# Patient Record
Sex: Male | Born: 1965 | Race: White | Hispanic: No | Marital: Married | Smoking: Never smoker
Health system: Southern US, Community
[De-identification: ages and names within clinical notes are randomized; demographics above are authoritative.]

## PROBLEM LIST (undated history)

## (undated) DIAGNOSIS — E119 Type 2 diabetes mellitus without complications: Secondary | ICD-10-CM

## (undated) HISTORY — PX: FINGER AMPUTATION: SHX636

## (undated) HISTORY — DX: Type 2 diabetes mellitus without complications: E11.9

---

## 2018-03-26 ENCOUNTER — Emergency Department (HOSPITAL_COMMUNITY)
Admission: EM | Admit: 2018-03-26 | Discharge: 2018-03-26 | Disposition: A | Discharge status: Home / Self Care | Payer: BC Managed Care – PPO | Attending: Emergency Medicine | Admitting: Emergency Medicine

## 2018-03-26 ENCOUNTER — Encounter (HOSPITAL_COMMUNITY): Payer: Self-pay | Admitting: Emergency Medicine

## 2018-03-26 ENCOUNTER — Emergency Department (HOSPITAL_COMMUNITY): Payer: BC Managed Care – PPO

## 2018-03-26 ENCOUNTER — Other Ambulatory Visit: Payer: Self-pay

## 2018-03-26 DIAGNOSIS — J069 Acute upper respiratory infection, unspecified: Secondary | ICD-10-CM | POA: Insufficient documentation

## 2018-03-26 DIAGNOSIS — R000 Tachycardia, unspecified: Secondary | ICD-10-CM | POA: Diagnosis not present

## 2018-03-26 DIAGNOSIS — R05 Cough: Secondary | ICD-10-CM | POA: Diagnosis not present

## 2018-03-26 LAB — CBC WITH DIFFERENTIAL/PLATELET
BASOS ABS: 0 10*3/uL (ref 0.0–0.1)
BASOS PCT: 0 %
Eosinophils Absolute: 0.1 10*3/uL (ref 0.0–0.7)
Eosinophils Relative: 1 %
HCT: 45.9 % (ref 39.0–52.0)
Hemoglobin: 15.5 g/dL (ref 13.0–17.0)
LYMPHS PCT: 8 %
Lymphs Abs: 0.6 10*3/uL — ABNORMAL LOW (ref 0.7–4.0)
MCH: 30.2 pg (ref 26.0–34.0)
MCHC: 33.8 g/dL (ref 30.0–36.0)
MCV: 89.5 fL (ref 78.0–100.0)
MONO ABS: 0.2 10*3/uL (ref 0.1–1.0)
MONOS PCT: 2 %
Neutro Abs: 6.7 10*3/uL (ref 1.7–7.7)
Neutrophils Relative %: 89 %
Platelets: 262 10*3/uL (ref 150–400)
RBC: 5.13 MIL/uL (ref 4.22–5.81)
RDW: 12 % (ref 11.5–15.5)
WBC: 7.6 10*3/uL (ref 4.0–10.5)

## 2018-03-26 LAB — COMPREHENSIVE METABOLIC PANEL
ALBUMIN: 4.6 g/dL (ref 3.5–5.0)
ALK PHOS: 103 U/L (ref 38–126)
ALT: 34 U/L (ref 17–63)
AST: 25 U/L (ref 15–41)
Anion gap: 11 (ref 5–15)
BILIRUBIN TOTAL: 1.3 mg/dL — AB (ref 0.3–1.2)
BUN: 18 mg/dL (ref 6–20)
CO2: 24 mmol/L (ref 22–32)
Calcium: 9.7 mg/dL (ref 8.9–10.3)
Chloride: 101 mmol/L (ref 101–111)
Creatinine, Ser: 1.21 mg/dL (ref 0.61–1.24)
GFR calc Af Amer: >60 mL/min (ref >60)
GFR calc non Af Amer: >60 mL/min (ref >60)
GLUCOSE: 183 mg/dL — AB (ref 65–99)
Potassium: 4.2 mmol/L (ref 3.5–5.1)
Sodium: 136 mmol/L (ref 135–145)
TOTAL PROTEIN: 8.3 g/dL — AB (ref 6.5–8.1)

## 2018-03-26 LAB — TROPONIN I: Troponin I: <0.03 ng/mL (ref <0.03)

## 2018-03-26 MED ORDER — SODIUM CHLORIDE 0.9 % IV BOLUS
1000.0000 mL | Freq: Once | INTRAVENOUS | Status: AC
  Administered 2018-03-26: 1000 mL via INTRAVENOUS

## 2018-03-26 MED ORDER — ACETAMINOPHEN 500 MG PO TABS
1000.0000 mg | ORAL_TABLET | Freq: Once | ORAL | Status: AC
  Administered 2018-03-26: 1000 mg via ORAL
  Filled 2018-03-26: qty 2

## 2018-03-26 MED ORDER — BENZONATATE 100 MG PO CAPS: 100.0000 mg | ORAL_CAPSULE | Freq: Three times a day (TID) | ORAL | 0 refills | Status: DC | PRN

## 2018-03-26 NOTE — Discharge Instructions (Signed)

## 2018-03-26 NOTE — ED Provider Notes (Signed)
Emergency Department Provider Note   I have reviewed the triage vital signs and the nursing notes.   HISTORY  Chief Complaint Tachycardia and Cough   HPI David [name] is a 52 y.o. male presents to the emergency department for evaluation of cough, congestion, and persistent elevated heart rate.  He has had symptoms for the past 4 days.  He is been taking over-the-counter medications including Sudafed and DayQuil with only intermittent relief in symptoms.  He went to see his workplace provider who referred him to the emergency department.  He went to see his PCP Dr. Juanetta Gosling who also referred him to the emergency department.  He denies any dyspnea, shaking chills, chest pain, abdominal pain, vomiting, diarrhea.  No hemoptysis. No radiation of symptoms or modifying factors.   History reviewed. No pertinent past medical history.  There are no active problems to display for this patient.   Past Surgical History:  Procedure Laterality Date  . FINGER AMPUTATION        Allergies Patient has no known allergies.  History reviewed. No pertinent family history.  Social History Social History   Tobacco Use  . Smoking status: Never Smoker  . Smokeless tobacco: Never Used  Substance Use Topics  . Alcohol use: Not Currently    Frequency: Never  . Drug use: Never    Review of Systems  Constitutional: No fever/chills Eyes: No visual changes. ENT: Positive sore throat. Positive cough and congestion.  Cardiovascular: Denies chest pain. Respiratory: Denies shortness of breath. Gastrointestinal: No abdominal pain.  No nausea, no vomiting.  No diarrhea.  No constipation. Genitourinary: Negative for dysuria. Musculoskeletal: Negative for back pain. Skin: Negative for rash. Neurological: Negative for headaches, focal weakness or numbness.  10-point ROS otherwise negative.  ____________________________________________   PHYSICAL EXAM:  VITAL SIGNS: ED Triage Vitals  Enc  Vitals Group     BP 03/26/18 1621 (!) 178/96     Pulse Rate 03/26/18 1621 (!) 126     Resp 03/26/18 1621 18     Temp 03/26/18 1621 99.3 F (37.4 C)     Temp Source 03/26/18 1621 Oral     SpO2 03/26/18 1621 95 %     Weight 03/26/18 1621 184 lb (83.5 kg)     Height 03/26/18 1621 5\' 8"  (1.727 m)     Pain Score 03/26/18 1623 4   Constitutional: Alert and oriented. Well appearing and in no acute distress. Eyes: Conjunctivae are normal. Head: Atraumatic. Nose: No congestion/rhinnorhea. Mouth/Throat: Mucous membranes are moist.  Neck: No stridor.  Cardiovascular: Sinus tachcyardia. Good peripheral circulation. Grossly normal heart sounds.   Respiratory: Normal respiratory effort.  No retractions. Lungs CTAB. Gastrointestinal: Soft and nontender. No distention.  Musculoskeletal: No lower extremity tenderness nor edema. No gross deformities of extremities. Neurologic:  Normal speech and language. No gross focal neurologic deficits are appreciated.  Skin:  Skin is warm, dry and intact. No rash noted.  ____________________________________________   LABS (all labs ordered are listed, but only abnormal results are displayed)  Labs Reviewed  COMPREHENSIVE METABOLIC PANEL - Abnormal; Notable for the following components:      Result Value   Glucose, Bld 183 (*)    Total Protein 8.3 (*)    Total Bilirubin 1.3 (*)    All other components within normal limits  CBC WITH DIFFERENTIAL/PLATELET - Abnormal; Notable for the following components:   Lymphs Abs 0.6 (*)    All other components within normal limits  TROPONIN I   ____________________________________________  EKG   EKG Interpretation  Date/Time:  Wednesday March 26 2018 16:30:38 EDT Ventricular Rate:  110 PR Interval:    QRS Duration: 99 QT Interval:  308 QTC Calculation: 417 R Axis:   61 Text Interpretation:  Sinus tachycardia No STEMI.  Confirmed by Alona BeneLong, Demetre Monaco [phone]([id number]) on 03/26/2018 4:32:41 PM        ____________________________________________  RADIOLOGY  Dg Chest 2 View  Result Date: 03/26/2018 CLINICAL DATA:  Tachycardia, productive yellow/green sputum cough with body aches and generalized malaise and low grade fevers x 3 days. Denies sob or chest pain.No prior history of heart or lung conditions. EXAM: CHEST - 2 VIEW COMPARISON:  None. FINDINGS: The heart size and mediastinal contours are within normal limits. Both lungs are clear. No pleural effusion or pneumothorax. The visualized skeletal structures are unremarkable. IMPRESSION: No active cardiopulmonary disease. Electronically Signed   By: Amie Portlandavid  Ormond M.D.   On: 03/26/2018 17:07    ____________________________________________   PROCEDURES  Procedure(s) performed:   Procedures  None ____________________________________________   INITIAL IMPRESSION / ASSESSMENT AND PLAN / ED COURSE  Pertinent labs & imaging results that were available during my care of the patient were reviewed by me and considered in my medical decision making (see chart for details).  Patient presents to the emergency department for evaluation of upper respiratory tract infection symptoms with persistent tachycardia.  He has been taking Sudafed and DayQuil for the past 3 days which may be contributing to his tachycardia.  He shows no signs of volume overload or heart findings to suggest myocarditis.  No chest pain.  EKG reviewed with no acute findings.  Plan for chest x-ray and lab work along with IV fluids. Afebrile here.   05:32 PM Labs and CXR reviewed with no acute findings. Troponin negative. No clinical concern for myocarditis. Plan to have the patient decrease his OTC medication with psuedophed and f/u with PCP.   At this time, I do not feel there is any life-threatening condition present. I have reviewed and discussed all results (EKG, imaging, lab, urine as appropriate), exam findings with patient. I have reviewed nursing notes and  appropriate previous records.  I feel the patient is safe to be discharged home without further emergent workup. Discussed usual and customary return precautions. Patient and family (if present) verbalize understanding and are comfortable with this plan.  Patient will follow-up with their primary care provider. If they do not have a primary care provider, information for follow-up has been provided to them. All questions have been answered.  ____________________________________________  FINAL CLINICAL IMPRESSION(S) / ED DIAGNOSES  Final diagnoses:  Tachycardia  Viral upper respiratory tract infection     MEDICATIONS GIVEN DURING THIS VISIT:  Medications  sodium chloride 0.9 % bolus 1,000 mL (1,000 mLs Intravenous New Bag/Given 03/26/18 1643)  acetaminophen (TYLENOL) tablet 1,000 mg (1,000 mg Oral Given 03/26/18 1643)     NEW OUTPATIENT MEDICATIONS STARTED DURING THIS VISIT:  New Prescriptions   BENZONATATE (TESSALON) 100 MG CAPSULE    Take 1 capsule (100 mg total) by mouth 3 (three) times daily as needed for cough.    Note:  This document was prepared using Dragon voice recognition software and may include unintentional dictation errors.  Alona BeneJoshua Karver Fadden, MD Emergency Medicine    Richardo Popoff, Arlyss RepressJoshua G, MD 03/26/18 [phone]

## 2018-03-26 NOTE — ED Triage Notes (Signed)
PT c/o productive yellow/green sputum cough with body aches and generalized malaise and low grade fevers. PT states he was seen by her work MD today and then Dr. Juanetta GoslingHawkins and was told to come to ED for eval due to tachycardia.

## 2019-08-04 LAB — BASIC METABOLIC PANEL: Glucose: 120

## 2019-08-04 LAB — HEPATIC FUNCTION PANEL
ALT: 22 (ref 10–40)
AST: 17 (ref 14–40)
Alkaline Phosphatase: 80 (ref 25–125)

## 2019-08-04 LAB — PSA: PSA: 2

## 2019-08-07 LAB — LIPID PANEL
Cholesterol: 217 — AB (ref 0–200)
LDL Cholesterol: 151
Triglycerides: 136 (ref 40–160)

## 2019-08-26 ENCOUNTER — Ambulatory Visit (INDEPENDENT_AMBULATORY_CARE_PROVIDER_SITE_OTHER): Payer: PRIVATE HEALTH INSURANCE | Admitting: Family Medicine

## 2019-08-26 ENCOUNTER — Other Ambulatory Visit: Payer: Self-pay

## 2019-08-26 VITALS — BP 132/87 | HR 82 | Temp 98.1°F | Ht 68.0 in | Wt 182.2 lb

## 2019-08-26 DIAGNOSIS — G4709 Other insomnia: Secondary | ICD-10-CM

## 2019-08-26 DIAGNOSIS — R7309 Other abnormal glucose: Secondary | ICD-10-CM

## 2019-08-26 DIAGNOSIS — Z23 Encounter for immunization: Secondary | ICD-10-CM

## 2019-08-26 LAB — POCT GLYCOSYLATED HEMOGLOBIN (HGB A1C): Hemoglobin A1C: 5.9 % — AB (ref 4.0–5.6)

## 2019-08-26 NOTE — Patient Instructions (Signed)

## 2019-08-26 NOTE — Progress Notes (Signed)
New Patient Office Visit  Subjective:  Patient ID: David [name], male    DOB: 08-17-1966  Age: 53 y.o. MRN: [id number]  CC:  Chief Complaint  Patient presents with  . New Patient (Initial Visit)    HPI David [name] presents for  cologuard-07/2019 PSA 2.0 normal Elevated PSA in the past with biopsy-normal  Past Surgical History:  Procedure Laterality Date  . FINGER AMPUTATION    right finger  FH -father hyperlipidemia  Social History  Live with wife and son( in college)), 2 other adult children Police officer-retired [employer] Socioeconomic History  . Marital status: Married    Spouse name: Not on file  . Number of children: Not on file  . Years of education: Not on file  . Highest education level: Not on file  Occupational History  . Not on file  Social Needs  . Financial resource strain: Not on file  . Food insecurity    Worry: Not on file    Inability: Not on file  . Transportation needs    Medical: Not on file    Non-medical: Not on file  Tobacco Use  . Smoking status: Never Smoker  . Smokeless tobacco: Never Used  Substance and Sexual Activity  . Alcohol use: Not Currently    Frequency: Never  . Drug use: Never  . Sexual activity: Not on file  Lifestyle  . Physical activity    Days per week: Not on file    Minutes per session: Not on file  . Stress: Not on file  Relationships  . Social Herbalist on phone: Not on file    Gets together: Not on file    Attends religious service: Not on file    Active member of club or organization: Not on file    Attends meetings of clubs or organizations: Not on file    Relationship status: Not on file  . Intimate partner violence    Fear of current or ex partner: Not on file    Emotionally abused: Not on file    Physically abused: Not on file    Forced sexual activity: Not on file  Other Topics Concern  . Not on file  Social History Narrative  . Not on file    ROS Review of Systems   HENT: Negative.   Eyes: Negative.   Respiratory: Negative.   Cardiovascular: Negative.   Endocrine: Negative.   Genitourinary:       Elevated psa  Musculoskeletal:       Back pain  Psychiatric/Behavioral: Positive for sleep disturbance.       Occasionally uses ambien    Objective:   Today's Vitals: BP 132/87 (BP Location: Left Arm, Patient Position: Sitting, Cuff Size: Normal)   Pulse 82   Temp 98.1 F (36.7 C) (Oral)   Ht 5\' 8"  (1.727 m)   Wt 182 lb 3.2 oz (82.6 kg)   SpO2 97%   BMI 27.70 kg/m   Physical Exam Constitutional:      Appearance: Normal appearance.  HENT:     Head: Normocephalic and atraumatic.     Right Ear: Tympanic membrane normal.     Left Ear: Tympanic membrane normal.     Nose: Nose normal.  Eyes:     Conjunctiva/sclera: Conjunctivae normal.  Neck:     Musculoskeletal: Normal range of motion and neck supple.  Cardiovascular:     Rate and Rhythm: Normal rate and regular rhythm.     Pulses:  Normal pulses.     Heart sounds: Normal heart sounds.  Pulmonary:     Effort: Pulmonary effort is normal.     Breath sounds: Normal breath sounds.  Abdominal:     General: Abdomen is flat.  Neurological:     General: No focal deficit present.  Psychiatric:        Mood and Affect: Mood normal.        Behavior: Behavior normal.     Assessment & Plan:   1. Elevated glucose - POCT HgB A1C  2. Other insomnia ambien prn 3. Need for immunization against influenza  - Flu Vaccine QUAD 36+ mos IM Outpatient Encounter Medications as of 08/26/2019  Medication Sig  . baclofen (LIORESAL) 10 MG tablet Take 10 mg by mouth 3 (three) times daily.  Marland Kitchen. ibuprofen (ADVIL) 800 MG tablet Take 800 mg by mouth every 8 (eight) hours as needed.  . zolpidem (AMBIEN) 5 MG tablet Take 5 mg by mouth at bedtime as needed for sleep.  . [DISCONTINUED] benzonatate (TESSALON) 100 MG capsule Take 1 capsule (100 mg total) by mouth 3 (three) times daily as needed for cough. (Patient  not taking: Reported on 08/26/2019)   No facility-administered encounter medications on file as of 08/26/2019.    Follow-up:   Jonni Oelkers Mat CarneLEIGH Brylan Dec, MD

## 2019-10-08 IMAGING — DX DG CHEST 2V
2 series · 2 of 2 positions shown · non-contrast
Comparison: None.

CLINICAL DATA: Tachycardia, productive yellow/green sputum cough
with body aches and generalized malaise and low grade fevers x 3
days. Denies sob or chest pain.No prior history of heart or lung
conditions.

EXAM:
CHEST - 2 VIEW

[chest pa]
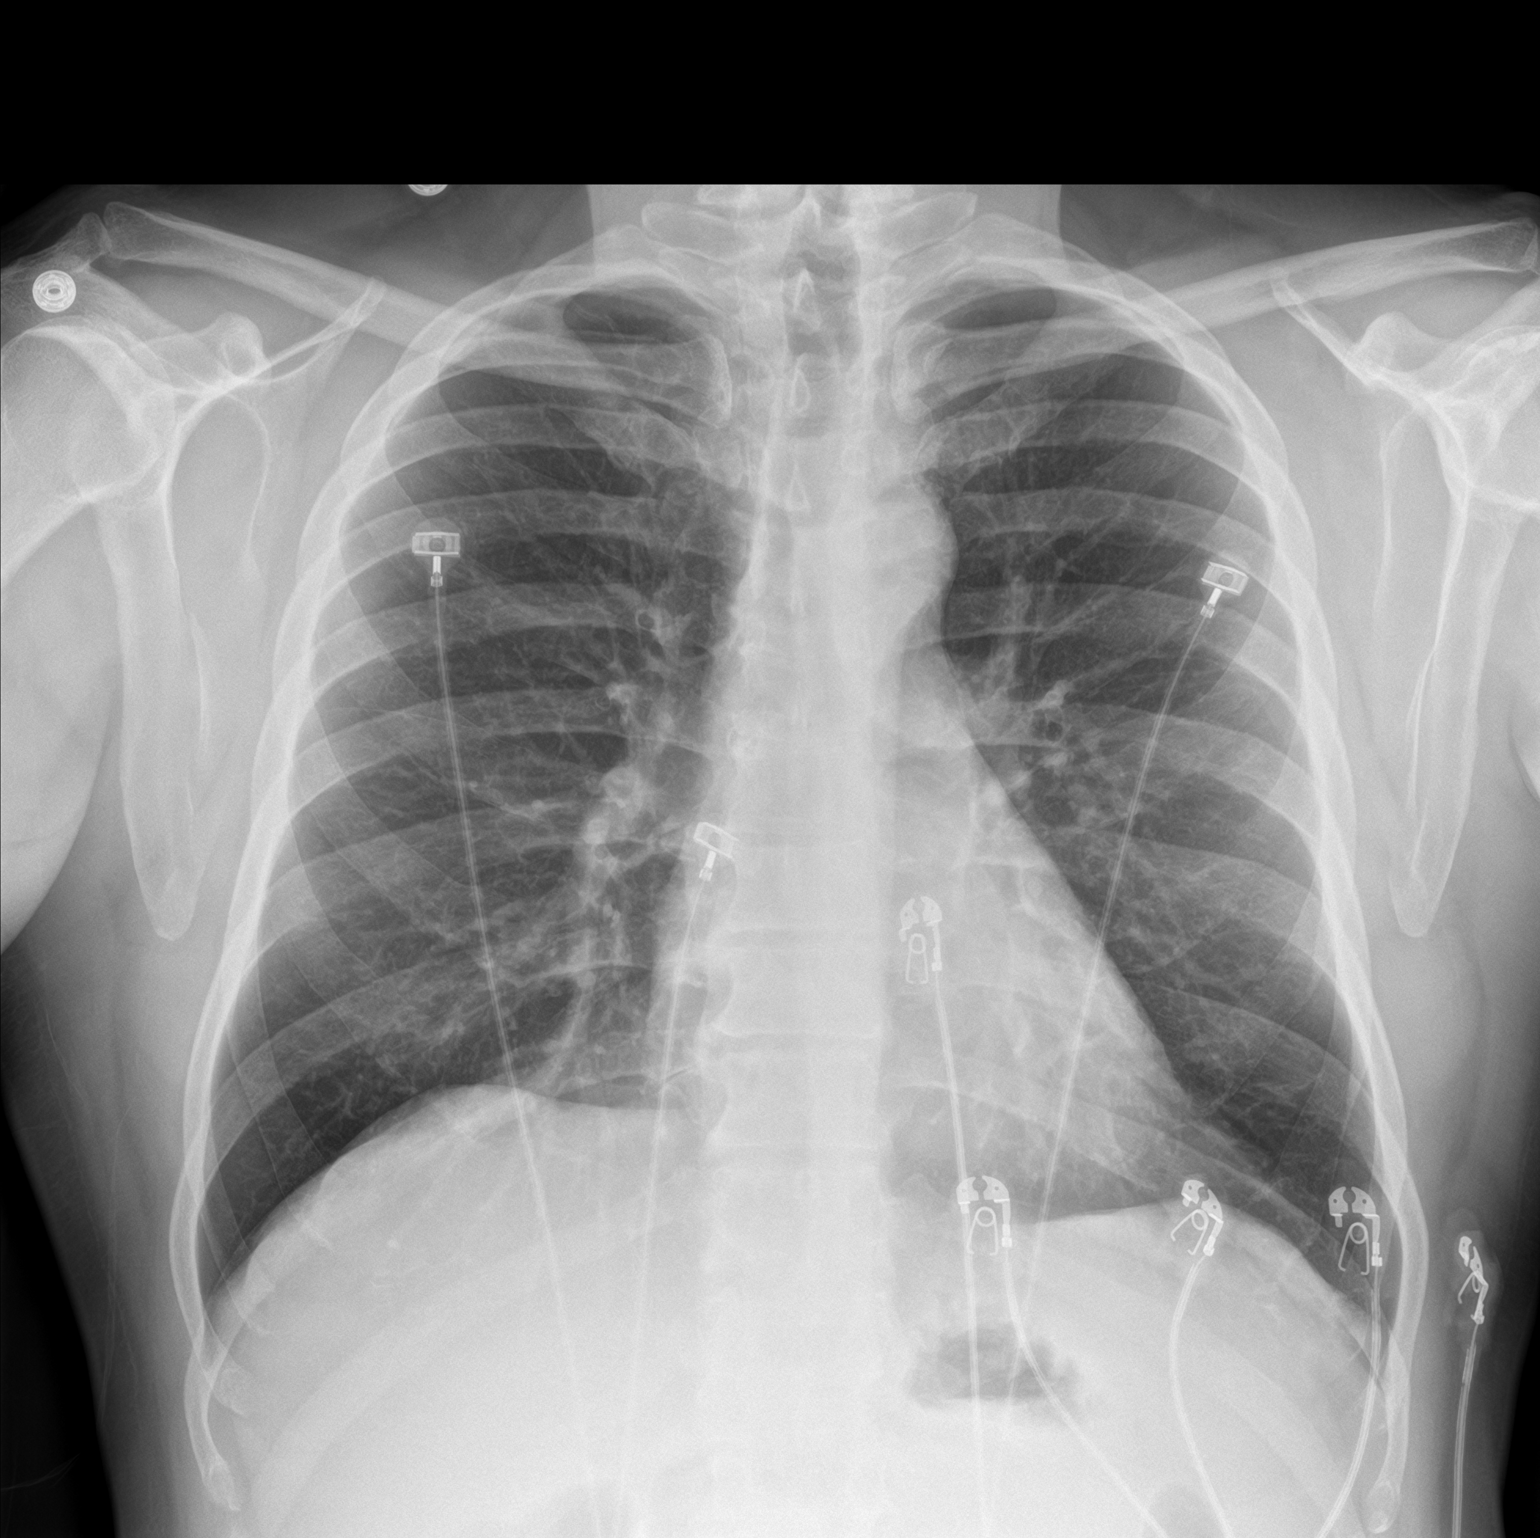

[chest lat]
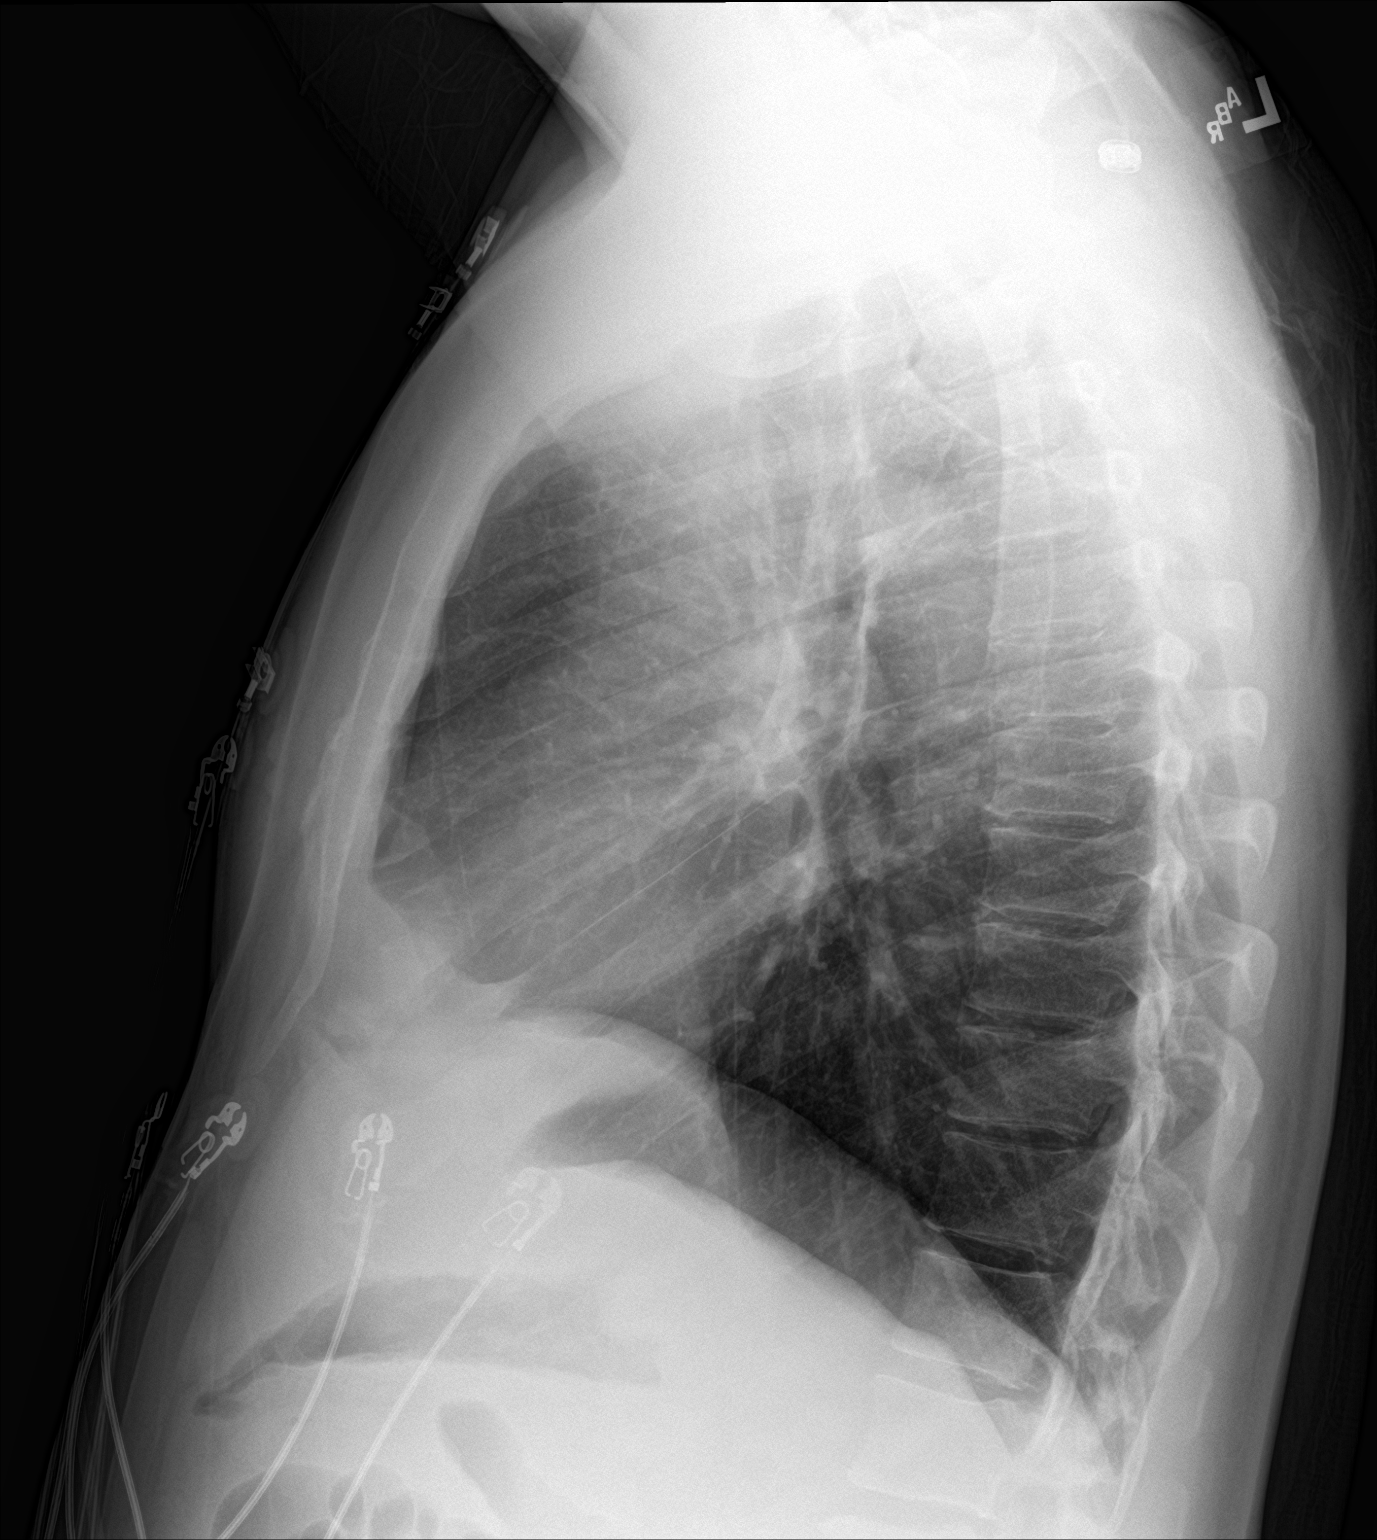

[2 of 2 positions shown; findings below may reference images not displayed]

FINDINGS: The heart size and mediastinal contours are within normal limits.
Both lungs are clear. No pleural effusion or pneumothorax. The
visualized skeletal structures are unremarkable.
IMPRESSION: No active cardiopulmonary disease.

## 2020-02-23 ENCOUNTER — Ambulatory Visit: Payer: BC Managed Care – PPO | Admitting: Family Medicine

## 2021-02-15 ENCOUNTER — Other Ambulatory Visit: Payer: Self-pay | Admitting: *Deleted

## 2021-02-15 DIAGNOSIS — Z1231 Encounter for screening mammogram for malignant neoplasm of breast: Secondary | ICD-10-CM

## 2021-02-27 ENCOUNTER — Other Ambulatory Visit: Payer: Self-pay

## 2021-02-27 DIAGNOSIS — Z125 Encounter for screening for malignant neoplasm of prostate: Secondary | ICD-10-CM

## 2021-02-28 ENCOUNTER — Other Ambulatory Visit: Payer: Self-pay

## 2021-02-28 ENCOUNTER — Other Ambulatory Visit: Payer: Self-pay | Admitting: *Deleted

## 2021-02-28 DIAGNOSIS — Z125 Encounter for screening for malignant neoplasm of prostate: Secondary | ICD-10-CM

## 2021-03-01 ENCOUNTER — Telehealth: Payer: Self-pay

## 2021-03-01 LAB — PSA: Prostate Specific Ag, Serum: 1.9 ng/mL

## 2021-03-02 ENCOUNTER — Telehealth: Payer: Self-pay

## 2023-02-27 ENCOUNTER — Encounter: Payer: Self-pay | Admitting: *Deleted

## 2023-03-07 ENCOUNTER — Telehealth (INDEPENDENT_AMBULATORY_CARE_PROVIDER_SITE_OTHER): Payer: Self-pay | Admitting: *Deleted

## 2023-03-07 NOTE — Telephone Encounter (Signed)
  Procedure: Colonoscopy  Height: 5'8 Weight: 158lbs       Have you had a colonoscopy before?  no  Do you have family history of colon cancer?  no  Do you have a family history of polyps? no  Previous colonoscopy with polyps removed? no  Do you have a history colorectal cancer?   no  Are you diabetic?  Yes, type 2  Do you have a prosthetic or mechanical heart valve? no  Do you have a pacemaker/defibrillator?   no  Have you had endocarditis/atrial fibrillation?  no  Do you use supplemental oxygen/CPAP?  no  Have you had joint replacement within the last 12 months?  no  Do you tend to be constipated or have to use laxatives?  no   Do you have history of alcohol use? If yes, how much and how often.  no  Do you have history or are you using drugs? If yes, what do are you  using?  no  Have you ever had a stroke/heart attack?  no  Have you ever had a heart or other vascular stent placed,?no  Do you take weight loss medication? no  Do you take any blood-thinning medications such as: (Plavix, aspirin, Coumadin, Aggrenox, Brilinta, Xarelto, Eliquis, Pradaxa, Savaysa or Effient)? no  If yes we need the name, milligram, dosage and who is prescribing doctor:               Current Outpatient Medications  Medication Sig Dispense Refill   dapagliflozin propanediol (FARXIGA) 5 MG TABS tablet Take 5 mg by mouth daily.     metFORMIN (GLUCOPHAGE-XR) 500 MG 24 hr tablet Take 500 mg by mouth daily with breakfast.     ibuprofen (ADVIL) 800 MG tablet Take by mouth.     No current facility-administered medications for this visit.    No Known Allergies

## 2023-04-01 NOTE — Telephone Encounter (Signed)
LMOVM to call back 

## 2023-04-01 NOTE — Telephone Encounter (Signed)
Okay to schedule.  ASA 2.  Hold Farxiga x 3 days prior.  May continue to take metformin as prescribed.  Day of procedure: Do not take any morning diabetes medications.

## 2023-04-04 NOTE — Telephone Encounter (Signed)
Letter mailed

## 2023-04-24 ENCOUNTER — Emergency Department (HOSPITAL_COMMUNITY): Payer: BC Managed Care – PPO

## 2023-04-24 ENCOUNTER — Encounter (HOSPITAL_COMMUNITY): Payer: Self-pay

## 2023-04-24 ENCOUNTER — Other Ambulatory Visit: Payer: Self-pay

## 2023-04-24 ENCOUNTER — Encounter (HOSPITAL_COMMUNITY): Payer: Self-pay | Admitting: *Deleted

## 2023-04-24 ENCOUNTER — Emergency Department (HOSPITAL_COMMUNITY)
Admission: EM | Admit: 2023-04-24 | Discharge: 2023-04-24 | Disposition: A | Discharge status: Home / Self Care | Payer: BC Managed Care – PPO | Attending: Emergency Medicine | Admitting: Emergency Medicine

## 2023-04-24 DIAGNOSIS — R1011 Right upper quadrant pain: Secondary | ICD-10-CM

## 2023-04-24 DIAGNOSIS — E119 Type 2 diabetes mellitus without complications: Secondary | ICD-10-CM | POA: Insufficient documentation

## 2023-04-24 DIAGNOSIS — Z7984 Long term (current) use of oral hypoglycemic drugs: Secondary | ICD-10-CM | POA: Insufficient documentation

## 2023-04-24 DIAGNOSIS — R0602 Shortness of breath: Secondary | ICD-10-CM | POA: Insufficient documentation

## 2023-04-24 DIAGNOSIS — R160 Hepatomegaly, not elsewhere classified: Secondary | ICD-10-CM

## 2023-04-24 LAB — HEPATIC FUNCTION PANEL
ALT: 25 U/L (ref 0–44)
AST: 32 U/L (ref 15–41)
Albumin: 4.6 g/dL (ref 3.5–5.0)
Alkaline Phosphatase: 104 U/L (ref 38–126)
Bilirubin, Direct: 0.2 mg/dL (ref 0.0–0.2)
Indirect Bilirubin: 0.9 mg/dL (ref 0.3–0.9)
Total Bilirubin: 1.1 mg/dL (ref 0.3–1.2)
Total Protein: 8.2 g/dL — ABNORMAL HIGH (ref 6.5–8.1)

## 2023-04-24 LAB — CBC WITH DIFFERENTIAL/PLATELET
Abs Immature Granulocytes: 0.03 10*3/uL (ref 0.00–0.07)
Basophils Absolute: 0 10*3/uL (ref 0.0–0.1)
Basophils Relative: 0 %
Eosinophils Absolute: 0.1 10*3/uL (ref 0.0–0.5)
Eosinophils Relative: 1 %
HCT: 44.4 % (ref 39.0–52.0)
Hemoglobin: 15 g/dL (ref 13.0–17.0)
Immature Granulocytes: 0 %
Lymphocytes Relative: 13 %
Lymphs Abs: 1.2 10*3/uL (ref 0.7–4.0)
MCH: 30.4 pg (ref 26.0–34.0)
MCHC: 33.8 g/dL (ref 30.0–36.0)
MCV: 89.9 fL (ref 80.0–100.0)
Monocytes Absolute: 0.8 10*3/uL (ref 0.1–1.0)
Monocytes Relative: 8 %
Neutro Abs: 7.1 10*3/uL (ref 1.7–7.7)
Neutrophils Relative %: 78 %
Platelets: 259 10*3/uL (ref 150–400)
RBC: 4.94 MIL/uL (ref 4.22–5.81)
RDW: 12.3 % (ref 11.5–15.5)
WBC: 9.3 10*3/uL (ref 4.0–10.5)
nRBC: 0 % (ref 0.0–0.2)

## 2023-04-24 LAB — URINALYSIS, ROUTINE W REFLEX MICROSCOPIC
Bacteria, UA: NONE SEEN
Bilirubin Urine: NEGATIVE
Glucose, UA: NEGATIVE mg/dL
Hgb urine dipstick: NEGATIVE
Ketones, ur: NEGATIVE mg/dL
Leukocytes,Ua: NEGATIVE
Nitrite: NEGATIVE
Protein, ur: 30 mg/dL — AB
Specific Gravity, Urine: 1.024 (ref 1.005–1.030)
pH: 6 (ref 5.0–8.0)

## 2023-04-24 LAB — BASIC METABOLIC PANEL
Anion gap: 9 (ref 5–15)
BUN: 18 mg/dL (ref 6–20)
CO2: 27 mmol/L (ref 22–32)
Calcium: 9.8 mg/dL (ref 8.9–10.3)
Chloride: 101 mmol/L (ref 98–111)
Creatinine, Ser: 1.05 mg/dL (ref 0.61–1.24)
GFR, Estimated: >60 mL/min (ref >60)
Glucose, Bld: 198 mg/dL — ABNORMAL HIGH (ref 70–99)
Potassium: 4.4 mmol/L (ref 3.5–5.1)
Sodium: 137 mmol/L (ref 135–145)

## 2023-04-24 LAB — D-DIMER, QUANTITATIVE: D-Dimer, Quant: 0.31 ug/mL-FEU (ref 0.00–0.50)

## 2023-04-24 LAB — LIPASE, BLOOD: Lipase: 35 U/L (ref 11–51)

## 2023-04-24 LAB — BRAIN NATRIURETIC PEPTIDE: B Natriuretic Peptide: 35 pg/mL (ref 0.0–100.0)

## 2023-04-24 LAB — TROPONIN I (HIGH SENSITIVITY): Troponin I (High Sensitivity): <2 ng/L (ref <18)

## 2023-04-24 MED ORDER — ONDANSETRON HCL 4 MG/2ML IJ SOLN
4.0000 mg | Freq: Once | INTRAMUSCULAR | Status: AC
  Administered 2023-04-24: 4 mg via INTRAVENOUS
  Filled 2023-04-24: qty 2

## 2023-04-24 MED ORDER — MORPHINE SULFATE (PF) 4 MG/ML IV SOLN
4.0000 mg | Freq: Once | INTRAVENOUS | Status: AC
  Administered 2023-04-24: 4 mg via INTRAVENOUS
  Filled 2023-04-24: qty 1

## 2023-04-24 MED ORDER — IOHEXOL 350 MG/ML SOLN
75.0000 mL | Freq: Once | INTRAVENOUS | Status: AC | PRN
  Administered 2023-04-24: 75 mL via INTRAVENOUS

## 2023-04-24 MED ORDER — HYDROCODONE-ACETAMINOPHEN 5-325 MG PO TABS: 1.0000 | ORAL_TABLET | ORAL | 0 refills | Status: DC | PRN

## 2023-04-24 MED ORDER — HYDROCODONE-ACETAMINOPHEN 5-325 MG PO TABS: 2.0000 | ORAL_TABLET | ORAL | 0 refills | Status: DC | PRN

## 2023-04-24 MED ORDER — SODIUM CHLORIDE 0.9 % IV BOLUS
1000.0000 mL | Freq: Once | INTRAVENOUS | Status: AC
  Administered 2023-04-24: 1000 mL via INTRAVENOUS

## 2023-04-24 NOTE — ED Provider Notes (Signed)
[place] EMERGENCY DEPARTMENT AT [facility] Provider Note   CSN: [id number] Arrival date & time: 04/24/23  1435     History  Chief Complaint  Patient presents with   Shortness of Breath    David [name] is a 57 y.o. male.  Pt is a 57 yo male with pmhx significant for DM.  Pt is a Chartered certified accountant.  He had some pain and sob last night which he thought was heart burn.  He took some antacids, but that did not help.  He went to work today and had to leave and come here b/c of severe pain.  He said pain was worse with sob.  He did go to an OD last night and is not sure if he was exposed to anything.  Pt denies f/c.  No prior surgeries.       Home Medications Prior to Admission medications   Medication Sig Start Date End Date Taking? Authorizing Provider  baclofen (LIORESAL) 10 MG tablet Take 10 mg by mouth 3 (three) times daily as needed.   Yes [provider]  HYDROcodone-acetaminophen (NORCO/VICODIN) 5-325 MG tablet Take 1 tablet by mouth every 4 (four) hours as needed. 04/24/23  Yes Jacalyn Lefevre, MD  ibuprofen (ADVIL) 800 MG tablet Take by mouth.   Yes [provider]  metFORMIN (GLUCOPHAGE-XR) 500 MG 24 hr tablet Take 500 mg by mouth every evening. 02/18/23  Yes [provider]  valACYclovir (VALTREX) 500 MG tablet as needed.   Yes [provider]  zolpidem (AMBIEN) 10 MG tablet Take by mouth. 02/18/23  Yes [provider]  Continuous Glucose Sensor (DEXCOM G7 SENSOR) MISC USE AS DIRECTED TO CHECK GLUCOSE - CHANGE SENSOR EVERY 10 DAYS Patient not taking: Reported on 04/24/2023    [provider]  dapagliflozin propanediol (FARXIGA) 5 MG TABS tablet Take 5 mg by mouth daily. Patient not taking: Reported on 04/24/2023 02/18/23   [provider]      Allergies    Patient has no known allergies.    Review of Systems   Review of Systems  Respiratory:  Positive for shortness of breath.   All other systems  reviewed and are negative.   Physical Exam Updated Vital Signs BP (!) 149/83   Pulse 90   Temp 98.4 F (36.9 C) (Oral)   Resp (!) 22   Ht 5\' 8"  (1.727 m)   Wt 72.6 kg   SpO2 99%   BMI 24.33 kg/m  Physical Exam Vitals and nursing note reviewed.  Constitutional:      Appearance: He is well-developed.  HENT:     Head: Normocephalic and atraumatic.     Mouth/Throat:     Mouth: Mucous membranes are moist.     Pharynx: Oropharynx is clear.  Eyes:     Extraocular Movements: Extraocular movements intact.     Pupils: Pupils are equal, round, and reactive to light.  Cardiovascular:     Rate and Rhythm: Normal rate.  Pulmonary:     Effort: Pulmonary effort is normal.     Breath sounds: Normal breath sounds.  Abdominal:     Tenderness: There is abdominal tenderness in the right upper quadrant.  Musculoskeletal:        General: Normal range of motion.     Cervical back: Normal range of motion and neck supple.  Skin:    General: Skin is warm.     Capillary Refill: Capillary refill takes less than 2 seconds.  Neurological:  General: No focal deficit present.     Mental Status: He is alert and oriented to person, place, and time.  Psychiatric:        Mood and Affect: Mood normal.        Behavior: Behavior normal.     ED Results / Procedures / Treatments   Labs (all labs ordered are listed, but only abnormal results are displayed) Labs Reviewed  BASIC METABOLIC PANEL - Abnormal; Notable for the following components:      Result Value   Glucose, Bld 198 (*)    All other components within normal limits  HEPATIC FUNCTION PANEL - Abnormal; Notable for the following components:   Total Protein 8.2 (*)    All other components within normal limits  URINALYSIS, ROUTINE W REFLEX MICROSCOPIC - Abnormal; Notable for the following components:   Protein, ur 30 (*)    All other components within normal limits  BRAIN NATRIURETIC PEPTIDE  CBC WITH DIFFERENTIAL/PLATELET  LIPASE,  BLOOD  D-DIMER, QUANTITATIVE (NOT AT [facility])  TROPONIN I (HIGH SENSITIVITY)    EKG EKG Interpretation  Date/Time:  Wednesday Apr 24 2023 15:01:22 EDT Ventricular Rate:  89 PR Interval:  163 QRS Duration: 80 QT Interval:  323 QTC Calculation: 393 R Axis:   38 Text Interpretation: Sinus rhythm Probable left atrial enlargement no stemi Confirmed by Jacalyn Lefevre [id number]) on 04/24/2023 3:13:12 PM  Radiology CT ABDOMEN PELVIS WO CONTRAST  Result Date: 04/24/2023 CLINICAL DATA:  Liver mass on ultrasound. EXAM: CT ABDOMEN AND PELVIS WITHOUT CONTRAST TECHNIQUE: Multidetector CT imaging of the abdomen and pelvis was performed following the standard protocol without IV contrast. RADIATION DOSE REDUCTION: This exam was performed according to the departmental dose-optimization program which includes automated exposure control, adjustment of the mA and/or kV according to patient size and/or use of iterative reconstruction technique. COMPARISON:  Ultrasound and CT angiogram chest earlier 04/24/2023 FINDINGS: Lower chest: There is some linear opacity lung bases likely scar or atelectasis. No pleural effusion. Small right-sided pre cardiophrenic lymph nodes. Nonpathologic by size criteria but more numerous than usually seen. Hepatobiliary: There is contrast seen in the liver and parenchymal organs from the patient's prior contrast administration for the CTA of the chest. Once again there is a heterogeneous mass centered in the dome of the liver involving segments 8 and 4 in particular measuring up to 8.2 cm. This has poorly defined due to the contrast but has a aggressive features. There are additional lesions identified elsewhere in segment 4. Focus in segment 4B on series 2 image 24 measures 2.1 cm and more lateral on image 25 on the opposite side of the gallbladder fossa measuring 19 mm. Multifocal aggressive lesions. Please correlate for any history of known malignancy. Pancreas: Unremarkable. No pancreatic  ductal dilatation or surrounding inflammatory changes. Spleen: Normal in size without focal abnormality. Adrenals/Urinary Tract: The adrenal glands are preserved. Parapelvic renal cysts. No collecting system dilatation. Evaluation for stones is limited as there is contrast throughout the renal collecting system, ureters and bladder. Preserved contours of the urinary bladder. Stomach/Bowel: Stomach is mildly distended with moderate luminal debris. On this noncontrast exam the small bowel has a normal course and caliber. No dilatation. Large bowel grossly has a normal course and caliber. There are diverticula along the sigmoid colon. Moderate colonic stool. Normal appendix in the right lower quadrant inferior to the cecum. Vascular/Lymphatic: Normal caliber aorta and IVC with mild atherosclerotic calcifications. There are a few prominent upper abdominal nodes identified in the portacaval space.  Example on series 2, image 28 measures 3.1 by 0.9 cm. Small nodes in the porta hepatis. Reproductive: Prominent prostate. Other: No free air or free fluid. Proved few prominent mesenteric vessels identified. Musculoskeletal: Scattered degenerative changes of the spine and pelvis. Bridging osteophytes along the sacroiliac joints. IMPRESSION: Multiple liver lesions are again identified which again have aggressive appearance but are poorly defined on this noncontrast examination after previous contrast administration for CT of the chest. Additional prominent nodes in the upper abdomen. Again the changes are worrisome for malignancy. Overall if additional radiologic evaluation is indicated, a repeat study with dynamic contrast after clearance of the current contrast administration may be of some benefit to assess for a subtle lesion in other organs as well as the better define these liver lesions. Diffuse colonic stool without obstruction. Normal appendix. Colonic diverticula. Electronically Signed   By: Karen Kays M.D.   On:  04/24/2023 17:51   US Abdomen Limited RUQ (LIVER/GB)  Result Date: 04/24/2023 CLINICAL DATA:  Right upper quadrant abdominal pain for 1 week EXAM: ULTRASOUND ABDOMEN LIMITED RIGHT UPPER QUADRANT COMPARISON:  None Available. FINDINGS: Gallbladder: No gallstones or wall thickening visualized. No sonographic Murphy sign noted by sonographer. Common bile duct: Diameter: 0.4 cm Liver: Large, circumscribed mass of the liver dome measuring 8.1 x 7.0 x 5.8 cm. Within normal limits in parenchymal echogenicity. Portal vein is patent on color Doppler imaging with normal direction of blood flow towards the liver. Other: None. IMPRESSION: 1. Large, circumscribed mass of the liver dome measuring 8.1 x 7.0 x 5.8 cm. Recommend further evaluation with liver protocol MRI or CT. 2.  No acute findings to explain pain. Electronically Signed   By: Jearld Lesch M.D.   On: 04/24/2023 17:13   CT Angio Chest PE W and/or Wo Contrast  Result Date: 04/24/2023 CLINICAL DATA:  Right-sided chest pain with shortness of breath and dyspnea. EXAM: CT ANGIOGRAPHY CHEST WITH CONTRAST TECHNIQUE: Multidetector CT imaging of the chest was performed using the standard protocol during bolus administration of intravenous contrast. Multiplanar CT image reconstructions and MIPs were obtained to evaluate the vascular anatomy. RADIATION DOSE REDUCTION: This exam was performed according to the departmental dose-optimization program which includes automated exposure control, adjustment of the mA and/or kV according to patient size and/or use of iterative reconstruction technique. CONTRAST:  75mL OMNIPAQUE IOHEXOL 350 MG/ML SOLN COMPARISON:  None Available. FINDINGS: Cardiovascular: The heart size is normal. No substantial pericardial effusion. No thoracic aortic aneurysm. No substantial atherosclerosis of the thoracic aorta. There is no filling defect within the opacified pulmonary arteries to suggest the presence of an acute pulmonary embolus.  Mediastinum/Nodes: No mediastinal lymphadenopathy. There is no hilar lymphadenopathy. The esophagus has normal imaging features. There is no axillary lymphadenopathy. Lungs/Pleura: No suspicious pulmonary nodule or mass. No focal airspace consolidation. No pleural effusion. Minimal dependent atelectasis noted in the lung bases. Upper Abdomen: 7.7 x 6.3 cm ill-defined low-density mass is identified in the dome of the liver. A second 12 mm low-density lesion is identified in the posterior right hepatic dome. 3.8 cm lesion inferior aspect of segment IV (271/5) may have some subtle irregular peripheral enhancement. There is also a 2.0 cm lesion in segment V visible on 268/5. Borderline lymphadenopathy is seen in the hepatoduodenal ligament. Musculoskeletal: No worrisome lytic or sclerotic osseous abnormality. Review of the MIP images confirms the above findings. IMPRESSION: 1. No CT evidence for acute pulmonary embolus. 2. 7.7 x 6.3 cm ill-defined low-density mass in the dome of the  liver with at least 3 additional lesions identified in both hepatic lobes. Imaging features raise concern for metastatic disease. Abdominal MRI with and without contrast recommended to further evaluate. 3. Borderline lymphadenopathy in the hepatoduodenal ligament. Attention on follow-up recommended. Electronically Signed   By: Kennith Center M.D.   On: 04/24/2023 16:41   DG Chest Port 1 View  Result Date: 04/24/2023 CLINICAL DATA:  Severe shortness of breath, heartburn. EXAM: PORTABLE CHEST 1 VIEW COMPARISON:  03/26/2018. FINDINGS: Trachea is midline. Heart size is accentuated by AP semi upright technique and low lung volumes. Mild bibasilar subsegmental atelectasis. No airspace consolidation or pleural fluid. IMPRESSION: Low lung volumes with mild bibasilar subsegmental atelectasis. Electronically Signed   By: Leanna Battles M.D.   On: 04/24/2023 15:32    Procedures Procedures    Medications Ordered in ED Medications  morphine  (PF) 4 MG/ML injection 4 mg (has no administration in time range)  sodium chloride 0.9 % bolus 1,000 mL (0 mLs Intravenous Stopped 04/24/23 1635)  ondansetron (ZOFRAN) injection 4 mg (4 mg Intravenous Given 04/24/23 1523)  morphine (PF) 4 MG/ML injection 4 mg (4 mg Intravenous Given 04/24/23 1523)  iohexol (OMNIPAQUE) 350 MG/ML injection 75 mL (75 mLs Intravenous Contrast Given 04/24/23 1628)    ED Course/ Medical Decision Making/ A&P                             Medical Decision Making Amount and/or Complexity of Data Reviewed Labs: ordered. Radiology: ordered.  Risk Prescription drug management.   This patient presents to the ED for concern of pain and sob, this involves an extensive number of treatment options, and is a complaint that carries with it a high risk of complications and morbidity.  The differential diagnosis includes PE, pna, gb, infection   Co morbidities that complicate the patient evaluation  DM   Additional history obtained:  Additional history obtained from epic chart review External records from outside source obtained and reviewed including wife   Lab Tests:  I Ordered, and personally interpreted labs.  The pertinent results include:  cbc bnl, bmp nl other than bs elevated at 198, lfts nl, ua + protein   Imaging Studies ordered:  I ordered imaging studies including cxr, ct chest, ruq [place], and ct abd/pelvis  I independently visualized and interpreted imaging which showed  CXR: Low lung volumes with mild bibasilar subsegmental atelectasis.  CT chest:  No CT evidence for acute pulmonary embolus.  2. 7.7 x 6.3 cm ill-defined low-density mass in the dome of the  liver with at least 3 additional lesions identified in both hepatic  lobes. Imaging features raise concern for metastatic disease.  Abdominal MRI with and without contrast recommended to further  evaluate.  3. Borderline lymphadenopathy in the hepatoduodenal ligament.  Attention on follow-up  recommended.  RUQ [place]: Large, circumscribed mass of the liver dome measuring 8.1 x 7.0 x  5.8 cm. Recommend further evaluation with liver protocol MRI or CT.    2.  No acute findings to explain pain.  CT abd/pelvis: Multiple liver lesions are again identified which again have  aggressive appearance but are poorly defined on this noncontrast  examination after previous contrast administration for CT of the  chest. Additional prominent nodes in the upper abdomen. Again the  changes are worrisome for malignancy.    Overall if additional radiologic evaluation is indicated, a repeat  study with dynamic contrast after clearance of the current  contrast  administration may be of some benefit to assess for a subtle lesion  in other organs as well as the better define these liver lesions.    Diffuse colonic stool without obstruction. Normal appendix. Colonic  diverticula.   I agree with the radiologist interpretation   Cardiac Monitoring:  The patient was maintained on a cardiac monitor.  I personally viewed and interpreted the cardiac monitored which showed an underlying rhythm of: nsr   Medicines ordered and prescription drug management:  I ordered medication including morpine/zofran  for sx  Reevaluation of the patient after these medicines showed that the patient improved I have reviewed the patients home medicines and have made adjustments as needed   Test Considered:  Ct/mri   Critical Interventions:  Pain control   Consultations Obtained:  I requested consultation with IR (Dr. Miles Costain),  and discussed lab and imaging findings as well as pertinent plan -he said pt needs a referral from pcp to get a bx with IR.     Problem List / ED Course:  RUQ pain:  unfortunately, this is probably due to a metastatic lesion.  Unfortunately, he has never had a colonoscopy, so this may be a primary colon.  However, this is unclear.  He did have a cologard 6-7 years ago.  MRI abd ordered,  but pt will need a bx.  He is aware that he needs to contact pcp so pcp can order this.  Pt d/c with lortab.  He is instructed to return if worse.   Reevaluation:  After the interventions noted above, I reevaluated the patient and found that they have :improved   Social Determinants of Health:  Lives at home   Dispostion:  After consideration of the diagnostic results and the patients response to treatment, I feel that the patent would benefit from discharge with outpatient f/u.          Final Clinical Impression(s) / ED Diagnoses Final diagnoses:  Liver mass  Right upper quadrant abdominal pain    Rx / DC Orders ED Discharge Orders          Ordered    MR ABDOMEN [facility] CONTRAST        04/24/23 1824    HYDROcodone-acetaminophen (NORCO/VICODIN) 5-325 MG tablet  Every 4 hours PRN        04/24/23 1825              Jacalyn Lefevre, MD 04/24/23 1919

## 2023-04-24 NOTE — Discharge Instructions (Addendum)
It is important you call your primary care doctor.  He can put in a referral to the interventional radiologist so they can get a biopsy of your liver.

## 2023-04-24 NOTE — ED Triage Notes (Signed)
Pt c/o severe SOB that started this am; pt states he had heart burn last night that has gotten progressively worse; pt states he bent over and when he moved back up he could not take a deep breath  Pt states the pain is on his right side and moves up to his shoulder

## 2023-04-25 MED FILL — Hydrocodone-Acetaminophen Tab 5-325 MG: ORAL | Qty: 6 | Status: AC

## 2023-04-26 ENCOUNTER — Encounter (HOSPITAL_COMMUNITY): Payer: Self-pay | Admitting: *Deleted

## 2023-04-30 ENCOUNTER — Other Ambulatory Visit (HOSPITAL_COMMUNITY): Payer: Self-pay | Admitting: Physician Assistant

## 2023-04-30 DIAGNOSIS — R109 Unspecified abdominal pain: Secondary | ICD-10-CM

## 2023-04-30 DIAGNOSIS — R160 Hepatomegaly, not elsewhere classified: Secondary | ICD-10-CM

## 2023-04-30 NOTE — Progress Notes (Signed)
I spoke with David [name] this am.  I confirmed that he does want to be seen at [facility] and not [facility].  He confirmed that his PCP Lenise Herald, PA-C at [facility] has ordered an IR liver biopsy at [place].  David Mccart understands we will schedule him for after his biopsy when results are available.  I told him myself or Mollie will reach out to him to schedule his consultation.  All questions were answered.  He verbalized understanding.

## 2023-05-02 ENCOUNTER — Inpatient Hospital Stay (HOSPITAL_BASED_OUTPATIENT_CLINIC_OR_DEPARTMENT_OTHER): Payer: PRIVATE HEALTH INSURANCE | Admitting: Hematology

## 2023-05-02 ENCOUNTER — Encounter: Payer: Self-pay | Admitting: Hematology

## 2023-05-02 ENCOUNTER — Other Ambulatory Visit (HOSPITAL_COMMUNITY): Payer: Self-pay | Admitting: Physician Assistant

## 2023-05-02 ENCOUNTER — Inpatient Hospital Stay: Payer: PRIVATE HEALTH INSURANCE | Attending: Hematology | Admitting: Hematology

## 2023-05-02 VITALS — BP 149/94 | HR 69 | Temp 98.1°F | Resp 18 | Ht 69.0 in | Wt 160.6 lb

## 2023-05-02 DIAGNOSIS — R160 Hepatomegaly, not elsewhere classified: Secondary | ICD-10-CM

## 2023-05-02 DIAGNOSIS — Z7984 Long term (current) use of oral hypoglycemic drugs: Secondary | ICD-10-CM | POA: Diagnosis not present

## 2023-05-02 DIAGNOSIS — Z801 Family history of malignant neoplasm of trachea, bronchus and lung: Secondary | ICD-10-CM | POA: Insufficient documentation

## 2023-05-02 DIAGNOSIS — R634 Abnormal weight loss: Secondary | ICD-10-CM | POA: Diagnosis not present

## 2023-05-02 DIAGNOSIS — C221 Intrahepatic bile duct carcinoma: Secondary | ICD-10-CM | POA: Insufficient documentation

## 2023-05-02 DIAGNOSIS — E119 Type 2 diabetes mellitus without complications: Secondary | ICD-10-CM | POA: Insufficient documentation

## 2023-05-02 LAB — CBC WITH DIFFERENTIAL (CANCER CENTER ONLY)
Abs Immature Granulocytes: 0.04 10*3/uL (ref 0.00–0.07)
Basophils Absolute: 0 10*3/uL (ref 0.0–0.1)
Basophils Relative: 1 %
Eosinophils Absolute: 0.1 10*3/uL (ref 0.0–0.5)
Eosinophils Relative: 1 %
HCT: 43.6 % (ref 39.0–52.0)
Hemoglobin: 14.9 g/dL (ref 13.0–17.0)
Immature Granulocytes: 1 %
Lymphocytes Relative: 23 %
Lymphs Abs: 1.5 10*3/uL (ref 0.7–4.0)
MCH: 30 pg (ref 26.0–34.0)
MCHC: 34.2 g/dL (ref 30.0–36.0)
MCV: 87.9 fL (ref 80.0–100.0)
Monocytes Absolute: 0.5 10*3/uL (ref 0.1–1.0)
Monocytes Relative: 8 %
Neutro Abs: 4.5 10*3/uL (ref 1.7–7.7)
Neutrophils Relative %: 66 %
Platelet Count: 371 10*3/uL (ref 150–400)
RBC: 4.96 MIL/uL (ref 4.22–5.81)
RDW: 11.9 % (ref 11.5–15.5)
WBC Count: 6.7 10*3/uL (ref 4.0–10.5)
nRBC: 0 % (ref 0.0–0.2)

## 2023-05-02 LAB — CMP (CANCER CENTER ONLY)
ALT: 20 U/L (ref 0–44)
AST: 17 U/L (ref 15–41)
Albumin: 4.8 g/dL (ref 3.5–5.0)
Alkaline Phosphatase: 111 U/L (ref 38–126)
Anion gap: 8 (ref 5–15)
BUN: 18 mg/dL (ref 6–20)
CO2: 29 mmol/L (ref 22–32)
Calcium: 10 mg/dL (ref 8.9–10.3)
Chloride: 102 mmol/L (ref 98–111)
Creatinine: 0.92 mg/dL (ref 0.61–1.24)
GFR, Estimated: >60 mL/min (ref >60)
Glucose, Bld: 121 mg/dL — ABNORMAL HIGH (ref 70–99)
Potassium: 4.5 mmol/L (ref 3.5–5.1)
Sodium: 139 mmol/L (ref 135–145)
Total Bilirubin: 0.6 mg/dL (ref 0.3–1.2)
Total Protein: 8.3 g/dL — ABNORMAL HIGH (ref 6.5–8.1)

## 2023-05-02 LAB — PROTIME-INR
INR: 0.9 (ref 0.8–1.2)
Prothrombin Time: 12.7 seconds (ref 11.4–15.2)

## 2023-05-02 NOTE — Progress Notes (Signed)
[facility]   Telephone:(336) [phone] Fax:(336) [phone]   Clinic New Consult Note   Patient Care Team: Samuella Bruin as PCP - General (Physician Assistant) Bensimhon, Carita Pian, MD (Internal Medicine)  Date of Service:  05/02/2023   CHIEF COMPLAINTS/PURPOSE OF CONSULTATION:  Liver Mass  REFERRING PHYSICIAN:  Mann,Benjamin L, PA-C  ASSESSMENT & PLAN:  David [name] is a 57 y.o. 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  He was evaluated in the emergency room on Apr 24, 2023.  CT chest, abdomen pelvis reviewed no evidence of PE, a large 7.7 x 6.3 cm mass in the dome of the liver, with at least 3 additional smaller lesions in both hepatic lobes.  No evidence of liver cirrhosis, no other primary tumor seen on CT scans. -I personally reviewed his CT scan images with patient and his family.  He has no history of heavy alcohol drinking, or hepatitis, this is unlikely HCC.  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 However he does not have anemia, or primary lesions seen in GI tract on the CT scan. -I recommend liver MRI for further evaluation, this is scheduled for later this week. -I recommend ultrasound guided liver biopsy by IR, I reviewed his case with Dr. Fredia Sorrow. -I will check tumor markers CEA, CA 19.9, AFP and PSA today, results are still pending.   PLAN:  -Reviewed CT scans w/ patient -Recommend liver Biopsy  -MR Abdomen schedule 5/19 -may need endoscopy, will decide after liver biopsy  -lab reviewed   HISTORY OF PRESENTING ILLNESS:   [name] 57 y.o. male with past medical history of diabetes, is a here because of recently discovered liver mass on CT. The patient was referred by  Mann,Benjamin L, PA-C. The patient presents to the clinic today accompanied by family.   He woke up last Thursday and had severe RUQ abdominal pain and had heart burn.Pt state when he stood he couldn't breathe and he able to take small breaths. Pt also had some shoulder pains.Pt decided to go to the ER.  CT scan was obtained in the ED, which showed a large mass in the dome of the liver with 3 small satellite lesions.  His pain has resolved spontaneously.  He denies any other GI symptoms, no change of bowel habits, his appetite and energy level has been normal.  He is diabetic, pt started losing weight last year of January pt has lost 20 lbs, his weight loss was related to medication or intentional. No weigh loss in past 6-9 months.    He has a PMHx of.... Diabetes mellitus Maternal Uncle-Lung Paternal Grandfather -lung cancer   Socially... -Married  -3 children - social drinker -    REVIEW OF SYSTEMS:    Constitutional: (-)Denies fevers, chills or abnormal night sweats Eyes:(-)  Denies blurriness of vision, double vision or watery eyes Ears, nose, mouth, throat, and face: Denies mucositis or sore throat Respiratory: (-)Denies cough, dyspnea or wheezes Cardiovascular:(-)  Denies palpitation, chest discomfort or lower extremity swelling Gastrointestinal:  Denies nausea, (+)heartburn or (-) change in bowel habits Skin: (-) Denies abnormal skin rashes Lymphatics: (-) Denies new lymphadenopathy or easy bruising Neurological:(-) Denies numbness, tingling or new weaknesses Behavioral/Psych: (-) Mood is stable, no new changes  All other systems were  reviewed with the patient and are negative.   MEDICAL HISTORY:  Past Medical History:  Diagnosis Date   DM (diabetes mellitus) (HCC)     SURGICAL HISTORY: Past Surgical History:  Procedure Laterality Date   FINGER AMPUTATION      SOCIAL HISTORY: Social History   Socioeconomic History   Marital status: Married    Spouse name: Not  on file   Number of children: 3   Years of education: Not on file   Highest education level: Not on file  Occupational History   Not on file  Tobacco Use   Smoking status: Never   Smokeless tobacco: Never  Vaping Use   Vaping Use: Never used  Substance and Sexual Activity   Alcohol use: Yes    Comment: occasionally   Drug use: Never   Sexual activity: Not on file  Other Topics Concern   Not on file  Social History Narrative   ** Merged History Encounter **       Social Determinants of Health   Financial Resource Strain: Not on file  Food Insecurity: Not on file  Transportation Needs: Not on file  Physical Activity: Not on file  Stress: Not on file  Social Connections: Not on file  Intimate Partner Violence: Not on file    FAMILY HISTORY: Family History  Problem Relation Age of Onset   Cancer Maternal Uncle        lung cancer   Cancer Maternal Grandfather        lung cancer    ALLERGIES:  has No Known Allergies.  MEDICATIONS:  Current Outpatient Medications  Medication Sig Dispense Refill   baclofen (LIORESAL) 10 MG tablet Take 10 mg by mouth 3 (three) times daily as needed.     Continuous Glucose Sensor (DEXCOM G7 SENSOR) MISC USE AS DIRECTED TO CHECK GLUCOSE - CHANGE SENSOR EVERY 10 DAYS (Patient not taking: Reported on 04/24/2023)     dapagliflozin propanediol (FARXIGA) 5 MG TABS tablet Take 5 mg by mouth daily. (Patient not taking: Reported on 04/24/2023)     HYDROcodone-acetaminophen (NORCO/VICODIN) 5-325 MG tablet Take 1 tablet by mouth every 4 (four) hours as needed. 10 tablet 0   HYDROcodone-acetaminophen (NORCO/VICODIN) 5-325 MG tablet Take 2 tablets by mouth every 4 (four) hours as needed. 6 tablet 0   ibuprofen (ADVIL) 800 MG tablet Take by mouth.     metFORMIN (GLUCOPHAGE-XR) 500 MG 24 hr tablet Take 500 mg by mouth every evening.     valACYclovir (VALTREX) 500 MG tablet as needed.     zolpidem (AMBIEN) 10 MG tablet Take by mouth.     No current  facility-administered medications for this visit.    PHYSICAL EXAMINATION: ECOG PERFORMANCE STATUS: 0 - Asymptomatic  Vitals:   05/02/23 1539  BP: (!) 149/94  Pulse: 69  Resp: 18  Temp: 98.1 F (36.7 C)  SpO2: 100%   Filed Weights   05/02/23 1539  Weight: 160 lb 9.6 oz (72.8 kg)     GENERAL:alert, no distress and comfortable SKIN: skin color normal, no rashes or significant lesions EYES: normal, Conjunctiva are pink and non-injected, sclera clear  NEURO: alert & oriented x 3 with fluent speech LUNGS: (-)clear to auscultation and percussion with normal breathing effort HEART: (-) regular rate & rhythm and no murmurs and no lower extremity edema ABDOMEN:(-)abdomen soft, (+) tender and (-)normal bowel sounds   LABORATORY DATA:  I have reviewed the data as listed    Latest Ref Rng & Units  05/02/2023    2:52 PM 04/24/2023    3:06 PM 03/26/2018    4:37 PM  CBC  WBC 4.0 - 10.5 K/uL 6.7  9.3  7.6   Hemoglobin 13.0 - 17.0 g/dL 64.4  03.4  74.2   Hematocrit 39.0 - 52.0 % 43.6  44.4  45.9   Platelets 150 - 400 K/uL 371  259  262        Latest Ref Rng & Units 05/02/2023    2:52 PM 04/24/2023    3:06 PM 08/04/2019   12:00 AM  CMP  Glucose 70 - 99 mg/dL 595  638    BUN 6 - 20 mg/dL 18  18    Creatinine 7.56 - 1.24 mg/dL 4.33  2.95    Sodium 188 - 145 mmol/L 139  137    Potassium 3.5 - 5.1 mmol/L 4.5  4.4    Chloride 98 - 111 mmol/L 102  101    CO2 22 - 32 mmol/L 29  27    Calcium 8.9 - 10.3 mg/dL 41.6  9.8    Total Protein 6.5 - 8.1 g/dL 8.3  8.2    Total Bilirubin 0.3 - 1.2 mg/dL 0.6  1.1    Alkaline Phos 38 - 126 U/L 111  104  80      AST 15 - 41 U/L 17  32  17      ALT 0 - 44 U/L 20  25  22          This result is from an external source.     RADIOGRAPHIC STUDIES: I have personally reviewed the radiological images as listed and agreed with the findings in the report. CT ABDOMEN PELVIS WO CONTRAST  Result Date: 04/24/2023 CLINICAL DATA:  Liver mass on ultrasound.  EXAM: CT ABDOMEN AND PELVIS WITHOUT CONTRAST TECHNIQUE: Multidetector CT imaging of the abdomen and pelvis was performed following the standard protocol without IV contrast. RADIATION DOSE REDUCTION: This exam was performed according to the departmental dose-optimization program which includes automated exposure control, adjustment of the mA and/or kV according to patient size and/or use of iterative reconstruction technique. COMPARISON:  Ultrasound and CT angiogram chest earlier 04/24/2023 FINDINGS: Lower chest: There is some linear opacity lung bases likely scar or atelectasis. No pleural effusion. Small right-sided pre cardiophrenic lymph nodes. Nonpathologic by size criteria but more numerous than usually seen. Hepatobiliary: There is contrast seen in the liver and parenchymal organs from the patient's prior contrast administration for the CTA of the chest. Once again there is a heterogeneous mass centered in the dome of the liver involving segments 8 and 4 in particular measuring up to 8.2 cm. This has poorly defined due to the contrast but has a aggressive features. There are additional lesions identified elsewhere in segment 4. Focus in segment 4B on series 2 image 24 measures 2.1 cm and more lateral on image 25 on the opposite side of the gallbladder fossa measuring 19 mm. Multifocal aggressive lesions. Please correlate for any history of known malignancy. Pancreas: Unremarkable. No pancreatic ductal dilatation or surrounding inflammatory changes. Spleen: Normal in size without focal abnormality. Adrenals/Urinary Tract: The adrenal glands are preserved. Parapelvic renal cysts. No collecting system dilatation. Evaluation for stones is limited as there is contrast throughout the renal collecting system, ureters and bladder. Preserved contours of the urinary bladder. Stomach/Bowel: Stomach is mildly distended with moderate luminal debris. On this noncontrast exam the small bowel has a normal course and caliber.  No dilatation. Large bowel  grossly has a normal course and caliber. There are diverticula along the sigmoid colon. Moderate colonic stool. Normal appendix in the right lower quadrant inferior to the cecum. Vascular/Lymphatic: Normal caliber aorta and IVC with mild atherosclerotic calcifications. There are a few prominent upper abdominal nodes identified in the portacaval space. Example on series 2, image 28 measures 3.1 by 0.9 cm. Small nodes in the porta hepatis. Reproductive: Prominent prostate. Other: No free air or free fluid. Proved few prominent mesenteric vessels identified. Musculoskeletal: Scattered degenerative changes of the spine and pelvis. Bridging osteophytes along the sacroiliac joints. IMPRESSION: Multiple liver lesions are again identified which again have aggressive appearance but are poorly defined on this noncontrast examination after previous contrast administration for CT of the chest. Additional prominent nodes in the upper abdomen. Again the changes are worrisome for malignancy. Overall if additional radiologic evaluation is indicated, a repeat study with dynamic contrast after clearance of the current contrast administration may be of some benefit to assess for a subtle lesion in other organs as well as the better define these liver lesions. Diffuse colonic stool without obstruction. Normal appendix. Colonic diverticula. Electronically Signed   By: Karen Kays M.D.   On: 04/24/2023 17:51   US Abdomen Limited RUQ (LIVER/GB)  Result Date: 04/24/2023 CLINICAL DATA:  Right upper quadrant abdominal pain for 1 week EXAM: ULTRASOUND ABDOMEN LIMITED RIGHT UPPER QUADRANT COMPARISON:  None Available. FINDINGS: Gallbladder: No gallstones or wall thickening visualized. No sonographic Murphy sign noted by sonographer. Common bile duct: Diameter: 0.4 cm Liver: Large, circumscribed mass of the liver dome measuring 8.1 x 7.0 x 5.8 cm. Within normal limits in parenchymal echogenicity. Portal vein is  patent on color Doppler imaging with normal direction of blood flow towards the liver. Other: None. IMPRESSION: 1. Large, circumscribed mass of the liver dome measuring 8.1 x 7.0 x 5.8 cm. Recommend further evaluation with liver protocol MRI or CT. 2.  No acute findings to explain pain. Electronically Signed   By: Jearld Lesch M.D.   On: 04/24/2023 17:13   CT Angio Chest PE W and/or Wo Contrast  Result Date: 04/24/2023 CLINICAL DATA:  Right-sided chest pain with shortness of breath and dyspnea. EXAM: CT ANGIOGRAPHY CHEST WITH CONTRAST TECHNIQUE: Multidetector CT imaging of the chest was performed using the standard protocol during bolus administration of intravenous contrast. Multiplanar CT image reconstructions and MIPs were obtained to evaluate the vascular anatomy. RADIATION DOSE REDUCTION: This exam was performed according to the departmental dose-optimization program which includes automated exposure control, adjustment of the mA and/or kV according to patient size and/or use of iterative reconstruction technique. CONTRAST:  75mL OMNIPAQUE IOHEXOL 350 MG/ML SOLN COMPARISON:  None Available. FINDINGS: Cardiovascular: The heart size is normal. No substantial pericardial effusion. No thoracic aortic aneurysm. No substantial atherosclerosis of the thoracic aorta. There is no filling defect within the opacified pulmonary arteries to suggest the presence of an acute pulmonary embolus. Mediastinum/Nodes: No mediastinal lymphadenopathy. There is no hilar lymphadenopathy. The esophagus has normal imaging features. There is no axillary lymphadenopathy. Lungs/Pleura: No suspicious pulmonary nodule or mass. No focal airspace consolidation. No pleural effusion. Minimal dependent atelectasis noted in the lung bases. Upper Abdomen: 7.7 x 6.3 cm ill-defined low-density mass is identified in the dome of the liver. A second 12 mm low-density lesion is identified in the posterior right hepatic dome. 3.8 cm lesion inferior  aspect of segment IV (271/5) may have some subtle irregular peripheral enhancement. There is also a 2.0 cm lesion in segment  V visible on 268/5. Borderline lymphadenopathy is seen in the hepatoduodenal ligament. Musculoskeletal: No worrisome lytic or sclerotic osseous abnormality. Review of the MIP images confirms the above findings. IMPRESSION: 1. No CT evidence for acute pulmonary embolus. 2. 7.7 x 6.3 cm ill-defined low-density mass in the dome of the liver with at least 3 additional lesions identified in both hepatic lobes. Imaging features raise concern for metastatic disease. Abdominal MRI with and without contrast recommended to further evaluate. 3. Borderline lymphadenopathy in the hepatoduodenal ligament. Attention on follow-up recommended. Electronically Signed   By: Kennith Center M.D.   On: 04/24/2023 16:41   DG Chest Port 1 View  Result Date: 04/24/2023 CLINICAL DATA:  Severe shortness of breath, heartburn. EXAM: PORTABLE CHEST 1 VIEW COMPARISON:  03/26/2018. FINDINGS: Trachea is midline. Heart size is accentuated by AP semi upright technique and low lung volumes. Mild bibasilar subsegmental atelectasis. No airspace consolidation or pleural fluid. IMPRESSION: Low lung volumes with mild bibasilar subsegmental atelectasis. Electronically Signed   By: Leanna Battles M.D.   On: 04/24/2023 15:32     No orders of the defined types were placed in this encounter.   All questions were answered. The patient knows to call the clinic with any problems, questions or concerns. The total time spent in the appointment was 60 minutes.     Malachy Mood, MD 05/02/2023   Carolin Coy am acting as scribe for Malachy Mood, MD.   I have reviewed the above documentation for accuracy and completeness, and I agree with the above.

## 2023-05-02 NOTE — Progress Notes (Signed)
I met with David [name], his wife, and son before  his consultation with Dr Mosetta Putt.  I explained my role as a nurse navigator and provided my contact information.  All questions were answered.  They verbalized understanding.

## 2023-05-02 NOTE — Progress Notes (Signed)
I spoke with Mr David [name] he is available for a consultation with Dr Mosetta Putt this afternoon with lab appt before.  He is aware of our location and appt times.  I asked that he arrive by 1445 for registration purposes.

## 2023-05-03 ENCOUNTER — Other Ambulatory Visit: Payer: Self-pay

## 2023-05-03 DIAGNOSIS — R160 Hepatomegaly, not elsewhere classified: Secondary | ICD-10-CM

## 2023-05-03 LAB — CEA (ACCESS): CEA (CHCC): 772.45 ng/mL — ABNORMAL HIGH (ref 0.00–5.00)

## 2023-05-03 NOTE — Progress Notes (Signed)
I faxed Dr Latanya Maudlin consultation note to patient's PCP Lenise Herald PA-C at [facility] .

## 2023-05-03 NOTE — Progress Notes (Signed)
David Lack, MD  Leodis Rains D PROCEDURE / BIOPSY REVIEW Date: 05/03/23  Requested Biopsy site: Liver Reason for request: Mass Imaging review: Best seen on CT  Decision: Approved Imaging modality to perform: Ultrasound Schedule with: Moderate Sedation Schedule for: Any VIR  Additional comments: @VIR : MRI is pending on Sunday, 5/19, but ok to schedule biopsy now to expedite procedure. @Schedulers . OK to schedule.  Please contact me with questions, concerns, or if issue pertaining to this request arise.  Reola Calkins, MD [facility]

## 2023-05-04 LAB — PROSTATE-SPECIFIC AG, SERUM (LABCORP): Prostate Specific Ag, Serum: 1.8 ng/mL (ref 0.0–4.0)

## 2023-05-04 LAB — CANCER ANTIGEN 19-9: CA 19-9: 495 U/mL — ABNORMAL HIGH (ref 0–35)

## 2023-05-04 LAB — AFP TUMOR MARKER: AFP, Serum, Tumor Marker: <1.8 ng/mL (ref 0.0–8.4)

## 2023-05-05 ENCOUNTER — Ambulatory Visit (HOSPITAL_COMMUNITY)
Admission: RE | Admit: 2023-05-05 | Discharge: 2023-05-05 | Disposition: A | Discharge status: Home / Self Care | Payer: PRIVATE HEALTH INSURANCE | Source: Ambulatory Visit | Attending: Physician Assistant | Admitting: Physician Assistant

## 2023-05-05 DIAGNOSIS — R160 Hepatomegaly, not elsewhere classified: Secondary | ICD-10-CM | POA: Insufficient documentation

## 2023-05-05 DIAGNOSIS — R109 Unspecified abdominal pain: Secondary | ICD-10-CM | POA: Diagnosis present

## 2023-05-05 MED ORDER — GADOBUTROL 1 MMOL/ML IV SOLN
7.0000 mL | Freq: Once | INTRAVENOUS | Status: AC | PRN
  Administered 2023-05-05: 7 mL via INTRAVENOUS

## 2023-05-06 NOTE — Progress Notes (Signed)
I spoke with David [name] and his wife.  I reviewed the Ca 19-9 and CEA results.  I also reviewed the results of the MRI.  We discussed the findings in the tail of the pancreas.  He asked if the cancer is from the pancreas is this treatable.  I did tell him that chemotherapy is an option for treatment. I also let him know if this pancreatic cancer we would order genetic testing.  All questions were answered.  He verbalized understanding.

## 2023-05-08 ENCOUNTER — Telehealth: Payer: Self-pay | Admitting: Hematology

## 2023-05-08 NOTE — Telephone Encounter (Signed)
Contacted patient to scheduled appointments. Left message with appointment details and a call back number if patient had any questions or could not accommodate the time we provided.   

## 2023-05-09 ENCOUNTER — Other Ambulatory Visit: Payer: Self-pay | Admitting: Radiology

## 2023-05-09 DIAGNOSIS — R160 Hepatomegaly, not elsewhere classified: Secondary | ICD-10-CM

## 2023-05-09 NOTE — H&P (Signed)
Chief Complaint: Patient was seen in consultation today for liver lesion biopsy.  Referring Physician(s): Feng,Yan  Supervising Physician: Irish Lack  Patient Status: [facility] - Out-pt  History of Present Illness: David [name] is a 57 y.o. male with a medical history significant for diabetes. He presented to the ED 04/24/23 with complaints of severe RUQ pain, heart burn and difficulty breathing. Imaging obtained showed a large mass in the dome of the liver with satellite lesions. IR was requested to perform biopsy but IR recommended further outpatient follow up with plans for outpatient biopsy. He was otherwise clinically stable and after his pain was successfully managed he was discharged home with outpatient follow up with Oncology. He met with Dr. Mosetta Putt 05/02/23 and additional imaging was ordered.    MR Abdomen 05/05/23 IMPRESSION: 1. Multiple rim hypoenhancing liver lesions, largest again in the superior liver dome measuring 8.0 x 7.3 cm with multiple small satellite lesions, additional lesions in hepatic segment IVB and V. Findings are most consistent with hepatic metastatic disease. 2. Appearance of the tip of the pancreatic tail is suspicious for a small mass, concerning for pancreatic adenocarcinoma. No other findings to suggest primary lesion in the abdomen.  Interventional Radiology has been asked to evaluate this patient for an image-guided liver lesion biopsy. Imaging reviewed and procedure approved by Dr. Fredia Sorrow.   Past Medical History:  Diagnosis Date   DM (diabetes mellitus) (HCC)     Past Surgical History:  Procedure Laterality Date   FINGER AMPUTATION      Allergies: Patient has no known allergies.  Medications: Prior to Admission medications   Medication Sig Start Date End Date Taking? Authorizing Provider  baclofen (LIORESAL) 10 MG tablet Take 10 mg by mouth 3 (three) times daily as needed.    [provider]  Continuous Glucose Sensor (DEXCOM  G7 SENSOR) MISC USE AS DIRECTED TO CHECK GLUCOSE - CHANGE SENSOR EVERY 10 DAYS Patient not taking: Reported on 04/24/2023    [provider]  dapagliflozin propanediol (FARXIGA) 5 MG TABS tablet Take 5 mg by mouth daily. Patient not taking: Reported on 04/24/2023 02/18/23   [provider]  HYDROcodone-acetaminophen (NORCO/VICODIN) 5-325 MG tablet Take 1 tablet by mouth every 4 (four) hours as needed. 04/24/23   Jacalyn Lefevre, MD  HYDROcodone-acetaminophen (NORCO/VICODIN) 5-325 MG tablet Take 2 tablets by mouth every 4 (four) hours as needed. 04/24/23   Jacalyn Lefevre, MD  ibuprofen (ADVIL) 800 MG tablet Take by mouth.    [provider]  metFORMIN (GLUCOPHAGE-XR) 500 MG 24 hr tablet Take 500 mg by mouth every evening. 02/18/23   [provider]  valACYclovir (VALTREX) 500 MG tablet as needed.    [provider]  zolpidem (AMBIEN) 10 MG tablet Take by mouth. 02/18/23   [provider]     Family History  Problem Relation Age of Onset   Cancer Maternal Uncle        lung cancer   Cancer Maternal Grandfather        lung cancer    Social History   Socioeconomic History   Marital status: Married    Spouse name: Not on file   Number of children: 3   Years of education: Not on file   Highest education level: Not on file  Occupational History   Not on file  Tobacco Use   Smoking status: Never   Smokeless tobacco: Never  Vaping Use   Vaping Use: Never used  Substance and Sexual Activity  Alcohol use: Yes    Comment: occasionally   Drug use: Never   Sexual activity: Not on file  Other Topics Concern   Not on file  Social History Narrative   ** Merged History Encounter **       Social Determinants of Health   Financial Resource Strain: Not on file  Food Insecurity: Not on file  Transportation Needs: Not on file  Physical Activity: Not on file  Stress: Not on file  Social Connections: Not on file    Review of Systems: A 12 point  ROS discussed and pertinent positives are indicated in the HPI above.  All other systems are negative.  Review of Systems  Constitutional:  Negative for appetite change and fatigue.  Respiratory:  Negative for cough and shortness of breath.   Cardiovascular:  Negative for chest pain and leg swelling.  Gastrointestinal:  Negative for abdominal pain, diarrhea, nausea and vomiting.  Genitourinary:  Negative for flank pain.  Musculoskeletal:  Negative for back pain.  Neurological:  Negative for dizziness and headaches.    Vital Signs: BP 132/89   Pulse 71   Temp 98.8 F (37.1 C) (Temporal)   Resp 18   Ht 5\' 8"  (1.727 m)   Wt 160 lb (72.6 kg)   SpO2 97%   BMI 24.33 kg/m   Physical Exam Constitutional:      General: He is not in acute distress.    Appearance: He is not ill-appearing.  HENT:     Mouth/Throat:     Mouth: Mucous membranes are moist.     Pharynx: Oropharynx is clear.  Cardiovascular:     Rate and Rhythm: Normal rate and regular rhythm.     Pulses: Normal pulses.     Heart sounds: Normal heart sounds.  Pulmonary:     Effort: Pulmonary effort is normal.     Breath sounds: Normal breath sounds.  Abdominal:     General: Bowel sounds are normal.     Palpations: Abdomen is soft.     Tenderness: There is no abdominal tenderness.  Musculoskeletal:     Right lower leg: No edema.     Left lower leg: No edema.  Skin:    General: Skin is warm and dry.  Neurological:     Mental Status: He is alert and oriented to person, place, and time.  Psychiatric:        Mood and Affect: Mood normal.        Behavior: Behavior normal.        Thought Content: Thought content normal.        Judgment: Judgment normal.     Imaging: MR ABDOMEN WWO CONTRAST  Result Date: 05/05/2023 CLINICAL DATA:  Characterize lesions identified by prior CT and ultrasound EXAM: MRI ABDOMEN WITHOUT AND WITH CONTRAST TECHNIQUE: Multiplanar multisequence MR imaging of the abdomen was performed both  before and after the administration of intravenous contrast. CONTRAST:  7mL GADAVIST GADOBUTROL 1 MMOL/ML IV SOLN COMPARISON:  CT abdomen pelvis, 04/24/2023 FINDINGS: Lower chest: No acute abnormality. Hepatobiliary: Multiple rim hypoenhancing liver lesions, largest again in the superior liver dome measuring 8.0 x 7.3 cm with multiple small satellite lesions (series 14, image 11), additional lesion in hepatic segment IV B measuring 3.3 x 2.8 cm (series 14, image 30), and in hepatic segment V adjacent to the gallbladder fossa measuring 0.8 x 1.7 cm (series 14, image 35). No gallstones, gallbladder wall thickening, or biliary dilatation. Pancreas: Appearance of the tip of the pancreatic  tail is suspicious for a small mass measuring no greater than 2.3 x 1.7 cm which is subtly T2 hypointense and isoenhancing topancreatic parenchyma (series 4, image 19). No pancreatic ductal dilatation or surrounding inflammatory changes. Spleen: Normal in size without significant abnormality. Adrenals/Urinary Tract: Adrenal glands are unremarkable. Kidneys are normal, without renal calculi, solid lesion, or hydronephrosis. Stomach/Bowel: Stomach is within normal limits. No evidence of bowel wall thickening, distention, or inflammatory changes. Vascular/Lymphatic: No significant vascular findings are present. No enlarged abdominal lymph nodes. Other: No abdominal wall hernia or abnormality. No ascites. Musculoskeletal: No acute or significant osseous findings. IMPRESSION: 1. Multiple rim hypoenhancing liver lesions, largest again in the superior liver dome measuring 8.0 x 7.3 cm with multiple small satellite lesions, additional lesions in hepatic segment IVB and V. Findings are most consistent with hepatic metastatic disease. 2. Appearance of the tip of the pancreatic tail is suspicious for a small mass, concerning for pancreatic adenocarcinoma. No other findings to suggest primary lesion in the abdomen. These results will be called to  the ordering clinician or representative by the Radiologist Assistant, and communication documented in the PACS or [employer]. Electronically Signed   By: Jearld Lesch M.D.   On: 05/05/2023 16:02   CT ABDOMEN PELVIS WO CONTRAST  Result Date: 04/24/2023 CLINICAL DATA:  Liver mass on ultrasound. EXAM: CT ABDOMEN AND PELVIS WITHOUT CONTRAST TECHNIQUE: Multidetector CT imaging of the abdomen and pelvis was performed following the standard protocol without IV contrast. RADIATION DOSE REDUCTION: This exam was performed according to the departmental dose-optimization program which includes automated exposure control, adjustment of the mA and/or kV according to patient size and/or use of iterative reconstruction technique. COMPARISON:  Ultrasound and CT angiogram chest earlier 04/24/2023 FINDINGS: Lower chest: There is some linear opacity lung bases likely scar or atelectasis. No pleural effusion. Small right-sided pre cardiophrenic lymph nodes. Nonpathologic by size criteria but more numerous than usually seen. Hepatobiliary: There is contrast seen in the liver and parenchymal organs from the patient's prior contrast administration for the CTA of the chest. Once again there is a heterogeneous mass centered in the dome of the liver involving segments 8 and 4 in particular measuring up to 8.2 cm. This has poorly defined due to the contrast but has a aggressive features. There are additional lesions identified elsewhere in segment 4. Focus in segment 4B on series 2 image 24 measures 2.1 cm and more lateral on image 25 on the opposite side of the gallbladder fossa measuring 19 mm. Multifocal aggressive lesions. Please correlate for any history of known malignancy. Pancreas: Unremarkable. No pancreatic ductal dilatation or surrounding inflammatory changes. Spleen: Normal in size without focal abnormality. Adrenals/Urinary Tract: The adrenal glands are preserved. Parapelvic renal cysts. No collecting system dilatation.  Evaluation for stones is limited as there is contrast throughout the renal collecting system, ureters and bladder. Preserved contours of the urinary bladder. Stomach/Bowel: Stomach is mildly distended with moderate luminal debris. On this noncontrast exam the small bowel has a normal course and caliber. No dilatation. Large bowel grossly has a normal course and caliber. There are diverticula along the sigmoid colon. Moderate colonic stool. Normal appendix in the right lower quadrant inferior to the cecum. Vascular/Lymphatic: Normal caliber aorta and IVC with mild atherosclerotic calcifications. There are a few prominent upper abdominal nodes identified in the portacaval space. Example on series 2, image 28 measures 3.1 by 0.9 cm. Small nodes in the porta hepatis. Reproductive: Prominent prostate. Other: No free air or free fluid.  Proved few prominent mesenteric vessels identified. Musculoskeletal: Scattered degenerative changes of the spine and pelvis. Bridging osteophytes along the sacroiliac joints. IMPRESSION: Multiple liver lesions are again identified which again have aggressive appearance but are poorly defined on this noncontrast examination after previous contrast administration for CT of the chest. Additional prominent nodes in the upper abdomen. Again the changes are worrisome for malignancy. Overall if additional radiologic evaluation is indicated, a repeat study with dynamic contrast after clearance of the current contrast administration may be of some benefit to assess for a subtle lesion in other organs as well as the better define these liver lesions. Diffuse colonic stool without obstruction. Normal appendix. Colonic diverticula. Electronically Signed   By: Karen Kays M.D.   On: 04/24/2023 17:51   US Abdomen Limited RUQ (LIVER/GB)  Result Date: 04/24/2023 CLINICAL DATA:  Right upper quadrant abdominal pain for 1 week EXAM: ULTRASOUND ABDOMEN LIMITED RIGHT UPPER QUADRANT COMPARISON:  None  Available. FINDINGS: Gallbladder: No gallstones or wall thickening visualized. No sonographic Murphy sign noted by sonographer. Common bile duct: Diameter: 0.4 cm Liver: Large, circumscribed mass of the liver dome measuring 8.1 x 7.0 x 5.8 cm. Within normal limits in parenchymal echogenicity. Portal vein is patent on color Doppler imaging with normal direction of blood flow towards the liver. Other: None. IMPRESSION: 1. Large, circumscribed mass of the liver dome measuring 8.1 x 7.0 x 5.8 cm. Recommend further evaluation with liver protocol MRI or CT. 2.  No acute findings to explain pain. Electronically Signed   By: Jearld Lesch M.D.   On: 04/24/2023 17:13   CT Angio Chest PE W and/or Wo Contrast  Result Date: 04/24/2023 CLINICAL DATA:  Right-sided chest pain with shortness of breath and dyspnea. EXAM: CT ANGIOGRAPHY CHEST WITH CONTRAST TECHNIQUE: Multidetector CT imaging of the chest was performed using the standard protocol during bolus administration of intravenous contrast. Multiplanar CT image reconstructions and MIPs were obtained to evaluate the vascular anatomy. RADIATION DOSE REDUCTION: This exam was performed according to the departmental dose-optimization program which includes automated exposure control, adjustment of the mA and/or kV according to patient size and/or use of iterative reconstruction technique. CONTRAST:  75mL OMNIPAQUE IOHEXOL 350 MG/ML SOLN COMPARISON:  None Available. FINDINGS: Cardiovascular: The heart size is normal. No substantial pericardial effusion. No thoracic aortic aneurysm. No substantial atherosclerosis of the thoracic aorta. There is no filling defect within the opacified pulmonary arteries to suggest the presence of an acute pulmonary embolus. Mediastinum/Nodes: No mediastinal lymphadenopathy. There is no hilar lymphadenopathy. The esophagus has normal imaging features. There is no axillary lymphadenopathy. Lungs/Pleura: No suspicious pulmonary nodule or mass. No focal  airspace consolidation. No pleural effusion. Minimal dependent atelectasis noted in the lung bases. Upper Abdomen: 7.7 x 6.3 cm ill-defined low-density mass is identified in the dome of the liver. A second 12 mm low-density lesion is identified in the posterior right hepatic dome. 3.8 cm lesion inferior aspect of segment IV (271/5) may have some subtle irregular peripheral enhancement. There is also a 2.0 cm lesion in segment V visible on 268/5. Borderline lymphadenopathy is seen in the hepatoduodenal ligament. Musculoskeletal: No worrisome lytic or sclerotic osseous abnormality. Review of the MIP images confirms the above findings. IMPRESSION: 1. No CT evidence for acute pulmonary embolus. 2. 7.7 x 6.3 cm ill-defined low-density mass in the dome of the liver with at least 3 additional lesions identified in both hepatic lobes. Imaging features raise concern for metastatic disease. Abdominal MRI with and without contrast recommended to  further evaluate. 3. Borderline lymphadenopathy in the hepatoduodenal ligament. Attention on follow-up recommended. Electronically Signed   By: Kennith Center M.D.   On: 04/24/2023 16:41   DG Chest Port 1 View  Result Date: 04/24/2023 CLINICAL DATA:  Severe shortness of breath, heartburn. EXAM: PORTABLE CHEST 1 VIEW COMPARISON:  03/26/2018. FINDINGS: Trachea is midline. Heart size is accentuated by AP semi upright technique and low lung volumes. Mild bibasilar subsegmental atelectasis. No airspace consolidation or pleural fluid. IMPRESSION: Low lung volumes with mild bibasilar subsegmental atelectasis. Electronically Signed   By: Leanna Battles M.D.   On: 04/24/2023 15:32    Labs:  CBC: Recent Labs    04/24/23 1506 05/02/23 1452 05/10/23 0815  WBC 9.3 6.7 6.9  HGB 15.0 14.9 15.2  HCT 44.4 43.6 45.2  PLT 259 371 362    COAGS: Recent Labs    05/02/23 1452 05/10/23 0815  INR 0.9 0.9    BMP: Recent Labs    04/24/23 1506 05/02/23 1452  NA 137 139  K 4.4 4.5   CL 101 102  CO2 27 29  GLUCOSE 198* 121*  BUN 18 18  CALCIUM 9.8 10.0  CREATININE 1.05 0.92  GFRNONAA >60 >60    LIVER FUNCTION TESTS: Recent Labs    04/24/23 1506 05/02/23 1452  BILITOT 1.1 0.6  AST 32 17  ALT 25 20  ALKPHOS 104 111  PROT 8.2* 8.3*  ALBUMIN 4.6 4.8    TUMOR MARKERS: Recent Labs    05/02/23 1452  CEA 772.45*    Assessment and Plan:  Multiple liver masses; pancreatic mass: David [name], 57 year old male, presents today to the [facility] Interventional Radiology department for an image-guided liver lesion biopsy.  Risks and benefits of this procedure were discussed with the patient and/or patient's family including, but not limited to bleeding, infection, damage to adjacent structures or low yield requiring additional tests.  All of the questions were answered and there is agreement to proceed. He has been NPO. He does not take any blood-thinning medications. He is a full code.   Consent signed and in chart.  Thank you for this interesting consult.  I greatly enjoyed meeting David [name] and look forward to participating in their care.  A copy of this report was sent to the requesting provider on this date.  Electronically Signed: Alwyn Ren, AGACNP-BC [phone] 05/10/2023, 9:19 AM   I spent a total of  30 Minutes   in face to face in clinical consultation, greater than 50% of which was counseling/coordinating care for liver lesion biopsy

## 2023-05-10 ENCOUNTER — Ambulatory Visit (HOSPITAL_COMMUNITY)
Admission: RE | Admit: 2023-05-10 | Discharge: 2023-05-10 | Disposition: A | Discharge status: Home / Self Care | Payer: PRIVATE HEALTH INSURANCE | Source: Ambulatory Visit | Attending: Hematology | Admitting: Hematology

## 2023-05-10 ENCOUNTER — Other Ambulatory Visit: Payer: Self-pay

## 2023-05-10 ENCOUNTER — Ambulatory Visit (HOSPITAL_COMMUNITY): Payer: BC Managed Care – PPO

## 2023-05-10 DIAGNOSIS — E119 Type 2 diabetes mellitus without complications: Secondary | ICD-10-CM | POA: Insufficient documentation

## 2023-05-10 DIAGNOSIS — K8689 Other specified diseases of pancreas: Secondary | ICD-10-CM | POA: Diagnosis not present

## 2023-05-10 DIAGNOSIS — C787 Secondary malignant neoplasm of liver and intrahepatic bile duct: Secondary | ICD-10-CM | POA: Insufficient documentation

## 2023-05-10 DIAGNOSIS — R160 Hepatomegaly, not elsewhere classified: Secondary | ICD-10-CM | POA: Insufficient documentation

## 2023-05-10 HISTORY — PX: LIVER BIOPSY: SHX301

## 2023-05-10 LAB — GLUCOSE, CAPILLARY
Glucose-Capillary: 145 mg/dL — ABNORMAL HIGH (ref 70–99)
Glucose-Capillary: 162 mg/dL — ABNORMAL HIGH (ref 70–99)

## 2023-05-10 LAB — CBC
HCT: 45.2 % (ref 39.0–52.0)
Hemoglobin: 15.2 g/dL (ref 13.0–17.0)
MCH: 30.1 pg (ref 26.0–34.0)
MCHC: 33.6 g/dL (ref 30.0–36.0)
MCV: 89.5 fL (ref 80.0–100.0)
Platelets: 362 10*3/uL (ref 150–400)
RBC: 5.05 MIL/uL (ref 4.22–5.81)
RDW: 12.2 % (ref 11.5–15.5)
WBC: 6.9 10*3/uL (ref 4.0–10.5)
nRBC: 0 % (ref 0.0–0.2)

## 2023-05-10 LAB — PROTIME-INR
INR: 0.9 (ref 0.8–1.2)
Prothrombin Time: 12.7 seconds (ref 11.4–15.2)

## 2023-05-10 MED ORDER — SODIUM CHLORIDE 0.9 % IV SOLN: INTRAVENOUS | Status: DC

## 2023-05-10 MED ORDER — FENTANYL CITRATE (PF) 100 MCG/2ML IJ SOLN
INTRAMUSCULAR | Status: AC
  Filled 2023-05-10: qty 2

## 2023-05-10 MED ORDER — FENTANYL CITRATE (PF) 100 MCG/2ML IJ SOLN
INTRAMUSCULAR | Status: AC | PRN
  Administered 2023-05-10 (×2): 50 ug via INTRAVENOUS

## 2023-05-10 MED ORDER — SODIUM CHLORIDE 0.9 % IV SOLN
INTRAVENOUS | Status: AC | PRN
  Administered 2023-05-10: 10 mL/h via INTRAVENOUS

## 2023-05-10 MED ORDER — MIDAZOLAM HCL 2 MG/2ML IJ SOLN
INTRAMUSCULAR | Status: AC | PRN
  Administered 2023-05-10 (×2): 1 mg via INTRAVENOUS

## 2023-05-10 MED ORDER — LIDOCAINE HCL (PF) 1 % IJ SOLN
5.0000 mL | Freq: Once | INTRAMUSCULAR | Status: AC
  Administered 2023-05-10: 5 mL via INTRADERMAL

## 2023-05-10 MED ORDER — MIDAZOLAM HCL 2 MG/2ML IJ SOLN
INTRAMUSCULAR | Status: AC
  Filled 2023-05-10: qty 2

## 2023-05-10 NOTE — Procedures (Signed)
Interventional Radiology Procedure Note  Procedure: US guided liver lesion biopsy  Complications: None  Estimated Blood Loss: None  Recommendations: - Bedrest x 2 hrs - DC home  Signed,  Analyce Tavares K. Adalina Dopson, MD    

## 2023-05-16 ENCOUNTER — Other Ambulatory Visit: Payer: Self-pay | Admitting: Genetic Counselor

## 2023-05-16 ENCOUNTER — Other Ambulatory Visit: Payer: Self-pay

## 2023-05-16 DIAGNOSIS — Z1379 Encounter for other screening for genetic and chromosomal anomalies: Secondary | ICD-10-CM

## 2023-05-16 DIAGNOSIS — C259 Malignant neoplasm of pancreas, unspecified: Secondary | ICD-10-CM

## 2023-05-16 DIAGNOSIS — C787 Secondary malignant neoplasm of liver and intrahepatic bile duct: Secondary | ICD-10-CM | POA: Insufficient documentation

## 2023-05-16 NOTE — Assessment & Plan Note (Signed)
-  stage IV --Patient presented with sudden onset right upper quadrant pain, and some right shoulder referring pain.  He was evaluated in the emergency room on Apr 24, 2023.  CT chest, abdomen pelvis reviewed no evidence of PE, a large 7.7 x 6.3 cm mass in the dome of the liver, with at least 3 additional smaller lesions in both hepatic lobes.  No evidence of liver cirrhosis, no other primary tumor seen on CT scans.  -abdominal MRI with and without contrast from May 05, 2023 showed multiple liver lesions, which largest 8 cm in the dome and a 2.3 cm mass in the tail of pancreas. -I discussed his liver biopsy pathology findings, which showed adenocarcinoma with lymphovascular invasion, CK7 strongly positive, patchy strong positive CDX2.  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 -I discussed the aggressive and incurable nature of metastatic pancreatic cancer, and overall poor prognosis. -Will recommend MMR and [employer] next generation sequencing, to see if he is a candidate for immunotherapy or targeted therapy -I discussed systemic treatment options.  I recommend first-line chemotherapy FOLFIRINOX, and other options to include chemotherapy and Abraxane, or single agent gemcitabine.  Given his young age and good performance status, he is a candidate for FOLFIRINOX. --Chemotherapy consent: Side effects including but does not not limited to, fatigue, nausea, vomiting, diarrhea, hair loss, neuropathy, fluid retention, renal and kidney dysfunction, neutropenic fever, needed for blood transfusion, bleeding, were discussed with patient in great detail. He agrees to proceed. -The goal of chemotherapy is palliative, to prolong his life and improve cancer-related symptoms. -Plan to start treatment next week after port placement.

## 2023-05-16 NOTE — Progress Notes (Signed)
[facility]   [phone] Fax:(336) [phone]   Clinic Follow up Note   Patient Care Team: Samuella Bruin as PCP - General (Physician Assistant) Bensimhon, Carita Pian, MD (Internal Medicine) Malachy Mood, MD as Consulting Physician (Oncology)  Date of Service:  05/17/2023  CHIEF COMPLAINT: f/u of  Metastatic Pancreatic Cancer    CURRENT THERAPY:  FOLFIRINOX q14d x4 cycles  ASSESSMENT:  David [name] is a 57 y.o. male with   Pancreatic cancer metastasized to liver [facility]) -stage IV --Patient presented with sudden onset right upper quadrant pain, and some right shoulder referring pain.  He was evaluated in the emergency room on Apr 24, 2023.  CT chest, abdomen pelvis reviewed no evidence of PE, a large 7.7 x 6.3 cm mass in the dome of the liver, with at least 3 additional smaller lesions in both hepatic lobes.  No evidence of liver cirrhosis, no other primary tumor seen on CT scans.  -abdominal MRI with and without contrast from May 05, 2023 showed multiple liver lesions, which largest 8 cm in the dome and a 2.3 cm mass in the tail of pancreas. -I discussed his liver biopsy pathology findings, which showed adenocarcinoma with lymphovascular invasion, CK7 strongly positive, patchy strong positive CDX2.  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 -Patient and his family still has some questions about the primary tumor location, will get a PET scan to further evaluation.  If the lesion in the pancreas light up on the PET scan, I think he needs pancreatic lesion biopsy. -I discussed the aggressive and incurable nature of metastatic pancreatic cancer, and overall poor prognosis. -Will recommend MMR and [employer] next generation sequencing, to see if he is a candidate for immunotherapy or targeted therapy -I discussed systemic treatment options.  I recommend first-line chemotherapy FOLFIRINOX, and other options to include  chemotherapy and Abraxane, or single agent gemcitabine.  Given his young age and good performance status, he is a candidate for FOLFIRINOX. --Chemotherapy consent: Side effects including but does not not limited to, fatigue, nausea, vomiting, diarrhea, hair loss, neuropathy, fluid retention, renal and kidney dysfunction, neutropenic fever, needed for blood transfusion, bleeding, were discussed with patient in great detail. He agrees to proceed. -The goal of chemotherapy is palliative, to prolong his life and improve cancer-related symptoms. -I discussed port placement -Patient has self referred to [facility] for second opinion, he is scheduled to see Dr. Berton Mount on June 03, 2023.  I will reach out to Dr. Berton Mount to see if he is able to see him sooner.     PLAN: -Lab schedule @9 :00 Genetics and [employer] -I recommend Miralax or seneca for constipation. -previous lab reviewed  -Reviewed Biopsy, and Liver MRI finding, which is consistent with metastatic pancreatic cancer -will present his case in tumor board conference next week - I order PET scan in 1 weeks -recommend port placement - I recommend first-line chemotherapy with FOLFIRINOX - I will reach out to [facility] Dr. Berton Mount to see if he is able to see him sooner, patient wants to start chemo treatment after his visit at [facility].    SUMMARY OF ONCOLOGIC HISTORY: Oncology History  Pancreatic cancer metastasized to liver (HCC)  05/05/2023 Imaging   MR ABDOMEN W/WO CONTRAST   IMPRESSION: 1. Multiple rim hypoenhancing liver lesions, largest again in the superior liver dome measuring 8.0 x 7.3 cm with multiple small satellite lesions, additional lesions in hepatic segment IVB and V. Findings are most consistent  with hepatic metastatic disease.   2. Appearance of the tip of the pancreatic tail is suspicious for a small mass, concerning for pancreatic adenocarcinoma. No other findings to suggest primary lesion in the abdomen.     05/10/2023 Pathology  Results    FINAL MICROSCOPIC DIAGNOSIS:   A. LIVER, NEEDLE CORE BIOPSY:       Adenocarcinoma.    05/16/2023 Initial Diagnosis   Pancreatic cancer metastasized to liver (HCC)   05/24/2023 -  Chemotherapy   Patient is on Treatment Plan : PANCREAS Modified FOLFIRINOX q14d x 4 cycles        INTERVAL HISTORY:  David [name] is here for a follow up of Liver Mass. He was last seen by me on 05/02/2023. He presents to the clinic accompanied by family. Pt state that he biopsy went well. Pt state that he has some shoulder pain   for months ,and is taking Ibuprofen , and he has not had a BM in three days. Pt states that he had some nights sweats and had to change his t-shirt.     All other systems were reviewed with the patient and are negative.  MEDICAL HISTORY:  Past Medical History:  Diagnosis Date   DM (diabetes mellitus) (HCC)     SURGICAL HISTORY: Past Surgical History:  Procedure Laterality Date   FINGER AMPUTATION      I have reviewed the social history and family history with the patient and they are unchanged from previous note.  ALLERGIES:  has No Known Allergies.  MEDICATIONS:  Current Outpatient Medications  Medication Sig Dispense Refill   baclofen (LIORESAL) 10 MG tablet Take 10 mg by mouth 3 (three) times daily as needed.     Continuous Glucose Sensor (DEXCOM G7 SENSOR) MISC USE AS DIRECTED TO CHECK GLUCOSE - CHANGE SENSOR EVERY 10 DAYS (Patient not taking: Reported on 04/24/2023)     dapagliflozin propanediol (FARXIGA) 5 MG TABS tablet Take 5 mg by mouth daily.     HYDROcodone-acetaminophen (NORCO/VICODIN) 5-325 MG tablet Take 1 tablet by mouth every 4 (four) hours as needed. 10 tablet 0   HYDROcodone-acetaminophen (NORCO/VICODIN) 5-325 MG tablet Take 2 tablets by mouth every 4 (four) hours as needed. 6 tablet 0   ibuprofen (ADVIL) 800 MG tablet Take by mouth.     lidocaine-prilocaine (EMLA) cream Apply to affected area once 30 g 3   metFORMIN (GLUCOPHAGE-XR) 500 MG  24 hr tablet Take 500 mg by mouth every evening.     ondansetron (ZOFRAN) 8 MG tablet Take 1 tablet (8 mg total) by mouth every 8 (eight) hours as needed for nausea or vomiting. Start on the third day after cisplatin 30 tablet 1   prochlorperazine (COMPAZINE) 10 MG tablet Take 1 tablet (10 mg total) by mouth every 6 (six) hours as needed for nausea or vomiting (Nausea or vomiting). 30 tablet 1   valACYclovir (VALTREX) 500 MG tablet as needed.     zolpidem (AMBIEN) 10 MG tablet Take by mouth.     No current facility-administered medications for this visit.    PHYSICAL EXAMINATION: ECOG PERFORMANCE STATUS: 1 - Symptomatic but completely ambulatory  Vitals:   05/17/23 0830  BP: 135/85  Pulse: 84  Resp: 18  Temp: 97.9 F (36.6 C)  SpO2: 100%   Wt Readings from Last 3 Encounters:  05/17/23 162 lb 6.4 oz (73.7 kg)  05/10/23 160 lb (72.6 kg)  05/02/23 160 lb 9.6 oz (72.8 kg)     GENERAL:alert, no distress and comfortable  SKIN: skin color normal, no rashes or significant lesions EYES: normal, Conjunctiva are pink and non-injected, sclera clear  NEURO: alert & oriented x 3 with fluent speech  LABORATORY DATA:  I have reviewed the data as listed    Latest Ref Rng & Units 05/17/2023    9:41 AM 05/10/2023    8:15 AM 05/02/2023    2:52 PM  CBC  WBC 4.0 - 10.5 K/uL 9.6  6.9  6.7   Hemoglobin 13.0 - 17.0 g/dL 16.1  09.6  04.5   Hematocrit 39.0 - 52.0 % 40.5  45.2  43.6   Platelets 150 - 400 K/uL 246  362  371         Latest Ref Rng & Units 05/17/2023    9:41 AM 05/02/2023    2:52 PM 04/24/2023    3:06 PM  CMP  Glucose 70 - 99 mg/dL 409  811  914   BUN 6 - 20 mg/dL 16  18  18    Creatinine 0.61 - 1.24 mg/dL 7.82  9.56  2.13   Sodium 135 - 145 mmol/L 135  139  137   Potassium 3.5 - 5.1 mmol/L 4.7  4.5  4.4   Chloride 98 - 111 mmol/L 99  102  101   CO2 22 - 32 mmol/L 30  29  27    Calcium 8.9 - 10.3 mg/dL 9.9  08.6  9.8   Total Protein 6.5 - 8.1 g/dL 7.8  8.3  8.2   Total Bilirubin  0.3 - 1.2 mg/dL 1.5  0.6  1.1   Alkaline Phos 38 - 126 U/L 105  111  104   AST 15 - 41 U/L 20  17  32   ALT 0 - 44 U/L 15  20  25        RADIOGRAPHIC STUDIES: I have personally reviewed the radiological images as listed and agreed with the findings in the report. No results found.    Orders Placed This Encounter  Procedures   Consent Attestation for Oncology Treatment    Order Specific Question:   The patient is informed of risks, benefits, side-effects of the prescribed oncology treatment. Potential short term and long term side effects and response rates discussed. After a long discussion, the patient made informed decision to proceed.    Answer:   Yes   NM PET Image Initial (PI) Skull Base To Thigh    Standing Status:   Future    Standing Expiration Date:   05/16/2024    Order Specific Question:   If indicated for the ordered procedure, I authorize the administration of a radiopharmaceutical per Radiology protocol    Answer:   Yes    Order Specific Question:   Preferred imaging location?    Answer:   [facility]   CBC with Differential (Cancer Center Only)    Standing Status:   Future    Standing Expiration Date:   05/23/2024   CMP (Cancer Center only)    Standing Status:   Future    Standing Expiration Date:   05/23/2024   CBC with Differential (Cancer Center Only)    Standing Status:   Future    Standing Expiration Date:   06/06/2024   CMP (Cancer Center only)    Standing Status:   Future    Standing Expiration Date:   06/06/2024   CBC with Differential (Cancer Center Only)    Standing Status:   Future    Standing Expiration Date:   06/20/2024  CMP (Cancer Center only)    Standing Status:   Future    Standing Expiration Date:   06/20/2024   All questions were answered. The patient knows to call the clinic with any problems, questions or concerns. No barriers to learning was detected. The total time spent in the appointment was 60 minutes.     Malachy Mood, MD 05/17/2023   Carolin Coy, CMA, am acting as scribe for Malachy Mood, MD.   I have reviewed the above documentation for accuracy and completeness, and I agree with the above.

## 2023-05-17 ENCOUNTER — Encounter: Payer: Self-pay | Admitting: Hematology

## 2023-05-17 ENCOUNTER — Inpatient Hospital Stay: Payer: PRIVATE HEALTH INSURANCE

## 2023-05-17 ENCOUNTER — Inpatient Hospital Stay (HOSPITAL_BASED_OUTPATIENT_CLINIC_OR_DEPARTMENT_OTHER): Payer: PRIVATE HEALTH INSURANCE | Admitting: Hematology

## 2023-05-17 ENCOUNTER — Other Ambulatory Visit: Payer: Self-pay

## 2023-05-17 ENCOUNTER — Telehealth: Payer: Self-pay | Admitting: Genetic Counselor

## 2023-05-17 VITALS — BP 135/85 | HR 84 | Temp 97.9°F | Resp 18 | Ht 68.0 in | Wt 162.4 lb

## 2023-05-17 DIAGNOSIS — Z1379 Encounter for other screening for genetic and chromosomal anomalies: Secondary | ICD-10-CM

## 2023-05-17 DIAGNOSIS — C259 Malignant neoplasm of pancreas, unspecified: Secondary | ICD-10-CM

## 2023-05-17 DIAGNOSIS — R160 Hepatomegaly, not elsewhere classified: Secondary | ICD-10-CM

## 2023-05-17 DIAGNOSIS — C221 Intrahepatic bile duct carcinoma: Secondary | ICD-10-CM | POA: Diagnosis not present

## 2023-05-17 DIAGNOSIS — C787 Secondary malignant neoplasm of liver and intrahepatic bile duct: Secondary | ICD-10-CM

## 2023-05-17 LAB — CMP (CANCER CENTER ONLY)
ALT: 15 U/L (ref 0–44)
AST: 20 U/L (ref 15–41)
Albumin: 4.4 g/dL (ref 3.5–5.0)
Alkaline Phosphatase: 105 U/L (ref 38–126)
Anion gap: 6 (ref 5–15)
BUN: 16 mg/dL (ref 6–20)
CO2: 30 mmol/L (ref 22–32)
Calcium: 9.9 mg/dL (ref 8.9–10.3)
Chloride: 99 mmol/L (ref 98–111)
Creatinine: 0.82 mg/dL (ref 0.61–1.24)
GFR, Estimated: >60 mL/min (ref >60)
Glucose, Bld: 167 mg/dL — ABNORMAL HIGH (ref 70–99)
Potassium: 4.7 mmol/L (ref 3.5–5.1)
Sodium: 135 mmol/L (ref 135–145)
Total Bilirubin: 1.5 mg/dL — ABNORMAL HIGH (ref 0.3–1.2)
Total Protein: 7.8 g/dL (ref 6.5–8.1)

## 2023-05-17 LAB — CBC WITH DIFFERENTIAL (CANCER CENTER ONLY)
Abs Immature Granulocytes: 0.04 10*3/uL (ref 0.00–0.07)
Basophils Absolute: 0 10*3/uL (ref 0.0–0.1)
Basophils Relative: 0 %
Eosinophils Absolute: 0 10*3/uL (ref 0.0–0.5)
Eosinophils Relative: 0 %
HCT: 40.5 % (ref 39.0–52.0)
Hemoglobin: 14.1 g/dL (ref 13.0–17.0)
Immature Granulocytes: 0 %
Lymphocytes Relative: 11 %
Lymphs Abs: 1.1 10*3/uL (ref 0.7–4.0)
MCH: 30.7 pg (ref 26.0–34.0)
MCHC: 34.8 g/dL (ref 30.0–36.0)
MCV: 88 fL (ref 80.0–100.0)
Monocytes Absolute: 1 10*3/uL (ref 0.1–1.0)
Monocytes Relative: 10 %
Neutro Abs: 7.5 10*3/uL (ref 1.7–7.7)
Neutrophils Relative %: 79 %
Platelet Count: 246 10*3/uL (ref 150–400)
RBC: 4.6 MIL/uL (ref 4.22–5.81)
RDW: 12.1 % (ref 11.5–15.5)
WBC Count: 9.6 10*3/uL (ref 4.0–10.5)
nRBC: 0 % (ref 0.0–0.2)

## 2023-05-17 LAB — GENETIC SCREENING ORDER

## 2023-05-17 LAB — MISCELLANEOUS TEST

## 2023-05-17 MED ORDER — PROCHLORPERAZINE MALEATE 10 MG PO TABS
10.0000 mg | ORAL_TABLET | Freq: Four times a day (QID) | ORAL | 1 refills | Status: DC | PRN
Start: 2023-05-17 — End: 2023-08-28

## 2023-05-17 MED ORDER — ONDANSETRON HCL 8 MG PO TABS
8.0000 mg | ORAL_TABLET | Freq: Three times a day (TID) | ORAL | 1 refills | Status: DC | PRN
Start: 2023-05-17 — End: 2023-08-28

## 2023-05-17 MED ORDER — LIDOCAINE-PRILOCAINE 2.5-2.5 % EX CREA
TOPICAL_CREAM | CUTANEOUS | 3 refills | Status: DC
Start: 2023-05-17 — End: 2023-09-25

## 2023-05-17 NOTE — Progress Notes (Signed)
Email sent to Lyndee Hensen and Lita Mains at [facility] Pathology department requesting MMR testing on Accession:  [id number]

## 2023-05-17 NOTE — Progress Notes (Signed)
I met with David [name], his wife, and son after  his appt with Dr Mosetta Putt. I explained the services provided at [facility] and provided written information.  I explained the alight grant and let  him know he will receive more information regarding that at the time of his chemo education class.  I briefly explained insertion and care of a port a cath.  I showed a sample of the port a cath.  I let them know the interventional radiology department will place that and he will receive a call from them to schedule placement. We will arrange for this to take place after his vacation and appt with [facility].   I briefly reviewed common side effects associated with the recommended chemotherapy regimen,  FOLFIRINOX.  I told him that he will be scheduled for chemotherapy education class prior to receiving chemotherapy. He requested a referral to our dieticians.  All questions were answered. They verbalized understanding.

## 2023-05-17 NOTE — Telephone Encounter (Signed)
[employer]ae Multi-Cancer Panel was ordered for POC testing.   Lalla Brothers, MS, [facility] Genetic Counselor [email or web address] (P) [phone]

## 2023-05-17 NOTE — Progress Notes (Signed)
START ON PATHWAY REGIMEN - Pancreatic Adenocarcinoma     A cycle is every 14 days:     Irinotecan      Oxaliplatin      Leucovorin      Fluorouracil   **Always confirm dose/schedule in your pharmacy ordering system**  Patient Characteristics: Metastatic Disease, First Line, PS = 0,1, BRCA1/2 and PALB2  Mutation Absent/Unknown Therapeutic Status: Metastatic Disease Line of Therapy: First Line ECOG Performance Status: 0 BRCA1/2 Mutation Status: Awaiting Test Results PALB2 Mutation Status: Awaiting Test Results Intent of Therapy: Non-Curative / Palliative Intent, Discussed with Patient 

## 2023-05-17 NOTE — Progress Notes (Signed)
NGS and UGT1A1 testing requested via [employer] on line portal.  Order [id number].

## 2023-05-18 ENCOUNTER — Other Ambulatory Visit: Payer: Self-pay

## 2023-05-19 ENCOUNTER — Encounter: Payer: Self-pay | Admitting: Hematology

## 2023-05-20 ENCOUNTER — Telehealth: Payer: Self-pay | Admitting: Hematology

## 2023-05-20 NOTE — Telephone Encounter (Signed)
Called patient to schedule appointment. Patient is aware of upcoming appointment dates/times.

## 2023-05-21 ENCOUNTER — Other Ambulatory Visit: Payer: Self-pay

## 2023-05-21 LAB — SURGICAL PATHOLOGY

## 2023-05-21 NOTE — Progress Notes (Signed)
I spoke with David [name] regarding Dr Latanya Maudlin messaging with [facility].  [facility] does not have any sooner availability.  I offered referral to [facility] per Dr Latanya Maudlin email.  He declined.  He states he is feeling well.  I told him to let us know if he should have any symptoms.  All questions were answered.  he verbalized understanding.

## 2023-05-22 ENCOUNTER — Other Ambulatory Visit: Payer: Self-pay

## 2023-05-22 ENCOUNTER — Telehealth: Payer: Self-pay | Admitting: Hematology

## 2023-05-22 NOTE — Progress Notes (Signed)
The proposed treatment discussed in conference is for discussion purpose only and is not a binding recommendation.  The patients have not been physically examined, or presented with their treatment options.  Therefore, final treatment plans cannot be decided.  

## 2023-05-22 NOTE — Telephone Encounter (Signed)
Patient wanted to cancel appointment for Patient Education due to having a second opinion, Patient is also wanting to reschedule appointment once they have come to a decision to their plans. Patient is also wanting to transfer his treatments, patient education, labs to the center closet to his home, but still wanted to remain under Dr. Mosetta Putt care and continue their appointments. We will contact nurse navigator for more information.

## 2023-05-25 ENCOUNTER — Other Ambulatory Visit: Payer: Self-pay

## 2023-05-31 ENCOUNTER — Encounter: Payer: Self-pay | Admitting: Genetic Counselor

## 2023-05-31 ENCOUNTER — Encounter: Payer: Self-pay | Admitting: Hematology

## 2023-05-31 DIAGNOSIS — Z1379 Encounter for other screening for genetic and chromosomal anomalies: Secondary | ICD-10-CM | POA: Insufficient documentation

## 2023-06-03 ENCOUNTER — Other Ambulatory Visit: Payer: BC Managed Care – PPO

## 2023-06-03 ENCOUNTER — Encounter: Payer: Self-pay | Admitting: Hematology

## 2023-06-04 ENCOUNTER — Encounter (HOSPITAL_COMMUNITY): Payer: Self-pay | Admitting: Hematology

## 2023-06-04 ENCOUNTER — Ambulatory Visit (HOSPITAL_COMMUNITY)
Admission: RE | Admit: 2023-06-04 | Discharge: 2023-06-04 | Disposition: A | Discharge status: Home / Self Care | Payer: PRIVATE HEALTH INSURANCE | Source: Ambulatory Visit | Attending: Hematology | Admitting: Hematology

## 2023-06-04 DIAGNOSIS — C787 Secondary malignant neoplasm of liver and intrahepatic bile duct: Secondary | ICD-10-CM | POA: Insufficient documentation

## 2023-06-04 DIAGNOSIS — C259 Malignant neoplasm of pancreas, unspecified: Secondary | ICD-10-CM | POA: Diagnosis present

## 2023-06-04 LAB — GLUCOSE, CAPILLARY: Glucose-Capillary: 136 mg/dL — ABNORMAL HIGH (ref 70–99)

## 2023-06-04 MED ORDER — FLUDEOXYGLUCOSE F - 18 (FDG) INJECTION
8.1000 | Freq: Once | INTRAVENOUS | Status: AC
  Administered 2023-06-04: 8.1 via INTRAVENOUS

## 2023-06-04 NOTE — Progress Notes (Signed)
Mr Brocks called.  He will proceed with treatment here at [facility].  High priority message sent to Dr Mosetta Putt.

## 2023-06-04 NOTE — Progress Notes (Signed)
I spoke with David [name] regarding his request to get chemotherapy at [facility] and continue to have Dr Mosetta Putt as his oncologist.  I explaine dto him if he wanted to get his chemotherapy treatments at [facility] location he would need to transfer his care to Dr Felecia Jan.  I explained the flow of labs, clinic visit and treatment.  I told him we would support him in his decision and that I would facilitate his transfer of care if that is what he decides. He will discuss this with his family and let me know his decision.  All questions were answered.  He verbalized understanding.

## 2023-06-05 ENCOUNTER — Other Ambulatory Visit (HOSPITAL_COMMUNITY): Payer: BC Managed Care – PPO

## 2023-06-05 ENCOUNTER — Telehealth: Payer: Self-pay | Admitting: Hematology

## 2023-06-05 ENCOUNTER — Encounter: Payer: Self-pay | Admitting: Hematology

## 2023-06-05 ENCOUNTER — Other Ambulatory Visit: Payer: Self-pay | Admitting: Hematology

## 2023-06-05 ENCOUNTER — Other Ambulatory Visit: Payer: Self-pay | Admitting: Radiology

## 2023-06-05 NOTE — H&P (Signed)
Referring Physician(s): Feng,Yan  Supervising Physician: Simonne Come  Patient Status:  [facility] OP  Chief Complaint: "I'm here for a port a cath"   Subjective: Patient known to IR team from liver lesion biopsy on 05/10/2023.  He is a 57 year old male with past medical history significant for diabetes and recently diagnosed metastatic adenocarcinoma to the liver,? pancreatic origin.  He is scheduled today for Port-A-Cath placement to assist with treatment.  Past Medical History:  Diagnosis Date   DM (diabetes mellitus) (HCC)    Past Surgical History:  Procedure Laterality Date   FINGER AMPUTATION        Allergies: Patient has no known allergies.  Medications: Prior to Admission medications   Medication Sig Start Date End Date Taking? Authorizing Provider  baclofen (LIORESAL) 10 MG tablet Take 10 mg by mouth 3 (three) times daily as needed.    [provider]  Continuous Glucose Sensor (DEXCOM G7 SENSOR) MISC USE AS DIRECTED TO CHECK GLUCOSE - CHANGE SENSOR EVERY 10 DAYS Patient not taking: Reported on 04/24/2023    [provider]  dapagliflozin propanediol (FARXIGA) 5 MG TABS tablet Take 5 mg by mouth daily. 02/18/23   [provider]  HYDROcodone-acetaminophen (NORCO/VICODIN) 5-325 MG tablet Take 1 tablet by mouth every 4 (four) hours as needed. 04/24/23   Jacalyn Lefevre, MD  HYDROcodone-acetaminophen (NORCO/VICODIN) 5-325 MG tablet Take 2 tablets by mouth every 4 (four) hours as needed. 04/24/23   Jacalyn Lefevre, MD  ibuprofen (ADVIL) 800 MG tablet Take by mouth.    [provider]  lidocaine-prilocaine (EMLA) cream Apply to affected area once 05/17/23   Malachy Mood, MD  metFORMIN (GLUCOPHAGE-XR) 500 MG 24 hr tablet Take 500 mg by mouth every evening. 02/18/23   [provider]  ondansetron (ZOFRAN) 8 MG tablet Take 1 tablet (8 mg total) by mouth every 8 (eight) hours as needed for nausea or vomiting. Start on the third day after cisplatin  05/17/23   Malachy Mood, MD  prochlorperazine (COMPAZINE) 10 MG tablet Take 1 tablet (10 mg total) by mouth every 6 (six) hours as needed for nausea or vomiting (Nausea or vomiting). 05/17/23   Malachy Mood, MD  valACYclovir (VALTREX) 500 MG tablet as needed.    [provider]  zolpidem (AMBIEN) 10 MG tablet Take by mouth. 02/18/23   [provider]     Vital Signs:   Code Status:   Physical Exam  Imaging: No results found.  Labs:  CBC: Recent Labs    04/24/23 1506 05/02/23 1452 05/10/23 0815 05/17/23 0941  WBC 9.3 6.7 6.9 9.6  HGB 15.0 14.9 15.2 14.1  HCT 44.4 43.6 45.2 40.5  PLT 259 371 362 246    COAGS: Recent Labs    05/02/23 1452 05/10/23 0815  INR 0.9 0.9    BMP: Recent Labs    04/24/23 1506 05/02/23 1452 05/17/23 0941  NA 137 139 135  K 4.4 4.5 4.7  CL 101 102 99  CO2 27 29 30   GLUCOSE 198* 121* 167*  BUN 18 18 16   CALCIUM 9.8 10.0 9.9  CREATININE 1.05 0.92 0.82  GFRNONAA >60 >60 >60    LIVER FUNCTION TESTS: Recent Labs    04/24/23 1506 05/02/23 1452 05/17/23 0941  BILITOT 1.1 0.6 1.5*  AST 32 17 20  ALT 25 20 15   ALKPHOS 104 111 105  PROT 8.2* 8.3* 7.8  ALBUMIN 4.6 4.8 4.4    Assessment and Plan: 57 year old male with past medical history  significant for diabetes and recently diagnosed metastatic adenocarcinoma to the liver,? pancreatic origin.  He is scheduled today for Port-A-Cath placement to assist with treatment.Risks and benefits of image guided port-a-catheter placement was discussed with the patient including, but not limited to bleeding, infection, pneumothorax, or fibrin sheath development and need for additional procedures.  All of the patient's questions were answered, patient is agreeable to proceed. Consent signed and in chart.    Electronically Signed: D. Jeananne Rama, PA-C 06/05/2023, 4:23 PM   I spent a total of 25 minutes at the the patient's bedside AND on the patient's hospital floor or unit,  greater than 50% of which was counseling/coordinating care for Port-A-Cath placement

## 2023-06-05 NOTE — Progress Notes (Signed)
[facility] OFFICE PROGRESS NOTE  Samuella Bruin [address] [place] [address]  DIAGNOSIS: Stage IV Pancreatic adenocarcinoma   Oncology History  Pancreatic cancer metastasized to liver (HCC)  05/05/2023 Imaging   MR ABDOMEN W/WO CONTRAST   IMPRESSION: 1. Multiple rim hypoenhancing liver lesions, largest again in the superior liver dome measuring 8.0 x 7.3 cm with multiple small satellite lesions, additional lesions in hepatic segment IVB and V. Findings are most consistent with hepatic metastatic disease.   2. Appearance of the tip of the pancreatic tail is suspicious for a small mass, concerning for pancreatic adenocarcinoma. No other findings to suggest primary lesion in the abdomen.     05/10/2023 Pathology Results    FINAL MICROSCOPIC DIAGNOSIS:   A. LIVER, NEEDLE CORE BIOPSY:       Adenocarcinoma.    05/16/2023 Initial Diagnosis   Pancreatic cancer metastasized to liver [facility])   06/11/2023 -  Chemotherapy   Patient is on Treatment Plan : PANCREAS Modified FOLFIRINOX q14d x 4 cycles      Genetic Testing   [facility] Multi-Cancer Panel+RNA was Negative. 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 Report date is 05/27/2023.   The Multi-Cancer + RNA Panel offered by [facility] includes sequencing and/or deletion/duplication analysis of the following 70 genes:  AIP*, ALK, APC*, ATM*, AXIN2*, BAP1*, BARD1*, BLM*, BMPR1A*, BRCA1*, BRCA2*, BRIP1*, CDC73*, CDH1*, CDK4, CDKN1B*, CDKN2A, CHEK2*, CTNNA1*, DICER1*, EPCAM (del/dup only), EGFR, FH*, FLCN*, GREM1 (promoter dup only), HOXB13, KIT, LZTR1, MAX*, MBD4, MEN1*, MET, MITF, MLH1*, MSH2*, MSH3*, MSH6*, MUTYH*, NF1*, NF2*, NTHL1*, PALB2*, PDGFRA, PMS2*, POLD1*, POLE*, POT1*, PRKAR1A*, PTCH1*, PTEN*, RAD51C*, RAD51D*, RB1*, RET, SDHA* (sequencing only), SDHAF2*, SDHB*, SDHC*, SDHD*, SMAD4*, SMARCA4*, SMARCB1*, SMARCE1*, STK11*, SUFU*, TMEM127*, TP53*, TSC1*, TSC2*, VHL*.  RNA analysis is performed for * genes.     CURRENT THERAPY: FOLFIRINOX, first dose tentatively scheduled for 06/11/2023.   INTERVAL HISTORY: David [name] 57 y.o. male returns to the clinic today for a follow-up visit companied by his wife and son.  The patient was last seen by Dr. Mosetta Putt on 05/17/2023 for what was felt to be newly diagnosed stage IV pancreatic cancer. He presented with multiple liver lesions and a small mass in the pancreatic tail. In the interval since last being seen, the patient had had genetic testing performed which did not show any actionable mutations.  He also had a second opinion with Dr. Berton Mount.  He was in agreement with FOLFIRINOX for palliative intent.  He has a chemoeducation appointment on 06/10/23 and he is scheduled for his first treatment next week. The patient also had a PET scan for the staging workup.  Dr. Latanya Maudlin last note mention that if there is no significant activity in the pancreatic tail lesion, that she may consider biopsy of this lesion to ensure that this is pancreatic adenocarcinoma versus other primary adenocarcinoma such as cholangiocarcinoma.  He is scheduled to have his Port-A-Cath placed today.  He has a prescription for Emla cream.  Overall, the patient states he feels well today.  He denies any recent fever, chills, or night sweats.  He had unintentionally been losing weight prior to his diagnosis but his appetite and weight have been holding steady since he was last seen.  A referral was placed on 05/17/2023 to member the nutritionist team.   Denies any jaundice or itching.  Denies any nausea, vomiting, diarrhea, or constipation.  He previously had intermittent shoulder which has resolved  at this time.  He has not had any shoulder pain in 2 to 3 weeks.  He denies any abdominal pain.  He is here today for evaluation and for a more detailed discussion about his current condition and next steps.   MEDICAL HISTORY: Past Medical History:  Diagnosis Date    DM (diabetes mellitus) (HCC)     ALLERGIES:  has No Known Allergies.  MEDICATIONS:  Current Outpatient Medications  Medication Sig Dispense Refill   baclofen (LIORESAL) 10 MG tablet Take 10 mg by mouth 3 (three) times daily as needed.     Continuous Glucose Sensor (DEXCOM G7 SENSOR) MISC USE AS DIRECTED TO CHECK GLUCOSE - CHANGE SENSOR EVERY 10 DAYS (Patient not taking: Reported on 04/24/2023)     dapagliflozin propanediol (FARXIGA) 5 MG TABS tablet Take 5 mg by mouth daily.     HYDROcodone-acetaminophen (NORCO/VICODIN) 5-325 MG tablet Take 1 tablet by mouth every 4 (four) hours as needed. 10 tablet 0   HYDROcodone-acetaminophen (NORCO/VICODIN) 5-325 MG tablet Take 2 tablets by mouth every 4 (four) hours as needed. 6 tablet 0   ibuprofen (ADVIL) 800 MG tablet Take by mouth.     lidocaine-prilocaine (EMLA) cream Apply to affected area once 30 g 3   metFORMIN (GLUCOPHAGE-XR) 500 MG 24 hr tablet Take 500 mg by mouth every evening.     ondansetron (ZOFRAN) 8 MG tablet Take 1 tablet (8 mg total) by mouth every 8 (eight) hours as needed for nausea or vomiting. Start on the third day after cisplatin 30 tablet 1   prochlorperazine (COMPAZINE) 10 MG tablet Take 1 tablet (10 mg total) by mouth every 6 (six) hours as needed for nausea or vomiting (Nausea or vomiting). 30 tablet 1   valACYclovir (VALTREX) 500 MG tablet as needed.     zolpidem (AMBIEN) 10 MG tablet Take by mouth.     No current facility-administered medications for this visit.    SURGICAL HISTORY:  Past Surgical History:  Procedure Laterality Date   FINGER AMPUTATION      REVIEW OF SYSTEMS:   Review of Systems  Constitutional: Negative for appetite change, chills, fatigue, fever and unexpected weight change.  HENT: Negative for mouth sores, nosebleeds, sore throat and trouble swallowing.   Eyes: Negative for eye problems and icterus.  Respiratory: Negative for cough, hemoptysis, shortness of breath and wheezing.    Cardiovascular: Negative for chest pain and leg swelling.  Gastrointestinal: Negative for abdominal pain, constipation, diarrhea, nausea and vomiting.  Genitourinary: Negative for bladder incontinence, difficulty urinating, dysuria, frequency and hematuria.   Musculoskeletal: Occasional shoulder pain (none recently). Negative for back pain, gait problem, neck pain and neck stiffness.  Skin: Negative for itching and rash.  Neurological: Negative for dizziness, extremity weakness, gait problem, headaches, light-headedness and seizures.  Hematological: Negative for adenopathy. Does not bruise/bleed easily.  Psychiatric/Behavioral: Negative for confusion, depression and sleep disturbance. The patient is not nervous/anxious.     PHYSICAL EXAMINATION:  There were no vitals taken for this visit.  ECOG PERFORMANCE STATUS: 1  Physical Exam  Constitutional: Oriented to person, place, and time and well-developed, well-nourished, and in no distress. HENT:  Head: Normocephalic and atraumatic.  Mouth/Throat: Oropharynx is clear and moist. No oropharyngeal exudate.  Eyes: Conjunctivae are normal. Right eye exhibits no discharge. Left eye exhibits no discharge. No scleral icterus.  Neck: Normal range of motion. Neck supple.  Cardiovascular: Normal rate, regular rhythm, normal heart sounds and intact distal pulses.   Pulmonary/Chest: Effort normal and breath  sounds normal. No respiratory distress. No wheezes. No rales.  Abdominal: Soft. Bowel sounds are normal. Exhibits no distension and no mass. There is no tenderness.  Musculoskeletal: Normal range of motion. Exhibits no edema.  Lymphadenopathy:    No cervical adenopathy.  Neurological: Alert and oriented to person, place, and time. Exhibits normal muscle tone. Gait normal. Coordination normal.  Skin: Skin is warm and dry. No rash noted. Not diaphoretic. No erythema. No pallor.  Psychiatric: Mood, memory and judgment normal.  Vitals  reviewed.  LABORATORY DATA: Lab Results  Component Value Date   WBC 9.6 05/17/2023   HGB 14.1 05/17/2023   HCT 40.5 05/17/2023   MCV 88.0 05/17/2023   PLT 246 05/17/2023      Chemistry      Component Value Date/Time   NA 135 05/17/2023 0941   K 4.7 05/17/2023 0941   CL 99 05/17/2023 0941   CO2 30 05/17/2023 0941   BUN 16 05/17/2023 0941   CREATININE 0.82 05/17/2023 0941   GLU 120 08/04/2019 0000      Component Value Date/Time   CALCIUM 9.9 05/17/2023 0941   ALKPHOS 105 05/17/2023 0941   AST 20 05/17/2023 0941   ALT 15 05/17/2023 0941   BILITOT 1.5 (H) 05/17/2023 0941       RADIOGRAPHIC STUDIES:  US BIOPSY (LIVER)  Result Date: 05/10/2023 INDICATION: Liver mass EXAM: ULTRASOUND BIOPSY CORE LIVER MEDICATIONS: None. ANESTHESIA/SEDATION: Moderate (conscious) sedation was employed during this procedure. A total of Versed 2 mg and Fentanyl 100 mcg was administered intravenously. Moderate Sedation Time: 11 minutes. The patient's level of consciousness and vital signs were monitored continuously by radiology nursing throughout the procedure under my direct supervision. FLUOROSCOPY TIME:  None. COMPLICATIONS: None immediate. PROCEDURE: Informed written consent was obtained from the patient after a thorough discussion of the procedural risks, benefits and alternatives. All questions were addressed. Maximal Sterile Barrier Technique was utilized including caps, mask, sterile gowns, sterile gloves, sterile drape, hand hygiene and skin antiseptic. A timeout was performed prior to the initiation of the procedure. The right upper quadrant was interrogated with ultrasound. The large mass in the right hemi liver was successfully identified. A suitable skin entry site was selected and marked. The region was sterilely prepped and draped in the standard fashion using chlorhexidine skin prep. Local anesthesia was attained by infiltration with 1% lidocaine. A small dermatotomy was made. Under  real-time sonographic guidance, a 17 gauge introducer needle was advanced into the margin of the mass. Multiple 18 gauge biopsies were then coaxially obtained using the BioPince automated biopsy device. Biopsy samples were placed in formalin and delivered to pathology for further analysis. As a 17 gauge introducer needle was removed, the biopsy tract was embolized with a Gel-Foam slurry. Post biopsy ultrasound imaging demonstrates no evidence of active hemorrhage or perihepatic hematoma. The patient tolerated the procedure well. IMPRESSION: Technically successful ultrasound-guided core biopsy of hepatic lesion. Electronically Signed   By: Malachy Moan M.D.   On: 05/10/2023 10:52     ASSESSMENT/PLAN:  David [name] is a 57 y.o. male with    Stage IV Pancreatic cancer metastasized to liver (HCC) vs Cholangiocarcinoma. Diagnosed in May 2024.  --He presented with sudden onset right upper quadrant pain, and some right shoulder referring pain.  He was evaluated in the emergency room on Apr 24, 2023.  CT chest, abdomen pelvis reviewed no evidence of PE, a large 7.7 x 6.3 cm mass in the dome of the liver, with at least 3  additional smaller lesions in both hepatic lobes.  No evidence of liver cirrhosis, no other primary tumor seen on CT scans.  -abdominal MRI with and without contrast from May 05, 2023 showed multiple liver lesions, which largest 8 cm in the dome and a 2.3 cm mass in the tail of pancreas. -Liver biopsy pathology findings, which showed adenocarcinoma with lymphovascular invasion, CK7 strongly positive, patchy strong positive CDX2.  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 -MMR normal -Baseline CA 19-9 elevated at 495 -He did have a second option with Dr. Berton Mount from [facility] who is in agreement with FOLFIRNOX G-CSF support in the setting of his young age and good health, in order to achieve maximal disease control. He recommends q 3 month repeat  imaging. The patient opted to have this performed at [facility] but receive treatment locally here at [facility]  -Today (06/06/23), the patient was seen with Dr. Mosetta Putt. The patient recent had a PET scan showing metabolism in the liver lesions consistent with metastatic disease.  However, there is only low-level FDG uptake in the soft tissue fullness at the tip of the pancreatic tail.  There is also small soft tissue nodule between the gastric fundus and spleen demonstrate low-level activity.  There is a small focus of radiotracer accumulation on the roof of the left acetabulum without underlying CT lesion which could be degenerative and attention to follow-up was recommended. -Given the low-level activity in the pancreatic lesion, Dr. Mosetta Putt still feels like she cannot say with 100% certainty that this pancreatic cancer versus cholangiocarcinoma as she would have expected more hypermetabolism in the primary lesion.  -Therefore, Dr. Mosetta Putt recommends EUS and biopsy of the pancreatic lesion. The patient is agreeable.  She will reach out to Dr. Meridee Score or Dr. Elnoria Howard to see their availability for EUS. -Dr. Mosetta Putt discussed that it may take 2 to 3 weeks to have the EUS performed.  She does not want to delay starting treatment.  She did recommend getting chemo education class next week and starting the first treatment as scheduled on 06/11/2023 unless the patient were to undergo EUS next week.  If the patient were able to have EUS next week, then we would delay starting treatment until the first week of July.  -Dr. Mosetta Putt explained it is okay to proceed with FOLFIRINOX as FOLFIRINOX also can be used and cholangiocarcinoma. However, if the biopsy does not show pancreatic cancer, then she likely would change treatment to first like treatment for cholangiocarcinoma such as gemcitabine, cisplatin, and immunotherapy. -The [employer] next generation sequencing did not show any actionable mutations. -I have added G-CSF injection on day 3 per Dr.  Berton Mount recommendation. Instructed to take claritin 4-7 days after GCSF injection.  --Chemotherapy consent: Side effects including but does not not limited to, fatigue, nausea, vomiting, diarrhea, hair loss, neuropathy, fluid retention, renal and kidney dysfunction, cold sensitivity, neutropenic fever, needed for blood transfusion, bleeding, were discussed with patient in great detail. He agrees to proceed. -The goal of chemotherapy is palliative, to prolong his life and improve cancer-related symptoms. -Scheduled for port placement today. Reviewed EMLA cream.  -Reviewed anti-emetics   PLAN: -The patient was seen with Dr. Mosetta Putt -Reviewed PET scan  -Dr. Mosetta Putt will reach out to Dr. Elnoria Howard or Dr. Meridee Score for consideration of EUS. Referral placed.  -Port placement today. Reviewed EMLA, Zofran, and Compazine use -Chemoeducation 06/10/23 as scheduled  -First-line chemotherapy with FOLFIRINOX on 06/11/23 as scheduled, unless he can get EUS next  week, in which we would delay starting treatment by one week -He will follow with Dr. Berton Mount from [facility] q 3 months for repeat imaging per patient request -Reviewed tempus results     No orders of the defined types were placed in this encounter.    Verneta Hamidi L Jozy Mcphearson, PA-C 06/05/23  Addendum I have seen the patient, examined him. I agree with the assessment and and plan and have edited the notes.   I personally reviewed the PET scan images, and discussed the findings with patient and his family.  He has strongly hypermetabolic dominant liver lesion, with additional smaller lesions in both hepatic lobes, the pancreatic lesion however is only mildly hypermetabolic with SUV 2.2.  The image finding is not typical for metastatic pancreatic cancer, cholangiocarcinoma is more favored based on image.  I recommend a EUS biopsy of the pancreas lesion, to help the differentiation of the primary, and define the appropriate first-line chemotherapy.  Given the  metastatic lesions in the liver, his disease is not surgically resectable, even this is cholangiocarcinoma.  If pancreatic biopsy is negative, I recommend to change chemotherapy regimen to cisplatin, gemcitabine and durvalumab.  Chemo consent obtained, he understands the treatment is palliative for disease control, and prolong his life.  All questions were answered, patient agrees to proceed.  Will reach out to GI for urgent EUS and biopsy.   Malachy Mood MD 06/06/2023

## 2023-06-05 NOTE — Telephone Encounter (Signed)
Contacted patient to scheduled appointments. Patient is aware of appointments that are scheduled.   

## 2023-06-06 ENCOUNTER — Other Ambulatory Visit: Payer: Self-pay

## 2023-06-06 ENCOUNTER — Other Ambulatory Visit: Payer: Self-pay | Admitting: Gastroenterology

## 2023-06-06 ENCOUNTER — Telehealth: Payer: Self-pay | Admitting: Gastroenterology

## 2023-06-06 ENCOUNTER — Other Ambulatory Visit: Payer: Self-pay | Admitting: Physician Assistant

## 2023-06-06 ENCOUNTER — Encounter (HOSPITAL_COMMUNITY): Payer: Self-pay

## 2023-06-06 ENCOUNTER — Telehealth: Payer: Self-pay | Admitting: Physician Assistant

## 2023-06-06 ENCOUNTER — Ambulatory Visit (HOSPITAL_COMMUNITY)
Admission: RE | Admit: 2023-06-06 | Discharge: 2023-06-06 | Disposition: A | Discharge status: Home / Self Care | Payer: PRIVATE HEALTH INSURANCE | Source: Ambulatory Visit | Attending: Hematology | Admitting: Hematology

## 2023-06-06 ENCOUNTER — Inpatient Hospital Stay: Payer: PRIVATE HEALTH INSURANCE | Attending: Hematology | Admitting: Physician Assistant

## 2023-06-06 ENCOUNTER — Other Ambulatory Visit: Payer: BC Managed Care – PPO

## 2023-06-06 DIAGNOSIS — Z800 Family history of malignant neoplasm of digestive organs: Secondary | ICD-10-CM | POA: Insufficient documentation

## 2023-06-06 DIAGNOSIS — Z7984 Long term (current) use of oral hypoglycemic drugs: Secondary | ICD-10-CM | POA: Insufficient documentation

## 2023-06-06 DIAGNOSIS — R61 Generalized hyperhidrosis: Secondary | ICD-10-CM | POA: Insufficient documentation

## 2023-06-06 DIAGNOSIS — C259 Malignant neoplasm of pancreas, unspecified: Secondary | ICD-10-CM | POA: Insufficient documentation

## 2023-06-06 DIAGNOSIS — Z801 Family history of malignant neoplasm of trachea, bronchus and lung: Secondary | ICD-10-CM | POA: Insufficient documentation

## 2023-06-06 DIAGNOSIS — C787 Secondary malignant neoplasm of liver and intrahepatic bile duct: Secondary | ICD-10-CM | POA: Diagnosis present

## 2023-06-06 DIAGNOSIS — C252 Malignant neoplasm of tail of pancreas: Secondary | ICD-10-CM | POA: Diagnosis present

## 2023-06-06 DIAGNOSIS — E119 Type 2 diabetes mellitus without complications: Secondary | ICD-10-CM | POA: Insufficient documentation

## 2023-06-06 HISTORY — PX: IR IMAGING GUIDED PORT INSERTION: IMG5740

## 2023-06-06 MED ORDER — FENTANYL CITRATE (PF) 100 MCG/2ML IJ SOLN
INTRAMUSCULAR | Status: AC
  Filled 2023-06-06: qty 2

## 2023-06-06 MED ORDER — LIDOCAINE-EPINEPHRINE 1 %-1:100000 IJ SOLN
INTRAMUSCULAR | Status: AC
  Filled 2023-06-06: qty 1

## 2023-06-06 MED ORDER — FENTANYL CITRATE (PF) 100 MCG/2ML IJ SOLN
INTRAMUSCULAR | Status: AC | PRN
  Administered 2023-06-06 (×2): 50 ug via INTRAVENOUS

## 2023-06-06 MED ORDER — MIDAZOLAM HCL 2 MG/2ML IJ SOLN
INTRAMUSCULAR | Status: AC
  Filled 2023-06-06: qty 2

## 2023-06-06 MED ORDER — MIDAZOLAM HCL 2 MG/2ML IJ SOLN
INTRAMUSCULAR | Status: AC | PRN
  Administered 2023-06-06 (×2): 1 mg via INTRAVENOUS

## 2023-06-06 MED ORDER — LIDOCAINE-EPINEPHRINE 1 %-1:100000 IJ SOLN
20.0000 mL | Freq: Once | INTRAMUSCULAR | Status: AC
  Administered 2023-06-06: 18 mL via INTRADERMAL

## 2023-06-06 MED ORDER — HEPARIN SOD (PORK) LOCK FLUSH 100 UNIT/ML IV SOLN
INTRAVENOUS | Status: AC
  Filled 2023-06-06: qty 5

## 2023-06-06 MED ORDER — SODIUM CHLORIDE 0.9 % IV SOLN: INTRAVENOUS | Status: DC

## 2023-06-06 MED ORDER — HEPARIN SOD (PORK) LOCK FLUSH 100 UNIT/ML IV SOLN
500.0000 [IU] | Freq: Once | INTRAVENOUS | Status: AC
  Administered 2023-06-06: 500 [IU] via INTRAVENOUS

## 2023-06-06 NOTE — Telephone Encounter (Signed)
Dr. Meridee Score,  Urgent referral in [place] from oncology for possible EUS. (Records in [facility]).  Please review and advise scheduling.  Thanks [employer]

## 2023-06-06 NOTE — Patient Instructions (Addendum)
Summary: -It was very nice meeting you today. Here is a summary of the main take away points. -Dr. Mosetta Putt reviewed the PET scan. The spot in the area of the pancreas only had low level activity. Dr. Mosetta Putt would have expected that with this being the primary cancer, that it would have had more activity. Therefore, this raises the question if this is truly primary pancreatic cancer vs bile duct cancer (called cholangiocarcinoma). Both pancreatic cancer and cholangiocarcinoma are "adenocarcinoma".  -Therefore, Dr. Mosetta Putt thinks it would be beneficial to get a biopsy of the pancreatic spot. This procedure is called an EUS. Two doctors in town do this procedure named Dr. Meridee Score and Dr. Elnoria Howard. Dr. Mosetta Putt will reach out to them to see when they can do this procedure. Hopefully, we will find out when they can do this procedure tomorrow and will let you know.  -While biposies are not perfect, if the biopsy of the pancreas does not show cancer, Dr. Mosetta Putt would likely be leaning towards changing treatment to treatment for bile duct cancer.  -Treatment is slightly different for pancreatic and bile duct cancer. Treatment for cholangiocarcinoma includes two chemo drugs (cisplatin and gemzar) and immunotherapy is approved in bile duct cancer.  -The pancreatic treatment and bile duct have similar side effects. The pancreatic treatment has a little more diarrhea and cold sensitivity though.  -The genetic test did not show any markers that we have standard treatment for; however, this is good information to know for consideration of clinical trials in the future.  -If they can do an EUS next week, then we will perhaps delay treatment by one week. More likely, they will do the EUS in the next 2-3 weeks, in which case, Dr. Mosetta Putt does not recommend delaying treatment as the pancreatic treatment is still effective in bile duct cancer, if that is what this ends up being.   If they cannot due the EUS next week: -The plan is to start  chemotherapy next week on 06/11/23. You are scheduled for chemoeducation class on 06/10/23. This is a very valuable appointment where you will sit down with a nurse educator one on one. She will discuss your treatment in a lot more detail, go over tips, how to manage common/possible side effects, give you a tour, and go over resources available to you here at the Cancer Center at [facility].  -You have a prescription for the numbing cream for the port.  We would arrange for you to receive a numbing cream at home that you put a quarter sized amount on top of your port 30-60 minutes before coming here and by the time you get here would be nice and numb to use. However, you cannot use the numbing cream for about 2 weeks while the incision is healing. Please use ice for the first appointment.  -Your treatment is Called FOLFIRINOX.  Medicines in this treatment are called oxaliplatin, leucovorin, 5-FU, and irinotecan. I have information on the later pages about this. Treatment is once every 2 weeks.  You will come in 2 days after treatment to have your pump detached and receive the injection we discussed. Please take claritin 4-7 days after the injection. This injection can cause bone pain/body aches for a few days. The injection tries to protect you from infection by increasing your infection fighting cells so they don't get low from chemotherapy.   - Possible side effects that we will be monitoring for are fatigue, nausea, vomiting (we give you nausea medication while you  are here and you have two at home prescriptions if needed), diarrhea (use Imodium if needed), hair loss, neuropathy (numbness/tingling in the hands and feet), fluid retention, kidney dysfunction (drink plenty of water and we monitor this), infection, needed for blood transfusion/drops in the blood count, cold sensitivity (drink room temperature drinks) and bleeding. Of course, these will all be monitored closely.    Medications: -You already have  prescriptions for Zofran to your pharmacy to take 1 tablet every 8 hours as needed for nausea and vomiting starting 3 days after chemotherapy. The reason why this may not be helpful if you take this before 3 days is because it is already in your system. You are given a stronger version of this medication on treatment days while in the infusion room -Compazine is every 6 hours as needed for nausea/vomiting. You can take this at any time.

## 2023-06-06 NOTE — Telephone Encounter (Signed)
I called the patient to let him know that we are working on adjusting his appointments.  He informed me that Dr. Elnoria Howard is planning to perform the EUS tomorrow. Let him know we will keep the chemoeducation as scheduled for Monday. We may adjust the appointment on 6/25 to allow time for the biopsy to result. We will be in touch once his schedule was finalized. He also had his port-a-cath placed today and tolerated it well.

## 2023-06-06 NOTE — Telephone Encounter (Signed)
PH, Thanks for update and being available for the [place] patients.  David [name], Please close this referral in the Queue. GM

## 2023-06-06 NOTE — Discharge Instructions (Signed)
Implanted Port Insertion, Care After  The following information offers guidance on how to care for yourself after your procedure. Your health care provider may also give you more specific instructions. If you have problems or questions, contact your health care provider.  What can I expect after the procedure? After the procedure, it is common to have: Discomfort at the port insertion site. Bruising on the skin over the port. This should improve over 3-4 days.   Urgent needs - Interventional Radiology, clinic [phone] (mon-fri 8-5).   Wound - May remove dressing and shower in 24 to 48 hours.  Keep site clean and dry.  Replace with bandaid as needed.  Do not submerge in tub or water until site healing well. If closed with glue, glue will flake off on its own.   If ordered by your provider, may start Emla cream (or any other creams ointments or lotions) in 2 weeks or after incision is healed. Port is ready for use immediately.   After completion of treatment, your provider should have you set up for monthly port flushes.   Follow these instructions at home: Port care After your port is placed, you will get a manufacturer's information card. The card has information about your port. Keep this card with you at all times. Take care of the port as told by your health care provider. Ask your health care provider if you or a family member can get training for taking care of the port at home. A home health care nurse will be be available to help care for the port. Make sure to remember what type of port you have. Incision care     Follow instructions from your health care provider about how to take care of your port insertion site. Make sure you: Wash your hands with soap and water for at least 20 seconds before and after you change your bandage (dressing). If soap and water are not available, use hand sanitizer. Change your dressing as told by your health care provider. Leave stitches  (sutures), skin glue, or adhesive strips in place. These skin closures may need to stay in place for 2 weeks or longer. If adhesive strip edges start to loosen and curl up, you may trim the loose edges. Do not remove adhesive strips completely unless your health care provider tells you to do that. Check your port insertion site every day for signs of infection. Check for: Redness, swelling, or pain. Fluid or blood. Warmth. Pus or a bad smell. Activity Return to your normal activities as told by your health care provider. Ask your health care provider what activities are safe for you. You may have to avoid lifting. Ask your health care provider how much you can safely lift. General instructions Take over-the-counter and prescription medicines only as told by your health care provider. Do not take baths, swim, or use a hot tub until your health care provider approves. Ask your health care provider if you may take showers. You may only be allowed to take sponge baths. If you were given a sedative during the procedure, it can affect you for several hours. Do not drive or operate machinery until your health care provider says that it is safe. Wear a medical alert bracelet in case of an emergency. This will tell any health care providers that you have a port. Keep all follow-up visits. This is important. Contact a health care provider if: You cannot flush your port with saline as directed, or you cannot   draw blood from the port. You have a fever or chills. You have redness, swelling, or pain around your port insertion site. You have fluid or blood coming from your port insertion site. Your port insertion site feels warm to the touch. You have pus or a bad smell coming from the port insertion site. Get help right away if: You have chest pain or shortness of breath. You have bleeding from your port that you cannot control. These symptoms may be an emergency. Get help right away. Call 911. Do not  wait to see if the symptoms will go away. Do not drive yourself to the hospital. Summary Take care of the port as told by your health care provider. Keep the manufacturer's information card with you at all times. Change your dressing as told by your health care provider. Contact a health care provider if you have a fever or chills or if you have redness, swelling, or pain around your port insertion site. Keep all follow-up visits. This information is not intended to replace advice given to you by your health care provider. Make sure you discuss any questions you have with your health care provider. Document Revised: 06/06/2021 Document Reviewed: 06/06/2021 Elsevier Patient Education  2023 [employer]    Moderate Conscious Sedation  Adult  Care After (English)  After the procedure, it is common to have: Sleepiness for a few hours. Impaired judgment for a few hours. Trouble with balance. Nausea or vomiting if you eat too soon. Follow these instructions at home: For the time period you were told by your health care provider:  Rest. Do not participate in activities where you could fall or become injured. Do not drive or use machinery. Do not drink alcohol. Do not take sleeping pills or medicines that cause drowsiness. Do not make important decisions or sign legal documents. Do not take care of children on your own. Eating and drinking Follow instructions from your health care provider about what you may eat and drink. Drink enough fluid to keep your urine pale yellow. If you vomit: Drink clear fluids slowly and in small amounts as you are able. Clear fluids include water, ice chips, low-calorie sports drinks, and fruit juice that has water added to it (diluted fruit juice). Eat light and bland foods in small amounts as you are able. These foods include bananas, applesauce, rice, lean meats, toast, and crackers. General instructions Take over-the-counter and prescription medicines  only as told by your health care provider. Have a responsible adult stay with you for the time you are told. Do not use any products that contain nicotine or tobacco. These products include cigarettes, chewing tobacco, and vaping devices, such as e-cigarettes. If you need help quitting, ask your health care provider. Return to your normal activities as told by your health care provider. Ask your health care provider what activities are safe for you. Your health care provider may give you more instructions. Make sure you know what you can and cannot do. Contact a health care provider if: You are still sleepy or having trouble with balance after 24 hours. You feel light-headed. You vomit every time you eat or drink. You get a rash. You have a fever. You have redness or swelling around the IV site. Get help right away if: You have trouble breathing. You start to feel confused at home. These symptoms may be an emergency. Get help right away. Call 911. Do not wait to see if the symptoms will go away. Do not   drive yourself to the hospital. This information is not intended to replace advice given to you by your health care provider. Make sure you discuss any questions you have with your health care provider. 

## 2023-06-06 NOTE — Procedures (Signed)
Pre Procedure Dx: Poor venous access Post Procedural Dx: Same  Successful placement of right IJ approach port-a-cath with tip at the superior caval atrial junction. The catheter is ready for immediate use.  Estimated Blood Loss: Trace  Complications: None immediate.  Jay Shailah Gibbins, MD Pager #: [phone]   

## 2023-06-07 ENCOUNTER — Telehealth: Payer: Self-pay | Admitting: Physician Assistant

## 2023-06-07 ENCOUNTER — Encounter (HOSPITAL_COMMUNITY): Payer: Self-pay | Admitting: Gastroenterology

## 2023-06-07 ENCOUNTER — Ambulatory Visit (HOSPITAL_COMMUNITY): Payer: PRIVATE HEALTH INSURANCE | Admitting: Anesthesiology

## 2023-06-07 ENCOUNTER — Encounter: Payer: Self-pay | Admitting: Physician Assistant

## 2023-06-07 ENCOUNTER — Telehealth: Payer: Self-pay

## 2023-06-07 ENCOUNTER — Encounter (HOSPITAL_COMMUNITY)
Admission: RE | Disposition: A | Discharge status: Home / Self Care | Payer: Self-pay | Source: Ambulatory Visit | Attending: Gastroenterology

## 2023-06-07 ENCOUNTER — Ambulatory Visit (HOSPITAL_COMMUNITY)
Admission: RE | Admit: 2023-06-07 | Discharge: 2023-06-07 | Disposition: A | Discharge status: Home / Self Care | Payer: PRIVATE HEALTH INSURANCE | Source: Ambulatory Visit | Attending: Gastroenterology | Admitting: Gastroenterology

## 2023-06-07 DIAGNOSIS — Z7984 Long term (current) use of oral hypoglycemic drugs: Secondary | ICD-10-CM | POA: Diagnosis not present

## 2023-06-07 DIAGNOSIS — K8689 Other specified diseases of pancreas: Secondary | ICD-10-CM | POA: Insufficient documentation

## 2023-06-07 DIAGNOSIS — C61 Malignant neoplasm of prostate: Secondary | ICD-10-CM | POA: Insufficient documentation

## 2023-06-07 DIAGNOSIS — E119 Type 2 diabetes mellitus without complications: Secondary | ICD-10-CM | POA: Diagnosis not present

## 2023-06-07 HISTORY — PX: ESOPHAGOGASTRODUODENOSCOPY: SHX5428

## 2023-06-07 HISTORY — PX: FINE NEEDLE ASPIRATION: SHX5430

## 2023-06-07 HISTORY — PX: EUS: SHX5427

## 2023-06-07 LAB — GLUCOSE, CAPILLARY: Glucose-Capillary: 153 mg/dL — ABNORMAL HIGH (ref 70–99)

## 2023-06-07 SURGERY — EGD (ESOPHAGOGASTRODUODENOSCOPY)
Anesthesia: Monitor Anesthesia Care

## 2023-06-07 MED ORDER — PROPOFOL 10 MG/ML IV BOLUS
INTRAVENOUS | Status: DC | PRN
  Administered 2023-06-07 (×2): 10 mg via INTRAVENOUS
  Administered 2023-06-07: 20 mg via INTRAVENOUS
  Administered 2023-06-07: 10 mg via INTRAVENOUS

## 2023-06-07 MED ORDER — SODIUM CHLORIDE 0.9 % IV SOLN: INTRAVENOUS | Status: DC

## 2023-06-07 MED ORDER — LACTATED RINGERS IV SOLN
INTRAVENOUS | Status: AC | PRN
  Administered 2023-06-07: 1000 mL via INTRAVENOUS

## 2023-06-07 MED ORDER — PROPOFOL 500 MG/50ML IV EMUL
INTRAVENOUS | Status: AC
  Filled 2023-06-07: qty 50

## 2023-06-07 MED ORDER — PROPOFOL 500 MG/50ML IV EMUL
INTRAVENOUS | Status: DC | PRN
  Administered 2023-06-07: 120 ug/kg/min via INTRAVENOUS

## 2023-06-07 MED ORDER — LIDOCAINE HCL 1 % IJ SOLN
INTRAMUSCULAR | Status: DC | PRN
  Administered 2023-06-07: 50 mg via INTRADERMAL

## 2023-06-07 MED ORDER — ESMOLOL HCL 100 MG/10ML IV SOLN
INTRAVENOUS | Status: DC | PRN
  Administered 2023-06-07: 20 mg via INTRAVENOUS

## 2023-06-07 NOTE — Anesthesia Postprocedure Evaluation (Signed)
Anesthesia Post Note  Patient: Havish Petties  Procedure(s) Performed: ESOPHAGOGASTRODUODENOSCOPY (EGD) UPPER ENDOSCOPIC ULTRASOUND (EUS) LINEAR FINE NEEDLE ASPIRATION (FNA) LINEAR     Patient location during evaluation: PACU Anesthesia Type: MAC Level of consciousness: awake and alert Pain management: pain level controlled Vital Signs Assessment: post-procedure vital signs reviewed and stable Respiratory status: spontaneous breathing, nonlabored ventilation and respiratory function stable Cardiovascular status: blood pressure returned to baseline and stable Postop Assessment: no apparent nausea or vomiting Anesthetic complications: no   No notable events documented.  Last Vitals:  Vitals:   06/07/23 1130 06/07/23 1140  BP: 132/79 133/85  Pulse: 67 63  Resp: 16 17  Temp:    SpO2: 100% 100%    Last Pain:  Vitals:   06/07/23 1140  TempSrc:   PainSc: 0-No pain                 Lannie Fields

## 2023-06-07 NOTE — Telephone Encounter (Signed)
Notified Patient's Spouse, Misty Stanley, of completion of  Patient's FMLA forms. Fax transmission confirmation received. Copy of forms mailed to Patient as requested. No other needs or concerns voiced at this time.

## 2023-06-07 NOTE — Transfer of Care (Signed)
Immediate Anesthesia Transfer of Care Note  Patient: Jerame Hedding  Procedure(s) Performed: ESOPHAGOGASTRODUODENOSCOPY (EGD) UPPER ENDOSCOPIC ULTRASOUND (EUS) LINEAR FINE NEEDLE ASPIRATION (FNA) LINEAR  Patient Location: PACU and Endoscopy Unit  Anesthesia Type:MAC  Level of Consciousness: awake, alert , oriented, and patient cooperative  Airway & Oxygen Therapy: Patient Spontanous Breathing and Patient connected to face mask oxygen  Post-op Assessment: Report given to RN and Post -op Vital signs reviewed and stable  Post vital signs: Reviewed and stable  Last Vitals:  Vitals Value Taken Time  BP 133/82 06/07/23 1120  Temp    Pulse 68 06/07/23 1123  Resp 15 06/07/23 1123  SpO2 100 % 06/07/23 1123  Vitals shown include unvalidated device data.  Last Pain:  Vitals:   06/07/23 0849  TempSrc: Oral  PainSc: 0-No pain         Complications: No notable events documented.

## 2023-06-07 NOTE — H&P (Signed)
David [name] HPI: This is a 57 year old male with a recent diagnosis of metastatic pancreatic cancer.  He is here today as the PET scan only showed mild highlighting of the tail of the pancreas lesion, which is presumed to be the primary source.  Because of the weak signaling from this area, Dr. Mosetta Putt requires confirmation of malignancy in the tail of the pancreas.  Past Medical History:  Diagnosis Date   DM (diabetes mellitus) (HCC)     Past Surgical History:  Procedure Laterality Date   FINGER AMPUTATION     IR IMAGING GUIDED PORT INSERTION  06/06/2023   LIVER BIOPSY  05/10/2023    Family History  Problem Relation Age of Onset   Cancer Maternal Uncle        lung cancer   Cancer Maternal Grandfather        lung cancer    Social History:  reports that he has never smoked. He has never used smokeless tobacco. He reports current alcohol use. He reports that he does not use drugs.  Allergies: No Known Allergies  Medications: Scheduled: Continuous:  sodium chloride     lactated ringers 1,000 mL (06/07/23 0856)    Results for orders placed or performed during the hospital encounter of 06/07/23 (from the past 24 hour(s))  Glucose, capillary     Status: Abnormal   Collection Time: 06/07/23  8:53 AM  Result Value Ref Range   Glucose-Capillary 153 (H) 70 - 99 mg/dL     IR IMAGING GUIDED PORT INSERTION  Result Date: 06/06/2023 INDICATION: Metastatic prostate cancer. In need of durable intravenous access for chemotherapy administration. EXAM: IMPLANTED PORT A CATH PLACEMENT WITH ULTRASOUND AND FLUOROSCOPIC GUIDANCE COMPARISON:  PET-CT-06/04/2023 MEDICATIONS: None ANESTHESIA/SEDATION: Moderate (conscious) sedation was employed during this procedure as administered by the Interventional Radiology RN. A total of Versed 2 mg and Fentanyl 100 mcg was administered intravenously. Moderate Sedation Time: 28 minutes. The patient's level of consciousness and vital signs were monitored  continuously by radiology nursing throughout the procedure under my direct supervision. CONTRAST:  None FLUOROSCOPY TIME:  30 seconds (1 mGy) COMPLICATIONS: None immediate. PROCEDURE: The procedure, risks, benefits, and alternatives were explained to the patient. Questions regarding the procedure were encouraged and answered. The patient understands and consents to the procedure. The right neck and chest were prepped with chlorhexidine in a sterile fashion, and a sterile drape was applied covering the operative field. Maximum barrier sterile technique with sterile gowns and gloves were used for the procedure. A timeout was performed prior to the initiation of the procedure. Local anesthesia was provided with 1% lidocaine with epinephrine. After creating a small venotomy incision, a micropuncture kit was utilized to access the internal jugular vein. Real-time ultrasound guidance was utilized for vascular access including the acquisition of a permanent ultrasound image documenting patency of the accessed vessel. The microwire was utilized to measure appropriate catheter length. A subcutaneous port pocket was then created along the upper chest wall utilizing a combination of sharp and blunt dissection. The pocket was irrigated with sterile saline. A single lumen Slim sized power injectable port was chosen for placement. The 8 Fr catheter was tunneled from the port pocket site to the venotomy incision. The port was placed in the pocket. The external catheter was trimmed to appropriate length. At the venotomy, an 8 Fr peel-away sheath was placed over a guidewire under fluoroscopic guidance. The catheter was then placed through the sheath and the sheath was removed. Final catheter positioning  was confirmed and documented with a fluoroscopic spot radiograph. The port was accessed with a Huber needle, aspirated and flushed with heparinized saline. The venotomy site was closed with an interrupted 4-0 Vicryl suture. The port  pocket incision was closed with interrupted 2-0 Vicryl suture. Dermabond and Steri-strips were applied to both incisions. Dressings were applied. The patient tolerated the procedure well without immediate post procedural complication. FINDINGS: After catheter placement, the tip lies within the superior cavoatrial junction. The catheter aspirates and flushes normally and is ready for immediate use. IMPRESSION: Successful placement of a right internal jugular approach power injectable Port-A-Cath. The catheter is ready for immediate use. Electronically Signed   By: Simonne Come M.D.   On: 06/06/2023 14:55    ROS:  As stated above in the HPI otherwise negative.  Blood pressure (!) 148/80, pulse 71, temperature 98 F (36.7 C), temperature source Oral, resp. rate 19, height 5\' 8"  (1.727 m), weight 72.2 kg, SpO2 100 %.    PE: Gen: NAD, Alert and Oriented HEENT:  [place]/AT, EOMI Neck: Supple, no LAD Lungs: CTA Bilaterally CV: RRR without M/G/R ABD: Soft, NTND, +BS Ext: No C/C/E  Assessment/Plan: 1) Pancreatic tail lesion - EUS with FNA.  David [name] D 06/07/2023, 9:12 AM

## 2023-06-07 NOTE — Op Note (Signed)
[facility] Patient Name: David [name] Procedure Date: 06/07/2023 MRN: [id number] Attending MD: Jeani Hawking , MD, [id number] Date of Birth: July 29, 1966 CSN: [id number] Age: 57 Admit Type: Outpatient Procedure:                Upper EUS Indications:              Suspected mass in pancreas on MRI Providers:                Jeani Hawking, MD, Zoe Lan, RN, Salley Scarlet, Technician, Marlena Clipper, CRNA Referring MD:              Medicines:                Propofol per Anesthesia Complications:            No immediate complications. Estimated Blood Loss:     Estimated blood loss: none. Procedure:                Pre-Anesthesia Assessment:                           - Prior to the procedure, a History and Physical                            was performed, and patient medications and                            allergies were reviewed. The patient's tolerance of                            previous anesthesia was also reviewed. The risks                            and benefits of the procedure and the sedation                            options and risks were discussed with the patient.                            All questions were answered, and informed consent                            was obtained. Prior Anticoagulants: The patient has                            taken no anticoagulant or antiplatelet agents. ASA                            Grade Assessment: III - A patient with severe                            systemic disease. After reviewing the risks and  benefits, the patient was deemed in satisfactory                            condition to undergo the procedure.                           - Sedation was administered by an anesthesia                            professional. Deep sedation was attained.                           After obtaining informed consent, the endoscope was                            passed  under direct vision. Throughout the                            procedure, the patient's blood pressure, pulse, and                            oxygen saturations were monitored continuously. The                            [id number] ([id number]) Olympus linear ultrasound scope                            was introduced through the mouth, and advanced to                            the second part of duodenum. The upper EUS was                            accomplished without difficulty. The patient                            tolerated the procedure well. Scope In: Scope Out: Findings:      ENDOSONOGRAPHIC FINDING: :      An irregular mass was identified in the pancreatic tail. The mass was       heterogenous. The mass measured 22 mm by 15 mm in maximal       cross-sectional diameter. The outer margins were irregular. The       remainder of the pancreas was examined. The endosonographic appearance       of parenchyma and the upstream pancreatic duct indicated that the       remainder of the pancreas was unremarkable. Fine needle aspiration for       cytology was performed. Color Doppler imaging was utilized prior to       needle puncture to confirm a lack of significant vascular structures       within the needle path. Six passes were made with the 22 gauge needle       using a transgastric approach. A stylet was used. A cytotechnologist was       present to evaluate the adequacy of the specimen. The cellularity of the  specimen was adequate. Final cytology results are pending.      In the tail of the pancreas, before the splenic hilum, an heterogenous       echogenic mass was identified. This lesion was difficult to identify at       first, but with closer observation is was clearer in view. At its       greatest dimension it was approximatley 22 mm x 15 mm. Six passes with       the 22 gauge FNA needle were performed with adequate sampling. The       lesion was firm with needle palpation.  In the subcarina an 11 mm LN was       identified, but no FNA was performed as the intent was to determine if       this was a primary adenocarcinoma of the pancreas. Impression:               - A mass was identified in the pancreatic tail.                            Fine needle aspiration performed. Moderate Sedation:      Not Applicable - Patient had care per Anesthesia. Recommendation:           - Patient has a contact number available for                            emergencies. The signs and symptoms of potential                            delayed complications were discussed with the                            patient. Return to normal activities tomorrow.                            Written discharge instructions were provided to the                            patient.                           - Resume previous diet.                           - Await cytology results. Procedure Code(s):        --- Professional ---                           [phone], Esophagogastroduodenoscopy, flexible,                            transoral; with transendoscopic ultrasound-guided                            intramural or transmural fine needle                            aspiration/biopsy(s), (includes endoscopic  ultrasound examination limited to the esophagus,                            stomach or duodenum, and adjacent structures) Diagnosis Code(s):        --- Professional ---                           K86.89, Other specified diseases of pancreas                           R93.3, Abnormal findings on diagnostic imaging of                            other parts of digestive tract CPT copyright 2022 [employer]. All rights reserved. The codes documented in this report are preliminary and upon coder review may  be revised to meet current compliance requirements. Jeani Hawking, MD Jeani Hawking, MD 06/07/2023 11:25:29 AM This report has been signed  electronically. Number of Addenda: 0

## 2023-06-07 NOTE — Discharge Instructions (Signed)

## 2023-06-07 NOTE — Progress Notes (Signed)
The patient was seen by Dr. Mosetta Putt and myself yesterday. The discussion yesterday was regarding treating as primary pancreatic malignancy with FOLFIRINOX vs treating with cisplatin, gemzar, and immunotherapy for cholangiocarcinoma.  Dr. Elnoria Howard from gastroenterology was able to perform a EUS today for the pancreatic lesion. He updated [place] that he had enough sample for the cell block, but the quick stain shows atypical and benign. It was a tough lesion to find and the lesion was hard.   Dr. Mosetta Putt and myself will be out of the office next week.  We expect the final pathology to take a few days to result.  Dr. Mosetta Putt recommends waiting until the final pathology to determine next steps.  If the final pathology returns as atypical, Dr. Mosetta Putt would still plan to treat as primary pancreatic cancer with FOLFIRINOX.  If the final pathology is back as benign, then she would recommend changing treatment to chemotherapy and immunotherapy for cholangiocarcinoma.  Clydie Braun, nurse navigator is also aware.  Will be monitoring for results next week.  The patient is scheduled to see Dr. Bertis Ruddy next week to review the final pathology and change the care plan if needed.  He is tentatively scheduled for his first cycle of chemo on 06/17/2023 with FOLFIRINOX.  If his treatment plan needs to be changed to cisplatin and Gemzar, we will likely need to delay treatment by another week to allow time for insurance authorizations and scheduling with the upcoming holiday.  I will call the patient later today to summarize this information.

## 2023-06-07 NOTE — Telephone Encounter (Signed)
Attempted to call the patient and his wife about the plan. Unable to reach either of them. Left a voicemail that I would try calling back today around 4 PM.

## 2023-06-07 NOTE — Anesthesia Preprocedure Evaluation (Addendum)
Anesthesia Evaluation  Patient identified by MRN, date of birth, ID band Patient awake    Reviewed: Allergy & Precautions, H&P , NPO status , Patient's Chart, lab work & pertinent test results  Airway Mallampati: I  TM Distance: >3 FB Neck ROM: Full    Dental  (+) Teeth Intact, Dental Advisory Given   Pulmonary neg pulmonary ROS   Pulmonary exam normal breath sounds clear to auscultation       Cardiovascular negative cardio ROS Normal cardiovascular exam Rhythm:Regular Rate:Normal     Neuro/Psych negative neurological ROS  negative psych ROS   GI/Hepatic Neg liver ROS,,,Pancreatic ca   Endo/Other  diabetes, Well Controlled, Type 2, Oral Hypoglycemic Agents  FS 153  Renal/GU negative Renal ROS  negative genitourinary   Musculoskeletal negative musculoskeletal ROS (+)    Abdominal   Peds negative pediatric ROS (+)  Hematology negative hematology ROS (+)   Anesthesia Other Findings Semaglutide tabs- stopped a few months ago  Reproductive/Obstetrics negative OB ROS                             Anesthesia Physical Anesthesia Plan  ASA: 2  Anesthesia Plan: MAC   Post-op Pain Management:    Induction:   PONV Risk Score and Plan: 2 and Propofol infusion and TIVA  Airway Management Planned: Natural Airway and Simple Face Mask  Additional Equipment: None  Intra-op Plan:   Post-operative Plan:   Informed Consent: I have reviewed the patients History and Physical, chart, labs and discussed the procedure including the risks, benefits and alternatives for the proposed anesthesia with the patient or authorized representative who has indicated his/her understanding and acceptance.       Plan Discussed with: CRNA  Anesthesia Plan Comments:        Anesthesia Quick Evaluation

## 2023-06-08 ENCOUNTER — Other Ambulatory Visit: Payer: Self-pay

## 2023-06-10 ENCOUNTER — Encounter (HOSPITAL_COMMUNITY): Payer: Self-pay | Admitting: Gastroenterology

## 2023-06-10 ENCOUNTER — Encounter (HOSPITAL_COMMUNITY): Admission: RE | Payer: Self-pay | Source: Home / Self Care

## 2023-06-10 ENCOUNTER — Ambulatory Visit (HOSPITAL_COMMUNITY)
Admission: RE | Admit: 2023-06-10 | Payer: PRIVATE HEALTH INSURANCE | Source: Home / Self Care | Admitting: Gastroenterology

## 2023-06-10 ENCOUNTER — Other Ambulatory Visit: Payer: Self-pay

## 2023-06-10 ENCOUNTER — Inpatient Hospital Stay: Payer: PRIVATE HEALTH INSURANCE

## 2023-06-10 ENCOUNTER — Encounter: Payer: Self-pay | Admitting: Hematology

## 2023-06-10 DIAGNOSIS — C787 Secondary malignant neoplasm of liver and intrahepatic bile duct: Secondary | ICD-10-CM

## 2023-06-10 SURGERY — UPPER ESOPHAGEAL ENDOSCOPIC ULTRASOUND (EUS)
Anesthesia: Monitor Anesthesia Care

## 2023-06-10 NOTE — Progress Notes (Signed)
Pharmacist Chemotherapy Monitoring - Initial Assessment    Anticipated start date: 06/17/23   The following has been reviewed per standard work regarding the patient's treatment regimen: The patient's diagnosis, treatment plan and drug doses, and organ/hematologic function Lab orders and baseline tests specific to treatment regimen  The treatment plan start date, drug sequencing, and pre-medications Prior authorization status  Patient's documented medication list, including drug-drug interaction screen and prescriptions for anti-emetics and supportive care specific to the treatment regimen The drug concentrations, fluid compatibility, administration routes, and timing of the medications to be used The patient's access for treatment and lifetime cumulative dose history, if applicable  The patient's medication allergies and previous infusion related reactions, if applicable   Changes made to treatment plan:  N/A  Follow up needed:  N/A   Demetrius Charity, RPH, 06/10/2023  3:26 PM

## 2023-06-11 ENCOUNTER — Telehealth: Payer: Self-pay | Admitting: Physician Assistant

## 2023-06-11 ENCOUNTER — Ambulatory Visit: Payer: PRIVATE HEALTH INSURANCE

## 2023-06-11 ENCOUNTER — Other Ambulatory Visit: Payer: PRIVATE HEALTH INSURANCE

## 2023-06-11 LAB — CYTOLOGY - NON PAP

## 2023-06-11 NOTE — Telephone Encounter (Signed)
I called the patient to review the results of his EUS of the pancreatic lesion which are reported as suspicious for malignancy.  At his appointment last week, the conversation centered around whether his malignancy is cholangiocarcinoma versus pancreatic.  Dr. Mosetta Putt recommended EUS of the pancreatic lesion.  If this were benign, Dr. Mosetta Putt favored changing treatment to first-line chemotherapy for cholangiocarcinoma with cisplatin, Gemzar, and Durvalumab.  However, if results come back atypical, suspicious of malignancy, or confirmatory for pancreatic, then she would favor continuing treatment as planned for primary site of pancreatic cancer.  I reviewed this with the patient.  He expressed concern with moving forward with treatment on 06/17/2023 if there is a "chance" that this could be cholangiocarcinoma.  He also was wondering if it is possible for cholangiocarcinoma to metastasized to the pancreas.  I answered his questions to the best of my ability but the patient does want to discuss further prior to initiating treatment.  I have sent a message to [facility] navigator, Dr. Mosetta Putt, and Dr. Bertis Ruddy with his concerns.  The patient is scheduled with Dr. Bertis Ruddy on Friday prior to his chemo appointment on Monday, 06/17/2023.  He can discuss this in more depth at that time.  Additionally I also offered to cancel his chemo and appointment with Dr. Bertis Ruddy and reschedule a follow-up upon Dr. Latanya Maudlin return to the office.  We will keep everything as scheduled for now and if after seeing Dr. Bertis Ruddy, the patient would like to discuss further with Dr. Mosetta Putt, Dr. Bertis Ruddy can always make these arrangements.

## 2023-06-13 ENCOUNTER — Other Ambulatory Visit: Payer: Self-pay | Admitting: *Deleted

## 2023-06-13 DIAGNOSIS — C259 Malignant neoplasm of pancreas, unspecified: Secondary | ICD-10-CM

## 2023-06-14 ENCOUNTER — Inpatient Hospital Stay: Payer: PRIVATE HEALTH INSURANCE

## 2023-06-14 ENCOUNTER — Inpatient Hospital Stay (HOSPITAL_BASED_OUTPATIENT_CLINIC_OR_DEPARTMENT_OTHER): Payer: PRIVATE HEALTH INSURANCE | Admitting: Oncology

## 2023-06-14 ENCOUNTER — Inpatient Hospital Stay: Payer: PRIVATE HEALTH INSURANCE | Admitting: Hematology and Oncology

## 2023-06-14 ENCOUNTER — Telehealth: Payer: Self-pay | Admitting: Nurse Practitioner

## 2023-06-14 ENCOUNTER — Other Ambulatory Visit: Payer: BC Managed Care – PPO

## 2023-06-14 VITALS — BP 138/89 | HR 69 | Temp 98.1°F | Resp 18 | Ht 68.0 in | Wt 161.3 lb

## 2023-06-14 DIAGNOSIS — C787 Secondary malignant neoplasm of liver and intrahepatic bile duct: Secondary | ICD-10-CM

## 2023-06-14 DIAGNOSIS — C252 Malignant neoplasm of tail of pancreas: Secondary | ICD-10-CM

## 2023-06-14 DIAGNOSIS — C259 Malignant neoplasm of pancreas, unspecified: Secondary | ICD-10-CM

## 2023-06-14 LAB — CBC WITH DIFFERENTIAL (CANCER CENTER ONLY)
Abs Immature Granulocytes: 0.04 10*3/uL (ref 0.00–0.07)
Basophils Absolute: 0 10*3/uL (ref 0.0–0.1)
Basophils Relative: 0 %
Eosinophils Absolute: 0.1 10*3/uL (ref 0.0–0.5)
Eosinophils Relative: 1 %
HCT: 40 % (ref 39.0–52.0)
Hemoglobin: 13.5 g/dL (ref 13.0–17.0)
Immature Granulocytes: 1 %
Lymphocytes Relative: 16 %
Lymphs Abs: 1.4 10*3/uL (ref 0.7–4.0)
MCH: 29.3 pg (ref 26.0–34.0)
MCHC: 33.8 g/dL (ref 30.0–36.0)
MCV: 87 fL (ref 80.0–100.0)
Monocytes Absolute: 0.8 10*3/uL (ref 0.1–1.0)
Monocytes Relative: 10 %
Neutro Abs: 6.4 10*3/uL (ref 1.7–7.7)
Neutrophils Relative %: 72 %
Platelet Count: 287 10*3/uL (ref 150–400)
RBC: 4.6 MIL/uL (ref 4.22–5.81)
RDW: 12.1 % (ref 11.5–15.5)
WBC Count: 8.8 10*3/uL (ref 4.0–10.5)
nRBC: 0 % (ref 0.0–0.2)

## 2023-06-14 LAB — CMP (CANCER CENTER ONLY)
ALT: 12 U/L (ref 0–44)
AST: 16 U/L (ref 15–41)
Albumin: 4.3 g/dL (ref 3.5–5.0)
Alkaline Phosphatase: 109 U/L (ref 38–126)
Anion gap: 8 (ref 5–15)
BUN: 17 mg/dL (ref 6–20)
CO2: 26 mmol/L (ref 22–32)
Calcium: 9.3 mg/dL (ref 8.9–10.3)
Chloride: 100 mmol/L (ref 98–111)
Creatinine: 0.88 mg/dL (ref 0.61–1.24)
GFR, Estimated: >60 mL/min (ref >60)
Glucose, Bld: 215 mg/dL — ABNORMAL HIGH (ref 70–99)
Potassium: 4.1 mmol/L (ref 3.5–5.1)
Sodium: 134 mmol/L — ABNORMAL LOW (ref 135–145)
Total Bilirubin: 0.5 mg/dL (ref 0.3–1.2)
Total Protein: 7.5 g/dL (ref 6.5–8.1)

## 2023-06-14 MED ORDER — HEPARIN SOD (PORK) LOCK FLUSH 100 UNIT/ML IV SOLN
500.0000 [IU] | Freq: Once | INTRAVENOUS | Status: AC
  Administered 2023-06-14: 500 [IU] via INTRAVENOUS

## 2023-06-14 MED ORDER — SODIUM CHLORIDE 0.9% FLUSH
10.0000 mL | Freq: Once | INTRAVENOUS | Status: AC
  Administered 2023-06-14: 10 mL via INTRAVENOUS

## 2023-06-14 NOTE — Progress Notes (Signed)
[facility] New Patient Consult   Requesting MD: Samuella Bruin [address],  [place] [address]   David [name] 57 y.o.  07/02/1966    Reason for Consult: Pancreas cancer    HPI: David [name] reports feeling completely until 04/24/2023 when he noted the acute onset of right subcostal pain.  The pain was worse with inspiration.  He had an episode of "heartburn" the previous night.  The pain lasted for a few days and resolved.  He had similar pain for 2 to 3 days beginning 05/15/2023.  No pain at present.  He presented to the emergency room 04/24/2023.  A CT abdomen and pelvis revealed multiple liver lesions.  The pancreas was unremarkable.  Prominent upper abdominal lymph nodes in the portacaval space.  A contrast CT of the chest 04/24/2023 revealed no pulmonary embolism.  No suspicious pulmonary nodule or mass.  Multiple liver lesions and borderline lymphadenopathy in the hepatoduodenal ligament.  He was referred Dr. Mosetta Putt.  An MRI of the liver on 05/05/2023 failed multiple rim-enhancing liver lesions the largest measuring 8 x 7.3 cm in the liver dome with multiple satellite lesions.  There is suspicion for a small mass in the pancreas tail.  No enlarged abdominal lymph nodes.  The findings were felt to be most consistent with hepatic metastatic disease.  He underwent an ultrasound-guided biopsy of the dominant right liver lesion on 05/10/2023.  The pathology revealed adenocarcinoma.  Mismatch repair protein expression is intact.  The tumor cells are positive for CK7 and CDX2 with rare positive cells for CK20.  The differential diagnose includes a pancreaticobiliary carcinoma.  Lymphovascular invasion was identified.  A PET scan 06/04/2023 will multiple hypermetabolic liver lesions.  Low-level FDG uptake is identified within the soft tissue fullness at the tip of the pancreas tail.  A small soft tissue nodule between the gastric fundus and spleen has an SUV of 2.9.  No  hypermetabolic lymphadenopathy.  A small focus of activity is identified at the left acetabulum without a CT correlate. He was taken to an EUS by Dr. Earlyne Iba on 06/07/2023.  A 22 x 15 mm mass was identified in the pancreas tail with irregular outer margins.  Manage of the pancreas was unremarkable.  A fine-needle aspiration biopsy was performed.  The cytology returned "suspicious "for malignancy.  Mr [name] has discussed treatment recommendations with Dr. Mosetta Putt.  She recommends proceeding with FOLFIRINOX for treatment of the probable metastatic pancreas cancer.  He saw Dr. Berton Mount at [facility] on 06/03/2023.  He feels the tumor is likely from a pancreas primary and recommends FOLFIRINOX.  David [name] is here today with his wife and son.  He reports no pain at present.   Past Medical History:  Diagnosis Date   DM (diabetes mellitus) (HCC)     Past Surgical History:  Procedure Laterality Date   ESOPHAGOGASTRODUODENOSCOPY N/A 06/07/2023   Procedure: ESOPHAGOGASTRODUODENOSCOPY (EGD);  Surgeon: Jeani Hawking, MD;  Location: [facility];  Service: Gastroenterology;  Laterality: N/A;   EUS N/A 06/07/2023   Procedure: UPPER ENDOSCOPIC ULTRASOUND (EUS) LINEAR;  Surgeon: Jeani Hawking, MD;  Location: [facility];  Service: Gastroenterology;  Laterality: N/A;   FINE NEEDLE ASPIRATION N/A 06/07/2023   Procedure: FINE NEEDLE ASPIRATION (FNA) LINEAR;  Surgeon: Jeani Hawking, MD;  Location: [facility];  Service: Gastroenterology;  Laterality: N/A;   FINGER AMPUTATION     IR IMAGING GUIDED PORT INSERTION  06/06/2023   LIVER BIOPSY  05/10/2023    Medications:  Reviewed  Allergies: No Known Allergies  Family history: A maternal aunt died of pancreas cancer.  His maternal grandfather and 2 maternal uncles had lung cancer and were smokers.  Social History:   He is a Emergency planning/management officer in [place].  He lives with his wife.  He previously worked for the [employer].  He does not use cigarettes.  He drinks beer  occasionally.  No risk factor for HIV or hepatitis.  ROS:   Positives include: 3 episodes of night sweats during the week of 05/19/2023, right subcostal pain 04/24/2023 and again on 05/15/2023  A complete ROS was otherwise negative.  Physical Exam:  Blood pressure 138/89, pulse 69, temperature 98.1 F (36.7 C), temperature source Oral, resp. rate 18, height 5\' 8"  (1.727 m), weight 161 lb 4.8 oz (73.2 kg), SpO2 100 %.  HEENT: Oropharynx without visible mass, neck without mass Lungs: There are bilaterally Cardiac: Regular rate and rhythm Abdomen: No hepatosplenomegaly, nontender, no mass  Vascular: No leg edema Lymph nodes: No cervical, supraclavicular, axillary, or inguinal nodes Neurologic: Alert and oriented, the motor exam appears intact in the upper and lower extremities bilaterally Skin: No rash Musculoskeletal: No spine tenderness   LAB:  CBC  Lab Results  Component Value Date   WBC 8.8 06/14/2023   HGB 13.5 06/14/2023   HCT 40.0 06/14/2023   MCV 87.0 06/14/2023   PLT 287 06/14/2023   NEUTROABS 6.4 06/14/2023        CMP  Lab Results  Component Value Date   NA 134 (L) 06/14/2023   K 4.1 06/14/2023   CL 100 06/14/2023   CO2 26 06/14/2023   GLUCOSE 215 (H) 06/14/2023   BUN 17 06/14/2023   CREATININE 0.88 06/14/2023   CALCIUM 9.3 06/14/2023   PROT 7.5 06/14/2023   ALBUMIN 4.3 06/14/2023   AST 16 06/14/2023   ALT 12 06/14/2023   ALKPHOS 109 06/14/2023   BILITOT 0.5 06/14/2023   GFRNONAA >60 06/14/2023   GFRAA >60 03/26/2018     Lab Results  Component Value Date   ZOX096 495 (H) 05/02/2023    Imaging: CT, MRI, and pet images reviewed with David [name] and his family   Assessment/Plan:   Pancreas cancer CT angiogram chest 04/24/2023-low-density mass in the dome of the liver with at least 3 additional lesions in both hepatic lobes, borderline lymphadenopathy in the hepatoduodenal ligament Right upper quadrant ultrasound 04/24/2023-large circumscribed mass  of the liver dome CT abdomen/pelvis 04/24/2023-multiple liver lesions poorly defined on noncontrast exam, prominent portacaval and porta hepatis nodes MRI abdomen 05/05/2023-multiple rim-hypoenhancing liver lesions consistent with hepatic metastases, suspicion for a pancreas tail mass Ultrasound-guided biopsy of dominant right liver lesion 05/10/2023-adenocarcinoma, CK7 and CDX2 positive, abundant cytoplasm and mucin with extracellular mucin, foci of lymphovascular invasion Tempus gene panel-K-ras G12V, T P53 mild tumor mutation burden 3.2, MSS PET 06/04/2023-multiple hypermetabolic liver lesions consistent with metastases, low-level FDG uptake in the soft tissue fullness at the tip of the pancreas tail, low-level FDG activity involving a small soft tissue nodule between the gastric fundus and spleen, tiny foci of accumulation at the right costovertebral junction at T10 and roof of the left acetabulum without an underlying CT lesion Evaded CEA and CA 19-9 EUS six 2124-22 x 15 mm irregular mass in the tail the pancreas, 11 mm subcarinal lymph node, FNA biopsy of the pancreas mass-suspicious for malignancy Family history of pancreas cancer INVITAE panel 05/17/2023-NF1 VUS   Disposition:   David [name] has been diagnosed with adenocarcinoma involving  biopsy of a liver lesion.  The clinical presentation is most consistent with a diagnosis of metastatic pancreas cancer.  He has been seen in consultation by Drs. Feng and Costen.  FOLFIRINOX chemotherapy is recommended.  I reviewed the CT, PET, and MRI images with Mr. Borchard.  He understands the most likely diagnosis is metastatic pancreas cancer, but cholangiocarcinoma remains in the differential diagnosis.  I explained there is no definitive test to distinguish between pancreas cancer and bile duct cancer.  We discussed the possibility of submitting tissue for a gene array unknown primary assay, but I suspect it would be difficult to distinguish between pancreas  and bile duct cancer.  Mr. Hitt agrees to proceed with FOLFIRINOX.  We reviewed potential toxicities associated with the FOLFIRINOX regimen including the chance of nausea/vomiting, mucositis, diarrhea, alopecia, hematologic toxicity, infection, and bleeding.  We discussed the cardiac toxicity, hyperpigmentation, rash, sun sensitivity, and hand/foot syndrome associated with 5-fluorouracil.  We discussed the diarrhea and nausea/vomiting seen with irinotecan.  We reviewed the allergic reaction and various types of neuropathy seen with oxaliplatin.   He has attended a chemotherapy teaching class.  We discussed the potential need for G-CSF support.  We reviewed potential toxicities associated with G-CSF including the chance of bone pain, rash, and splenic rupture.  He agrees to proceed.  Mr. Kirt will return for cycle 1 FOLFIRINOX on 06/19/2023.  He will be scheduled for an office visit prior to cycle 2 on 07/03/2023.  Thornton Papas, MD  06/14/2023, 4:24 PM

## 2023-06-14 NOTE — Telephone Encounter (Signed)
Rescheduled appointments per 6/28 secure chat. Left voicemail for patient.

## 2023-06-14 NOTE — Patient Instructions (Signed)

## 2023-06-15 ENCOUNTER — Other Ambulatory Visit: Payer: Self-pay

## 2023-06-15 LAB — CANCER ANTIGEN 19-9: CA 19-9: 762 U/mL — ABNORMAL HIGH (ref 0–35)

## 2023-06-17 ENCOUNTER — Encounter: Payer: Self-pay | Admitting: Oncology

## 2023-06-17 ENCOUNTER — Ambulatory Visit: Payer: PRIVATE HEALTH INSURANCE

## 2023-06-19 ENCOUNTER — Encounter: Payer: Self-pay | Admitting: Hematology

## 2023-06-19 ENCOUNTER — Inpatient Hospital Stay: Payer: 59 | Attending: Hematology

## 2023-06-19 ENCOUNTER — Other Ambulatory Visit: Payer: Self-pay

## 2023-06-19 VITALS — BP 150/83 | HR 90 | Temp 98.2°F | Resp 18

## 2023-06-19 DIAGNOSIS — Z5189 Encounter for other specified aftercare: Secondary | ICD-10-CM | POA: Diagnosis not present

## 2023-06-19 DIAGNOSIS — Z800 Family history of malignant neoplasm of digestive organs: Secondary | ICD-10-CM | POA: Insufficient documentation

## 2023-06-19 DIAGNOSIS — Z5111 Encounter for antineoplastic chemotherapy: Secondary | ICD-10-CM | POA: Insufficient documentation

## 2023-06-19 DIAGNOSIS — C252 Malignant neoplasm of tail of pancreas: Secondary | ICD-10-CM | POA: Insufficient documentation

## 2023-06-19 DIAGNOSIS — Z801 Family history of malignant neoplasm of trachea, bronchus and lung: Secondary | ICD-10-CM | POA: Diagnosis not present

## 2023-06-19 DIAGNOSIS — C787 Secondary malignant neoplasm of liver and intrahepatic bile duct: Secondary | ICD-10-CM | POA: Diagnosis present

## 2023-06-19 MED ORDER — SODIUM CHLORIDE 0.9 % IV SOLN
2400.0000 mg/m2 | INTRAVENOUS | Status: DC
  Administered 2023-06-19: 5000 mg via INTRAVENOUS
  Filled 2023-06-19: qty 100

## 2023-06-19 MED ORDER — DEXTROSE 5 % IV SOLN: Freq: Once | INTRAVENOUS | Status: AC

## 2023-06-19 MED ORDER — SODIUM CHLORIDE 0.9 % IV SOLN
150.0000 mg/m2 | Freq: Once | INTRAVENOUS | Status: AC
  Administered 2023-06-19: 300 mg via INTRAVENOUS
  Filled 2023-06-19: qty 15

## 2023-06-19 MED ORDER — SODIUM CHLORIDE 0.9 % IV SOLN
400.0000 mg/m2 | Freq: Once | INTRAVENOUS | Status: AC
  Administered 2023-06-19: 752 mg via INTRAVENOUS
  Filled 2023-06-19: qty 37.6

## 2023-06-19 MED ORDER — SODIUM CHLORIDE 0.9% FLUSH
10.0000 mL | INTRAVENOUS | Status: DC | PRN
  Administered 2023-06-19: 10 mL

## 2023-06-19 MED ORDER — PALONOSETRON HCL INJECTION 0.25 MG/5ML
0.2500 mg | Freq: Once | INTRAVENOUS | Status: AC
  Administered 2023-06-19: 0.25 mg via INTRAVENOUS
  Filled 2023-06-19: qty 5

## 2023-06-19 MED ORDER — ATROPINE SULFATE 1 MG/ML IV SOLN
0.5000 mg | Freq: Once | INTRAVENOUS | Status: AC | PRN
  Administered 2023-06-19: 0.5 mg via INTRAVENOUS
  Filled 2023-06-19: qty 1

## 2023-06-19 MED ORDER — SODIUM CHLORIDE 0.9 % IV SOLN
150.0000 mg | Freq: Once | INTRAVENOUS | Status: AC
  Administered 2023-06-19: 150 mg via INTRAVENOUS
  Filled 2023-06-19: qty 150

## 2023-06-19 MED ORDER — SODIUM CHLORIDE 0.9 % IV SOLN
10.0000 mg | Freq: Once | INTRAVENOUS | Status: AC
  Administered 2023-06-19: 10 mg via INTRAVENOUS
  Filled 2023-06-19: qty 10

## 2023-06-19 MED ORDER — OXALIPLATIN CHEMO INJECTION 100 MG/20ML
85.0000 mg/m2 | Freq: Once | INTRAVENOUS | Status: AC
  Administered 2023-06-19: 150 mg via INTRAVENOUS
  Filled 2023-06-19: qty 20

## 2023-06-19 NOTE — Progress Notes (Signed)
Patient with complaints of feeling hot and sweaty.  Irinotecan paused, line flushed with normal saline, and atropine administered (see MAR).  Irinotecan restarted. Patient able to complete remainder of infusion without any further issues.

## 2023-06-19 NOTE — Patient Instructions (Signed)
[facility] [facility]  Discharge Instructions: Thank you for choosing [facility] to provide your oncology and hematology care.   If you have a lab appointment with the Cancer Center, please go directly to the Cancer Center and check in at the registration area.   Wear comfortable clothing and clothing appropriate for easy access to any Portacath or PICC line.   We strive to give you quality time with your provider. You may need to reschedule your appointment if you arrive late (15 or more minutes).  Arriving late affects you and other patients whose appointments are after yours.  Also, if you miss three or more appointments without notifying the office, you may be dismissed from the clinic at the provider's discretion.      For prescription refill requests, have your pharmacy contact our office and allow 72 hours for refills to be completed.    Today you received the following chemotherapy and/or immunotherapy agents: oxaliplatin, irinotecan, leucovorin, fluorouracil.       To help prevent nausea and vomiting after your treatment, we encourage you to take your nausea medication as directed.  BELOW ARE SYMPTOMS THAT SHOULD BE REPORTED IMMEDIATELY: *FEVER GREATER THAN 100.4 F (38 C) OR HIGHER *CHILLS OR SWEATING *NAUSEA AND VOMITING THAT IS NOT CONTROLLED WITH YOUR NAUSEA MEDICATION *UNUSUAL SHORTNESS OF BREATH *UNUSUAL BRUISING OR BLEEDING *URINARY PROBLEMS (pain or burning when urinating, or frequent urination) *BOWEL PROBLEMS (unusual diarrhea, constipation, pain near the anus) TENDERNESS IN MOUTH AND THROAT WITH OR WITHOUT PRESENCE OF ULCERS (sore throat, sores in mouth, or a toothache) UNUSUAL RASH, SWELLING OR PAIN  UNUSUAL VAGINAL DISCHARGE OR ITCHING   Items with * indicate a potential emergency and should be followed up as soon as possible or go to the Emergency Department if any problems should occur.  Please show the CHEMOTHERAPY ALERT  CARD or IMMUNOTHERAPY ALERT CARD at check-in to the Emergency Department and triage nurse.  Should you have questions after your visit or need to cancel or reschedule your appointment, please contact [place] [facility]  Dept: [phone]  and follow the prompts.  Office hours are 8:00 a.m. to 4:30 p.m. Monday - Friday. Please note that voicemails left after 4:00 p.m. may not be returned until the following business day.  We are closed weekends and major holidays. You have access to a nurse at all times for urgent questions. Please call the main number to the clinic Dept: [phone] and follow the prompts.   For any non-urgent questions, you may also contact your provider using MyChart. We now offer e-Visits for anyone 58 and older to request care online for non-urgent symptoms. For details visit [email or web address].   Also download the MyChart app! Go to the app store, search "MyChart", open the app, select [place], and log in with your MyChart username and password.  Oxaliplatin Injection What is this medication? OXALIPLATIN (ox AL i PLA tin) treats colorectal cancer. It works by slowing down the growth of cancer cells. This medicine may be used for other purposes; ask your health care provider or pharmacist if you have questions. COMMON BRAND NAME(S): Eloxatin What should I tell my care team before I take this medication? They need to know if you have any of these conditions: Heart disease History of irregular heartbeat or rhythm Liver disease Low blood cell levels (white cells, red cells, and platelets) Lung or breathing disease, such as asthma Take medications that treat or prevent  blood clots Tingling of the fingers, toes, or other nerve disorder An unusual or allergic reaction to oxaliplatin, other medications, foods, dyes, or preservatives If you or your partner are pregnant or trying to get pregnant Breast-feeding How should I use this  medication? This medication is injected into a vein. It is given by your care team in a hospital or clinic setting. Talk to your care team about the use of this medication in children. Special care may be needed. Overdosage: If you think you have taken too much of this medicine contact a poison control center or emergency room at once. NOTE: This medicine is only for you. Do not share this medicine with others. What if I miss a dose? Keep appointments for follow-up doses. It is important not to miss a dose. Call your care team if you are unable to keep an appointment. What may interact with this medication? Do not take this medication with any of the following: Cisapride Dronedarone Pimozide Thioridazine This medication may also interact with the following: Aspirin and aspirin-like medications Certain medications that treat or prevent blood clots, such as warfarin, apixaban, dabigatran, and rivaroxaban Cisplatin Cyclosporine Diuretics Medications for infection, such as acyclovir, adefovir, amphotericin B, bacitracin, cidofovir, foscarnet, ganciclovir, gentamicin, pentamidine, vancomycin NSAIDs, medications for pain and inflammation, such as ibuprofen or naproxen Other medications that cause heart rhythm changes Pamidronate Zoledronic acid This list may not describe all possible interactions. Give your health care provider a list of all the medicines, herbs, non-prescription drugs, or dietary supplements you use. Also tell them if you smoke, drink alcohol, or use illegal drugs. Some items may interact with your medicine. What should I watch for while using this medication? Your condition will be monitored carefully while you are receiving this medication. You may need blood work while taking this medication. This medication may make you feel generally unwell. This is not uncommon as chemotherapy can affect healthy cells as well as cancer cells. Report any side effects. Continue your course  of treatment even though you feel ill unless your care team tells you to stop. This medication may increase your risk of getting an infection. Call your care team for advice if you get a fever, chills, sore throat, or other symptoms of a cold or flu. Do not treat yourself. Try to avoid being around people who are sick. Avoid taking medications that contain aspirin, acetaminophen, ibuprofen, naproxen, or ketoprofen unless instructed by your care team. These medications may hide a fever. Be careful brushing or flossing your teeth or using a toothpick because you may get an infection or bleed more easily. If you have any dental work done, tell your dentist you are receiving this medication. This medication can make you more sensitive to cold. Do not drink cold drinks or use ice. Cover exposed skin before coming in contact with cold temperatures or cold objects. When out in cold weather wear warm clothing and cover your mouth and nose to warm the air that goes into your lungs. Tell your care team if you get sensitive to the cold. Talk to your care team if you or your partner are pregnant or think either of you might be pregnant. This medication can cause serious birth defects if taken during pregnancy and for 9 months after the last dose. A negative pregnancy test is required before starting this medication. A reliable form of contraception is recommended while taking this medication and for 9 months after the last dose. Talk to your care team about   effective forms of contraception. Do not father a child while taking this medication and for 6 months after the last dose. Use a condom while having sex during this time period. Do not breastfeed while taking this medication and for 3 months after the last dose. This medication may cause infertility. Talk to your care team if you are concerned about your fertility. What side effects may I notice from receiving this medication? Side effects that you should report to  your care team as soon as possible: Allergic reactions--skin rash, itching, hives, swelling of the face, lips, tongue, or throat Bleeding--bloody or black, tar-like stools, vomiting blood or brown material that looks like coffee grounds, red or dark brown urine, small red or purple spots on skin, unusual bruising or bleeding Dry cough, shortness of breath or trouble breathing Heart rhythm changes--fast or irregular heartbeat, dizziness, feeling faint or lightheaded, chest pain, trouble breathing Infection--fever, chills, cough, sore throat, wounds that don't heal, pain or trouble when passing urine, general feeling of discomfort or being unwell Liver injury--right upper belly pain, loss of appetite, nausea, light-colored stool, dark yellow or brown urine, yellowing skin or eyes, unusual weakness or fatigue Low red blood cell level--unusual weakness or fatigue, dizziness, headache, trouble breathing Muscle injury--unusual weakness or fatigue, muscle pain, dark yellow or brown urine, decrease in amount of urine Pain, tingling, or numbness in the hands or feet Sudden and severe headache, confusion, change in vision, seizures, which may be signs of posterior reversible encephalopathy syndrome (PRES) Unusual bruising or bleeding Side effects that usually do not require medical attention (report to your care team if they continue or are bothersome): Diarrhea Nausea Pain, redness, or swelling with sores inside the mouth or throat Unusual weakness or fatigue Vomiting This list may not describe all possible side effects. Call your doctor for medical advice about side effects. You may report side effects to FDA at 1-800-FDA-1088. Where should I keep my medication? This medication is given in a hospital or clinic. It will not be stored at home. NOTE: This sheet is a summary. It may not cover all possible information. If you have questions about this medicine, talk to your doctor, pharmacist, or health care  provider.  2024 Elsevier/Gold Standard (2023-02-10 00:00:00) Irinotecan Injection What is this medication? IRINOTECAN (ir in oh TEE kan) treats some types of cancer. It works by slowing down the growth of cancer cells. This medicine may be used for other purposes; ask your health care provider or pharmacist if you have questions. COMMON BRAND NAME(S): Camptosar What should I tell my care team before I take this medication? They need to know if you have any of these conditions: Dehydration Diarrhea Infection, especially a viral infection, such as chickenpox, cold sores, herpes Liver disease Low blood cell levels (white cells, red cells, and platelets) Low levels of electrolytes, such as calcium, magnesium, or potassium in your blood Recent or ongoing radiation An unusual or allergic reaction to irinotecan, other medications, foods, dyes, or preservatives If you or your partner are pregnant or trying to get pregnant Breast-feeding How should I use this medication? This medication is injected into a vein. It is given by your care team in a hospital or clinic setting. Talk to your care team about the use of this medication in children. Special care may be needed. Overdosage: If you think you have taken too much of this medicine contact a poison control center or emergency room at once. NOTE: This medicine is only for you. Do  not share this medicine with others. What if I miss a dose? Keep appointments for follow-up doses. It is important not to miss your dose. Call your care team if you are unable to keep an appointment. What may interact with this medication? Do not take this medication with any of the following: Cobicistat Itraconazole This medication may also interact with the following: Certain antibiotics, such as clarithromycin, rifampin, rifabutin Certain antivirals for HIV or AIDS Certain medications for fungal infections, such as ketoconazole, posaconazole,  voriconazole Certain medications for seizures, such as carbamazepine, phenobarbital, phenytoin Gemfibrozil Nefazodone St. John's wort This list may not describe all possible interactions. Give your health care provider a list of all the medicines, herbs, non-prescription drugs, or dietary supplements you use. Also tell them if you smoke, drink alcohol, or use illegal drugs. Some items may interact with your medicine. What should I watch for while using this medication? Your condition will be monitored carefully while you are receiving this medication. You may need blood work while taking this medication. This medication may make you feel generally unwell. This is not uncommon as chemotherapy can affect healthy cells as well as cancer cells. Report any side effects. Continue your course of treatment even though you feel ill unless your care team tells you to stop. This medication can cause serious side effects. To reduce the risk, your care team may give you other medications to take before receiving this one. Be sure to follow the directions from your care team. This medication may affect your coordination, reaction time, or judgement. Do not drive or operate machinery until you know how this medication affects you. Sit up or stand slowly to reduce the risk of dizzy or fainting spells. Drinking alcohol with this medication can increase the risk of these side effects. This medication may increase your risk of getting an infection. Call your care team for advice if you get a fever, chills, sore throat, or other symptoms of a cold or flu. Do not treat yourself. Try to avoid being around people who are sick. Avoid taking medications that contain aspirin, acetaminophen, ibuprofen, naproxen, or ketoprofen unless instructed by your care team. These medications may hide a fever. This medication may increase your risk to bruise or bleed. Call your care team if you notice any unusual bleeding. Be careful  brushing or flossing your teeth or using a toothpick because you may get an infection or bleed more easily. If you have any dental work done, tell your dentist you are receiving this medication. Talk to your care team if you or your partner are pregnant or think either of you might be pregnant. This medication can cause serious birth defects if taken during pregnancy and for 6 months after the last dose. You will need a negative pregnancy test before starting this medication. Contraception is recommended while taking this medication and for 6 months after the last dose. Your care team can help you find the option that works for you. Do not father a child while taking this medication and for 3 months after the last dose. Use a condom for contraception during this time period. Do not breastfeed while taking this medication and for 7 days after the last dose. This medication may cause infertility. Talk to your care team if you are concerned about your fertility. What side effects may I notice from receiving this medication? Side effects that you should report to your care team as soon as possible: Allergic reactions--skin rash, itching, hives, swelling of  the face, lips, tongue, or throat Dry cough, shortness of breath or trouble breathing Increased saliva or tears, increased sweating, stomach cramping, diarrhea, small pupils, unusual weakness or fatigue, slow heartbeat Infection--fever, chills, cough, sore throat, wounds that don't heal, pain or trouble when passing urine, general feeling of discomfort or being unwell Kidney injury--decrease in the amount of urine, swelling of the ankles, hands, or feet Low red blood cell level--unusual weakness or fatigue, dizziness, headache, trouble breathing Severe or prolonged diarrhea Unusual bruising or bleeding Side effects that usually do not require medical attention (report to your care team if they continue or are bothersome): Constipation Diarrhea Hair  loss Loss of appetite Nausea Stomach pain This list may not describe all possible side effects. Call your doctor for medical advice about side effects. You may report side effects to FDA at 1-800-FDA-1088. Where should I keep my medication? This medication is given in a hospital or clinic. It will not be stored at home. NOTE: This sheet is a summary. It may not cover all possible information. If you have questions about this medicine, talk to your doctor, pharmacist, or health care provider.  2024 Elsevier/Gold Standard (2022-04-16 00:00:00) Leucovorin Injection What is this medication? LEUCOVORIN (loo koe VOR in) prevents side effects from certain medications, such as methotrexate. It works by increasing folate levels. This helps protect healthy cells in your body. It may also be used to treat anemia caused by low levels of folate. It can also be used with fluorouracil, a type of chemotherapy, to treat colorectal cancer. It works by increasing the effects of fluorouracil in the body. This medicine may be used for other purposes; ask your health care provider or pharmacist if you have questions. What should I tell my care team before I take this medication? They need to know if you have any of these conditions: Anemia from low levels of vitamin B12 in the blood An unusual or allergic reaction to leucovorin, folic acid, other medications, foods, dyes, or preservatives Pregnant or trying to get pregnant Breastfeeding How should I use this medication? This medication is injected into a vein or a muscle. It is given by your care team in a hospital or clinic setting. Talk to your care team about the use of this medication in children. Special care may be needed. Overdosage: If you think you have taken too much of this medicine contact a poison control center or emergency room at once. NOTE: This medicine is only for you. Do not share this medicine with others. What if I miss a dose? Keep  appointments for follow-up doses. It is important not to miss your dose. Call your care team if you are unable to keep an appointment. What may interact with this medication? Capecitabine Fluorouracil Phenobarbital Phenytoin Primidone Trimethoprim;sulfamethoxazole This list may not describe all possible interactions. Give your health care provider a list of all the medicines, herbs, non-prescription drugs, or dietary supplements you use. Also tell them if you smoke, drink alcohol, or use illegal drugs. Some items may interact with your medicine. What should I watch for while using this medication? Your condition will be monitored carefully while you are receiving this medication. This medication may increase the side effects of 5-fluorouracil. Tell your care team if you have diarrhea or mouth sores that do not get better or that get worse. What side effects may I notice from receiving this medication? Side effects that you should report to your care team as soon as possible: Allergic reactions--skin rash,  itching, hives, swelling of the face, lips, tongue, or throat This list may not describe all possible side effects. Call your doctor for medical advice about side effects. You may report side effects to FDA at 1-800-FDA-1088. Where should I keep my medication? This medication is given in a hospital or clinic. It will not be stored at home. NOTE: This sheet is a summary. It may not cover all possible information. If you have questions about this medicine, talk to your doctor, pharmacist, or health care provider.  2024 Elsevier/Gold Standard (2022-05-08 00:00:00) Fluorouracil Injection What is this medication? FLUOROURACIL (flure oh YOOR a sil) treats some types of cancer. It works by slowing down the growth of cancer cells. This medicine may be used for other purposes; ask your health care provider or pharmacist if you have questions. COMMON BRAND NAME(S): Adrucil What should I tell my care  team before I take this medication? They need to know if you have any of these conditions: Blood disorders Dihydropyrimidine dehydrogenase (DPD) deficiency Infection, such as chickenpox, cold sores, herpes Kidney disease Liver disease Poor nutrition Recent or ongoing radiation therapy An unusual or allergic reaction to fluorouracil, other medications, foods, dyes, or preservatives If you or your partner are pregnant or trying to get pregnant Breast-feeding How should I use this medication? This medication is injected into a vein. It is administered by your care team in a hospital or clinic setting. Talk to your care team about the use of this medication in children. Special care may be needed. Overdosage: If you think you have taken too much of this medicine contact a poison control center or emergency room at once. NOTE: This medicine is only for you. Do not share this medicine with others. What if I miss a dose? Keep appointments for follow-up doses. It is important not to miss your dose. Call your care team if you are unable to keep an appointment. What may interact with this medication? Do not take this medication with any of the following: Live virus vaccines This medication may also interact with the following: Medications that treat or prevent blood clots, such as warfarin, enoxaparin, dalteparin This list may not describe all possible interactions. Give your health care provider a list of all the medicines, herbs, non-prescription drugs, or dietary supplements you use. Also tell them if you smoke, drink alcohol, or use illegal drugs. Some items may interact with your medicine. What should I watch for while using this medication? Your condition will be monitored carefully while you are receiving this medication. This medication may make you feel generally unwell. This is not uncommon as chemotherapy can affect healthy cells as well as cancer cells. Report any side effects. Continue  your course of treatment even though you feel ill unless your care team tells you to stop. In some cases, you may be given additional medications to help with side effects. Follow all directions for their use. This medication may increase your risk of getting an infection. Call your care team for advice if you get a fever, chills, sore throat, or other symptoms of a cold or flu. Do not treat yourself. Try to avoid being around people who are sick. This medication may increase your risk to bruise or bleed. Call your care team if you notice any unusual bleeding. Be careful brushing or flossing your teeth or using a toothpick because you may get an infection or bleed more easily. If you have any dental work done, tell your dentist you are receiving  this medication. Avoid taking medications that contain aspirin, acetaminophen, ibuprofen, naproxen, or ketoprofen unless instructed by your care team. These medications may hide a fever. Do not treat diarrhea with over the counter products. Contact your care team if you have diarrhea that lasts more than 2 days or if it is severe and watery. This medication can make you more sensitive to the sun. Keep out of the sun. If you cannot avoid being in the sun, wear protective clothing and sunscreen. Do not use sun lamps, tanning beds, or tanning booths. Talk to your care team if you or your partner wish to become pregnant or think you might be pregnant. This medication can cause serious birth defects if taken during pregnancy and for 3 months after the last dose. A reliable form of contraception is recommended while taking this medication and for 3 months after the last dose. Talk to your care team about effective forms of contraception. Do not father a child while taking this medication and for 3 months after the last dose. Use a condom while having sex during this time period. Do not breastfeed while taking this medication. This medication may cause infertility. Talk  to your care team if you are concerned about your fertility. What side effects may I notice from receiving this medication? Side effects that you should report to your care team as soon as possible: Allergic reactions--skin rash, itching, hives, swelling of the face, lips, tongue, or throat Heart attack--pain or tightness in the chest, shoulders, arms, or jaw, nausea, shortness of breath, cold or clammy skin, feeling faint or lightheaded Heart failure--shortness of breath, swelling of the ankles, feet, or hands, sudden weight gain, unusual weakness or fatigue Heart rhythm changes--fast or irregular heartbeat, dizziness, feeling faint or lightheaded, chest pain, trouble breathing High ammonia level--unusual weakness or fatigue, confusion, loss of appetite, nausea, vomiting, seizures Infection--fever, chills, cough, sore throat, wounds that don't heal, pain or trouble when passing urine, general feeling of discomfort or being unwell Low red blood cell level--unusual weakness or fatigue, dizziness, headache, trouble breathing Pain, tingling, or numbness in the hands or feet, muscle weakness, change in vision, confusion or trouble speaking, loss of balance or coordination, trouble walking, seizures Redness, swelling, and blistering of the skin over hands and feet Severe or prolonged diarrhea Unusual bruising or bleeding Side effects that usually do not require medical attention (report to your care team if they continue or are bothersome): Dry skin Headache Increased tears Nausea Pain, redness, or swelling with sores inside the mouth or throat Sensitivity to light Vomiting This list may not describe all possible side effects. Call your doctor for medical advice about side effects. You may report side effects to FDA at 1-800-FDA-1088. Where should I keep my medication? This medication is given in a hospital or clinic. It will not be stored at home. NOTE: This sheet is a summary. It may not cover  all possible information. If you have questions about this medicine, talk to your doctor, pharmacist, or health care provider.  2024 Elsevier/Gold Standard (2022-04-10 00:00:00)

## 2023-06-21 ENCOUNTER — Inpatient Hospital Stay: Payer: BC Managed Care – PPO

## 2023-06-21 VITALS — BP 130/82 | HR 76 | Temp 97.6°F | Resp 18

## 2023-06-21 DIAGNOSIS — Z5111 Encounter for antineoplastic chemotherapy: Secondary | ICD-10-CM | POA: Diagnosis not present

## 2023-06-21 DIAGNOSIS — C259 Malignant neoplasm of pancreas, unspecified: Secondary | ICD-10-CM

## 2023-06-21 MED ORDER — SODIUM CHLORIDE 0.9% FLUSH
10.0000 mL | INTRAVENOUS | Status: DC | PRN
  Administered 2023-06-21: 10 mL

## 2023-06-21 MED ORDER — PEGFILGRASTIM-CBQV 6 MG/0.6ML ~~LOC~~ SOSY
6.0000 mg | PREFILLED_SYRINGE | Freq: Once | SUBCUTANEOUS | Status: AC
  Administered 2023-06-21: 6 mg via SUBCUTANEOUS
  Filled 2023-06-21: qty 0.6

## 2023-06-21 MED ORDER — HEPARIN SOD (PORK) LOCK FLUSH 100 UNIT/ML IV SOLN
500.0000 [IU] | Freq: Once | INTRAVENOUS | Status: AC | PRN
  Administered 2023-06-21: 500 [IU]

## 2023-06-21 NOTE — Patient Instructions (Signed)

## 2023-06-21 NOTE — Progress Notes (Signed)
FIRST TIME CHEMO FOLLOW-UP Patient in pump d/c and udenyca injection.  Patient denied any issues with pump.  Patient did report hiccups that started Wednesday evening and they have been off and on.  Informed patient that the hiccups can be a side effect of the chemo and they should start to lessen in frequency within the next few days.  Patient verbalized understanding and agreed to contact office if he had any additional questions or concerns.

## 2023-06-23 ENCOUNTER — Encounter: Payer: Self-pay | Admitting: Oncology

## 2023-06-24 ENCOUNTER — Ambulatory Visit: Payer: PRIVATE HEALTH INSURANCE | Admitting: Hematology

## 2023-06-24 ENCOUNTER — Ambulatory Visit: Payer: PRIVATE HEALTH INSURANCE

## 2023-06-24 ENCOUNTER — Other Ambulatory Visit: Payer: Self-pay | Admitting: Nurse Practitioner

## 2023-06-24 ENCOUNTER — Other Ambulatory Visit: Payer: PRIVATE HEALTH INSURANCE

## 2023-06-24 DIAGNOSIS — C787 Secondary malignant neoplasm of liver and intrahepatic bile duct: Secondary | ICD-10-CM

## 2023-06-24 MED ORDER — GABAPENTIN 100 MG PO CAPS
100.0000 mg | ORAL_CAPSULE | Freq: Three times a day (TID) | ORAL | 1 refills | Status: DC
Start: 2023-06-24 — End: 2023-06-26

## 2023-06-25 ENCOUNTER — Telehealth: Payer: Self-pay | Admitting: *Deleted

## 2023-06-25 NOTE — Telephone Encounter (Signed)
Called David [name] to discuss his persistent hiccups. Informed him it could potentially related to oxaliplatin, irinotecan, or dexamethasone. MD suggests he give the gabapentin another day taking tid. Call back tomorrow afternoon with update and if persistent, will try reglan. He agrees.

## 2023-06-26 ENCOUNTER — Telehealth: Payer: Self-pay | Admitting: *Deleted

## 2023-06-26 MED ORDER — GABAPENTIN 100 MG PO CAPS: 200.0000 mg | ORAL_CAPSULE | Freq: Three times a day (TID) | ORAL | 0 refills | Status: DC

## 2023-06-26 NOTE — Telephone Encounter (Signed)
Reports no improvement in hiccups even with the gabapentin (has taken more than tid). Has been up since 0200 with hiccups. Reports they are "horrendous" and he can't sleep, eat and even has trouble catching his breath at times. Throat and abdomen hurt. States he refuses any more chemo until these hiccups are gone.

## 2023-06-26 NOTE — Telephone Encounter (Signed)
Per Dr. Truett Perna: Increase gabapentin to 200 mg tid. In rare cases, hiccups can last for weeks. Literature suggests to give gabapentin more time to work.

## 2023-06-27 ENCOUNTER — Telehealth: Payer: Self-pay | Admitting: *Deleted

## 2023-06-27 MED ORDER — METOCLOPRAMIDE HCL 10 MG PO TABS: 10.0000 mg | ORAL_TABLET | Freq: Three times a day (TID) | ORAL | 0 refills | Status: DC

## 2023-06-27 NOTE — Telephone Encounter (Signed)
Attempted to reach patient regarding change to reglan for hiccups at his request. Left VM to check his Mychart message regarding dosing, side effects and potential drug interactions.

## 2023-07-01 ENCOUNTER — Inpatient Hospital Stay: Payer: 59

## 2023-07-01 ENCOUNTER — Ambulatory Visit: Payer: PRIVATE HEALTH INSURANCE | Admitting: Hematology

## 2023-07-01 ENCOUNTER — Ambulatory Visit: Payer: PRIVATE HEALTH INSURANCE

## 2023-07-01 ENCOUNTER — Other Ambulatory Visit: Payer: Self-pay | Admitting: Nurse Practitioner

## 2023-07-01 ENCOUNTER — Inpatient Hospital Stay: Payer: 59 | Admitting: Nurse Practitioner

## 2023-07-01 ENCOUNTER — Other Ambulatory Visit: Payer: PRIVATE HEALTH INSURANCE

## 2023-07-01 DIAGNOSIS — C259 Malignant neoplasm of pancreas, unspecified: Secondary | ICD-10-CM

## 2023-07-03 ENCOUNTER — Inpatient Hospital Stay (HOSPITAL_BASED_OUTPATIENT_CLINIC_OR_DEPARTMENT_OTHER): Payer: 59 | Admitting: Nurse Practitioner

## 2023-07-03 ENCOUNTER — Inpatient Hospital Stay: Payer: 59

## 2023-07-03 ENCOUNTER — Encounter: Payer: Self-pay | Admitting: Nurse Practitioner

## 2023-07-03 ENCOUNTER — Inpatient Hospital Stay: Payer: BC Managed Care – PPO

## 2023-07-03 VITALS — BP 141/90 | HR 83 | Temp 97.1°F | Resp 16 | Wt 157.2 lb

## 2023-07-03 DIAGNOSIS — C787 Secondary malignant neoplasm of liver and intrahepatic bile duct: Secondary | ICD-10-CM | POA: Diagnosis not present

## 2023-07-03 DIAGNOSIS — C259 Malignant neoplasm of pancreas, unspecified: Secondary | ICD-10-CM

## 2023-07-03 DIAGNOSIS — Z5111 Encounter for antineoplastic chemotherapy: Secondary | ICD-10-CM | POA: Diagnosis not present

## 2023-07-03 LAB — CMP (CANCER CENTER ONLY)
ALT: 21 U/L (ref 0–44)
AST: 14 U/L — ABNORMAL LOW (ref 15–41)
Albumin: 4.2 g/dL (ref 3.5–5.0)
Alkaline Phosphatase: 142 U/L — ABNORMAL HIGH (ref 38–126)
Anion gap: 8 (ref 5–15)
BUN: 19 mg/dL (ref 6–20)
CO2: 27 mmol/L (ref 22–32)
Calcium: 9.7 mg/dL (ref 8.9–10.3)
Chloride: 100 mmol/L (ref 98–111)
Creatinine: 0.81 mg/dL (ref 0.61–1.24)
GFR, Estimated: >60 mL/min (ref >60)
Glucose, Bld: 259 mg/dL — ABNORMAL HIGH (ref 70–99)
Potassium: 4.4 mmol/L (ref 3.5–5.1)
Sodium: 135 mmol/L (ref 135–145)
Total Bilirubin: 0.5 mg/dL (ref 0.3–1.2)
Total Protein: 7.1 g/dL (ref 6.5–8.1)

## 2023-07-03 LAB — CBC WITH DIFFERENTIAL (CANCER CENTER ONLY)
Abs Immature Granulocytes: 0.2 10*3/uL — ABNORMAL HIGH (ref 0.00–0.07)
Band Neutrophils: 9 %
Basophils Absolute: 0 10*3/uL (ref 0.0–0.1)
Basophils Relative: 0 %
Eosinophils Absolute: 0.3 10*3/uL (ref 0.0–0.5)
Eosinophils Relative: 7 %
HCT: 38.6 % — ABNORMAL LOW (ref 39.0–52.0)
Hemoglobin: 13.2 g/dL (ref 13.0–17.0)
Lymphocytes Relative: 32 %
Lymphs Abs: 1.6 10*3/uL (ref 0.7–4.0)
MCH: 29.6 pg (ref 26.0–34.0)
MCHC: 34.2 g/dL (ref 30.0–36.0)
MCV: 86.5 fL (ref 80.0–100.0)
Monocytes Absolute: 0.5 10*3/uL (ref 0.1–1.0)
Monocytes Relative: 10 %
Myelocytes: 4 %
Neutro Abs: 2.3 10*3/uL (ref 1.7–7.7)
Neutrophils Relative %: 38 %
Platelet Count: 343 10*3/uL (ref 150–400)
RBC: 4.46 MIL/uL (ref 4.22–5.81)
RDW: 12.4 % (ref 11.5–15.5)
WBC Count: 4.9 10*3/uL (ref 4.0–10.5)
nRBC: 0 % (ref 0.0–0.2)

## 2023-07-03 MED ORDER — SODIUM CHLORIDE 0.9 % IV SOLN
150.0000 mg | Freq: Once | INTRAVENOUS | Status: AC
  Administered 2023-07-03: 150 mg via INTRAVENOUS
  Filled 2023-07-03: qty 150

## 2023-07-03 MED ORDER — OXALIPLATIN CHEMO INJECTION 100 MG/20ML
85.0000 mg/m2 | Freq: Once | INTRAVENOUS | Status: AC
  Administered 2023-07-03: 150 mg via INTRAVENOUS
  Filled 2023-07-03: qty 20

## 2023-07-03 MED ORDER — SODIUM CHLORIDE 0.9 % IV SOLN
150.0000 mg/m2 | Freq: Once | INTRAVENOUS | Status: AC
  Administered 2023-07-03: 300 mg via INTRAVENOUS
  Filled 2023-07-03: qty 15

## 2023-07-03 MED ORDER — PALONOSETRON HCL INJECTION 0.25 MG/5ML
0.2500 mg | Freq: Once | INTRAVENOUS | Status: AC
  Administered 2023-07-03: 0.25 mg via INTRAVENOUS
  Filled 2023-07-03: qty 5

## 2023-07-03 MED ORDER — ATROPINE SULFATE 1 MG/ML IV SOLN
0.5000 mg | Freq: Once | INTRAVENOUS | Status: AC | PRN
  Administered 2023-07-03: 0.5 mg via INTRAVENOUS
  Filled 2023-07-03: qty 1

## 2023-07-03 MED ORDER — SODIUM CHLORIDE 0.9 % IV SOLN
10.0000 mg | Freq: Once | INTRAVENOUS | Status: AC
  Administered 2023-07-03: 10 mg via INTRAVENOUS
  Filled 2023-07-03: qty 10

## 2023-07-03 MED ORDER — SODIUM CHLORIDE 0.9% FLUSH
10.0000 mL | INTRAVENOUS | Status: DC | PRN
  Administered 2023-07-03: 10 mL

## 2023-07-03 MED ORDER — SODIUM CHLORIDE 0.9 % IV SOLN
2400.0000 mg/m2 | INTRAVENOUS | Status: DC
  Administered 2023-07-03: 5000 mg via INTRAVENOUS
  Filled 2023-07-03: qty 100

## 2023-07-03 MED ORDER — SODIUM CHLORIDE 0.9 % IV SOLN
400.0000 mg/m2 | Freq: Once | INTRAVENOUS | Status: AC
  Administered 2023-07-03: 752 mg via INTRAVENOUS
  Filled 2023-07-03: qty 37.6

## 2023-07-03 MED ORDER — DEXTROSE 5 % IV SOLN: Freq: Once | INTRAVENOUS | Status: AC

## 2023-07-03 NOTE — Progress Notes (Signed)
  [facility] OFFICE PROGRESS NOTE   Diagnosis: Pancreas cancer  INTERVAL HISTORY:   David [name] returns as scheduled.  He completed cycle 1 FOLFIRINOX 06/19/2023.  He denies significant nausea.  No mouth sores.  1 episode of diarrhea.  He had severe hiccups.  The hiccups did not respond to baclofen or gabapentin.  The hiccups resolved once he began Reglan.  Objective:  Vital signs in last 24 hours:  Blood pressure (!) 141/90, pulse 83, temperature (!) 97.1 F (36.2 C), temperature source Tympanic, resp. rate 16, weight 157 lb 3.2 oz (71.3 kg), SpO2 100%.    HEENT: No thrush or ulcers. Resp: Lungs clear bilaterally. Cardio: Regular rate and rhythm. GI: Abdomen soft and nontender.  No hepatosplenomegaly. Vascular: No leg edema. Skin: Palms without erythema. Port-A-Cath without erythema.  Lab Results:  Lab Results  Component Value Date   WBC 4.9 07/03/2023   HGB 13.2 07/03/2023   HCT 38.6 (L) 07/03/2023   MCV 86.5 07/03/2023   PLT 343 07/03/2023   NEUTROABS 2.3 07/03/2023    Imaging:  No results found.  Medications: I have reviewed the patient's current medications.  Assessment/Plan: Pancreas cancer CT angiogram chest 04/24/2023-low-density mass in the dome of the liver with at least 3 additional lesions in both hepatic lobes, borderline lymphadenopathy in the hepatoduodenal ligament Right upper quadrant ultrasound 04/24/2023-large circumscribed mass of the liver dome CT abdomen/pelvis 04/24/2023-multiple liver lesions poorly defined on noncontrast exam, prominent portacaval and porta hepatis nodes MRI abdomen 05/05/2023-multiple rim-hypoenhancing liver lesions consistent with hepatic metastases, suspicion for a pancreas tail mass Ultrasound-guided biopsy of dominant right liver lesion 05/10/2023-adenocarcinoma, CK7 and CDX2 positive, abundant cytoplasm and mucin with extracellular mucin, foci of lymphovascular invasion Tempus gene panel-K-ras G12V, T P53 mild  tumor mutation burden 3.2, MSS PET 06/04/2023-multiple hypermetabolic liver lesions consistent with metastases, low-level FDG uptake in the soft tissue fullness at the tip of the pancreas tail, low-level FDG activity involving a small soft tissue nodule between the gastric fundus and spleen, tiny foci of accumulation at the right costovertebral junction at T10 and roof of the left acetabulum without an underlying CT lesion Evaded CEA and CA 19-9 EUS six 2124-22 x 15 mm irregular mass in the tail the pancreas, 11 mm subcarinal lymph node, FNA biopsy of the pancreas mass-suspicious for malignancy Cycle 1 FOLFIRINOX 06/19/2023 Cycle 2 FOLFIRINOX 07/03/2023 Family history of pancreas cancerINVITAE panel 05/17/2023-NF1 VUS Severe hiccups following cycle 1 FOLFIRINOX, did not respond to baclofen or gabapentin.  Resolved with Reglan.  Disposition: [name] appears stable.  He has completed 1 cycle of FOLFIRINOX.  Postchemotherapy course complicated by severe hiccups which eventually responded to Reglan.  He understands the hiccups could recur.  He will begin Reglan as soon as the hiccups began and discontinue Reglan the day after hiccups resolve.  CBC and chemistry panel reviewed.  Labs adequate to proceed as above.  Blood sugar is elevated.  He will monitor closely over the next few days and contact us with persistent high readings.  He will avoid concentrated sweets.  He will return for follow-up and cycle 3 FOLFIRINOX in 2 weeks.  We are available to see him sooner if needed.    Lonna Cobb ANP/GNP-BC   07/03/2023  9:23 AM

## 2023-07-03 NOTE — Progress Notes (Signed)
 Patient seen by Lisa Thomas NP today  Vitals are within treatment parameters.  Labs reviewed by Lisa Thomas NP and are within treatment parameters.  Per physician team, patient is ready for treatment and there are NO modifications to the treatment plan.     

## 2023-07-03 NOTE — Patient Instructions (Signed)
[facility] [facility]  Discharge Instructions: Thank you for choosing [facility] to provide your oncology and hematology care.   If you have a lab appointment with the Cancer Center, please go directly to the Cancer Center and check in at the registration area.   Wear comfortable clothing and clothing appropriate for easy access to any Portacath or PICC line.   We strive to give you quality time with your provider. You may need to reschedule your appointment if you arrive late (15 or more minutes).  Arriving late affects you and other patients whose appointments are after yours.  Also, if you miss three or more appointments without notifying the office, you may be dismissed from the clinic at the provider's discretion.      For prescription refill requests, have your pharmacy contact our office and allow 72 hours for refills to be completed.    Today you received the following chemotherapy and/or immunotherapy agents: oxaliplatin, irinotecan, leucovorin, fluorouracil.       To help prevent nausea and vomiting after your treatment, we encourage you to take your nausea medication as directed.  BELOW ARE SYMPTOMS THAT SHOULD BE REPORTED IMMEDIATELY: *FEVER GREATER THAN 100.4 F (38 C) OR HIGHER *CHILLS OR SWEATING *NAUSEA AND VOMITING THAT IS NOT CONTROLLED WITH YOUR NAUSEA MEDICATION *UNUSUAL SHORTNESS OF BREATH *UNUSUAL BRUISING OR BLEEDING *URINARY PROBLEMS (pain or burning when urinating, or frequent urination) *BOWEL PROBLEMS (unusual diarrhea, constipation, pain near the anus) TENDERNESS IN MOUTH AND THROAT WITH OR WITHOUT PRESENCE OF ULCERS (sore throat, sores in mouth, or a toothache) UNUSUAL RASH, SWELLING OR PAIN  UNUSUAL VAGINAL DISCHARGE OR ITCHING   Items with * indicate a potential emergency and should be followed up as soon as possible or go to the Emergency Department if any problems should occur.  Please show the CHEMOTHERAPY ALERT  CARD or IMMUNOTHERAPY ALERT CARD at check-in to the Emergency Department and triage nurse.  Should you have questions after your visit or need to cancel or reschedule your appointment, please contact [place] [facility]  Dept: [phone]  and follow the prompts.  Office hours are 8:00 a.m. to 4:30 p.m. Monday - Friday. Please note that voicemails left after 4:00 p.m. may not be returned until the following business day.  We are closed weekends and major holidays. You have access to a nurse at all times for urgent questions. Please call the main number to the clinic Dept: [phone] and follow the prompts.   For any non-urgent questions, you may also contact your provider using MyChart. We now offer e-Visits for anyone 58 and older to request care online for non-urgent symptoms. For details visit [email or web address].   Also download the MyChart app! Go to the app store, search "MyChart", open the app, select [place], and log in with your MyChart username and password.  Oxaliplatin Injection What is this medication? OXALIPLATIN (ox AL i PLA tin) treats colorectal cancer. It works by slowing down the growth of cancer cells. This medicine may be used for other purposes; ask your health care provider or pharmacist if you have questions. COMMON BRAND NAME(S): Eloxatin What should I tell my care team before I take this medication? They need to know if you have any of these conditions: Heart disease History of irregular heartbeat or rhythm Liver disease Low blood cell levels (white cells, red cells, and platelets) Lung or breathing disease, such as asthma Take medications that treat or prevent  blood clots Tingling of the fingers, toes, or other nerve disorder An unusual or allergic reaction to oxaliplatin, other medications, foods, dyes, or preservatives If you or your partner are pregnant or trying to get pregnant Breast-feeding How should I use this  medication? This medication is injected into a vein. It is given by your care team in a hospital or clinic setting. Talk to your care team about the use of this medication in children. Special care may be needed. Overdosage: If you think you have taken too much of this medicine contact a poison control center or emergency room at once. NOTE: This medicine is only for you. Do not share this medicine with others. What if I miss a dose? Keep appointments for follow-up doses. It is important not to miss a dose. Call your care team if you are unable to keep an appointment. What may interact with this medication? Do not take this medication with any of the following: Cisapride Dronedarone Pimozide Thioridazine This medication may also interact with the following: Aspirin and aspirin-like medications Certain medications that treat or prevent blood clots, such as warfarin, apixaban, dabigatran, and rivaroxaban Cisplatin Cyclosporine Diuretics Medications for infection, such as acyclovir, adefovir, amphotericin B, bacitracin, cidofovir, foscarnet, ganciclovir, gentamicin, pentamidine, vancomycin NSAIDs, medications for pain and inflammation, such as ibuprofen or naproxen Other medications that cause heart rhythm changes Pamidronate Zoledronic acid This list may not describe all possible interactions. Give your health care provider a list of all the medicines, herbs, non-prescription drugs, or dietary supplements you use. Also tell them if you smoke, drink alcohol, or use illegal drugs. Some items may interact with your medicine. What should I watch for while using this medication? Your condition will be monitored carefully while you are receiving this medication. You may need blood work while taking this medication. This medication may make you feel generally unwell. This is not uncommon as chemotherapy can affect healthy cells as well as cancer cells. Report any side effects. Continue your course  of treatment even though you feel ill unless your care team tells you to stop. This medication may increase your risk of getting an infection. Call your care team for advice if you get a fever, chills, sore throat, or other symptoms of a cold or flu. Do not treat yourself. Try to avoid being around people who are sick. Avoid taking medications that contain aspirin, acetaminophen, ibuprofen, naproxen, or ketoprofen unless instructed by your care team. These medications may hide a fever. Be careful brushing or flossing your teeth or using a toothpick because you may get an infection or bleed more easily. If you have any dental work done, tell your dentist you are receiving this medication. This medication can make you more sensitive to cold. Do not drink cold drinks or use ice. Cover exposed skin before coming in contact with cold temperatures or cold objects. When out in cold weather wear warm clothing and cover your mouth and nose to warm the air that goes into your lungs. Tell your care team if you get sensitive to the cold. Talk to your care team if you or your partner are pregnant or think either of you might be pregnant. This medication can cause serious birth defects if taken during pregnancy and for 9 months after the last dose. A negative pregnancy test is required before starting this medication. A reliable form of contraception is recommended while taking this medication and for 9 months after the last dose. Talk to your care team about   effective forms of contraception. Do not father a child while taking this medication and for 6 months after the last dose. Use a condom while having sex during this time period. Do not breastfeed while taking this medication and for 3 months after the last dose. This medication may cause infertility. Talk to your care team if you are concerned about your fertility. What side effects may I notice from receiving this medication? Side effects that you should report to  your care team as soon as possible: Allergic reactions--skin rash, itching, hives, swelling of the face, lips, tongue, or throat Bleeding--bloody or black, tar-like stools, vomiting blood or brown material that looks like coffee grounds, red or dark brown urine, small red or purple spots on skin, unusual bruising or bleeding Dry cough, shortness of breath or trouble breathing Heart rhythm changes--fast or irregular heartbeat, dizziness, feeling faint or lightheaded, chest pain, trouble breathing Infection--fever, chills, cough, sore throat, wounds that don't heal, pain or trouble when passing urine, general feeling of discomfort or being unwell Liver injury--right upper belly pain, loss of appetite, nausea, light-colored stool, dark yellow or brown urine, yellowing skin or eyes, unusual weakness or fatigue Low red blood cell level--unusual weakness or fatigue, dizziness, headache, trouble breathing Muscle injury--unusual weakness or fatigue, muscle pain, dark yellow or brown urine, decrease in amount of urine Pain, tingling, or numbness in the hands or feet Sudden and severe headache, confusion, change in vision, seizures, which may be signs of posterior reversible encephalopathy syndrome (PRES) Unusual bruising or bleeding Side effects that usually do not require medical attention (report to your care team if they continue or are bothersome): Diarrhea Nausea Pain, redness, or swelling with sores inside the mouth or throat Unusual weakness or fatigue Vomiting This list may not describe all possible side effects. Call your doctor for medical advice about side effects. You may report side effects to FDA at 1-800-FDA-1088. Where should I keep my medication? This medication is given in a hospital or clinic. It will not be stored at home. NOTE: This sheet is a summary. It may not cover all possible information. If you have questions about this medicine, talk to your doctor, pharmacist, or health care  provider.  2024 Elsevier/Gold Standard (2023-02-10 00:00:00) Irinotecan Injection What is this medication? IRINOTECAN (ir in oh TEE kan) treats some types of cancer. It works by slowing down the growth of cancer cells. This medicine may be used for other purposes; ask your health care provider or pharmacist if you have questions. COMMON BRAND NAME(S): Camptosar What should I tell my care team before I take this medication? They need to know if you have any of these conditions: Dehydration Diarrhea Infection, especially a viral infection, such as chickenpox, cold sores, herpes Liver disease Low blood cell levels (white cells, red cells, and platelets) Low levels of electrolytes, such as calcium, magnesium, or potassium in your blood Recent or ongoing radiation An unusual or allergic reaction to irinotecan, other medications, foods, dyes, or preservatives If you or your partner are pregnant or trying to get pregnant Breast-feeding How should I use this medication? This medication is injected into a vein. It is given by your care team in a hospital or clinic setting. Talk to your care team about the use of this medication in children. Special care may be needed. Overdosage: If you think you have taken too much of this medicine contact a poison control center or emergency room at once. NOTE: This medicine is only for you. Do  not share this medicine with others. What if I miss a dose? Keep appointments for follow-up doses. It is important not to miss your dose. Call your care team if you are unable to keep an appointment. What may interact with this medication? Do not take this medication with any of the following: Cobicistat Itraconazole This medication may also interact with the following: Certain antibiotics, such as clarithromycin, rifampin, rifabutin Certain antivirals for HIV or AIDS Certain medications for fungal infections, such as ketoconazole, posaconazole,  voriconazole Certain medications for seizures, such as carbamazepine, phenobarbital, phenytoin Gemfibrozil Nefazodone St. John's wort This list may not describe all possible interactions. Give your health care provider a list of all the medicines, herbs, non-prescription drugs, or dietary supplements you use. Also tell them if you smoke, drink alcohol, or use illegal drugs. Some items may interact with your medicine. What should I watch for while using this medication? Your condition will be monitored carefully while you are receiving this medication. You may need blood work while taking this medication. This medication may make you feel generally unwell. This is not uncommon as chemotherapy can affect healthy cells as well as cancer cells. Report any side effects. Continue your course of treatment even though you feel ill unless your care team tells you to stop. This medication can cause serious side effects. To reduce the risk, your care team may give you other medications to take before receiving this one. Be sure to follow the directions from your care team. This medication may affect your coordination, reaction time, or judgement. Do not drive or operate machinery until you know how this medication affects you. Sit up or stand slowly to reduce the risk of dizzy or fainting spells. Drinking alcohol with this medication can increase the risk of these side effects. This medication may increase your risk of getting an infection. Call your care team for advice if you get a fever, chills, sore throat, or other symptoms of a cold or flu. Do not treat yourself. Try to avoid being around people who are sick. Avoid taking medications that contain aspirin, acetaminophen, ibuprofen, naproxen, or ketoprofen unless instructed by your care team. These medications may hide a fever. This medication may increase your risk to bruise or bleed. Call your care team if you notice any unusual bleeding. Be careful  brushing or flossing your teeth or using a toothpick because you may get an infection or bleed more easily. If you have any dental work done, tell your dentist you are receiving this medication. Talk to your care team if you or your partner are pregnant or think either of you might be pregnant. This medication can cause serious birth defects if taken during pregnancy and for 6 months after the last dose. You will need a negative pregnancy test before starting this medication. Contraception is recommended while taking this medication and for 6 months after the last dose. Your care team can help you find the option that works for you. Do not father a child while taking this medication and for 3 months after the last dose. Use a condom for contraception during this time period. Do not breastfeed while taking this medication and for 7 days after the last dose. This medication may cause infertility. Talk to your care team if you are concerned about your fertility. What side effects may I notice from receiving this medication? Side effects that you should report to your care team as soon as possible: Allergic reactions--skin rash, itching, hives, swelling of  the face, lips, tongue, or throat Dry cough, shortness of breath or trouble breathing Increased saliva or tears, increased sweating, stomach cramping, diarrhea, small pupils, unusual weakness or fatigue, slow heartbeat Infection--fever, chills, cough, sore throat, wounds that don't heal, pain or trouble when passing urine, general feeling of discomfort or being unwell Kidney injury--decrease in the amount of urine, swelling of the ankles, hands, or feet Low red blood cell level--unusual weakness or fatigue, dizziness, headache, trouble breathing Severe or prolonged diarrhea Unusual bruising or bleeding Side effects that usually do not require medical attention (report to your care team if they continue or are bothersome): Constipation Diarrhea Hair  loss Loss of appetite Nausea Stomach pain This list may not describe all possible side effects. Call your doctor for medical advice about side effects. You may report side effects to FDA at 1-800-FDA-1088. Where should I keep my medication? This medication is given in a hospital or clinic. It will not be stored at home. NOTE: This sheet is a summary. It may not cover all possible information. If you have questions about this medicine, talk to your doctor, pharmacist, or health care provider.  2024 Elsevier/Gold Standard (2022-04-16 00:00:00) Leucovorin Injection What is this medication? LEUCOVORIN (loo koe VOR in) prevents side effects from certain medications, such as methotrexate. It works by increasing folate levels. This helps protect healthy cells in your body. It may also be used to treat anemia caused by low levels of folate. It can also be used with fluorouracil, a type of chemotherapy, to treat colorectal cancer. It works by increasing the effects of fluorouracil in the body. This medicine may be used for other purposes; ask your health care provider or pharmacist if you have questions. What should I tell my care team before I take this medication? They need to know if you have any of these conditions: Anemia from low levels of vitamin B12 in the blood An unusual or allergic reaction to leucovorin, folic acid, other medications, foods, dyes, or preservatives Pregnant or trying to get pregnant Breastfeeding How should I use this medication? This medication is injected into a vein or a muscle. It is given by your care team in a hospital or clinic setting. Talk to your care team about the use of this medication in children. Special care may be needed. Overdosage: If you think you have taken too much of this medicine contact a poison control center or emergency room at once. NOTE: This medicine is only for you. Do not share this medicine with others. What if I miss a dose? Keep  appointments for follow-up doses. It is important not to miss your dose. Call your care team if you are unable to keep an appointment. What may interact with this medication? Capecitabine Fluorouracil Phenobarbital Phenytoin Primidone Trimethoprim;sulfamethoxazole This list may not describe all possible interactions. Give your health care provider a list of all the medicines, herbs, non-prescription drugs, or dietary supplements you use. Also tell them if you smoke, drink alcohol, or use illegal drugs. Some items may interact with your medicine. What should I watch for while using this medication? Your condition will be monitored carefully while you are receiving this medication. This medication may increase the side effects of 5-fluorouracil. Tell your care team if you have diarrhea or mouth sores that do not get better or that get worse. What side effects may I notice from receiving this medication? Side effects that you should report to your care team as soon as possible: Allergic reactions--skin rash,  itching, hives, swelling of the face, lips, tongue, or throat This list may not describe all possible side effects. Call your doctor for medical advice about side effects. You may report side effects to FDA at 1-800-FDA-1088. Where should I keep my medication? This medication is given in a hospital or clinic. It will not be stored at home. NOTE: This sheet is a summary. It may not cover all possible information. If you have questions about this medicine, talk to your doctor, pharmacist, or health care provider.  2024 Elsevier/Gold Standard (2022-05-08 00:00:00) Fluorouracil Injection What is this medication? FLUOROURACIL (flure oh YOOR a sil) treats some types of cancer. It works by slowing down the growth of cancer cells. This medicine may be used for other purposes; ask your health care provider or pharmacist if you have questions. COMMON BRAND NAME(S): Adrucil What should I tell my care  team before I take this medication? They need to know if you have any of these conditions: Blood disorders Dihydropyrimidine dehydrogenase (DPD) deficiency Infection, such as chickenpox, cold sores, herpes Kidney disease Liver disease Poor nutrition Recent or ongoing radiation therapy An unusual or allergic reaction to fluorouracil, other medications, foods, dyes, or preservatives If you or your partner are pregnant or trying to get pregnant Breast-feeding How should I use this medication? This medication is injected into a vein. It is administered by your care team in a hospital or clinic setting. Talk to your care team about the use of this medication in children. Special care may be needed. Overdosage: If you think you have taken too much of this medicine contact a poison control center or emergency room at once. NOTE: This medicine is only for you. Do not share this medicine with others. What if I miss a dose? Keep appointments for follow-up doses. It is important not to miss your dose. Call your care team if you are unable to keep an appointment. What may interact with this medication? Do not take this medication with any of the following: Live virus vaccines This medication may also interact with the following: Medications that treat or prevent blood clots, such as warfarin, enoxaparin, dalteparin This list may not describe all possible interactions. Give your health care provider a list of all the medicines, herbs, non-prescription drugs, or dietary supplements you use. Also tell them if you smoke, drink alcohol, or use illegal drugs. Some items may interact with your medicine. What should I watch for while using this medication? Your condition will be monitored carefully while you are receiving this medication. This medication may make you feel generally unwell. This is not uncommon as chemotherapy can affect healthy cells as well as cancer cells. Report any side effects. Continue  your course of treatment even though you feel ill unless your care team tells you to stop. In some cases, you may be given additional medications to help with side effects. Follow all directions for their use. This medication may increase your risk of getting an infection. Call your care team for advice if you get a fever, chills, sore throat, or other symptoms of a cold or flu. Do not treat yourself. Try to avoid being around people who are sick. This medication may increase your risk to bruise or bleed. Call your care team if you notice any unusual bleeding. Be careful brushing or flossing your teeth or using a toothpick because you may get an infection or bleed more easily. If you have any dental work done, tell your dentist you are receiving  this medication. Avoid taking medications that contain aspirin, acetaminophen, ibuprofen, naproxen, or ketoprofen unless instructed by your care team. These medications may hide a fever. Do not treat diarrhea with over the counter products. Contact your care team if you have diarrhea that lasts more than 2 days or if it is severe and watery. This medication can make you more sensitive to the sun. Keep out of the sun. If you cannot avoid being in the sun, wear protective clothing and sunscreen. Do not use sun lamps, tanning beds, or tanning booths. Talk to your care team if you or your partner wish to become pregnant or think you might be pregnant. This medication can cause serious birth defects if taken during pregnancy and for 3 months after the last dose. A reliable form of contraception is recommended while taking this medication and for 3 months after the last dose. Talk to your care team about effective forms of contraception. Do not father a child while taking this medication and for 3 months after the last dose. Use a condom while having sex during this time period. Do not breastfeed while taking this medication. This medication may cause infertility. Talk  to your care team if you are concerned about your fertility. What side effects may I notice from receiving this medication? Side effects that you should report to your care team as soon as possible: Allergic reactions--skin rash, itching, hives, swelling of the face, lips, tongue, or throat Heart attack--pain or tightness in the chest, shoulders, arms, or jaw, nausea, shortness of breath, cold or clammy skin, feeling faint or lightheaded Heart failure--shortness of breath, swelling of the ankles, feet, or hands, sudden weight gain, unusual weakness or fatigue Heart rhythm changes--fast or irregular heartbeat, dizziness, feeling faint or lightheaded, chest pain, trouble breathing High ammonia level--unusual weakness or fatigue, confusion, loss of appetite, nausea, vomiting, seizures Infection--fever, chills, cough, sore throat, wounds that don't heal, pain or trouble when passing urine, general feeling of discomfort or being unwell Low red blood cell level--unusual weakness or fatigue, dizziness, headache, trouble breathing Pain, tingling, or numbness in the hands or feet, muscle weakness, change in vision, confusion or trouble speaking, loss of balance or coordination, trouble walking, seizures Redness, swelling, and blistering of the skin over hands and feet Severe or prolonged diarrhea Unusual bruising or bleeding Side effects that usually do not require medical attention (report to your care team if they continue or are bothersome): Dry skin Headache Increased tears Nausea Pain, redness, or swelling with sores inside the mouth or throat Sensitivity to light Vomiting This list may not describe all possible side effects. Call your doctor for medical advice about side effects. You may report side effects to FDA at 1-800-FDA-1088. Where should I keep my medication? This medication is given in a hospital or clinic. It will not be stored at home. NOTE: This sheet is a summary. It may not cover  all possible information. If you have questions about this medicine, talk to your doctor, pharmacist, or health care provider.  2024 Elsevier/Gold Standard (2022-04-10 00:00:00)

## 2023-07-04 ENCOUNTER — Other Ambulatory Visit: Payer: Self-pay

## 2023-07-05 ENCOUNTER — Inpatient Hospital Stay: Payer: BC Managed Care – PPO

## 2023-07-05 VITALS — BP 133/75 | HR 86 | Temp 97.5°F | Resp 18

## 2023-07-05 DIAGNOSIS — Z5111 Encounter for antineoplastic chemotherapy: Secondary | ICD-10-CM | POA: Diagnosis not present

## 2023-07-05 DIAGNOSIS — C787 Secondary malignant neoplasm of liver and intrahepatic bile duct: Secondary | ICD-10-CM

## 2023-07-05 MED ORDER — HEPARIN SOD (PORK) LOCK FLUSH 100 UNIT/ML IV SOLN
500.0000 [IU] | Freq: Once | INTRAVENOUS | Status: AC | PRN
  Administered 2023-07-05: 500 [IU]

## 2023-07-05 MED ORDER — PEGFILGRASTIM-CBQV 6 MG/0.6ML ~~LOC~~ SOSY
6.0000 mg | PREFILLED_SYRINGE | Freq: Once | SUBCUTANEOUS | Status: AC
  Administered 2023-07-05: 6 mg via SUBCUTANEOUS
  Filled 2023-07-05: qty 0.6

## 2023-07-05 MED ORDER — SODIUM CHLORIDE 0.9% FLUSH
10.0000 mL | INTRAVENOUS | Status: DC | PRN
  Administered 2023-07-05: 10 mL

## 2023-07-15 ENCOUNTER — Encounter: Payer: Self-pay | Admitting: Nurse Practitioner

## 2023-07-15 ENCOUNTER — Inpatient Hospital Stay: Payer: 59

## 2023-07-15 ENCOUNTER — Inpatient Hospital Stay: Payer: 59 | Admitting: Oncology

## 2023-07-17 ENCOUNTER — Other Ambulatory Visit (HOSPITAL_BASED_OUTPATIENT_CLINIC_OR_DEPARTMENT_OTHER): Payer: Self-pay

## 2023-07-17 ENCOUNTER — Inpatient Hospital Stay: Payer: 59

## 2023-07-17 ENCOUNTER — Encounter: Payer: Self-pay | Admitting: Nurse Practitioner

## 2023-07-17 ENCOUNTER — Encounter: Payer: Self-pay | Admitting: Hematology

## 2023-07-17 ENCOUNTER — Inpatient Hospital Stay: Payer: 59 | Admitting: Nutrition

## 2023-07-17 ENCOUNTER — Inpatient Hospital Stay (HOSPITAL_BASED_OUTPATIENT_CLINIC_OR_DEPARTMENT_OTHER): Payer: 59 | Admitting: Nurse Practitioner

## 2023-07-17 VITALS — BP 130/91 | HR 96 | Temp 98.2°F | Resp 18 | Ht 68.0 in | Wt 155.6 lb

## 2023-07-17 VITALS — BP 148/84 | HR 77

## 2023-07-17 DIAGNOSIS — Z5111 Encounter for antineoplastic chemotherapy: Secondary | ICD-10-CM | POA: Diagnosis not present

## 2023-07-17 DIAGNOSIS — C787 Secondary malignant neoplasm of liver and intrahepatic bile duct: Secondary | ICD-10-CM | POA: Diagnosis not present

## 2023-07-17 DIAGNOSIS — C259 Malignant neoplasm of pancreas, unspecified: Secondary | ICD-10-CM | POA: Diagnosis not present

## 2023-07-17 LAB — CMP (CANCER CENTER ONLY)
ALT: 23 U/L (ref 0–44)
AST: 19 U/L (ref 15–41)
Albumin: 4.2 g/dL (ref 3.5–5.0)
Alkaline Phosphatase: 118 U/L (ref 38–126)
Anion gap: 8 (ref 5–15)
BUN: 22 mg/dL — ABNORMAL HIGH (ref 6–20)
CO2: 26 mmol/L (ref 22–32)
Calcium: 9.6 mg/dL (ref 8.9–10.3)
Chloride: 99 mmol/L (ref 98–111)
Creatinine: 0.81 mg/dL (ref 0.61–1.24)
GFR, Estimated: >60 mL/min (ref >60)
Glucose, Bld: 249 mg/dL — ABNORMAL HIGH (ref 70–99)
Potassium: 4.4 mmol/L (ref 3.5–5.1)
Sodium: 133 mmol/L — ABNORMAL LOW (ref 135–145)
Total Bilirubin: 0.5 mg/dL (ref 0.3–1.2)
Total Protein: 7.3 g/dL (ref 6.5–8.1)

## 2023-07-17 LAB — CBC WITH DIFFERENTIAL (CANCER CENTER ONLY)
Abs Immature Granulocytes: 0.09 10*3/uL — ABNORMAL HIGH (ref 0.00–0.07)
Basophils Absolute: 0 10*3/uL (ref 0.0–0.1)
Basophils Relative: 1 %
Eosinophils Absolute: 0.2 10*3/uL (ref 0.0–0.5)
Eosinophils Relative: 3 %
HCT: 40 % (ref 39.0–52.0)
Hemoglobin: 13.8 g/dL (ref 13.0–17.0)
Immature Granulocytes: 2 %
Lymphocytes Relative: 25 %
Lymphs Abs: 1.2 10*3/uL (ref 0.7–4.0)
MCH: 29.9 pg (ref 26.0–34.0)
MCHC: 34.5 g/dL (ref 30.0–36.0)
MCV: 86.6 fL (ref 80.0–100.0)
Monocytes Absolute: 0.7 10*3/uL (ref 0.1–1.0)
Monocytes Relative: 15 %
Neutro Abs: 2.5 10*3/uL (ref 1.7–7.7)
Neutrophils Relative %: 54 %
Platelet Count: 181 10*3/uL (ref 150–400)
RBC: 4.62 MIL/uL (ref 4.22–5.81)
RDW: 13.9 % (ref 11.5–15.5)
WBC Count: 4.7 10*3/uL (ref 4.0–10.5)
nRBC: 0 % (ref 0.0–0.2)

## 2023-07-17 MED ORDER — NYSTATIN 100000 UNIT/ML MT SUSP
5.0000 mL | Freq: Four times a day (QID) | OROMUCOSAL | 1 refills | Status: DC
  Filled 2023-07-17: qty 240, 12d supply, fill #0
  Filled 2023-07-31: qty 240, 12d supply, fill #1

## 2023-07-17 MED ORDER — PALONOSETRON HCL INJECTION 0.25 MG/5ML
0.2500 mg | Freq: Once | INTRAVENOUS | Status: AC
  Administered 2023-07-17: 0.25 mg via INTRAVENOUS
  Filled 2023-07-17: qty 5

## 2023-07-17 MED ORDER — SODIUM CHLORIDE 0.9 % IV SOLN
10.0000 mg | Freq: Once | INTRAVENOUS | Status: AC
  Administered 2023-07-17: 10 mg via INTRAVENOUS
  Filled 2023-07-17: qty 10

## 2023-07-17 MED ORDER — SODIUM CHLORIDE 0.9% FLUSH
10.0000 mL | INTRAVENOUS | Status: DC | PRN
  Administered 2023-07-17: 10 mL

## 2023-07-17 MED ORDER — SODIUM CHLORIDE 0.9 % IV SOLN
150.0000 mg | Freq: Once | INTRAVENOUS | Status: AC
  Administered 2023-07-17: 150 mg via INTRAVENOUS
  Filled 2023-07-17: qty 150

## 2023-07-17 MED ORDER — SODIUM CHLORIDE 0.9 % IV SOLN
400.0000 mg/m2 | Freq: Once | INTRAVENOUS | Status: AC
  Administered 2023-07-17: 752 mg via INTRAVENOUS
  Filled 2023-07-17: qty 37.6

## 2023-07-17 MED ORDER — ATROPINE SULFATE 1 MG/ML IV SOLN
0.5000 mg | Freq: Once | INTRAVENOUS | Status: AC | PRN
  Administered 2023-07-17: 0.5 mg via INTRAVENOUS
  Filled 2023-07-17: qty 1

## 2023-07-17 MED ORDER — DEXTROSE 5 % IV SOLN: Freq: Once | INTRAVENOUS | Status: AC

## 2023-07-17 MED ORDER — OXALIPLATIN CHEMO INJECTION 100 MG/20ML
85.0000 mg/m2 | Freq: Once | INTRAVENOUS | Status: AC
  Administered 2023-07-17: 150 mg via INTRAVENOUS
  Filled 2023-07-17: qty 20

## 2023-07-17 MED ORDER — SODIUM CHLORIDE 0.9 % IV SOLN
2400.0000 mg/m2 | INTRAVENOUS | Status: DC
  Administered 2023-07-17: 5000 mg via INTRAVENOUS
  Filled 2023-07-17: qty 100

## 2023-07-17 MED ORDER — SODIUM CHLORIDE 0.9 % IV SOLN
150.0000 mg/m2 | Freq: Once | INTRAVENOUS | Status: AC
  Administered 2023-07-17: 300 mg via INTRAVENOUS
  Filled 2023-07-17: qty 15

## 2023-07-17 NOTE — Progress Notes (Signed)
This encounter was created in error - please disregard.

## 2023-07-17 NOTE — Progress Notes (Signed)
57 yo male diagnosed with Pancreas cancer and followed by Dr. Truett Perna. He is s/p 2 cycles of FOLFIRINOX. Receives cycle 3 today.  PMH includes DM 2.  Medications include Compazine, MMW, Metformin, Reglan, Zofran.  Labs include Na 133, Glucose 249 and BUN 22.  Height: 5'8". Weight: 155 pounds 9.6 oz on July 31. UBW: ~ 160 pounds. BMI: 23.66.  Patient denies N and V. He does not have mouth ulcers or diarrhea. He did develop a sore on his gums. He has an increased appetite and is eating well. He eats at least 3 meals daily with generous portions and snacks between meals. He drinks water, Coke and Gatorade Zero. He has been drinking an Equate Max providing ~150 calories and 30 grams protein. Generally has [place] yogurt and 2 boiled eggs at breakfast. He may have grilled chicken with pinto beans, lima beans and Cornbread at lunch. He doesn't count CHO but does monitor his blood sugars and reports they have been high on treatment days due to steroids. He has an appointment with his MD who manages his DM.  Nutrition Diagnosis: Food and Nutrition Related Knowledge Deficit related to cancer and associated treatments as evidenced by no prior need for nutrition related information.  Intervention: Educated on NCS diet and importance of pairing complex CHO with protein at meals and snacks to improve glycemic control. Encouraged decreased concentrated sweets, esp around treatment. Encouraged increased protein foods, spread throughout the day to maximize absorption. Continue ONS as needed. Provided education on eating with sore mouth/gums. Fact sheets given. Questions answered. Contact information provided.  Monitoring, Evaluation, Goals: Patient will tolerate adequate calories and protein to minimize wt loss and improve glycemic control.  Next Visit: To be scheduled as needed.

## 2023-07-17 NOTE — Progress Notes (Signed)
Patient seen by Lonna Cobb NP today  Vitals are not all within treatment parameters. B/P 130/91 no intervention   Labs reviewed by Lonna Cobb NP and are within treatment parameters.  Per physician team, patient is ready for treatment and there are NO modifications to the treatment plan.

## 2023-07-17 NOTE — Patient Instructions (Signed)
[facility] [facility]  Discharge Instructions: Thank you for choosing [facility] to provide your oncology and hematology care.   If you have a lab appointment with the Cancer Center, please go directly to the Cancer Center and check in at the registration area.   Wear comfortable clothing and clothing appropriate for easy access to any Portacath or PICC line.   We strive to give you quality time with your provider. You may need to reschedule your appointment if you arrive late (15 or more minutes).  Arriving late affects you and other patients whose appointments are after yours.  Also, if you miss three or more appointments without notifying the office, you may be dismissed from the clinic at the provider's discretion.      For prescription refill requests, have your pharmacy contact our office and allow 72 hours for refills to be completed.    Today you received the following chemotherapy and/or immunotherapy agents: oxaliplatin, irinotecan, leucovorin, fluorouracil.       To help prevent nausea and vomiting after your treatment, we encourage you to take your nausea medication as directed.  BELOW ARE SYMPTOMS THAT SHOULD BE REPORTED IMMEDIATELY: *FEVER GREATER THAN 100.4 F (38 C) OR HIGHER *CHILLS OR SWEATING *NAUSEA AND VOMITING THAT IS NOT CONTROLLED WITH YOUR NAUSEA MEDICATION *UNUSUAL SHORTNESS OF BREATH *UNUSUAL BRUISING OR BLEEDING *URINARY PROBLEMS (pain or burning when urinating, or frequent urination) *BOWEL PROBLEMS (unusual diarrhea, constipation, pain near the anus) TENDERNESS IN MOUTH AND THROAT WITH OR WITHOUT PRESENCE OF ULCERS (sore throat, sores in mouth, or a toothache) UNUSUAL RASH, SWELLING OR PAIN  UNUSUAL VAGINAL DISCHARGE OR ITCHING   Items with * indicate a potential emergency and should be followed up as soon as possible or go to the Emergency Department if any problems should occur.  Please show the CHEMOTHERAPY ALERT  CARD or IMMUNOTHERAPY ALERT CARD at check-in to the Emergency Department and triage nurse.  Should you have questions after your visit or need to cancel or reschedule your appointment, please contact [place] [facility]  Dept: [phone]  and follow the prompts.  Office hours are 8:00 a.m. to 4:30 p.m. Monday - Friday. Please note that voicemails left after 4:00 p.m. may not be returned until the following business day.  We are closed weekends and major holidays. You have access to a nurse at all times for urgent questions. Please call the main number to the clinic Dept: [phone] and follow the prompts.   For any non-urgent questions, you may also contact your provider using MyChart. We now offer e-Visits for anyone 58 and older to request care online for non-urgent symptoms. For details visit [email or web address].   Also download the MyChart app! Go to the app store, search "MyChart", open the app, select [place], and log in with your MyChart username and password.  Oxaliplatin Injection What is this medication? OXALIPLATIN (ox AL i PLA tin) treats colorectal cancer. It works by slowing down the growth of cancer cells. This medicine may be used for other purposes; ask your health care provider or pharmacist if you have questions. COMMON BRAND NAME(S): Eloxatin What should I tell my care team before I take this medication? They need to know if you have any of these conditions: Heart disease History of irregular heartbeat or rhythm Liver disease Low blood cell levels (white cells, red cells, and platelets) Lung or breathing disease, such as asthma Take medications that treat or prevent  blood clots Tingling of the fingers, toes, or other nerve disorder An unusual or allergic reaction to oxaliplatin, other medications, foods, dyes, or preservatives If you or your partner are pregnant or trying to get pregnant Breast-feeding How should I use this  medication? This medication is injected into a vein. It is given by your care team in a hospital or clinic setting. Talk to your care team about the use of this medication in children. Special care may be needed. Overdosage: If you think you have taken too much of this medicine contact a poison control center or emergency room at once. NOTE: This medicine is only for you. Do not share this medicine with others. What if I miss a dose? Keep appointments for follow-up doses. It is important not to miss a dose. Call your care team if you are unable to keep an appointment. What may interact with this medication? Do not take this medication with any of the following: Cisapride Dronedarone Pimozide Thioridazine This medication may also interact with the following: Aspirin and aspirin-like medications Certain medications that treat or prevent blood clots, such as warfarin, apixaban, dabigatran, and rivaroxaban Cisplatin Cyclosporine Diuretics Medications for infection, such as acyclovir, adefovir, amphotericin B, bacitracin, cidofovir, foscarnet, ganciclovir, gentamicin, pentamidine, vancomycin NSAIDs, medications for pain and inflammation, such as ibuprofen or naproxen Other medications that cause heart rhythm changes Pamidronate Zoledronic acid This list may not describe all possible interactions. Give your health care provider a list of all the medicines, herbs, non-prescription drugs, or dietary supplements you use. Also tell them if you smoke, drink alcohol, or use illegal drugs. Some items may interact with your medicine. What should I watch for while using this medication? Your condition will be monitored carefully while you are receiving this medication. You may need blood work while taking this medication. This medication may make you feel generally unwell. This is not uncommon as chemotherapy can affect healthy cells as well as cancer cells. Report any side effects. Continue your course  of treatment even though you feel ill unless your care team tells you to stop. This medication may increase your risk of getting an infection. Call your care team for advice if you get a fever, chills, sore throat, or other symptoms of a cold or flu. Do not treat yourself. Try to avoid being around people who are sick. Avoid taking medications that contain aspirin, acetaminophen, ibuprofen, naproxen, or ketoprofen unless instructed by your care team. These medications may hide a fever. Be careful brushing or flossing your teeth or using a toothpick because you may get an infection or bleed more easily. If you have any dental work done, tell your dentist you are receiving this medication. This medication can make you more sensitive to cold. Do not drink cold drinks or use ice. Cover exposed skin before coming in contact with cold temperatures or cold objects. When out in cold weather wear warm clothing and cover your mouth and nose to warm the air that goes into your lungs. Tell your care team if you get sensitive to the cold. Talk to your care team if you or your partner are pregnant or think either of you might be pregnant. This medication can cause serious birth defects if taken during pregnancy and for 9 months after the last dose. A negative pregnancy test is required before starting this medication. A reliable form of contraception is recommended while taking this medication and for 9 months after the last dose. Talk to your care team about   effective forms of contraception. Do not father a child while taking this medication and for 6 months after the last dose. Use a condom while having sex during this time period. Do not breastfeed while taking this medication and for 3 months after the last dose. This medication may cause infertility. Talk to your care team if you are concerned about your fertility. What side effects may I notice from receiving this medication? Side effects that you should report to  your care team as soon as possible: Allergic reactions--skin rash, itching, hives, swelling of the face, lips, tongue, or throat Bleeding--bloody or black, tar-like stools, vomiting blood or brown material that looks like coffee grounds, red or dark brown urine, small red or purple spots on skin, unusual bruising or bleeding Dry cough, shortness of breath or trouble breathing Heart rhythm changes--fast or irregular heartbeat, dizziness, feeling faint or lightheaded, chest pain, trouble breathing Infection--fever, chills, cough, sore throat, wounds that don't heal, pain or trouble when passing urine, general feeling of discomfort or being unwell Liver injury--right upper belly pain, loss of appetite, nausea, light-colored stool, dark yellow or brown urine, yellowing skin or eyes, unusual weakness or fatigue Low red blood cell level--unusual weakness or fatigue, dizziness, headache, trouble breathing Muscle injury--unusual weakness or fatigue, muscle pain, dark yellow or brown urine, decrease in amount of urine Pain, tingling, or numbness in the hands or feet Sudden and severe headache, confusion, change in vision, seizures, which may be signs of posterior reversible encephalopathy syndrome (PRES) Unusual bruising or bleeding Side effects that usually do not require medical attention (report to your care team if they continue or are bothersome): Diarrhea Nausea Pain, redness, or swelling with sores inside the mouth or throat Unusual weakness or fatigue Vomiting This list may not describe all possible side effects. Call your doctor for medical advice about side effects. You may report side effects to FDA at 1-800-FDA-1088. Where should I keep my medication? This medication is given in a hospital or clinic. It will not be stored at home. NOTE: This sheet is a summary. It may not cover all possible information. If you have questions about this medicine, talk to your doctor, pharmacist, or health care  provider.  2024 Elsevier/Gold Standard (2023-02-10 00:00:00) Irinotecan Injection What is this medication? IRINOTECAN (ir in oh TEE kan) treats some types of cancer. It works by slowing down the growth of cancer cells. This medicine may be used for other purposes; ask your health care provider or pharmacist if you have questions. COMMON BRAND NAME(S): Camptosar What should I tell my care team before I take this medication? They need to know if you have any of these conditions: Dehydration Diarrhea Infection, especially a viral infection, such as chickenpox, cold sores, herpes Liver disease Low blood cell levels (white cells, red cells, and platelets) Low levels of electrolytes, such as calcium, magnesium, or potassium in your blood Recent or ongoing radiation An unusual or allergic reaction to irinotecan, other medications, foods, dyes, or preservatives If you or your partner are pregnant or trying to get pregnant Breast-feeding How should I use this medication? This medication is injected into a vein. It is given by your care team in a hospital or clinic setting. Talk to your care team about the use of this medication in children. Special care may be needed. Overdosage: If you think you have taken too much of this medicine contact a poison control center or emergency room at once. NOTE: This medicine is only for you. Do  not share this medicine with others. What if I miss a dose? Keep appointments for follow-up doses. It is important not to miss your dose. Call your care team if you are unable to keep an appointment. What may interact with this medication? Do not take this medication with any of the following: Cobicistat Itraconazole This medication may also interact with the following: Certain antibiotics, such as clarithromycin, rifampin, rifabutin Certain antivirals for HIV or AIDS Certain medications for fungal infections, such as ketoconazole, posaconazole,  voriconazole Certain medications for seizures, such as carbamazepine, phenobarbital, phenytoin Gemfibrozil Nefazodone St. John's wort This list may not describe all possible interactions. Give your health care provider a list of all the medicines, herbs, non-prescription drugs, or dietary supplements you use. Also tell them if you smoke, drink alcohol, or use illegal drugs. Some items may interact with your medicine. What should I watch for while using this medication? Your condition will be monitored carefully while you are receiving this medication. You may need blood work while taking this medication. This medication may make you feel generally unwell. This is not uncommon as chemotherapy can affect healthy cells as well as cancer cells. Report any side effects. Continue your course of treatment even though you feel ill unless your care team tells you to stop. This medication can cause serious side effects. To reduce the risk, your care team may give you other medications to take before receiving this one. Be sure to follow the directions from your care team. This medication may affect your coordination, reaction time, or judgement. Do not drive or operate machinery until you know how this medication affects you. Sit up or stand slowly to reduce the risk of dizzy or fainting spells. Drinking alcohol with this medication can increase the risk of these side effects. This medication may increase your risk of getting an infection. Call your care team for advice if you get a fever, chills, sore throat, or other symptoms of a cold or flu. Do not treat yourself. Try to avoid being around people who are sick. Avoid taking medications that contain aspirin, acetaminophen, ibuprofen, naproxen, or ketoprofen unless instructed by your care team. These medications may hide a fever. This medication may increase your risk to bruise or bleed. Call your care team if you notice any unusual bleeding. Be careful  brushing or flossing your teeth or using a toothpick because you may get an infection or bleed more easily. If you have any dental work done, tell your dentist you are receiving this medication. Talk to your care team if you or your partner are pregnant or think either of you might be pregnant. This medication can cause serious birth defects if taken during pregnancy and for 6 months after the last dose. You will need a negative pregnancy test before starting this medication. Contraception is recommended while taking this medication and for 6 months after the last dose. Your care team can help you find the option that works for you. Do not father a child while taking this medication and for 3 months after the last dose. Use a condom for contraception during this time period. Do not breastfeed while taking this medication and for 7 days after the last dose. This medication may cause infertility. Talk to your care team if you are concerned about your fertility. What side effects may I notice from receiving this medication? Side effects that you should report to your care team as soon as possible: Allergic reactions--skin rash, itching, hives, swelling of  the face, lips, tongue, or throat Dry cough, shortness of breath or trouble breathing Increased saliva or tears, increased sweating, stomach cramping, diarrhea, small pupils, unusual weakness or fatigue, slow heartbeat Infection--fever, chills, cough, sore throat, wounds that don't heal, pain or trouble when passing urine, general feeling of discomfort or being unwell Kidney injury--decrease in the amount of urine, swelling of the ankles, hands, or feet Low red blood cell level--unusual weakness or fatigue, dizziness, headache, trouble breathing Severe or prolonged diarrhea Unusual bruising or bleeding Side effects that usually do not require medical attention (report to your care team if they continue or are bothersome): Constipation Diarrhea Hair  loss Loss of appetite Nausea Stomach pain This list may not describe all possible side effects. Call your doctor for medical advice about side effects. You may report side effects to FDA at 1-800-FDA-1088. Where should I keep my medication? This medication is given in a hospital or clinic. It will not be stored at home. NOTE: This sheet is a summary. It may not cover all possible information. If you have questions about this medicine, talk to your doctor, pharmacist, or health care provider.  2024 Elsevier/Gold Standard (2022-04-16 00:00:00) Leucovorin Injection What is this medication? LEUCOVORIN (loo koe VOR in) prevents side effects from certain medications, such as methotrexate. It works by increasing folate levels. This helps protect healthy cells in your body. It may also be used to treat anemia caused by low levels of folate. It can also be used with fluorouracil, a type of chemotherapy, to treat colorectal cancer. It works by increasing the effects of fluorouracil in the body. This medicine may be used for other purposes; ask your health care provider or pharmacist if you have questions. What should I tell my care team before I take this medication? They need to know if you have any of these conditions: Anemia from low levels of vitamin B12 in the blood An unusual or allergic reaction to leucovorin, folic acid, other medications, foods, dyes, or preservatives Pregnant or trying to get pregnant Breastfeeding How should I use this medication? This medication is injected into a vein or a muscle. It is given by your care team in a hospital or clinic setting. Talk to your care team about the use of this medication in children. Special care may be needed. Overdosage: If you think you have taken too much of this medicine contact a poison control center or emergency room at once. NOTE: This medicine is only for you. Do not share this medicine with others. What if I miss a dose? Keep  appointments for follow-up doses. It is important not to miss your dose. Call your care team if you are unable to keep an appointment. What may interact with this medication? Capecitabine Fluorouracil Phenobarbital Phenytoin Primidone Trimethoprim;sulfamethoxazole This list may not describe all possible interactions. Give your health care provider a list of all the medicines, herbs, non-prescription drugs, or dietary supplements you use. Also tell them if you smoke, drink alcohol, or use illegal drugs. Some items may interact with your medicine. What should I watch for while using this medication? Your condition will be monitored carefully while you are receiving this medication. This medication may increase the side effects of 5-fluorouracil. Tell your care team if you have diarrhea or mouth sores that do not get better or that get worse. What side effects may I notice from receiving this medication? Side effects that you should report to your care team as soon as possible: Allergic reactions--skin rash,  itching, hives, swelling of the face, lips, tongue, or throat This list may not describe all possible side effects. Call your doctor for medical advice about side effects. You may report side effects to FDA at 1-800-FDA-1088. Where should I keep my medication? This medication is given in a hospital or clinic. It will not be stored at home. NOTE: This sheet is a summary. It may not cover all possible information. If you have questions about this medicine, talk to your doctor, pharmacist, or health care provider.  2024 Elsevier/Gold Standard (2022-05-08 00:00:00) Fluorouracil Injection What is this medication? FLUOROURACIL (flure oh YOOR a sil) treats some types of cancer. It works by slowing down the growth of cancer cells. This medicine may be used for other purposes; ask your health care provider or pharmacist if you have questions. COMMON BRAND NAME(S): Adrucil What should I tell my care  team before I take this medication? They need to know if you have any of these conditions: Blood disorders Dihydropyrimidine dehydrogenase (DPD) deficiency Infection, such as chickenpox, cold sores, herpes Kidney disease Liver disease Poor nutrition Recent or ongoing radiation therapy An unusual or allergic reaction to fluorouracil, other medications, foods, dyes, or preservatives If you or your partner are pregnant or trying to get pregnant Breast-feeding How should I use this medication? This medication is injected into a vein. It is administered by your care team in a hospital or clinic setting. Talk to your care team about the use of this medication in children. Special care may be needed. Overdosage: If you think you have taken too much of this medicine contact a poison control center or emergency room at once. NOTE: This medicine is only for you. Do not share this medicine with others. What if I miss a dose? Keep appointments for follow-up doses. It is important not to miss your dose. Call your care team if you are unable to keep an appointment. What may interact with this medication? Do not take this medication with any of the following: Live virus vaccines This medication may also interact with the following: Medications that treat or prevent blood clots, such as warfarin, enoxaparin, dalteparin This list may not describe all possible interactions. Give your health care provider a list of all the medicines, herbs, non-prescription drugs, or dietary supplements you use. Also tell them if you smoke, drink alcohol, or use illegal drugs. Some items may interact with your medicine. What should I watch for while using this medication? Your condition will be monitored carefully while you are receiving this medication. This medication may make you feel generally unwell. This is not uncommon as chemotherapy can affect healthy cells as well as cancer cells. Report any side effects. Continue  your course of treatment even though you feel ill unless your care team tells you to stop. In some cases, you may be given additional medications to help with side effects. Follow all directions for their use. This medication may increase your risk of getting an infection. Call your care team for advice if you get a fever, chills, sore throat, or other symptoms of a cold or flu. Do not treat yourself. Try to avoid being around people who are sick. This medication may increase your risk to bruise or bleed. Call your care team if you notice any unusual bleeding. Be careful brushing or flossing your teeth or using a toothpick because you may get an infection or bleed more easily. If you have any dental work done, tell your dentist you are receiving  this medication. Avoid taking medications that contain aspirin, acetaminophen, ibuprofen, naproxen, or ketoprofen unless instructed by your care team. These medications may hide a fever. Do not treat diarrhea with over the counter products. Contact your care team if you have diarrhea that lasts more than 2 days or if it is severe and watery. This medication can make you more sensitive to the sun. Keep out of the sun. If you cannot avoid being in the sun, wear protective clothing and sunscreen. Do not use sun lamps, tanning beds, or tanning booths. Talk to your care team if you or your partner wish to become pregnant or think you might be pregnant. This medication can cause serious birth defects if taken during pregnancy and for 3 months after the last dose. A reliable form of contraception is recommended while taking this medication and for 3 months after the last dose. Talk to your care team about effective forms of contraception. Do not father a child while taking this medication and for 3 months after the last dose. Use a condom while having sex during this time period. Do not breastfeed while taking this medication. This medication may cause infertility. Talk  to your care team if you are concerned about your fertility. What side effects may I notice from receiving this medication? Side effects that you should report to your care team as soon as possible: Allergic reactions--skin rash, itching, hives, swelling of the face, lips, tongue, or throat Heart attack--pain or tightness in the chest, shoulders, arms, or jaw, nausea, shortness of breath, cold or clammy skin, feeling faint or lightheaded Heart failure--shortness of breath, swelling of the ankles, feet, or hands, sudden weight gain, unusual weakness or fatigue Heart rhythm changes--fast or irregular heartbeat, dizziness, feeling faint or lightheaded, chest pain, trouble breathing High ammonia level--unusual weakness or fatigue, confusion, loss of appetite, nausea, vomiting, seizures Infection--fever, chills, cough, sore throat, wounds that don't heal, pain or trouble when passing urine, general feeling of discomfort or being unwell Low red blood cell level--unusual weakness or fatigue, dizziness, headache, trouble breathing Pain, tingling, or numbness in the hands or feet, muscle weakness, change in vision, confusion or trouble speaking, loss of balance or coordination, trouble walking, seizures Redness, swelling, and blistering of the skin over hands and feet Severe or prolonged diarrhea Unusual bruising or bleeding Side effects that usually do not require medical attention (report to your care team if they continue or are bothersome): Dry skin Headache Increased tears Nausea Pain, redness, or swelling with sores inside the mouth or throat Sensitivity to light Vomiting This list may not describe all possible side effects. Call your doctor for medical advice about side effects. You may report side effects to FDA at 1-800-FDA-1088. Where should I keep my medication? This medication is given in a hospital or clinic. It will not be stored at home. NOTE: This sheet is a summary. It may not cover  all possible information. If you have questions about this medicine, talk to your doctor, pharmacist, or health care provider.  2024 Elsevier/Gold Standard (2022-04-10 00:00:00)

## 2023-07-17 NOTE — Progress Notes (Signed)
  [facility] OFFICE PROGRESS NOTE   Diagnosis: Pancreas cancer  INTERVAL HISTORY:   Mr. Dillow returns as scheduled.  He completed cycle 2 FOLFIRINOX 07/03/2023.  He denies nausea/vomiting.  A few areas of soreness on the upper and lower gumline, no ulcers.  No diarrhea.  Cold sensitivity lasted a few days.  No persistent neuropathy symptoms.  No significant hiccups following treatment.  Objective:  Vital signs in last 24 hours:  Blood pressure (!) 130/91, pulse 96, temperature 98.2 F (36.8 C), temperature source Oral, resp. rate 18, height 5\' 8"  (1.727 m), weight 155 lb 9.6 oz (70.6 kg), SpO2 100%.    HEENT: No thrush or ulcers. Resp: Lungs clear bilaterally. Cardio: Regular rate and rhythm. GI: No hepatosplenomegaly. Vascular: No leg edema. Skin: Palms without erythema. Port-A-Cath without erythema.  Lab Results:  Lab Results  Component Value Date   WBC 4.7 07/17/2023   HGB 13.8 07/17/2023   HCT 40.0 07/17/2023   MCV 86.6 07/17/2023   PLT 181 07/17/2023   NEUTROABS 2.5 07/17/2023    Imaging:  No results found.  Medications: I have reviewed the patient's current medications.  Assessment/Plan: Pancreas cancer CT angiogram chest 04/24/2023-low-density mass in the dome of the liver with at least 3 additional lesions in both hepatic lobes, borderline lymphadenopathy in the hepatoduodenal ligament Right upper quadrant ultrasound 04/24/2023-large circumscribed mass of the liver dome CT abdomen/pelvis 04/24/2023-multiple liver lesions poorly defined on noncontrast exam, prominent portacaval and porta hepatis nodes MRI abdomen 05/05/2023-multiple rim-hypoenhancing liver lesions consistent with hepatic metastases, suspicion for a pancreas tail mass Ultrasound-guided biopsy of dominant right liver lesion 05/10/2023-adenocarcinoma, CK7 and CDX2 positive, abundant cytoplasm and mucin with extracellular mucin, foci of lymphovascular invasion Tempus gene panel-K-ras G12V,  T P53 mild tumor mutation burden 3.2, MSS PET 06/04/2023-multiple hypermetabolic liver lesions consistent with metastases, low-level FDG uptake in the soft tissue fullness at the tip of the pancreas tail, low-level FDG activity involving a small soft tissue nodule between the gastric fundus and spleen, tiny foci of accumulation at the right costovertebral junction at T10 and roof of the left acetabulum without an underlying CT lesion Evaded CEA and CA 19-9 EUS six 2124-22 x 15 mm irregular mass in the tail the pancreas, 11 mm subcarinal lymph node, FNA biopsy of the pancreas mass-suspicious for malignancy Cycle 1 FOLFIRINOX 06/19/2023 Cycle 2 FOLFIRINOX 07/03/2023 Cycle 3 FOLFIRINOX 07/17/2023 Family history of pancreas cancerINVITAE panel 05/17/2023-NF1 VUS Severe hiccups following cycle 1 FOLFIRINOX, did not respond to baclofen or gabapentin.  Resolved with Reglan.  Disposition: Mr. Kollars appears stable.  He has completed 2 cycles of FOLFIRINOX.  He tolerated cycle 2 well, no significant issues with hiccups.  Plan to proceed with cycle 3 today as scheduled.  CBC and chemistry panel reviewed.  Labs adequate to proceed as above.  He will return for follow-up and treatment in 2 weeks.  We are available to see him sooner if needed.    Lonna Cobb ANP/GNP-BC   07/17/2023  9:59 AM

## 2023-07-17 NOTE — Patient Instructions (Signed)

## 2023-07-18 ENCOUNTER — Encounter: Payer: Self-pay | Admitting: Hematology

## 2023-07-18 ENCOUNTER — Other Ambulatory Visit: Payer: Self-pay

## 2023-07-19 ENCOUNTER — Inpatient Hospital Stay: Payer: 59 | Attending: Hematology

## 2023-07-19 ENCOUNTER — Encounter: Payer: Self-pay | Admitting: Hematology

## 2023-07-19 VITALS — BP 130/78 | HR 108 | Temp 98.0°F | Resp 20

## 2023-07-19 DIAGNOSIS — C787 Secondary malignant neoplasm of liver and intrahepatic bile duct: Secondary | ICD-10-CM | POA: Diagnosis present

## 2023-07-19 DIAGNOSIS — C252 Malignant neoplasm of tail of pancreas: Secondary | ICD-10-CM | POA: Insufficient documentation

## 2023-07-19 DIAGNOSIS — Z5189 Encounter for other specified aftercare: Secondary | ICD-10-CM | POA: Insufficient documentation

## 2023-07-19 DIAGNOSIS — C259 Malignant neoplasm of pancreas, unspecified: Secondary | ICD-10-CM

## 2023-07-19 DIAGNOSIS — Z5111 Encounter for antineoplastic chemotherapy: Secondary | ICD-10-CM | POA: Diagnosis present

## 2023-07-19 MED ORDER — PEGFILGRASTIM-JMDB 6 MG/0.6ML ~~LOC~~ SOSY
6.0000 mg | PREFILLED_SYRINGE | Freq: Once | SUBCUTANEOUS | Status: AC
  Administered 2023-07-19: 6 mg via SUBCUTANEOUS
  Filled 2023-07-19: qty 0.6

## 2023-07-19 MED ORDER — PEGFILGRASTIM-CBQV 6 MG/0.6ML ~~LOC~~ SOSY: 6.0000 mg | PREFILLED_SYRINGE | Freq: Once | SUBCUTANEOUS | Status: DC

## 2023-07-19 MED ORDER — SODIUM CHLORIDE 0.9% FLUSH
10.0000 mL | INTRAVENOUS | Status: DC | PRN
  Administered 2023-07-19: 10 mL

## 2023-07-19 MED ORDER — HEPARIN SOD (PORK) LOCK FLUSH 100 UNIT/ML IV SOLN
500.0000 [IU] | Freq: Once | INTRAVENOUS | Status: AC | PRN
  Administered 2023-07-19: 500 [IU]

## 2023-07-19 NOTE — Patient Instructions (Signed)
Pegfilgrastim Injection What is this medication? PEGFILGRASTIM (PEG fil gra stim) lowers the risk of infection in people who are receiving chemotherapy. It works by Systems analyst make more white blood cells, which protects your body from infection. It may also be used to help people who have been exposed to high doses of radiation. This medicine may be used for other purposes; ask your health care provider or pharmacist if you have questions. COMMON BRAND NAME(S): Cherly Hensen, Neulasta, Nyvepria, Stimufend, UDENYCA, UDENYCA ONBODY, Ziextenzo What should I tell my care team before I take this medication? They need to know if you have any of these conditions: Kidney disease Latex allergy Ongoing radiation therapy Sickle cell disease Skin reactions to acrylic adhesives (On-Body Injector only) An unusual or allergic reaction to pegfilgrastim, filgrastim, other medications, foods, dyes, or preservatives Pregnant or trying to get pregnant Breast-feeding How should I use this medication? This medication is for injection under the skin. If you get this medication at home, you will be taught how to prepare and give the pre-filled syringe or how to use the On-body Injector. Refer to the patient Instructions for Use for detailed instructions. Use exactly as directed. Tell your care team immediately if you suspect that the On-body Injector may not have performed as intended or if you suspect the use of the On-body Injector resulted in a missed or partial dose. It is important that you put your used needles and syringes in a special sharps container. Do not put them in a trash can. If you do not have a sharps container, call your pharmacist or care team to get one. Talk to your care team about the use of this medication in children. While this medication may be prescribed for selected conditions, precautions do apply. Overdosage: If you think you have taken too much of this medicine contact a poison  control center or emergency room at once. NOTE: This medicine is only for you. Do not share this medicine with others. What if I miss a dose? It is important not to miss your dose. Call your care team if you miss your dose. If you miss a dose due to an On-body Injector failure or leakage, a new dose should be administered as soon as possible using a single prefilled syringe for manual use. What may interact with this medication? Interactions have not been studied. This list may not describe all possible interactions. Give your health care provider a list of all the medicines, herbs, non-prescription drugs, or dietary supplements you use. Also tell them if you smoke, drink alcohol, or use illegal drugs. Some items may interact with your medicine. What should I watch for while using this medication? Your condition will be monitored carefully while you are receiving this medication. You may need blood work done while you are taking this medication. Talk to your care team about your risk of cancer. You may be more at risk for certain types of cancer if you take this medication. If you are going to need a MRI, CT scan, or other procedure, tell your care team that you are using this medication (On-Body Injector only). What side effects may I notice from receiving this medication? Side effects that you should report to your care team as soon as possible: Allergic reactions--skin rash, itching, hives, swelling of the face, lips, tongue, or throat Capillary leak syndrome--stomach or muscle pain, unusual weakness or fatigue, feeling faint or lightheaded, decrease in the amount of urine, swelling of the ankles, hands, or  feet, trouble breathing High white blood cell level--fever, fatigue, trouble breathing, night sweats, change in vision, weight loss Inflammation of the aorta--fever, fatigue, back, chest, or stomach pain, severe headache Kidney injury (glomerulonephritis)--decrease in the amount of urine, red  or dark brown urine, foamy or bubbly urine, swelling of the ankles, hands, or feet Shortness of breath or trouble breathing Spleen injury--pain in upper left stomach or shoulder Unusual bruising or bleeding Side effects that usually do not require medical attention (report to your care team if they continue or are bothersome): Bone pain Pain in the hands or feet This list may not describe all possible side effects. Call your doctor for medical advice about side effects. You may report side effects to FDA at 1-800-FDA-1088. Where should I keep my medication? Keep out of the reach of children. If you are using this medication at home, you will be instructed on how to store it. Throw away any unused medication after the expiration date on the label. NOTE: This sheet is a summary. It may not cover all possible information. If you have questions about this medicine, talk to your doctor, pharmacist, or health care provider.  2024 Elsevier/Gold Standard (2021-11-03 00:00:00)  Implanted [facility] Guide An implanted port is a device that is placed under the skin. It is usually placed in the chest. The device may vary based on the need. Implanted ports can be used to give IV medicine, to take blood, or to give fluids. You may have an implanted port if: You need IV medicine that would be irritating to the small veins in your hands or arms. You need IV medicines, such as chemotherapy, for a long period of time. You need IV nutrition for a long period of time. You may have fewer limitations when using a port than you would if you used other types of long-term IVs. You will also likely be able to return to normal activities after your incision heals. An implanted port has two main parts: Reservoir. The reservoir is the part where a needle is inserted to give medicines or draw blood. The reservoir is round. After the port is placed, it appears as a small, raised area under your skin. Catheter. The catheter  is a small, thin tube that connects the reservoir to a vein. Medicine that is inserted into the reservoir goes into the catheter and then into the vein. How is my port accessed? To access your port: A numbing cream may be placed on the skin over the port site. Your health care provider will put on a mask and sterile gloves. The skin over your port will be cleaned carefully with a germ-killing soap and allowed to dry. Your health care provider will gently pinch the port and insert a needle into it. Your health care provider will check for a blood return to make sure the port is in the vein and is still working (patent). If your port needs to remain accessed to get medicine continuously (constant infusion), your health care provider will place a clear bandage (dressing) over the needle site. The dressing and needle will need to be changed every week, or as told by your health care provider. What is flushing? Flushing helps keep the port working. Follow instructions from your health care provider about how and when to flush the port. Ports are usually flushed with saline solution or a medicine called heparin. The need for flushing will depend on how the port is used: If the port is only used  from time to time to give medicines or draw blood, the port may need to be flushed: Before and after medicines have been given. Before and after blood has been drawn. As part of routine maintenance. Flushing may be recommended every 4-6 weeks. If a constant infusion is running, the port may not need to be flushed. Throw away any syringes in a disposal container that is meant for sharp items (sharps container). You can buy a sharps container from a pharmacy, or you can make one by using an empty hard plastic bottle with a cover. How long will my port stay implanted? The port can stay in for as long as your health care provider thinks it is needed. When it is time for the port to come out, a surgery will be done to  remove it. The surgery will be similar to the procedure that was done to put the port in. Follow these instructions at home: Caring for your port and port site Flush your port as told by your health care provider. If you need an infusion over several days, follow instructions from your health care provider about how to take care of your port site. Make sure you: Change your dressing as told by your health care provider. Wash your hands with soap and water for at least 20 seconds before and after you change your dressing. If soap and water are not available, use alcohol-based hand sanitizer. Place any used dressings or infusion bags into a plastic bag. Throw that bag in the trash. Keep the dressing that covers the needle clean and dry. Do not get it wet. Do not use scissors or sharp objects near the infusion tubing. Keep any external tubes clamped, unless they are being used. Check your port site every day for signs of infection. Check for: Redness, swelling, or pain. Fluid or blood. Warmth. Pus or a bad smell. Protect the skin around the port site. Avoid wearing bra straps that rub or irritate the site. Protect the skin around your port from seat belts. Place a soft pad over your chest if needed. Bathe or shower as told by your health care provider. The site may get wet as long as you are not actively receiving an infusion. General instructions  Return to your normal activities as told by your health care provider. Ask your health care provider what activities are safe for you. Carry a medical alert card or wear a medical alert bracelet at all times. This will let health care providers know that you have an implanted port in case of an emergency. Where to find more information American Cancer Society: www.cancer.org American Society of Clinical Oncology: www.cancer.net Contact a health care provider if: You have a fever or chills. You have redness, swelling, or pain at the port  site. You have fluid or blood coming from your port site. Your incision feels warm to the touch. You have pus or a bad smell coming from the port site. Summary Implanted ports are usually placed in the chest for long-term IV access. Follow instructions from your health care provider about flushing the port and changing bandages (dressings). Take care of the area around your port by avoiding clothing that puts pressure on the area, and by watching for signs of infection. Protect the skin around your port from seat belts. Place a soft pad over your chest if needed. Contact a health care provider if you have a fever or you have redness, swelling, pain, fluid, or a bad smell at  the port site. This information is not intended to replace advice given to you by your health care provider. Make sure you discuss any questions you have with your health care provider. Document Revised: 06/06/2021 Document Reviewed: 06/06/2021 Elsevier Patient Education  2024 [employer].

## 2023-07-22 ENCOUNTER — Encounter: Payer: Self-pay | Admitting: Nurse Practitioner

## 2023-07-31 ENCOUNTER — Inpatient Hospital Stay (HOSPITAL_BASED_OUTPATIENT_CLINIC_OR_DEPARTMENT_OTHER): Payer: 59 | Admitting: Nurse Practitioner

## 2023-07-31 ENCOUNTER — Inpatient Hospital Stay: Payer: 59

## 2023-07-31 ENCOUNTER — Encounter: Payer: Self-pay | Admitting: Nurse Practitioner

## 2023-07-31 ENCOUNTER — Other Ambulatory Visit (HOSPITAL_BASED_OUTPATIENT_CLINIC_OR_DEPARTMENT_OTHER): Payer: Self-pay

## 2023-07-31 VITALS — BP 143/89 | HR 89 | Temp 98.2°F | Resp 18 | Ht 68.0 in | Wt 159.3 lb

## 2023-07-31 DIAGNOSIS — C259 Malignant neoplasm of pancreas, unspecified: Secondary | ICD-10-CM

## 2023-07-31 DIAGNOSIS — C787 Secondary malignant neoplasm of liver and intrahepatic bile duct: Secondary | ICD-10-CM | POA: Diagnosis not present

## 2023-07-31 DIAGNOSIS — C252 Malignant neoplasm of tail of pancreas: Secondary | ICD-10-CM | POA: Diagnosis not present

## 2023-07-31 LAB — CBC WITH DIFFERENTIAL (CANCER CENTER ONLY)
Abs Immature Granulocytes: 0.1 10*3/uL — ABNORMAL HIGH (ref 0.00–0.07)
Basophils Absolute: 0 10*3/uL (ref 0.0–0.1)
Basophils Relative: 1 %
Eosinophils Absolute: 0.2 10*3/uL (ref 0.0–0.5)
Eosinophils Relative: 4 %
HCT: 36.9 % — ABNORMAL LOW (ref 39.0–52.0)
Hemoglobin: 12.5 g/dL — ABNORMAL LOW (ref 13.0–17.0)
Immature Granulocytes: 2 %
Lymphocytes Relative: 26 %
Lymphs Abs: 1.1 10*3/uL (ref 0.7–4.0)
MCH: 29.8 pg (ref 26.0–34.0)
MCHC: 33.9 g/dL (ref 30.0–36.0)
MCV: 88.1 fL (ref 80.0–100.0)
Monocytes Absolute: 0.5 10*3/uL (ref 0.1–1.0)
Monocytes Relative: 12 %
Neutro Abs: 2.5 10*3/uL (ref 1.7–7.7)
Neutrophils Relative %: 55 %
Platelet Count: 140 10*3/uL — ABNORMAL LOW (ref 150–400)
RBC: 4.19 MIL/uL — ABNORMAL LOW (ref 4.22–5.81)
RDW: 15.1 % (ref 11.5–15.5)
WBC Count: 4.4 10*3/uL (ref 4.0–10.5)
nRBC: 0 % (ref 0.0–0.2)

## 2023-07-31 LAB — CMP (CANCER CENTER ONLY)
ALT: 24 U/L (ref 0–44)
AST: 19 U/L (ref 15–41)
Albumin: 3.9 g/dL (ref 3.5–5.0)
Alkaline Phosphatase: 139 U/L — ABNORMAL HIGH (ref 38–126)
Anion gap: 9 (ref 5–15)
BUN: 18 mg/dL (ref 6–20)
CO2: 26 mmol/L (ref 22–32)
Calcium: 9.4 mg/dL (ref 8.9–10.3)
Chloride: 101 mmol/L (ref 98–111)
Creatinine: 0.86 mg/dL (ref 0.61–1.24)
GFR, Estimated: >60 mL/min (ref >60)
Glucose, Bld: 254 mg/dL — ABNORMAL HIGH (ref 70–99)
Potassium: 4 mmol/L (ref 3.5–5.1)
Sodium: 136 mmol/L (ref 135–145)
Total Bilirubin: 0.3 mg/dL (ref 0.3–1.2)
Total Protein: 6.9 g/dL (ref 6.5–8.1)

## 2023-07-31 MED ORDER — OXALIPLATIN CHEMO INJECTION 100 MG/20ML
85.0000 mg/m2 | Freq: Once | INTRAVENOUS | Status: AC
  Administered 2023-07-31: 150 mg via INTRAVENOUS
  Filled 2023-07-31: qty 20

## 2023-07-31 MED ORDER — PALONOSETRON HCL INJECTION 0.25 MG/5ML
0.2500 mg | Freq: Once | INTRAVENOUS | Status: AC
  Administered 2023-07-31: 0.25 mg via INTRAVENOUS
  Filled 2023-07-31: qty 5

## 2023-07-31 MED ORDER — SODIUM CHLORIDE 0.9 % IV SOLN
10.0000 mg | Freq: Once | INTRAVENOUS | Status: AC
  Administered 2023-07-31: 10 mg via INTRAVENOUS
  Filled 2023-07-31: qty 10

## 2023-07-31 MED ORDER — SODIUM CHLORIDE 0.9 % IV SOLN
150.0000 mg | Freq: Once | INTRAVENOUS | Status: AC
  Administered 2023-07-31: 150 mg via INTRAVENOUS
  Filled 2023-07-31: qty 150

## 2023-07-31 MED ORDER — DEXTROSE 5 % IV SOLN: Freq: Once | INTRAVENOUS | Status: AC

## 2023-07-31 MED ORDER — SODIUM CHLORIDE 0.9% FLUSH
10.0000 mL | INTRAVENOUS | Status: DC | PRN
  Administered 2023-07-31: 10 mL

## 2023-07-31 MED ORDER — ATROPINE SULFATE 1 MG/ML IV SOLN
0.5000 mg | Freq: Once | INTRAVENOUS | Status: AC | PRN
  Administered 2023-07-31: 0.5 mg via INTRAVENOUS
  Filled 2023-07-31: qty 1

## 2023-07-31 MED ORDER — SODIUM CHLORIDE 0.9 % IV SOLN
150.0000 mg/m2 | Freq: Once | INTRAVENOUS | Status: AC
  Administered 2023-07-31: 300 mg via INTRAVENOUS
  Filled 2023-07-31: qty 15

## 2023-07-31 MED ORDER — SODIUM CHLORIDE 0.9 % IV SOLN
2400.0000 mg/m2 | INTRAVENOUS | Status: DC
  Administered 2023-07-31: 5000 mg via INTRAVENOUS
  Filled 2023-07-31: qty 100

## 2023-07-31 MED ORDER — SODIUM CHLORIDE 0.9 % IV SOLN
400.0000 mg/m2 | Freq: Once | INTRAVENOUS | Status: AC
  Administered 2023-07-31: 752 mg via INTRAVENOUS
  Filled 2023-07-31: qty 37.6

## 2023-07-31 NOTE — Patient Instructions (Signed)

## 2023-07-31 NOTE — Patient Instructions (Signed)
[facility] [facility]  Discharge Instructions: Thank you for choosing [facility] to provide your oncology and hematology care.   If you have a lab appointment with the Cancer Center, please go directly to the Cancer Center and check in at the registration area.   Wear comfortable clothing and clothing appropriate for easy access to any Portacath or PICC line.   We strive to give you quality time with your provider. You may need to reschedule your appointment if you arrive late (15 or more minutes).  Arriving late affects you and other patients whose appointments are after yours.  Also, if you miss three or more appointments without notifying the office, you may be dismissed from the clinic at the provider's discretion.      For prescription refill requests, have your pharmacy contact our office and allow 72 hours for refills to be completed.    Today you received the following chemotherapy and/or immunotherapy agents: oxaliplatin, irinotecan, leucovorin, fluorouracil.       To help prevent nausea and vomiting after your treatment, we encourage you to take your nausea medication as directed.  BELOW ARE SYMPTOMS THAT SHOULD BE REPORTED IMMEDIATELY: *FEVER GREATER THAN 100.4 F (38 C) OR HIGHER *CHILLS OR SWEATING *NAUSEA AND VOMITING THAT IS NOT CONTROLLED WITH YOUR NAUSEA MEDICATION *UNUSUAL SHORTNESS OF BREATH *UNUSUAL BRUISING OR BLEEDING *URINARY PROBLEMS (pain or burning when urinating, or frequent urination) *BOWEL PROBLEMS (unusual diarrhea, constipation, pain near the anus) TENDERNESS IN MOUTH AND THROAT WITH OR WITHOUT PRESENCE OF ULCERS (sore throat, sores in mouth, or a toothache) UNUSUAL RASH, SWELLING OR PAIN  UNUSUAL VAGINAL DISCHARGE OR ITCHING   Items with * indicate a potential emergency and should be followed up as soon as possible or go to the Emergency Department if any problems should occur.  Please show the CHEMOTHERAPY ALERT  CARD or IMMUNOTHERAPY ALERT CARD at check-in to the Emergency Department and triage nurse.  Should you have questions after your visit or need to cancel or reschedule your appointment, please contact [place] [facility]  Dept: [phone]  and follow the prompts.  Office hours are 8:00 a.m. to 4:30 p.m. Monday - Friday. Please note that voicemails left after 4:00 p.m. may not be returned until the following business day.  We are closed weekends and major holidays. You have access to a nurse at all times for urgent questions. Please call the main number to the clinic Dept: [phone] and follow the prompts.   For any non-urgent questions, you may also contact your provider using MyChart. We now offer e-Visits for anyone 58 and older to request care online for non-urgent symptoms. For details visit [email or web address].   Also download the MyChart app! Go to the app store, search "MyChart", open the app, select [place], and log in with your MyChart username and password.  Oxaliplatin Injection What is this medication? OXALIPLATIN (ox AL i PLA tin) treats colorectal cancer. It works by slowing down the growth of cancer cells. This medicine may be used for other purposes; ask your health care provider or pharmacist if you have questions. COMMON BRAND NAME(S): Eloxatin What should I tell my care team before I take this medication? They need to know if you have any of these conditions: Heart disease History of irregular heartbeat or rhythm Liver disease Low blood cell levels (white cells, red cells, and platelets) Lung or breathing disease, such as asthma Take medications that treat or prevent  blood clots Tingling of the fingers, toes, or other nerve disorder An unusual or allergic reaction to oxaliplatin, other medications, foods, dyes, or preservatives If you or your partner are pregnant or trying to get pregnant Breast-feeding How should I use this  medication? This medication is injected into a vein. It is given by your care team in a hospital or clinic setting. Talk to your care team about the use of this medication in children. Special care may be needed. Overdosage: If you think you have taken too much of this medicine contact a poison control center or emergency room at once. NOTE: This medicine is only for you. Do not share this medicine with others. What if I miss a dose? Keep appointments for follow-up doses. It is important not to miss a dose. Call your care team if you are unable to keep an appointment. What may interact with this medication? Do not take this medication with any of the following: Cisapride Dronedarone Pimozide Thioridazine This medication may also interact with the following: Aspirin and aspirin-like medications Certain medications that treat or prevent blood clots, such as warfarin, apixaban, dabigatran, and rivaroxaban Cisplatin Cyclosporine Diuretics Medications for infection, such as acyclovir, adefovir, amphotericin B, bacitracin, cidofovir, foscarnet, ganciclovir, gentamicin, pentamidine, vancomycin NSAIDs, medications for pain and inflammation, such as ibuprofen or naproxen Other medications that cause heart rhythm changes Pamidronate Zoledronic acid This list may not describe all possible interactions. Give your health care provider a list of all the medicines, herbs, non-prescription drugs, or dietary supplements you use. Also tell them if you smoke, drink alcohol, or use illegal drugs. Some items may interact with your medicine. What should I watch for while using this medication? Your condition will be monitored carefully while you are receiving this medication. You may need blood work while taking this medication. This medication may make you feel generally unwell. This is not uncommon as chemotherapy can affect healthy cells as well as cancer cells. Report any side effects. Continue your course  of treatment even though you feel ill unless your care team tells you to stop. This medication may increase your risk of getting an infection. Call your care team for advice if you get a fever, chills, sore throat, or other symptoms of a cold or flu. Do not treat yourself. Try to avoid being around people who are sick. Avoid taking medications that contain aspirin, acetaminophen, ibuprofen, naproxen, or ketoprofen unless instructed by your care team. These medications may hide a fever. Be careful brushing or flossing your teeth or using a toothpick because you may get an infection or bleed more easily. If you have any dental work done, tell your dentist you are receiving this medication. This medication can make you more sensitive to cold. Do not drink cold drinks or use ice. Cover exposed skin before coming in contact with cold temperatures or cold objects. When out in cold weather wear warm clothing and cover your mouth and nose to warm the air that goes into your lungs. Tell your care team if you get sensitive to the cold. Talk to your care team if you or your partner are pregnant or think either of you might be pregnant. This medication can cause serious birth defects if taken during pregnancy and for 9 months after the last dose. A negative pregnancy test is required before starting this medication. A reliable form of contraception is recommended while taking this medication and for 9 months after the last dose. Talk to your care team about   effective forms of contraception. Do not father a child while taking this medication and for 6 months after the last dose. Use a condom while having sex during this time period. Do not breastfeed while taking this medication and for 3 months after the last dose. This medication may cause infertility. Talk to your care team if you are concerned about your fertility. What side effects may I notice from receiving this medication? Side effects that you should report to  your care team as soon as possible: Allergic reactions--skin rash, itching, hives, swelling of the face, lips, tongue, or throat Bleeding--bloody or black, tar-like stools, vomiting blood or brown material that looks like coffee grounds, red or dark brown urine, small red or purple spots on skin, unusual bruising or bleeding Dry cough, shortness of breath or trouble breathing Heart rhythm changes--fast or irregular heartbeat, dizziness, feeling faint or lightheaded, chest pain, trouble breathing Infection--fever, chills, cough, sore throat, wounds that don't heal, pain or trouble when passing urine, general feeling of discomfort or being unwell Liver injury--right upper belly pain, loss of appetite, nausea, light-colored stool, dark yellow or brown urine, yellowing skin or eyes, unusual weakness or fatigue Low red blood cell level--unusual weakness or fatigue, dizziness, headache, trouble breathing Muscle injury--unusual weakness or fatigue, muscle pain, dark yellow or brown urine, decrease in amount of urine Pain, tingling, or numbness in the hands or feet Sudden and severe headache, confusion, change in vision, seizures, which may be signs of posterior reversible encephalopathy syndrome (PRES) Unusual bruising or bleeding Side effects that usually do not require medical attention (report to your care team if they continue or are bothersome): Diarrhea Nausea Pain, redness, or swelling with sores inside the mouth or throat Unusual weakness or fatigue Vomiting This list may not describe all possible side effects. Call your doctor for medical advice about side effects. You may report side effects to FDA at 1-800-FDA-1088. Where should I keep my medication? This medication is given in a hospital or clinic. It will not be stored at home. NOTE: This sheet is a summary. It may not cover all possible information. If you have questions about this medicine, talk to your doctor, pharmacist, or health care  provider.  2024 Elsevier/Gold Standard (2023-02-10 00:00:00) Irinotecan Injection What is this medication? IRINOTECAN (ir in oh TEE kan) treats some types of cancer. It works by slowing down the growth of cancer cells. This medicine may be used for other purposes; ask your health care provider or pharmacist if you have questions. COMMON BRAND NAME(S): Camptosar What should I tell my care team before I take this medication? They need to know if you have any of these conditions: Dehydration Diarrhea Infection, especially a viral infection, such as chickenpox, cold sores, herpes Liver disease Low blood cell levels (white cells, red cells, and platelets) Low levels of electrolytes, such as calcium, magnesium, or potassium in your blood Recent or ongoing radiation An unusual or allergic reaction to irinotecan, other medications, foods, dyes, or preservatives If you or your partner are pregnant or trying to get pregnant Breast-feeding How should I use this medication? This medication is injected into a vein. It is given by your care team in a hospital or clinic setting. Talk to your care team about the use of this medication in children. Special care may be needed. Overdosage: If you think you have taken too much of this medicine contact a poison control center or emergency room at once. NOTE: This medicine is only for you. Do  not share this medicine with others. What if I miss a dose? Keep appointments for follow-up doses. It is important not to miss your dose. Call your care team if you are unable to keep an appointment. What may interact with this medication? Do not take this medication with any of the following: Cobicistat Itraconazole This medication may also interact with the following: Certain antibiotics, such as clarithromycin, rifampin, rifabutin Certain antivirals for HIV or AIDS Certain medications for fungal infections, such as ketoconazole, posaconazole,  voriconazole Certain medications for seizures, such as carbamazepine, phenobarbital, phenytoin Gemfibrozil Nefazodone St. John's wort This list may not describe all possible interactions. Give your health care provider a list of all the medicines, herbs, non-prescription drugs, or dietary supplements you use. Also tell them if you smoke, drink alcohol, or use illegal drugs. Some items may interact with your medicine. What should I watch for while using this medication? Your condition will be monitored carefully while you are receiving this medication. You may need blood work while taking this medication. This medication may make you feel generally unwell. This is not uncommon as chemotherapy can affect healthy cells as well as cancer cells. Report any side effects. Continue your course of treatment even though you feel ill unless your care team tells you to stop. This medication can cause serious side effects. To reduce the risk, your care team may give you other medications to take before receiving this one. Be sure to follow the directions from your care team. This medication may affect your coordination, reaction time, or judgement. Do not drive or operate machinery until you know how this medication affects you. Sit up or stand slowly to reduce the risk of dizzy or fainting spells. Drinking alcohol with this medication can increase the risk of these side effects. This medication may increase your risk of getting an infection. Call your care team for advice if you get a fever, chills, sore throat, or other symptoms of a cold or flu. Do not treat yourself. Try to avoid being around people who are sick. Avoid taking medications that contain aspirin, acetaminophen, ibuprofen, naproxen, or ketoprofen unless instructed by your care team. These medications may hide a fever. This medication may increase your risk to bruise or bleed. Call your care team if you notice any unusual bleeding. Be careful  brushing or flossing your teeth or using a toothpick because you may get an infection or bleed more easily. If you have any dental work done, tell your dentist you are receiving this medication. Talk to your care team if you or your partner are pregnant or think either of you might be pregnant. This medication can cause serious birth defects if taken during pregnancy and for 6 months after the last dose. You will need a negative pregnancy test before starting this medication. Contraception is recommended while taking this medication and for 6 months after the last dose. Your care team can help you find the option that works for you. Do not father a child while taking this medication and for 3 months after the last dose. Use a condom for contraception during this time period. Do not breastfeed while taking this medication and for 7 days after the last dose. This medication may cause infertility. Talk to your care team if you are concerned about your fertility. What side effects may I notice from receiving this medication? Side effects that you should report to your care team as soon as possible: Allergic reactions--skin rash, itching, hives, swelling of  the face, lips, tongue, or throat Dry cough, shortness of breath or trouble breathing Increased saliva or tears, increased sweating, stomach cramping, diarrhea, small pupils, unusual weakness or fatigue, slow heartbeat Infection--fever, chills, cough, sore throat, wounds that don't heal, pain or trouble when passing urine, general feeling of discomfort or being unwell Kidney injury--decrease in the amount of urine, swelling of the ankles, hands, or feet Low red blood cell level--unusual weakness or fatigue, dizziness, headache, trouble breathing Severe or prolonged diarrhea Unusual bruising or bleeding Side effects that usually do not require medical attention (report to your care team if they continue or are bothersome): Constipation Diarrhea Hair  loss Loss of appetite Nausea Stomach pain This list may not describe all possible side effects. Call your doctor for medical advice about side effects. You may report side effects to FDA at 1-800-FDA-1088. Where should I keep my medication? This medication is given in a hospital or clinic. It will not be stored at home. NOTE: This sheet is a summary. It may not cover all possible information. If you have questions about this medicine, talk to your doctor, pharmacist, or health care provider.  2024 Elsevier/Gold Standard (2022-04-16 00:00:00) Leucovorin Injection What is this medication? LEUCOVORIN (loo koe VOR in) prevents side effects from certain medications, such as methotrexate. It works by increasing folate levels. This helps protect healthy cells in your body. It may also be used to treat anemia caused by low levels of folate. It can also be used with fluorouracil, a type of chemotherapy, to treat colorectal cancer. It works by increasing the effects of fluorouracil in the body. This medicine may be used for other purposes; ask your health care provider or pharmacist if you have questions. What should I tell my care team before I take this medication? They need to know if you have any of these conditions: Anemia from low levels of vitamin B12 in the blood An unusual or allergic reaction to leucovorin, folic acid, other medications, foods, dyes, or preservatives Pregnant or trying to get pregnant Breastfeeding How should I use this medication? This medication is injected into a vein or a muscle. It is given by your care team in a hospital or clinic setting. Talk to your care team about the use of this medication in children. Special care may be needed. Overdosage: If you think you have taken too much of this medicine contact a poison control center or emergency room at once. NOTE: This medicine is only for you. Do not share this medicine with others. What if I miss a dose? Keep  appointments for follow-up doses. It is important not to miss your dose. Call your care team if you are unable to keep an appointment. What may interact with this medication? Capecitabine Fluorouracil Phenobarbital Phenytoin Primidone Trimethoprim;sulfamethoxazole This list may not describe all possible interactions. Give your health care provider a list of all the medicines, herbs, non-prescription drugs, or dietary supplements you use. Also tell them if you smoke, drink alcohol, or use illegal drugs. Some items may interact with your medicine. What should I watch for while using this medication? Your condition will be monitored carefully while you are receiving this medication. This medication may increase the side effects of 5-fluorouracil. Tell your care team if you have diarrhea or mouth sores that do not get better or that get worse. What side effects may I notice from receiving this medication? Side effects that you should report to your care team as soon as possible: Allergic reactions--skin rash,  itching, hives, swelling of the face, lips, tongue, or throat This list may not describe all possible side effects. Call your doctor for medical advice about side effects. You may report side effects to FDA at 1-800-FDA-1088. Where should I keep my medication? This medication is given in a hospital or clinic. It will not be stored at home. NOTE: This sheet is a summary. It may not cover all possible information. If you have questions about this medicine, talk to your doctor, pharmacist, or health care provider.  2024 Elsevier/Gold Standard (2022-05-08 00:00:00) Fluorouracil Injection What is this medication? FLUOROURACIL (flure oh YOOR a sil) treats some types of cancer. It works by slowing down the growth of cancer cells. This medicine may be used for other purposes; ask your health care provider or pharmacist if you have questions. COMMON BRAND NAME(S): Adrucil What should I tell my care  team before I take this medication? They need to know if you have any of these conditions: Blood disorders Dihydropyrimidine dehydrogenase (DPD) deficiency Infection, such as chickenpox, cold sores, herpes Kidney disease Liver disease Poor nutrition Recent or ongoing radiation therapy An unusual or allergic reaction to fluorouracil, other medications, foods, dyes, or preservatives If you or your partner are pregnant or trying to get pregnant Breast-feeding How should I use this medication? This medication is injected into a vein. It is administered by your care team in a hospital or clinic setting. Talk to your care team about the use of this medication in children. Special care may be needed. Overdosage: If you think you have taken too much of this medicine contact a poison control center or emergency room at once. NOTE: This medicine is only for you. Do not share this medicine with others. What if I miss a dose? Keep appointments for follow-up doses. It is important not to miss your dose. Call your care team if you are unable to keep an appointment. What may interact with this medication? Do not take this medication with any of the following: Live virus vaccines This medication may also interact with the following: Medications that treat or prevent blood clots, such as warfarin, enoxaparin, dalteparin This list may not describe all possible interactions. Give your health care provider a list of all the medicines, herbs, non-prescription drugs, or dietary supplements you use. Also tell them if you smoke, drink alcohol, or use illegal drugs. Some items may interact with your medicine. What should I watch for while using this medication? Your condition will be monitored carefully while you are receiving this medication. This medication may make you feel generally unwell. This is not uncommon as chemotherapy can affect healthy cells as well as cancer cells. Report any side effects. Continue  your course of treatment even though you feel ill unless your care team tells you to stop. In some cases, you may be given additional medications to help with side effects. Follow all directions for their use. This medication may increase your risk of getting an infection. Call your care team for advice if you get a fever, chills, sore throat, or other symptoms of a cold or flu. Do not treat yourself. Try to avoid being around people who are sick. This medication may increase your risk to bruise or bleed. Call your care team if you notice any unusual bleeding. Be careful brushing or flossing your teeth or using a toothpick because you may get an infection or bleed more easily. If you have any dental work done, tell your dentist you are receiving  this medication. Avoid taking medications that contain aspirin, acetaminophen, ibuprofen, naproxen, or ketoprofen unless instructed by your care team. These medications may hide a fever. Do not treat diarrhea with over the counter products. Contact your care team if you have diarrhea that lasts more than 2 days or if it is severe and watery. This medication can make you more sensitive to the sun. Keep out of the sun. If you cannot avoid being in the sun, wear protective clothing and sunscreen. Do not use sun lamps, tanning beds, or tanning booths. Talk to your care team if you or your partner wish to become pregnant or think you might be pregnant. This medication can cause serious birth defects if taken during pregnancy and for 3 months after the last dose. A reliable form of contraception is recommended while taking this medication and for 3 months after the last dose. Talk to your care team about effective forms of contraception. Do not father a child while taking this medication and for 3 months after the last dose. Use a condom while having sex during this time period. Do not breastfeed while taking this medication. This medication may cause infertility. Talk  to your care team if you are concerned about your fertility. What side effects may I notice from receiving this medication? Side effects that you should report to your care team as soon as possible: Allergic reactions--skin rash, itching, hives, swelling of the face, lips, tongue, or throat Heart attack--pain or tightness in the chest, shoulders, arms, or jaw, nausea, shortness of breath, cold or clammy skin, feeling faint or lightheaded Heart failure--shortness of breath, swelling of the ankles, feet, or hands, sudden weight gain, unusual weakness or fatigue Heart rhythm changes--fast or irregular heartbeat, dizziness, feeling faint or lightheaded, chest pain, trouble breathing High ammonia level--unusual weakness or fatigue, confusion, loss of appetite, nausea, vomiting, seizures Infection--fever, chills, cough, sore throat, wounds that don't heal, pain or trouble when passing urine, general feeling of discomfort or being unwell Low red blood cell level--unusual weakness or fatigue, dizziness, headache, trouble breathing Pain, tingling, or numbness in the hands or feet, muscle weakness, change in vision, confusion or trouble speaking, loss of balance or coordination, trouble walking, seizures Redness, swelling, and blistering of the skin over hands and feet Severe or prolonged diarrhea Unusual bruising or bleeding Side effects that usually do not require medical attention (report to your care team if they continue or are bothersome): Dry skin Headache Increased tears Nausea Pain, redness, or swelling with sores inside the mouth or throat Sensitivity to light Vomiting This list may not describe all possible side effects. Call your doctor for medical advice about side effects. You may report side effects to FDA at 1-800-FDA-1088. Where should I keep my medication? This medication is given in a hospital or clinic. It will not be stored at home. NOTE: This sheet is a summary. It may not cover  all possible information. If you have questions about this medicine, talk to your doctor, pharmacist, or health care provider.  2024 Elsevier/Gold Standard (2022-04-10 00:00:00)

## 2023-07-31 NOTE — Progress Notes (Signed)
Patient seen by Lisa Thomas NP today  Vitals are within treatment parameters.  Labs reviewed by Lisa Thomas NP and are within treatment parameters.  Per physician team, patient is ready for treatment and there are NO modifications to the treatment plan.     

## 2023-07-31 NOTE — Progress Notes (Signed)
[facility] OFFICE PROGRESS NOTE   Diagnosis: Pancreas cancer  INTERVAL HISTORY:   David [name] returns as scheduled.  He completed cycle 3 FOLFIRINOX 07/17/2023.  He denies nausea/vomiting.  He had a single mouth sore, now resolved.  No diarrhea.  No significant cold sensitivity.  Had an episode of tingling in 1 finger on the left hand that lasted 4 to 5 seconds.  He developed hiccups 07/18/2023.  The hiccups resolved when he began Reglan.  Objective:  Vital signs in last 24 hours:  Blood pressure (!) 143/89, pulse 89, temperature 98.2 F (36.8 C), temperature source Oral, resp. rate 18, height 5\' 8"  (1.727 m), weight 159 lb 4.8 oz (72.3 kg), SpO2 99%.    HEENT: No thrush or ulcers. Resp: Lungs clear bilaterally. Cardio: Regular rate and rhythm. GI: Abdomen soft and nontender.  No hepatosplenomegaly. Vascular: No leg edema. Neuro: Vibratory sense mildly decreased over the fingertips for tuning fork exam. Skin: Palms without erythema. Port-A-Cath without erythema.  Lab Results:  Lab Results  Component Value Date   WBC 4.4 07/31/2023   HGB 12.5 (L) 07/31/2023   HCT 36.9 (L) 07/31/2023   MCV 88.1 07/31/2023   PLT 140 (L) 07/31/2023   NEUTROABS 2.5 07/31/2023    Imaging:  No results found.  Medications: I have reviewed the patient's current medications.  Assessment/Plan: Pancreas cancer CT angiogram chest 04/24/2023-low-density mass in the dome of the liver with at least 3 additional lesions in both hepatic lobes, borderline lymphadenopathy in the hepatoduodenal ligament Right upper quadrant ultrasound 04/24/2023-large circumscribed mass of the liver dome CT abdomen/pelvis 04/24/2023-multiple liver lesions poorly defined on noncontrast exam, prominent portacaval and porta hepatis nodes MRI abdomen 05/05/2023-multiple rim-hypoenhancing liver lesions consistent with hepatic metastases, suspicion for a pancreas tail mass Ultrasound-guided biopsy of dominant right  liver lesion 05/10/2023-adenocarcinoma, CK7 and CDX2 positive, abundant cytoplasm and mucin with extracellular mucin, foci of lymphovascular invasion Tempus gene panel-K-ras G12V, T P53 mild tumor mutation burden 3.2, MSS PET 06/04/2023-multiple hypermetabolic liver lesions consistent with metastases, low-level FDG uptake in the soft tissue fullness at the tip of the pancreas tail, low-level FDG activity involving a small soft tissue nodule between the gastric fundus and spleen, tiny foci of accumulation at the right costovertebral junction at T10 and roof of the left acetabulum without an underlying CT lesion Evaded CEA and CA 19-9 EUS 06/07/2023 ,22x 15 mm irregular mass in the tail the pancreas, 11 mm subcarinal lymph node, FNA biopsy of the pancreas mass-suspicious for malignancy Cycle 1 FOLFIRINOX 06/19/2023 Cycle 2 FOLFIRINOX 07/03/2023 Cycle 3 FOLFIRINOX 07/17/2023 Cycle 4 FOLFIRINOX 07/31/2023 Family history of pancreas cancerINVITAE panel 05/17/2023-NF1 VUS Severe hiccups following cycle 1 FOLFIRINOX, did not respond to baclofen or gabapentin.  Resolved with Reglan.  Disposition: David [name] appears stable.  He has completed 3 cycles of FOLFIRINOX.  He continues to tolerate treatment well.  Plan to proceed with cycle 4 today as scheduled.  CBC and chemistry panel reviewed.  Labs adequate to proceed as above.  He will return for follow-up and treatment in 2 weeks.  We are available to see him sooner if needed.  Patient seen with Dr. Truett Perna.    Lonna Cobb ANP/GNP-BC   07/31/2023  10:47 AM David [name] has completed 3 cycles of FOLFIRINOX.  He has tolerated the treatment well.  Hiccups are relieved with Reglan.  He will complete cycle 4 today.  We discussed the treatment plan with David [name].  He would like to wait on surveillance  imaging until he is scheduled to be seen at [facility].

## 2023-08-01 LAB — CANCER ANTIGEN 19-9: CA 19-9: 847 U/mL — ABNORMAL HIGH (ref 0–35)

## 2023-08-02 ENCOUNTER — Inpatient Hospital Stay: Payer: 59

## 2023-08-02 VITALS — BP 136/89 | HR 103 | Temp 100.6°F | Resp 18

## 2023-08-02 DIAGNOSIS — C787 Secondary malignant neoplasm of liver and intrahepatic bile duct: Secondary | ICD-10-CM

## 2023-08-02 DIAGNOSIS — C252 Malignant neoplasm of tail of pancreas: Secondary | ICD-10-CM | POA: Diagnosis not present

## 2023-08-02 MED ORDER — SODIUM CHLORIDE 0.9% FLUSH
10.0000 mL | INTRAVENOUS | Status: DC | PRN
  Administered 2023-08-02: 10 mL

## 2023-08-02 MED ORDER — PEGFILGRASTIM-JMDB 6 MG/0.6ML ~~LOC~~ SOSY
6.0000 mg | PREFILLED_SYRINGE | Freq: Once | SUBCUTANEOUS | Status: AC
  Administered 2023-08-02: 6 mg via SUBCUTANEOUS
  Filled 2023-08-02: qty 0.6

## 2023-08-02 MED ORDER — HEPARIN SOD (PORK) LOCK FLUSH 100 UNIT/ML IV SOLN
500.0000 [IU] | Freq: Once | INTRAVENOUS | Status: AC | PRN
  Administered 2023-08-02: 500 [IU]

## 2023-08-02 NOTE — Progress Notes (Signed)
Patient with fever today upon assessment.  Patient reported only symptom was dry cough but thought it may be due to having acid reflux.  Patient denied runny nose, chills, other symptoms of fever.  Lonna Cobb, NP notified.  Per Lonna Cobb, NP patient is instructed to take tylenol or ibuprofen as needed for fever/fever symptoms.  If fever does not subside in a couple of days or symptoms become worse, patient should contact office or go to the ED.  Patient informed of Heywood Footman response and verbalized understanding.

## 2023-08-02 NOTE — Patient Instructions (Signed)

## 2023-08-05 ENCOUNTER — Telehealth: Payer: Self-pay

## 2023-08-05 NOTE — Telephone Encounter (Signed)
VM from pt stating that his hiccups have returned with his recent chemo tx. He reports that they started 08/01/2023. He reports taking reglan as prescribed with no relief. Pt reports no re-occurrence pf low-grade fever and no other s/s other than fatigue. Pt requests recommendations for the hiccups.   Called pt back as requested. Per Lonna Cobb, NP pt advised to take his Reglan 4 times daily until the hiccups stop and then continue taking Reglan through the day after his hiccups stop. Pt verbalizes understanding and agrees with plan of care.

## 2023-08-06 ENCOUNTER — Telehealth: Payer: Self-pay | Admitting: *Deleted

## 2023-08-06 ENCOUNTER — Other Ambulatory Visit: Payer: Self-pay | Admitting: Hematology

## 2023-08-06 DIAGNOSIS — C259 Malignant neoplasm of pancreas, unspecified: Secondary | ICD-10-CM

## 2023-08-06 NOTE — Telephone Encounter (Signed)
Called to report he and his wife have both tested positive for COVID today and PCP has started him on Paxlovid. He is asking if this is OK. Reassured him that he should definitely take the Paxlovid.

## 2023-08-11 ENCOUNTER — Other Ambulatory Visit: Payer: Self-pay | Admitting: Oncology

## 2023-08-11 DIAGNOSIS — C259 Malignant neoplasm of pancreas, unspecified: Secondary | ICD-10-CM

## 2023-08-13 ENCOUNTER — Ambulatory Visit: Payer: BC Managed Care – PPO | Admitting: Nurse Practitioner

## 2023-08-13 ENCOUNTER — Other Ambulatory Visit: Payer: BC Managed Care – PPO

## 2023-08-14 ENCOUNTER — Inpatient Hospital Stay: Payer: 59

## 2023-08-14 ENCOUNTER — Ambulatory Visit: Payer: BC Managed Care – PPO

## 2023-08-14 ENCOUNTER — Inpatient Hospital Stay (HOSPITAL_BASED_OUTPATIENT_CLINIC_OR_DEPARTMENT_OTHER): Payer: 59 | Admitting: Oncology

## 2023-08-14 ENCOUNTER — Other Ambulatory Visit: Payer: Self-pay | Admitting: *Deleted

## 2023-08-14 ENCOUNTER — Encounter: Payer: Self-pay | Admitting: *Deleted

## 2023-08-14 VITALS — BP 130/79 | HR 88 | Temp 98.1°F | Resp 18 | Ht 68.0 in | Wt 159.4 lb

## 2023-08-14 VITALS — BP 152/77 | HR 69 | Resp 18

## 2023-08-14 DIAGNOSIS — C259 Malignant neoplasm of pancreas, unspecified: Secondary | ICD-10-CM

## 2023-08-14 DIAGNOSIS — C787 Secondary malignant neoplasm of liver and intrahepatic bile duct: Secondary | ICD-10-CM

## 2023-08-14 DIAGNOSIS — C252 Malignant neoplasm of tail of pancreas: Secondary | ICD-10-CM | POA: Diagnosis not present

## 2023-08-14 LAB — CBC WITH DIFFERENTIAL (CANCER CENTER ONLY)
Abs Immature Granulocytes: 0.36 10*3/uL — ABNORMAL HIGH (ref 0.00–0.07)
Basophils Absolute: 0 10*3/uL (ref 0.0–0.1)
Basophils Relative: 1 %
Eosinophils Absolute: 0.1 10*3/uL (ref 0.0–0.5)
Eosinophils Relative: 1 %
HCT: 37.1 % — ABNORMAL LOW (ref 39.0–52.0)
Hemoglobin: 12.5 g/dL — ABNORMAL LOW (ref 13.0–17.0)
Immature Granulocytes: 6 %
Lymphocytes Relative: 24 %
Lymphs Abs: 1.5 10*3/uL (ref 0.7–4.0)
MCH: 30.3 pg (ref 26.0–34.0)
MCHC: 33.7 g/dL (ref 30.0–36.0)
MCV: 90 fL (ref 80.0–100.0)
Monocytes Absolute: 0.8 10*3/uL (ref 0.1–1.0)
Monocytes Relative: 13 %
Neutro Abs: 3.4 10*3/uL (ref 1.7–7.7)
Neutrophils Relative %: 55 %
Platelet Count: 266 10*3/uL (ref 150–400)
RBC: 4.12 MIL/uL — ABNORMAL LOW (ref 4.22–5.81)
RDW: 15.8 % — ABNORMAL HIGH (ref 11.5–15.5)
WBC Count: 6.2 10*3/uL (ref 4.0–10.5)
nRBC: 0 % (ref 0.0–0.2)

## 2023-08-14 LAB — CMP (CANCER CENTER ONLY)
ALT: 24 U/L (ref 0–44)
AST: 20 U/L (ref 15–41)
Albumin: 4 g/dL (ref 3.5–5.0)
Alkaline Phosphatase: 131 U/L — ABNORMAL HIGH (ref 38–126)
Anion gap: 10 (ref 5–15)
BUN: 19 mg/dL (ref 6–20)
CO2: 25 mmol/L (ref 22–32)
Calcium: 9.4 mg/dL (ref 8.9–10.3)
Chloride: 102 mmol/L (ref 98–111)
Creatinine: 0.79 mg/dL (ref 0.61–1.24)
GFR, Estimated: >60 mL/min (ref >60)
Glucose, Bld: 231 mg/dL — ABNORMAL HIGH (ref 70–99)
Potassium: 4 mmol/L (ref 3.5–5.1)
Sodium: 137 mmol/L (ref 135–145)
Total Bilirubin: 0.3 mg/dL (ref 0.3–1.2)
Total Protein: 7 g/dL (ref 6.5–8.1)

## 2023-08-14 MED ORDER — SODIUM CHLORIDE 0.9% FLUSH: 10.0000 mL | INTRAVENOUS | Status: DC | PRN

## 2023-08-14 MED ORDER — ATROPINE SULFATE 1 MG/ML IV SOLN
0.5000 mg | Freq: Once | INTRAVENOUS | Status: AC | PRN
  Administered 2023-08-14: 0.5 mg via INTRAVENOUS
  Filled 2023-08-14: qty 1

## 2023-08-14 MED ORDER — SODIUM CHLORIDE 0.9 % IV SOLN: 5.0000 mg | Freq: Once | INTRAVENOUS | Status: DC

## 2023-08-14 MED ORDER — METOCLOPRAMIDE HCL 10 MG PO TABS: 10.0000 mg | ORAL_TABLET | Freq: Three times a day (TID) | ORAL | 1 refills | Status: DC

## 2023-08-14 MED ORDER — SODIUM CHLORIDE 0.9 % IV SOLN
2400.0000 mg/m2 | INTRAVENOUS | Status: DC
  Administered 2023-08-14: 4450 mg via INTRAVENOUS
  Filled 2023-08-14: qty 89

## 2023-08-14 MED ORDER — DEXTROSE 5 % IV SOLN: Freq: Once | INTRAVENOUS | Status: AC

## 2023-08-14 MED ORDER — SODIUM CHLORIDE 0.9 % IV SOLN
400.0000 mg/m2 | Freq: Once | INTRAVENOUS | Status: AC
  Administered 2023-08-14: 744 mg via INTRAVENOUS
  Filled 2023-08-14: qty 37.2

## 2023-08-14 MED ORDER — SODIUM CHLORIDE 0.9 % IV SOLN
150.0000 mg/m2 | Freq: Once | INTRAVENOUS | Status: AC
  Administered 2023-08-14: 300 mg via INTRAVENOUS
  Filled 2023-08-14: qty 15

## 2023-08-14 MED ORDER — SODIUM CHLORIDE 0.9 % IV SOLN
150.0000 mg | Freq: Once | INTRAVENOUS | Status: AC
  Administered 2023-08-14: 150 mg via INTRAVENOUS
  Filled 2023-08-14: qty 150

## 2023-08-14 MED ORDER — DEXAMETHASONE SODIUM PHOSPHATE 10 MG/ML IJ SOLN
5.0000 mg | Freq: Once | INTRAMUSCULAR | Status: AC
  Administered 2023-08-14: 5 mg via INTRAVENOUS
  Filled 2023-08-14: qty 1

## 2023-08-14 MED ORDER — PALONOSETRON HCL INJECTION 0.25 MG/5ML
0.2500 mg | Freq: Once | INTRAVENOUS | Status: AC
  Administered 2023-08-14: 0.25 mg via INTRAVENOUS
  Filled 2023-08-14: qty 5

## 2023-08-14 MED ORDER — OXALIPLATIN CHEMO INJECTION 100 MG/20ML
65.0000 mg/m2 | Freq: Once | INTRAVENOUS | Status: AC
  Administered 2023-08-14: 120 mg via INTRAVENOUS
  Filled 2023-08-14: qty 20

## 2023-08-14 NOTE — Progress Notes (Signed)
[facility] OFFICE PROGRESS NOTE   Diagnosis: Pancreas cancer  INTERVAL HISTORY:   Mr. Mauch completed another cycle of FOLFIRINOX on 07/31/2023.  No mouth sores, nausea, or diarrhea.  Mild cold sensitivity for a few days following chemotherapy.  No other neuropathy symptoms.  He developed severe hiccups beginning on day 3 or day 4.  The hiccups lasted for 6 days.  The hiccups persisted despite metoclopramide.  Objective:  Vital signs in last 24 hours:  Blood pressure 130/79, pulse 88, temperature 98.1 F (36.7 C), temperature source Oral, resp. rate 18, height 5\' 8"  (1.727 m), weight 159 lb 6.4 oz (72.3 kg), SpO2 100%.    HEENT: 2 mm ulcer at the left palate, no other ulcers.  No thrush Resp: Lungs clear bilaterally Cardio: Regular rate and rhythm GI: No hepatosplenomegaly, no mass, nontender Vascular: No leg edema Neuro: Mild-moderate loss of vibratory sense of the right fingers, normal to very mild loss of vibratory sense at the left fingers Skin: Palms without erythema  Portacath/PICC-without erythema  Lab Results:  Lab Results  Component Value Date   WBC 6.2 08/14/2023   HGB 12.5 (L) 08/14/2023   HCT 37.1 (L) 08/14/2023   MCV 90.0 08/14/2023   PLT 266 08/14/2023   NEUTROABS 3.4 08/14/2023    CMP  Lab Results  Component Value Date   NA 137 08/14/2023   K 4.0 08/14/2023   CL 102 08/14/2023   CO2 25 08/14/2023   GLUCOSE 231 (H) 08/14/2023   BUN 19 08/14/2023   CREATININE 0.79 08/14/2023   CALCIUM 9.4 08/14/2023   PROT 7.0 08/14/2023   ALBUMIN 4.0 08/14/2023   AST 20 08/14/2023   ALT 24 08/14/2023   ALKPHOS 131 (H) 08/14/2023   BILITOT 0.3 08/14/2023   GFRNONAA >60 08/14/2023   GFRAA >60 03/26/2018    Lab Results  Component Value Date   CEA 772.45 (H) 05/02/2023   YTK160 847 (H) 07/31/2023     Medications: I have reviewed the patient's current medications.   Assessment/Plan: Pancreas cancer CT angiogram chest  04/24/2023-low-density mass in the dome of the liver with at least 3 additional lesions in both hepatic lobes, borderline lymphadenopathy in the hepatoduodenal ligament Right upper quadrant ultrasound 04/24/2023-large circumscribed mass of the liver dome CT abdomen/pelvis 04/24/2023-multiple liver lesions poorly defined on noncontrast exam, prominent portacaval and porta hepatis nodes MRI abdomen 05/05/2023-multiple rim-hypoenhancing liver lesions consistent with hepatic metastases, suspicion for a pancreas tail mass Ultrasound-guided biopsy of dominant right liver lesion 05/10/2023-adenocarcinoma, CK7 and CDX2 positive, abundant cytoplasm and mucin with extracellular mucin, foci of lymphovascular invasion Tempus gene panel-K-ras G12V, T P53 mild tumor mutation burden 3.2, MSS PET 06/04/2023-multiple hypermetabolic liver lesions consistent with metastases, low-level FDG uptake in the soft tissue fullness at the tip of the pancreas tail, low-level FDG activity involving a small soft tissue nodule between the gastric fundus and spleen, tiny foci of accumulation at the right costovertebral junction at T10 and roof of the left acetabulum without an underlying CT lesion Evaded CEA and CA 19-9 EUS 06/07/2023 ,22x 15 mm irregular mass in the tail the pancreas, 11 mm subcarinal lymph node, FNA biopsy of the pancreas mass-suspicious for malignancy Cycle 1 FOLFIRINOX 06/19/2023 Cycle 2 FOLFIRINOX 07/03/2023 Cycle 3 FOLFIRINOX 07/17/2023 Cycle 4 FOLFIRINOX 07/31/2023 Cycle 5 FOLFIRINOX 08/14/2023, oxaliplatin and Decadron dose reduced Family history of pancreas cancerINVITAE panel 05/17/2023-NF1 VUS Severe hiccups following cycle 1 FOLFIRINOX, did not respond to baclofen or gabapentin.  Resolved with Reglan.  Recurrent hiccups  following cycle 4 FOLFIRINOX-not relieved with Reglan    Disposition: Mr. Lobello has completed 4 cycles FOLFIRINOX.  He has tolerated the chemotherapy well.  The CA 19-9 was lower on 07/31/2023.  We  will follow-up on the CA 19-9 from today.  He developed hiccups again following cycle 4.  The etiology of the hiccups is unclear.  The hiccups are likely related to Decadron, oxaliplatin, or irinotecan.  We will dose reduce Decadron and oxaliplatin with this cycle.  He will contact us if he develops hiccups that are unrelieved by metoclopramide.  He will return for an office visit and cycle 6 FOLFIRINOX in 2 weeks.  He is scheduled for staging at [facility] on 09/16/2023.  He would like to proceed with cycle 7 FOLFIRINOX on 09/11/2023. Thornton Papas, MD  08/14/2023  11:00 AM

## 2023-08-14 NOTE — Patient Instructions (Signed)

## 2023-08-14 NOTE — Progress Notes (Signed)
Patient seen by Dr. Truett Perna today  Vitals are within treatment parameters.  Labs reviewed by Dr. Truett Perna and are within treatment parameters.  Per physician team, patient is ready for treatment. Please note that modifications are being made to the treatment plan including Dose reduction of oxaliplatin and dexamethasone premed.

## 2023-08-14 NOTE — Patient Instructions (Signed)
[facility] [facility]  Discharge Instructions: Thank you for choosing [facility] to provide your oncology and hematology care.   If you have a lab appointment with the Cancer Center, please go directly to the Cancer Center and check in at the registration area.   Wear comfortable clothing and clothing appropriate for easy access to any Portacath or PICC line.   We strive to give you quality time with your provider. You may need to reschedule your appointment if you arrive late (15 or more minutes).  Arriving late affects you and other patients whose appointments are after yours.  Also, if you miss three or more appointments without notifying the office, you may be dismissed from the clinic at the provider's discretion.      For prescription refill requests, have your pharmacy contact our office and allow 72 hours for refills to be completed.    Today you received the following chemotherapy and/or immunotherapy agents: oxaliplatin, irinotecan, leucovorin, fluorouracil.       To help prevent nausea and vomiting after your treatment, we encourage you to take your nausea medication as directed.  BELOW ARE SYMPTOMS THAT SHOULD BE REPORTED IMMEDIATELY: *FEVER GREATER THAN 100.4 F (38 C) OR HIGHER *CHILLS OR SWEATING *NAUSEA AND VOMITING THAT IS NOT CONTROLLED WITH YOUR NAUSEA MEDICATION *UNUSUAL SHORTNESS OF BREATH *UNUSUAL BRUISING OR BLEEDING *URINARY PROBLEMS (pain or burning when urinating, or frequent urination) *BOWEL PROBLEMS (unusual diarrhea, constipation, pain near the anus) TENDERNESS IN MOUTH AND THROAT WITH OR WITHOUT PRESENCE OF ULCERS (sore throat, sores in mouth, or a toothache) UNUSUAL RASH, SWELLING OR PAIN  UNUSUAL VAGINAL DISCHARGE OR ITCHING   Items with * indicate a potential emergency and should be followed up as soon as possible or go to the Emergency Department if any problems should occur.  Please show the CHEMOTHERAPY ALERT  CARD or IMMUNOTHERAPY ALERT CARD at check-in to the Emergency Department and triage nurse.  Should you have questions after your visit or need to cancel or reschedule your appointment, please contact [place] [facility]  Dept: [phone]  and follow the prompts.  Office hours are 8:00 a.m. to 4:30 p.m. Monday - Friday. Please note that voicemails left after 4:00 p.m. may not be returned until the following business day.  We are closed weekends and major holidays. You have access to a nurse at all times for urgent questions. Please call the main number to the clinic Dept: [phone] and follow the prompts.   For any non-urgent questions, you may also contact your provider using MyChart. We now offer e-Visits for anyone 58 and older to request care online for non-urgent symptoms. For details visit [email or web address].   Also download the MyChart app! Go to the app store, search "MyChart", open the app, select [place], and log in with your MyChart username and password.  Oxaliplatin Injection What is this medication? OXALIPLATIN (ox AL i PLA tin) treats colorectal cancer. It works by slowing down the growth of cancer cells. This medicine may be used for other purposes; ask your health care provider or pharmacist if you have questions. COMMON BRAND NAME(S): Eloxatin What should I tell my care team before I take this medication? They need to know if you have any of these conditions: Heart disease History of irregular heartbeat or rhythm Liver disease Low blood cell levels (white cells, red cells, and platelets) Lung or breathing disease, such as asthma Take medications that treat or prevent  blood clots Tingling of the fingers, toes, or other nerve disorder An unusual or allergic reaction to oxaliplatin, other medications, foods, dyes, or preservatives If you or your partner are pregnant or trying to get pregnant Breast-feeding How should I use this  medication? This medication is injected into a vein. It is given by your care team in a hospital or clinic setting. Talk to your care team about the use of this medication in children. Special care may be needed. Overdosage: If you think you have taken too much of this medicine contact a poison control center or emergency room at once. NOTE: This medicine is only for you. Do not share this medicine with others. What if I miss a dose? Keep appointments for follow-up doses. It is important not to miss a dose. Call your care team if you are unable to keep an appointment. What may interact with this medication? Do not take this medication with any of the following: Cisapride Dronedarone Pimozide Thioridazine This medication may also interact with the following: Aspirin and aspirin-like medications Certain medications that treat or prevent blood clots, such as warfarin, apixaban, dabigatran, and rivaroxaban Cisplatin Cyclosporine Diuretics Medications for infection, such as acyclovir, adefovir, amphotericin B, bacitracin, cidofovir, foscarnet, ganciclovir, gentamicin, pentamidine, vancomycin NSAIDs, medications for pain and inflammation, such as ibuprofen or naproxen Other medications that cause heart rhythm changes Pamidronate Zoledronic acid This list may not describe all possible interactions. Give your health care provider a list of all the medicines, herbs, non-prescription drugs, or dietary supplements you use. Also tell them if you smoke, drink alcohol, or use illegal drugs. Some items may interact with your medicine. What should I watch for while using this medication? Your condition will be monitored carefully while you are receiving this medication. You may need blood work while taking this medication. This medication may make you feel generally unwell. This is not uncommon as chemotherapy can affect healthy cells as well as cancer cells. Report any side effects. Continue your course  of treatment even though you feel ill unless your care team tells you to stop. This medication may increase your risk of getting an infection. Call your care team for advice if you get a fever, chills, sore throat, or other symptoms of a cold or flu. Do not treat yourself. Try to avoid being around people who are sick. Avoid taking medications that contain aspirin, acetaminophen, ibuprofen, naproxen, or ketoprofen unless instructed by your care team. These medications may hide a fever. Be careful brushing or flossing your teeth or using a toothpick because you may get an infection or bleed more easily. If you have any dental work done, tell your dentist you are receiving this medication. This medication can make you more sensitive to cold. Do not drink cold drinks or use ice. Cover exposed skin before coming in contact with cold temperatures or cold objects. When out in cold weather wear warm clothing and cover your mouth and nose to warm the air that goes into your lungs. Tell your care team if you get sensitive to the cold. Talk to your care team if you or your partner are pregnant or think either of you might be pregnant. This medication can cause serious birth defects if taken during pregnancy and for 9 months after the last dose. A negative pregnancy test is required before starting this medication. A reliable form of contraception is recommended while taking this medication and for 9 months after the last dose. Talk to your care team about   effective forms of contraception. Do not father a child while taking this medication and for 6 months after the last dose. Use a condom while having sex during this time period. Do not breastfeed while taking this medication and for 3 months after the last dose. This medication may cause infertility. Talk to your care team if you are concerned about your fertility. What side effects may I notice from receiving this medication? Side effects that you should report to  your care team as soon as possible: Allergic reactions--skin rash, itching, hives, swelling of the face, lips, tongue, or throat Bleeding--bloody or black, tar-like stools, vomiting blood or brown material that looks like coffee grounds, red or dark brown urine, small red or purple spots on skin, unusual bruising or bleeding Dry cough, shortness of breath or trouble breathing Heart rhythm changes--fast or irregular heartbeat, dizziness, feeling faint or lightheaded, chest pain, trouble breathing Infection--fever, chills, cough, sore throat, wounds that don't heal, pain or trouble when passing urine, general feeling of discomfort or being unwell Liver injury--right upper belly pain, loss of appetite, nausea, light-colored stool, dark yellow or brown urine, yellowing skin or eyes, unusual weakness or fatigue Low red blood cell level--unusual weakness or fatigue, dizziness, headache, trouble breathing Muscle injury--unusual weakness or fatigue, muscle pain, dark yellow or brown urine, decrease in amount of urine Pain, tingling, or numbness in the hands or feet Sudden and severe headache, confusion, change in vision, seizures, which may be signs of posterior reversible encephalopathy syndrome (PRES) Unusual bruising or bleeding Side effects that usually do not require medical attention (report to your care team if they continue or are bothersome): Diarrhea Nausea Pain, redness, or swelling with sores inside the mouth or throat Unusual weakness or fatigue Vomiting This list may not describe all possible side effects. Call your doctor for medical advice about side effects. You may report side effects to FDA at 1-800-FDA-1088. Where should I keep my medication? This medication is given in a hospital or clinic. It will not be stored at home. NOTE: This sheet is a summary. It may not cover all possible information. If you have questions about this medicine, talk to your doctor, pharmacist, or health care  provider.  2024 Elsevier/Gold Standard (2023-02-10 00:00:00) Irinotecan Injection What is this medication? IRINOTECAN (ir in oh TEE kan) treats some types of cancer. It works by slowing down the growth of cancer cells. This medicine may be used for other purposes; ask your health care provider or pharmacist if you have questions. COMMON BRAND NAME(S): Camptosar What should I tell my care team before I take this medication? They need to know if you have any of these conditions: Dehydration Diarrhea Infection, especially a viral infection, such as chickenpox, cold sores, herpes Liver disease Low blood cell levels (white cells, red cells, and platelets) Low levels of electrolytes, such as calcium, magnesium, or potassium in your blood Recent or ongoing radiation An unusual or allergic reaction to irinotecan, other medications, foods, dyes, or preservatives If you or your partner are pregnant or trying to get pregnant Breast-feeding How should I use this medication? This medication is injected into a vein. It is given by your care team in a hospital or clinic setting. Talk to your care team about the use of this medication in children. Special care may be needed. Overdosage: If you think you have taken too much of this medicine contact a poison control center or emergency room at once. NOTE: This medicine is only for you. Do  not share this medicine with others. What if I miss a dose? Keep appointments for follow-up doses. It is important not to miss your dose. Call your care team if you are unable to keep an appointment. What may interact with this medication? Do not take this medication with any of the following: Cobicistat Itraconazole This medication may also interact with the following: Certain antibiotics, such as clarithromycin, rifampin, rifabutin Certain antivirals for HIV or AIDS Certain medications for fungal infections, such as ketoconazole, posaconazole,  voriconazole Certain medications for seizures, such as carbamazepine, phenobarbital, phenytoin Gemfibrozil Nefazodone St. John's wort This list may not describe all possible interactions. Give your health care provider a list of all the medicines, herbs, non-prescription drugs, or dietary supplements you use. Also tell them if you smoke, drink alcohol, or use illegal drugs. Some items may interact with your medicine. What should I watch for while using this medication? Your condition will be monitored carefully while you are receiving this medication. You may need blood work while taking this medication. This medication may make you feel generally unwell. This is not uncommon as chemotherapy can affect healthy cells as well as cancer cells. Report any side effects. Continue your course of treatment even though you feel ill unless your care team tells you to stop. This medication can cause serious side effects. To reduce the risk, your care team may give you other medications to take before receiving this one. Be sure to follow the directions from your care team. This medication may affect your coordination, reaction time, or judgement. Do not drive or operate machinery until you know how this medication affects you. Sit up or stand slowly to reduce the risk of dizzy or fainting spells. Drinking alcohol with this medication can increase the risk of these side effects. This medication may increase your risk of getting an infection. Call your care team for advice if you get a fever, chills, sore throat, or other symptoms of a cold or flu. Do not treat yourself. Try to avoid being around people who are sick. Avoid taking medications that contain aspirin, acetaminophen, ibuprofen, naproxen, or ketoprofen unless instructed by your care team. These medications may hide a fever. This medication may increase your risk to bruise or bleed. Call your care team if you notice any unusual bleeding. Be careful  brushing or flossing your teeth or using a toothpick because you may get an infection or bleed more easily. If you have any dental work done, tell your dentist you are receiving this medication. Talk to your care team if you or your partner are pregnant or think either of you might be pregnant. This medication can cause serious birth defects if taken during pregnancy and for 6 months after the last dose. You will need a negative pregnancy test before starting this medication. Contraception is recommended while taking this medication and for 6 months after the last dose. Your care team can help you find the option that works for you. Do not father a child while taking this medication and for 3 months after the last dose. Use a condom for contraception during this time period. Do not breastfeed while taking this medication and for 7 days after the last dose. This medication may cause infertility. Talk to your care team if you are concerned about your fertility. What side effects may I notice from receiving this medication? Side effects that you should report to your care team as soon as possible: Allergic reactions--skin rash, itching, hives, swelling of  the face, lips, tongue, or throat Dry cough, shortness of breath or trouble breathing Increased saliva or tears, increased sweating, stomach cramping, diarrhea, small pupils, unusual weakness or fatigue, slow heartbeat Infection--fever, chills, cough, sore throat, wounds that don't heal, pain or trouble when passing urine, general feeling of discomfort or being unwell Kidney injury--decrease in the amount of urine, swelling of the ankles, hands, or feet Low red blood cell level--unusual weakness or fatigue, dizziness, headache, trouble breathing Severe or prolonged diarrhea Unusual bruising or bleeding Side effects that usually do not require medical attention (report to your care team if they continue or are bothersome): Constipation Diarrhea Hair  loss Loss of appetite Nausea Stomach pain This list may not describe all possible side effects. Call your doctor for medical advice about side effects. You may report side effects to FDA at 1-800-FDA-1088. Where should I keep my medication? This medication is given in a hospital or clinic. It will not be stored at home. NOTE: This sheet is a summary. It may not cover all possible information. If you have questions about this medicine, talk to your doctor, pharmacist, or health care provider.  2024 Elsevier/Gold Standard (2022-04-16 00:00:00) Leucovorin Injection What is this medication? LEUCOVORIN (loo koe VOR in) prevents side effects from certain medications, such as methotrexate. It works by increasing folate levels. This helps protect healthy cells in your body. It may also be used to treat anemia caused by low levels of folate. It can also be used with fluorouracil, a type of chemotherapy, to treat colorectal cancer. It works by increasing the effects of fluorouracil in the body. This medicine may be used for other purposes; ask your health care provider or pharmacist if you have questions. What should I tell my care team before I take this medication? They need to know if you have any of these conditions: Anemia from low levels of vitamin B12 in the blood An unusual or allergic reaction to leucovorin, folic acid, other medications, foods, dyes, or preservatives Pregnant or trying to get pregnant Breastfeeding How should I use this medication? This medication is injected into a vein or a muscle. It is given by your care team in a hospital or clinic setting. Talk to your care team about the use of this medication in children. Special care may be needed. Overdosage: If you think you have taken too much of this medicine contact a poison control center or emergency room at once. NOTE: This medicine is only for you. Do not share this medicine with others. What if I miss a dose? Keep  appointments for follow-up doses. It is important not to miss your dose. Call your care team if you are unable to keep an appointment. What may interact with this medication? Capecitabine Fluorouracil Phenobarbital Phenytoin Primidone Trimethoprim;sulfamethoxazole This list may not describe all possible interactions. Give your health care provider a list of all the medicines, herbs, non-prescription drugs, or dietary supplements you use. Also tell them if you smoke, drink alcohol, or use illegal drugs. Some items may interact with your medicine. What should I watch for while using this medication? Your condition will be monitored carefully while you are receiving this medication. This medication may increase the side effects of 5-fluorouracil. Tell your care team if you have diarrhea or mouth sores that do not get better or that get worse. What side effects may I notice from receiving this medication? Side effects that you should report to your care team as soon as possible: Allergic reactions--skin rash,  itching, hives, swelling of the face, lips, tongue, or throat This list may not describe all possible side effects. Call your doctor for medical advice about side effects. You may report side effects to FDA at 1-800-FDA-1088. Where should I keep my medication? This medication is given in a hospital or clinic. It will not be stored at home. NOTE: This sheet is a summary. It may not cover all possible information. If you have questions about this medicine, talk to your doctor, pharmacist, or health care provider.  2024 Elsevier/Gold Standard (2022-05-08 00:00:00) Fluorouracil Injection What is this medication? FLUOROURACIL (flure oh YOOR a sil) treats some types of cancer. It works by slowing down the growth of cancer cells. This medicine may be used for other purposes; ask your health care provider or pharmacist if you have questions. COMMON BRAND NAME(S): Adrucil What should I tell my care  team before I take this medication? They need to know if you have any of these conditions: Blood disorders Dihydropyrimidine dehydrogenase (DPD) deficiency Infection, such as chickenpox, cold sores, herpes Kidney disease Liver disease Poor nutrition Recent or ongoing radiation therapy An unusual or allergic reaction to fluorouracil, other medications, foods, dyes, or preservatives If you or your partner are pregnant or trying to get pregnant Breast-feeding How should I use this medication? This medication is injected into a vein. It is administered by your care team in a hospital or clinic setting. Talk to your care team about the use of this medication in children. Special care may be needed. Overdosage: If you think you have taken too much of this medicine contact a poison control center or emergency room at once. NOTE: This medicine is only for you. Do not share this medicine with others. What if I miss a dose? Keep appointments for follow-up doses. It is important not to miss your dose. Call your care team if you are unable to keep an appointment. What may interact with this medication? Do not take this medication with any of the following: Live virus vaccines This medication may also interact with the following: Medications that treat or prevent blood clots, such as warfarin, enoxaparin, dalteparin This list may not describe all possible interactions. Give your health care provider a list of all the medicines, herbs, non-prescription drugs, or dietary supplements you use. Also tell them if you smoke, drink alcohol, or use illegal drugs. Some items may interact with your medicine. What should I watch for while using this medication? Your condition will be monitored carefully while you are receiving this medication. This medication may make you feel generally unwell. This is not uncommon as chemotherapy can affect healthy cells as well as cancer cells. Report any side effects. Continue  your course of treatment even though you feel ill unless your care team tells you to stop. In some cases, you may be given additional medications to help with side effects. Follow all directions for their use. This medication may increase your risk of getting an infection. Call your care team for advice if you get a fever, chills, sore throat, or other symptoms of a cold or flu. Do not treat yourself. Try to avoid being around people who are sick. This medication may increase your risk to bruise or bleed. Call your care team if you notice any unusual bleeding. Be careful brushing or flossing your teeth or using a toothpick because you may get an infection or bleed more easily. If you have any dental work done, tell your dentist you are receiving  this medication. Avoid taking medications that contain aspirin, acetaminophen, ibuprofen, naproxen, or ketoprofen unless instructed by your care team. These medications may hide a fever. Do not treat diarrhea with over the counter products. Contact your care team if you have diarrhea that lasts more than 2 days or if it is severe and watery. This medication can make you more sensitive to the sun. Keep out of the sun. If you cannot avoid being in the sun, wear protective clothing and sunscreen. Do not use sun lamps, tanning beds, or tanning booths. Talk to your care team if you or your partner wish to become pregnant or think you might be pregnant. This medication can cause serious birth defects if taken during pregnancy and for 3 months after the last dose. A reliable form of contraception is recommended while taking this medication and for 3 months after the last dose. Talk to your care team about effective forms of contraception. Do not father a child while taking this medication and for 3 months after the last dose. Use a condom while having sex during this time period. Do not breastfeed while taking this medication. This medication may cause infertility. Talk  to your care team if you are concerned about your fertility. What side effects may I notice from receiving this medication? Side effects that you should report to your care team as soon as possible: Allergic reactions--skin rash, itching, hives, swelling of the face, lips, tongue, or throat Heart attack--pain or tightness in the chest, shoulders, arms, or jaw, nausea, shortness of breath, cold or clammy skin, feeling faint or lightheaded Heart failure--shortness of breath, swelling of the ankles, feet, or hands, sudden weight gain, unusual weakness or fatigue Heart rhythm changes--fast or irregular heartbeat, dizziness, feeling faint or lightheaded, chest pain, trouble breathing High ammonia level--unusual weakness or fatigue, confusion, loss of appetite, nausea, vomiting, seizures Infection--fever, chills, cough, sore throat, wounds that don't heal, pain or trouble when passing urine, general feeling of discomfort or being unwell Low red blood cell level--unusual weakness or fatigue, dizziness, headache, trouble breathing Pain, tingling, or numbness in the hands or feet, muscle weakness, change in vision, confusion or trouble speaking, loss of balance or coordination, trouble walking, seizures Redness, swelling, and blistering of the skin over hands and feet Severe or prolonged diarrhea Unusual bruising or bleeding Side effects that usually do not require medical attention (report to your care team if they continue or are bothersome): Dry skin Headache Increased tears Nausea Pain, redness, or swelling with sores inside the mouth or throat Sensitivity to light Vomiting This list may not describe all possible side effects. Call your doctor for medical advice about side effects. You may report side effects to FDA at 1-800-FDA-1088. Where should I keep my medication? This medication is given in a hospital or clinic. It will not be stored at home. NOTE: This sheet is a summary. It may not cover  all possible information. If you have questions about this medicine, talk to your doctor, pharmacist, or health care provider.  2024 Elsevier/Gold Standard (2022-04-10 00:00:00)

## 2023-08-15 ENCOUNTER — Encounter: Payer: Self-pay | Admitting: Oncology

## 2023-08-15 LAB — CANCER ANTIGEN 19-9: CA 19-9: 858 U/mL — ABNORMAL HIGH (ref 0–35)

## 2023-08-16 ENCOUNTER — Other Ambulatory Visit: Payer: Self-pay

## 2023-08-16 ENCOUNTER — Inpatient Hospital Stay: Payer: 59

## 2023-08-16 VITALS — BP 126/82 | HR 86 | Temp 98.1°F | Resp 18

## 2023-08-16 DIAGNOSIS — C252 Malignant neoplasm of tail of pancreas: Secondary | ICD-10-CM | POA: Diagnosis not present

## 2023-08-16 DIAGNOSIS — C259 Malignant neoplasm of pancreas, unspecified: Secondary | ICD-10-CM

## 2023-08-16 MED ORDER — SODIUM CHLORIDE 0.9% FLUSH
10.0000 mL | INTRAVENOUS | Status: DC | PRN
  Administered 2023-08-16: 10 mL

## 2023-08-16 MED ORDER — HEPARIN SOD (PORK) LOCK FLUSH 100 UNIT/ML IV SOLN
500.0000 [IU] | Freq: Once | INTRAVENOUS | Status: AC | PRN
  Administered 2023-08-16: 500 [IU]

## 2023-08-16 MED ORDER — PEGFILGRASTIM-JMDB 6 MG/0.6ML ~~LOC~~ SOSY
6.0000 mg | PREFILLED_SYRINGE | Freq: Once | SUBCUTANEOUS | Status: AC
  Administered 2023-08-16: 6 mg via SUBCUTANEOUS
  Filled 2023-08-16: qty 0.6

## 2023-08-16 NOTE — Patient Instructions (Signed)

## 2023-08-23 ENCOUNTER — Encounter: Payer: Self-pay | Admitting: *Deleted

## 2023-08-25 ENCOUNTER — Other Ambulatory Visit: Payer: Self-pay | Admitting: Oncology

## 2023-08-25 DIAGNOSIS — C259 Malignant neoplasm of pancreas, unspecified: Secondary | ICD-10-CM

## 2023-08-27 ENCOUNTER — Other Ambulatory Visit: Payer: BC Managed Care – PPO

## 2023-08-27 ENCOUNTER — Ambulatory Visit: Payer: BC Managed Care – PPO | Admitting: Oncology

## 2023-08-28 ENCOUNTER — Inpatient Hospital Stay (HOSPITAL_BASED_OUTPATIENT_CLINIC_OR_DEPARTMENT_OTHER): Payer: 59 | Admitting: Nurse Practitioner

## 2023-08-28 ENCOUNTER — Inpatient Hospital Stay: Payer: 59

## 2023-08-28 ENCOUNTER — Ambulatory Visit: Payer: BC Managed Care – PPO

## 2023-08-28 ENCOUNTER — Encounter: Payer: Self-pay | Admitting: Nurse Practitioner

## 2023-08-28 ENCOUNTER — Ambulatory Visit: Payer: BC Managed Care – PPO | Admitting: Nurse Practitioner

## 2023-08-28 ENCOUNTER — Inpatient Hospital Stay: Payer: 59 | Attending: Hematology

## 2023-08-28 VITALS — BP 134/86 | HR 83 | Temp 98.1°F | Resp 18 | Ht 68.0 in | Wt 158.4 lb

## 2023-08-28 DIAGNOSIS — Z5189 Encounter for other specified aftercare: Secondary | ICD-10-CM | POA: Insufficient documentation

## 2023-08-28 DIAGNOSIS — C259 Malignant neoplasm of pancreas, unspecified: Secondary | ICD-10-CM | POA: Diagnosis not present

## 2023-08-28 DIAGNOSIS — C787 Secondary malignant neoplasm of liver and intrahepatic bile duct: Secondary | ICD-10-CM | POA: Insufficient documentation

## 2023-08-28 DIAGNOSIS — C252 Malignant neoplasm of tail of pancreas: Secondary | ICD-10-CM | POA: Diagnosis present

## 2023-08-28 DIAGNOSIS — Z800 Family history of malignant neoplasm of digestive organs: Secondary | ICD-10-CM | POA: Diagnosis not present

## 2023-08-28 DIAGNOSIS — Z5111 Encounter for antineoplastic chemotherapy: Secondary | ICD-10-CM | POA: Insufficient documentation

## 2023-08-28 DIAGNOSIS — R066 Hiccough: Secondary | ICD-10-CM | POA: Diagnosis not present

## 2023-08-28 LAB — CBC WITH DIFFERENTIAL (CANCER CENTER ONLY)
Abs Immature Granulocytes: 0.16 10*3/uL — ABNORMAL HIGH (ref 0.00–0.07)
Basophils Absolute: 0 10*3/uL (ref 0.0–0.1)
Basophils Relative: 1 %
Eosinophils Absolute: 0.1 10*3/uL (ref 0.0–0.5)
Eosinophils Relative: 1 %
HCT: 37.3 % — ABNORMAL LOW (ref 39.0–52.0)
Hemoglobin: 12.7 g/dL — ABNORMAL LOW (ref 13.0–17.0)
Immature Granulocytes: 2 %
Lymphocytes Relative: 15 %
Lymphs Abs: 1.2 10*3/uL (ref 0.7–4.0)
MCH: 31.2 pg (ref 26.0–34.0)
MCHC: 34 g/dL (ref 30.0–36.0)
MCV: 91.6 fL (ref 80.0–100.0)
Monocytes Absolute: 0.8 10*3/uL (ref 0.1–1.0)
Monocytes Relative: 10 %
Neutro Abs: 5.7 10*3/uL (ref 1.7–7.7)
Neutrophils Relative %: 71 %
Platelet Count: 169 10*3/uL (ref 150–400)
RBC: 4.07 MIL/uL — ABNORMAL LOW (ref 4.22–5.81)
RDW: 17 % — ABNORMAL HIGH (ref 11.5–15.5)
WBC Count: 8 10*3/uL (ref 4.0–10.5)
nRBC: 0 % (ref 0.0–0.2)

## 2023-08-28 LAB — MAGNESIUM: Magnesium: 1.7 mg/dL (ref 1.7–2.4)

## 2023-08-28 LAB — CMP (CANCER CENTER ONLY)
ALT: 46 U/L — ABNORMAL HIGH (ref 0–44)
AST: 25 U/L (ref 15–41)
Albumin: 4 g/dL (ref 3.5–5.0)
Alkaline Phosphatase: 224 U/L — ABNORMAL HIGH (ref 38–126)
Anion gap: 9 (ref 5–15)
BUN: 14 mg/dL (ref 6–20)
CO2: 26 mmol/L (ref 22–32)
Calcium: 8.7 mg/dL — ABNORMAL LOW (ref 8.9–10.3)
Chloride: 98 mmol/L (ref 98–111)
Creatinine: 0.81 mg/dL (ref 0.61–1.24)
GFR, Estimated: >60 mL/min (ref >60)
Glucose, Bld: 373 mg/dL — ABNORMAL HIGH (ref 70–99)
Potassium: 4.1 mmol/L (ref 3.5–5.1)
Sodium: 133 mmol/L — ABNORMAL LOW (ref 135–145)
Total Bilirubin: 0.5 mg/dL (ref 0.3–1.2)
Total Protein: 7.1 g/dL (ref 6.5–8.1)

## 2023-08-28 LAB — FERRITIN: Ferritin: 343 ng/mL — ABNORMAL HIGH (ref 24–336)

## 2023-08-28 MED ORDER — SODIUM CHLORIDE 0.9 % IV SOLN
150.0000 mg | Freq: Once | INTRAVENOUS | Status: AC
  Administered 2023-08-28: 150 mg via INTRAVENOUS
  Filled 2023-08-28: qty 150

## 2023-08-28 MED ORDER — PROCHLORPERAZINE MALEATE 10 MG PO TABS: 10.0000 mg | ORAL_TABLET | Freq: Four times a day (QID) | ORAL | 3 refills | Status: DC | PRN

## 2023-08-28 MED ORDER — DEXTROSE 5 % IV SOLN: Freq: Once | INTRAVENOUS | Status: AC

## 2023-08-28 MED ORDER — ATROPINE SULFATE 1 MG/ML IV SOLN
0.5000 mg | Freq: Once | INTRAVENOUS | Status: AC | PRN
  Administered 2023-08-28: 0.5 mg via INTRAVENOUS
  Filled 2023-08-28: qty 1

## 2023-08-28 MED ORDER — DEXAMETHASONE SODIUM PHOSPHATE 10 MG/ML IJ SOLN
5.0000 mg | Freq: Once | INTRAMUSCULAR | Status: AC
  Administered 2023-08-28: 5 mg via INTRAVENOUS
  Filled 2023-08-28: qty 1

## 2023-08-28 MED ORDER — SODIUM CHLORIDE 0.9 % IV SOLN
400.0000 mg/m2 | Freq: Once | INTRAVENOUS | Status: AC
  Administered 2023-08-28: 744 mg via INTRAVENOUS
  Filled 2023-08-28: qty 37.2

## 2023-08-28 MED ORDER — PALONOSETRON HCL INJECTION 0.25 MG/5ML
0.2500 mg | Freq: Once | INTRAVENOUS | Status: AC
  Administered 2023-08-28: 0.25 mg via INTRAVENOUS
  Filled 2023-08-28: qty 5

## 2023-08-28 MED ORDER — SODIUM CHLORIDE 0.9 % IV SOLN: 5.0000 mg | Freq: Once | INTRAVENOUS | Status: DC

## 2023-08-28 MED ORDER — SODIUM CHLORIDE 0.9 % IV SOLN
2400.0000 mg/m2 | INTRAVENOUS | Status: DC
  Administered 2023-08-28: 4450 mg via INTRAVENOUS
  Filled 2023-08-28: qty 89

## 2023-08-28 MED ORDER — SODIUM CHLORIDE 0.9 % IV SOLN
150.0000 mg/m2 | Freq: Once | INTRAVENOUS | Status: AC
  Administered 2023-08-28: 300 mg via INTRAVENOUS
  Filled 2023-08-28: qty 15

## 2023-08-28 MED ORDER — ONDANSETRON HCL 8 MG PO TABS: 8.0000 mg | ORAL_TABLET | Freq: Three times a day (TID) | ORAL | 3 refills | Status: DC | PRN

## 2023-08-28 MED ORDER — SODIUM CHLORIDE 0.9% FLUSH
10.0000 mL | INTRAVENOUS | Status: DC | PRN
  Administered 2023-08-28: 10 mL

## 2023-08-28 MED ORDER — OXALIPLATIN CHEMO INJECTION 100 MG/20ML
65.0000 mg/m2 | Freq: Once | INTRAVENOUS | Status: AC
  Administered 2023-08-28: 120 mg via INTRAVENOUS
  Filled 2023-08-28: qty 20

## 2023-08-28 NOTE — Patient Instructions (Signed)

## 2023-08-28 NOTE — Progress Notes (Signed)
[facility] OFFICE PROGRESS NOTE   Diagnosis: Pancreas cancer  INTERVAL HISTORY:   David [name] returns as scheduled.  He completed cycle 5 FOLFIRINOX 08/14/2023.  Oxaliplatin and Decadron dose reduced.  He developed hiccups after treatment lasting 4 days.  Not as severe as with previous cycles.  He took Reglan.  He had a single loose stool.  No nausea or vomiting.  No mouth sores but did note a few areas of tenderness in his mouth.  Cold sensitivity for the first few days.  No numbness or tingling in the absence of cold exposure.  Objective:  Vital signs in last 24 hours:  Blood pressure 134/86, pulse 83, temperature 98.1 F (36.7 C), temperature source Oral, resp. rate 18, height 5\' 8"  (1.727 m), weight 158 lb 6.4 oz (71.8 kg), SpO2 98%.    HEENT: No thrush or ulcers. Resp: Lungs clear bilaterally. Cardio: Regular rate and rhythm. GI: Abdomen soft and nontender.  No hepatosplenomegaly. Vascular: No leg edema. Neuro: Vibratory sense intact over the fingertips for tuning fork exam. Skin: Palms without erythema. Port-A-Cath without erythema.  Lab Results:  Lab Results  Component Value Date   WBC 6.2 08/14/2023   HGB 12.5 (L) 08/14/2023   HCT 37.1 (L) 08/14/2023   MCV 90.0 08/14/2023   PLT 266 08/14/2023   NEUTROABS 3.4 08/14/2023    Imaging:  No results found.  Medications: I have reviewed the patient's current medications.  Assessment/Plan: Pancreas cancer CT angiogram chest 04/24/2023-low-density mass in the dome of the liver with at least 3 additional lesions in both hepatic lobes, borderline lymphadenopathy in the hepatoduodenal ligament Right upper quadrant ultrasound 04/24/2023-large circumscribed mass of the liver dome CT abdomen/pelvis 04/24/2023-multiple liver lesions poorly defined on noncontrast exam, prominent portacaval and porta hepatis nodes MRI abdomen 05/05/2023-multiple rim-hypoenhancing liver lesions consistent with hepatic metastases,  suspicion for a pancreas tail mass Ultrasound-guided biopsy of dominant right liver lesion 05/10/2023-adenocarcinoma, CK7 and CDX2 positive, abundant cytoplasm and mucin with extracellular mucin, foci of lymphovascular invasion Tempus gene panel-K-ras G12V, T P53 mild tumor mutation burden 3.2, MSS PET 06/04/2023-multiple hypermetabolic liver lesions consistent with metastases, low-level FDG uptake in the soft tissue fullness at the tip of the pancreas tail, low-level FDG activity involving a small soft tissue nodule between the gastric fundus and spleen, tiny foci of accumulation at the right costovertebral junction at T10 and roof of the left acetabulum without an underlying CT lesion Evaded CEA and CA 19-9 EUS 06/07/2023 ,22x 15 mm irregular mass in the tail the pancreas, 11 mm subcarinal lymph node, FNA biopsy of the pancreas mass-suspicious for malignancy Cycle 1 FOLFIRINOX 06/19/2023 Cycle 2 FOLFIRINOX 07/03/2023 Cycle 3 FOLFIRINOX 07/17/2023 Cycle 4 FOLFIRINOX 07/31/2023 Cycle 5 FOLFIRINOX 08/14/2023, oxaliplatin and Decadron dose reduced Cycle 6 FOLFIRINOX 08/28/2023 Family history of pancreas cancerINVITAE panel 05/17/2023-NF1 VUS Severe hiccups following cycle 1 FOLFIRINOX, did not respond to baclofen or gabapentin.  Resolved with Reglan.  Recurrent hiccups following cycle 4 FOLFIRINOX-not relieved with Reglan    Disposition: David [name] appears stable.  He has completed 5 cycles of FOLFIRINOX.  He again had hiccups, not as severe as with previous cycles.  Otherwise he is tolerating treatment well.  Plan to proceed with cycle 6 today as scheduled.  He is scheduled for restaging at [facility] on 09/16/2023.  He and his family expressed interest in alternative therapies.  They would like to discuss this further once the restaging results are available.  CBC reviewed.  Counts adequate to proceed with  treatment.  He will return for follow-up and treatment in 2 weeks.  He will contact the office in the interim  with any problems.    David [name] ANP/GNP-BC   08/28/2023  8:46 AM

## 2023-08-28 NOTE — Progress Notes (Signed)
Patient seen by Lisa Thomas NP today  Vitals are within treatment parameters.  Labs reviewed by Lisa Thomas NP and are within treatment parameters.  Per physician team, patient is ready for treatment and there are NO modifications to the treatment plan.     

## 2023-08-28 NOTE — Patient Instructions (Signed)
[facility] [facility]  Discharge Instructions: Thank you for choosing [facility] to provide your oncology and hematology care.   If you have a lab appointment with the Cancer Center, please go directly to the Cancer Center and check in at the registration area.   Wear comfortable clothing and clothing appropriate for easy access to any Portacath or PICC line.   We strive to give you quality time with your provider. You may need to reschedule your appointment if you arrive late (15 or more minutes).  Arriving late affects you and other patients whose appointments are after yours.  Also, if you miss three or more appointments without notifying the office, you may be dismissed from the clinic at the provider's discretion.      For prescription refill requests, have your pharmacy contact our office and allow 72 hours for refills to be completed.    Today you received the following chemotherapy and/or immunotherapy agents: oxaliplatin, irinotecan, leucovorin, fluorouracil.       To help prevent nausea and vomiting after your treatment, we encourage you to take your nausea medication as directed.  BELOW ARE SYMPTOMS THAT SHOULD BE REPORTED IMMEDIATELY: *FEVER GREATER THAN 100.4 F (38 C) OR HIGHER *CHILLS OR SWEATING *NAUSEA AND VOMITING THAT IS NOT CONTROLLED WITH YOUR NAUSEA MEDICATION *UNUSUAL SHORTNESS OF BREATH *UNUSUAL BRUISING OR BLEEDING *URINARY PROBLEMS (pain or burning when urinating, or frequent urination) *BOWEL PROBLEMS (unusual diarrhea, constipation, pain near the anus) TENDERNESS IN MOUTH AND THROAT WITH OR WITHOUT PRESENCE OF ULCERS (sore throat, sores in mouth, or a toothache) UNUSUAL RASH, SWELLING OR PAIN  UNUSUAL VAGINAL DISCHARGE OR ITCHING   Items with * indicate a potential emergency and should be followed up as soon as possible or go to the Emergency Department if any problems should occur.  Please show the CHEMOTHERAPY ALERT  CARD or IMMUNOTHERAPY ALERT CARD at check-in to the Emergency Department and triage nurse.  Should you have questions after your visit or need to cancel or reschedule your appointment, please contact [place] [facility]  Dept: [phone]  and follow the prompts.  Office hours are 8:00 a.m. to 4:30 p.m. Monday - Friday. Please note that voicemails left after 4:00 p.m. may not be returned until the following business day.  We are closed weekends and major holidays. You have access to a nurse at all times for urgent questions. Please call the main number to the clinic Dept: [phone] and follow the prompts.   For any non-urgent questions, you may also contact your provider using MyChart. We now offer e-Visits for anyone 58 and older to request care online for non-urgent symptoms. For details visit [email or web address].   Also download the MyChart app! Go to the app store, search "MyChart", open the app, select [place], and log in with your MyChart username and password.  Oxaliplatin Injection What is this medication? OXALIPLATIN (ox AL i PLA tin) treats colorectal cancer. It works by slowing down the growth of cancer cells. This medicine may be used for other purposes; ask your health care provider or pharmacist if you have questions. COMMON BRAND NAME(S): Eloxatin What should I tell my care team before I take this medication? They need to know if you have any of these conditions: Heart disease History of irregular heartbeat or rhythm Liver disease Low blood cell levels (white cells, red cells, and platelets) Lung or breathing disease, such as asthma Take medications that treat or prevent  blood clots Tingling of the fingers, toes, or other nerve disorder An unusual or allergic reaction to oxaliplatin, other medications, foods, dyes, or preservatives If you or your partner are pregnant or trying to get pregnant Breast-feeding How should I use this  medication? This medication is injected into a vein. It is given by your care team in a hospital or clinic setting. Talk to your care team about the use of this medication in children. Special care may be needed. Overdosage: If you think you have taken too much of this medicine contact a poison control center or emergency room at once. NOTE: This medicine is only for you. Do not share this medicine with others. What if I miss a dose? Keep appointments for follow-up doses. It is important not to miss a dose. Call your care team if you are unable to keep an appointment. What may interact with this medication? Do not take this medication with any of the following: Cisapride Dronedarone Pimozide Thioridazine This medication may also interact with the following: Aspirin and aspirin-like medications Certain medications that treat or prevent blood clots, such as warfarin, apixaban, dabigatran, and rivaroxaban Cisplatin Cyclosporine Diuretics Medications for infection, such as acyclovir, adefovir, amphotericin B, bacitracin, cidofovir, foscarnet, ganciclovir, gentamicin, pentamidine, vancomycin NSAIDs, medications for pain and inflammation, such as ibuprofen or naproxen Other medications that cause heart rhythm changes Pamidronate Zoledronic acid This list may not describe all possible interactions. Give your health care provider a list of all the medicines, herbs, non-prescription drugs, or dietary supplements you use. Also tell them if you smoke, drink alcohol, or use illegal drugs. Some items may interact with your medicine. What should I watch for while using this medication? Your condition will be monitored carefully while you are receiving this medication. You may need blood work while taking this medication. This medication may make you feel generally unwell. This is not uncommon as chemotherapy can affect healthy cells as well as cancer cells. Report any side effects. Continue your course  of treatment even though you feel ill unless your care team tells you to stop. This medication may increase your risk of getting an infection. Call your care team for advice if you get a fever, chills, sore throat, or other symptoms of a cold or flu. Do not treat yourself. Try to avoid being around people who are sick. Avoid taking medications that contain aspirin, acetaminophen, ibuprofen, naproxen, or ketoprofen unless instructed by your care team. These medications may hide a fever. Be careful brushing or flossing your teeth or using a toothpick because you may get an infection or bleed more easily. If you have any dental work done, tell your dentist you are receiving this medication. This medication can make you more sensitive to cold. Do not drink cold drinks or use ice. Cover exposed skin before coming in contact with cold temperatures or cold objects. When out in cold weather wear warm clothing and cover your mouth and nose to warm the air that goes into your lungs. Tell your care team if you get sensitive to the cold. Talk to your care team if you or your partner are pregnant or think either of you might be pregnant. This medication can cause serious birth defects if taken during pregnancy and for 9 months after the last dose. A negative pregnancy test is required before starting this medication. A reliable form of contraception is recommended while taking this medication and for 9 months after the last dose. Talk to your care team about   effective forms of contraception. Do not father a child while taking this medication and for 6 months after the last dose. Use a condom while having sex during this time period. Do not breastfeed while taking this medication and for 3 months after the last dose. This medication may cause infertility. Talk to your care team if you are concerned about your fertility. What side effects may I notice from receiving this medication? Side effects that you should report to  your care team as soon as possible: Allergic reactions--skin rash, itching, hives, swelling of the face, lips, tongue, or throat Bleeding--bloody or black, tar-like stools, vomiting blood or brown material that looks like coffee grounds, red or dark brown urine, small red or purple spots on skin, unusual bruising or bleeding Dry cough, shortness of breath or trouble breathing Heart rhythm changes--fast or irregular heartbeat, dizziness, feeling faint or lightheaded, chest pain, trouble breathing Infection--fever, chills, cough, sore throat, wounds that don't heal, pain or trouble when passing urine, general feeling of discomfort or being unwell Liver injury--right upper belly pain, loss of appetite, nausea, light-colored stool, dark yellow or brown urine, yellowing skin or eyes, unusual weakness or fatigue Low red blood cell level--unusual weakness or fatigue, dizziness, headache, trouble breathing Muscle injury--unusual weakness or fatigue, muscle pain, dark yellow or brown urine, decrease in amount of urine Pain, tingling, or numbness in the hands or feet Sudden and severe headache, confusion, change in vision, seizures, which may be signs of posterior reversible encephalopathy syndrome (PRES) Unusual bruising or bleeding Side effects that usually do not require medical attention (report to your care team if they continue or are bothersome): Diarrhea Nausea Pain, redness, or swelling with sores inside the mouth or throat Unusual weakness or fatigue Vomiting This list may not describe all possible side effects. Call your doctor for medical advice about side effects. You may report side effects to FDA at 1-800-FDA-1088. Where should I keep my medication? This medication is given in a hospital or clinic. It will not be stored at home. NOTE: This sheet is a summary. It may not cover all possible information. If you have questions about this medicine, talk to your doctor, pharmacist, or health care  provider.  2024 Elsevier/Gold Standard (2023-02-10 00:00:00) Irinotecan Injection What is this medication? IRINOTECAN (ir in oh TEE kan) treats some types of cancer. It works by slowing down the growth of cancer cells. This medicine may be used for other purposes; ask your health care provider or pharmacist if you have questions. COMMON BRAND NAME(S): Camptosar What should I tell my care team before I take this medication? They need to know if you have any of these conditions: Dehydration Diarrhea Infection, especially a viral infection, such as chickenpox, cold sores, herpes Liver disease Low blood cell levels (white cells, red cells, and platelets) Low levels of electrolytes, such as calcium, magnesium, or potassium in your blood Recent or ongoing radiation An unusual or allergic reaction to irinotecan, other medications, foods, dyes, or preservatives If you or your partner are pregnant or trying to get pregnant Breast-feeding How should I use this medication? This medication is injected into a vein. It is given by your care team in a hospital or clinic setting. Talk to your care team about the use of this medication in children. Special care may be needed. Overdosage: If you think you have taken too much of this medicine contact a poison control center or emergency room at once. NOTE: This medicine is only for you. Do  not share this medicine with others. What if I miss a dose? Keep appointments for follow-up doses. It is important not to miss your dose. Call your care team if you are unable to keep an appointment. What may interact with this medication? Do not take this medication with any of the following: Cobicistat Itraconazole This medication may also interact with the following: Certain antibiotics, such as clarithromycin, rifampin, rifabutin Certain antivirals for HIV or AIDS Certain medications for fungal infections, such as ketoconazole, posaconazole,  voriconazole Certain medications for seizures, such as carbamazepine, phenobarbital, phenytoin Gemfibrozil Nefazodone St. John's wort This list may not describe all possible interactions. Give your health care provider a list of all the medicines, herbs, non-prescription drugs, or dietary supplements you use. Also tell them if you smoke, drink alcohol, or use illegal drugs. Some items may interact with your medicine. What should I watch for while using this medication? Your condition will be monitored carefully while you are receiving this medication. You may need blood work while taking this medication. This medication may make you feel generally unwell. This is not uncommon as chemotherapy can affect healthy cells as well as cancer cells. Report any side effects. Continue your course of treatment even though you feel ill unless your care team tells you to stop. This medication can cause serious side effects. To reduce the risk, your care team may give you other medications to take before receiving this one. Be sure to follow the directions from your care team. This medication may affect your coordination, reaction time, or judgement. Do not drive or operate machinery until you know how this medication affects you. Sit up or stand slowly to reduce the risk of dizzy or fainting spells. Drinking alcohol with this medication can increase the risk of these side effects. This medication may increase your risk of getting an infection. Call your care team for advice if you get a fever, chills, sore throat, or other symptoms of a cold or flu. Do not treat yourself. Try to avoid being around people who are sick. Avoid taking medications that contain aspirin, acetaminophen, ibuprofen, naproxen, or ketoprofen unless instructed by your care team. These medications may hide a fever. This medication may increase your risk to bruise or bleed. Call your care team if you notice any unusual bleeding. Be careful  brushing or flossing your teeth or using a toothpick because you may get an infection or bleed more easily. If you have any dental work done, tell your dentist you are receiving this medication. Talk to your care team if you or your partner are pregnant or think either of you might be pregnant. This medication can cause serious birth defects if taken during pregnancy and for 6 months after the last dose. You will need a negative pregnancy test before starting this medication. Contraception is recommended while taking this medication and for 6 months after the last dose. Your care team can help you find the option that works for you. Do not father a child while taking this medication and for 3 months after the last dose. Use a condom for contraception during this time period. Do not breastfeed while taking this medication and for 7 days after the last dose. This medication may cause infertility. Talk to your care team if you are concerned about your fertility. What side effects may I notice from receiving this medication? Side effects that you should report to your care team as soon as possible: Allergic reactions--skin rash, itching, hives, swelling of  the face, lips, tongue, or throat Dry cough, shortness of breath or trouble breathing Increased saliva or tears, increased sweating, stomach cramping, diarrhea, small pupils, unusual weakness or fatigue, slow heartbeat Infection--fever, chills, cough, sore throat, wounds that don't heal, pain or trouble when passing urine, general feeling of discomfort or being unwell Kidney injury--decrease in the amount of urine, swelling of the ankles, hands, or feet Low red blood cell level--unusual weakness or fatigue, dizziness, headache, trouble breathing Severe or prolonged diarrhea Unusual bruising or bleeding Side effects that usually do not require medical attention (report to your care team if they continue or are bothersome): Constipation Diarrhea Hair  loss Loss of appetite Nausea Stomach pain This list may not describe all possible side effects. Call your doctor for medical advice about side effects. You may report side effects to FDA at 1-800-FDA-1088. Where should I keep my medication? This medication is given in a hospital or clinic. It will not be stored at home. NOTE: This sheet is a summary. It may not cover all possible information. If you have questions about this medicine, talk to your doctor, pharmacist, or health care provider.  2024 Elsevier/Gold Standard (2022-04-16 00:00:00) Leucovorin Injection What is this medication? LEUCOVORIN (loo koe VOR in) prevents side effects from certain medications, such as methotrexate. It works by increasing folate levels. This helps protect healthy cells in your body. It may also be used to treat anemia caused by low levels of folate. It can also be used with fluorouracil, a type of chemotherapy, to treat colorectal cancer. It works by increasing the effects of fluorouracil in the body. This medicine may be used for other purposes; ask your health care provider or pharmacist if you have questions. What should I tell my care team before I take this medication? They need to know if you have any of these conditions: Anemia from low levels of vitamin B12 in the blood An unusual or allergic reaction to leucovorin, folic acid, other medications, foods, dyes, or preservatives Pregnant or trying to get pregnant Breastfeeding How should I use this medication? This medication is injected into a vein or a muscle. It is given by your care team in a hospital or clinic setting. Talk to your care team about the use of this medication in children. Special care may be needed. Overdosage: If you think you have taken too much of this medicine contact a poison control center or emergency room at once. NOTE: This medicine is only for you. Do not share this medicine with others. What if I miss a dose? Keep  appointments for follow-up doses. It is important not to miss your dose. Call your care team if you are unable to keep an appointment. What may interact with this medication? Capecitabine Fluorouracil Phenobarbital Phenytoin Primidone Trimethoprim;sulfamethoxazole This list may not describe all possible interactions. Give your health care provider a list of all the medicines, herbs, non-prescription drugs, or dietary supplements you use. Also tell them if you smoke, drink alcohol, or use illegal drugs. Some items may interact with your medicine. What should I watch for while using this medication? Your condition will be monitored carefully while you are receiving this medication. This medication may increase the side effects of 5-fluorouracil. Tell your care team if you have diarrhea or mouth sores that do not get better or that get worse. What side effects may I notice from receiving this medication? Side effects that you should report to your care team as soon as possible: Allergic reactions--skin rash,  itching, hives, swelling of the face, lips, tongue, or throat This list may not describe all possible side effects. Call your doctor for medical advice about side effects. You may report side effects to FDA at 1-800-FDA-1088. Where should I keep my medication? This medication is given in a hospital or clinic. It will not be stored at home. NOTE: This sheet is a summary. It may not cover all possible information. If you have questions about this medicine, talk to your doctor, pharmacist, or health care provider.  2024 Elsevier/Gold Standard (2022-05-08 00:00:00) Fluorouracil Injection What is this medication? FLUOROURACIL (flure oh YOOR a sil) treats some types of cancer. It works by slowing down the growth of cancer cells. This medicine may be used for other purposes; ask your health care provider or pharmacist if you have questions. COMMON BRAND NAME(S): Adrucil What should I tell my care  team before I take this medication? They need to know if you have any of these conditions: Blood disorders Dihydropyrimidine dehydrogenase (DPD) deficiency Infection, such as chickenpox, cold sores, herpes Kidney disease Liver disease Poor nutrition Recent or ongoing radiation therapy An unusual or allergic reaction to fluorouracil, other medications, foods, dyes, or preservatives If you or your partner are pregnant or trying to get pregnant Breast-feeding How should I use this medication? This medication is injected into a vein. It is administered by your care team in a hospital or clinic setting. Talk to your care team about the use of this medication in children. Special care may be needed. Overdosage: If you think you have taken too much of this medicine contact a poison control center or emergency room at once. NOTE: This medicine is only for you. Do not share this medicine with others. What if I miss a dose? Keep appointments for follow-up doses. It is important not to miss your dose. Call your care team if you are unable to keep an appointment. What may interact with this medication? Do not take this medication with any of the following: Live virus vaccines This medication may also interact with the following: Medications that treat or prevent blood clots, such as warfarin, enoxaparin, dalteparin This list may not describe all possible interactions. Give your health care provider a list of all the medicines, herbs, non-prescription drugs, or dietary supplements you use. Also tell them if you smoke, drink alcohol, or use illegal drugs. Some items may interact with your medicine. What should I watch for while using this medication? Your condition will be monitored carefully while you are receiving this medication. This medication may make you feel generally unwell. This is not uncommon as chemotherapy can affect healthy cells as well as cancer cells. Report any side effects. Continue  your course of treatment even though you feel ill unless your care team tells you to stop. In some cases, you may be given additional medications to help with side effects. Follow all directions for their use. This medication may increase your risk of getting an infection. Call your care team for advice if you get a fever, chills, sore throat, or other symptoms of a cold or flu. Do not treat yourself. Try to avoid being around people who are sick. This medication may increase your risk to bruise or bleed. Call your care team if you notice any unusual bleeding. Be careful brushing or flossing your teeth or using a toothpick because you may get an infection or bleed more easily. If you have any dental work done, tell your dentist you are receiving  this medication. Avoid taking medications that contain aspirin, acetaminophen, ibuprofen, naproxen, or ketoprofen unless instructed by your care team. These medications may hide a fever. Do not treat diarrhea with over the counter products. Contact your care team if you have diarrhea that lasts more than 2 days or if it is severe and watery. This medication can make you more sensitive to the sun. Keep out of the sun. If you cannot avoid being in the sun, wear protective clothing and sunscreen. Do not use sun lamps, tanning beds, or tanning booths. Talk to your care team if you or your partner wish to become pregnant or think you might be pregnant. This medication can cause serious birth defects if taken during pregnancy and for 3 months after the last dose. A reliable form of contraception is recommended while taking this medication and for 3 months after the last dose. Talk to your care team about effective forms of contraception. Do not father a child while taking this medication and for 3 months after the last dose. Use a condom while having sex during this time period. Do not breastfeed while taking this medication. This medication may cause infertility. Talk  to your care team if you are concerned about your fertility. What side effects may I notice from receiving this medication? Side effects that you should report to your care team as soon as possible: Allergic reactions--skin rash, itching, hives, swelling of the face, lips, tongue, or throat Heart attack--pain or tightness in the chest, shoulders, arms, or jaw, nausea, shortness of breath, cold or clammy skin, feeling faint or lightheaded Heart failure--shortness of breath, swelling of the ankles, feet, or hands, sudden weight gain, unusual weakness or fatigue Heart rhythm changes--fast or irregular heartbeat, dizziness, feeling faint or lightheaded, chest pain, trouble breathing High ammonia level--unusual weakness or fatigue, confusion, loss of appetite, nausea, vomiting, seizures Infection--fever, chills, cough, sore throat, wounds that don't heal, pain or trouble when passing urine, general feeling of discomfort or being unwell Low red blood cell level--unusual weakness or fatigue, dizziness, headache, trouble breathing Pain, tingling, or numbness in the hands or feet, muscle weakness, change in vision, confusion or trouble speaking, loss of balance or coordination, trouble walking, seizures Redness, swelling, and blistering of the skin over hands and feet Severe or prolonged diarrhea Unusual bruising or bleeding Side effects that usually do not require medical attention (report to your care team if they continue or are bothersome): Dry skin Headache Increased tears Nausea Pain, redness, or swelling with sores inside the mouth or throat Sensitivity to light Vomiting This list may not describe all possible side effects. Call your doctor for medical advice about side effects. You may report side effects to FDA at 1-800-FDA-1088. Where should I keep my medication? This medication is given in a hospital or clinic. It will not be stored at home. NOTE: This sheet is a summary. It may not cover  all possible information. If you have questions about this medicine, talk to your doctor, pharmacist, or health care provider.  2024 Elsevier/Gold Standard (2022-04-10 00:00:00)

## 2023-08-29 ENCOUNTER — Telehealth: Payer: Self-pay

## 2023-08-29 NOTE — Telephone Encounter (Signed)
Patent gave verbal understanding and had no further questions or concerns

## 2023-08-29 NOTE — Telephone Encounter (Signed)
-----   Message from Lonna Cobb sent at 08/29/2023  1:47 PM EDT ----- Please let him know the magnesium level is normal.  Ferritin (measure of iron stores) is mildly elevated.

## 2023-08-30 ENCOUNTER — Other Ambulatory Visit: Payer: Self-pay

## 2023-08-30 ENCOUNTER — Inpatient Hospital Stay: Payer: 59

## 2023-08-30 VITALS — BP 125/85 | HR 90 | Temp 98.5°F | Resp 18

## 2023-08-30 DIAGNOSIS — C259 Malignant neoplasm of pancreas, unspecified: Secondary | ICD-10-CM

## 2023-08-30 DIAGNOSIS — Z5111 Encounter for antineoplastic chemotherapy: Secondary | ICD-10-CM | POA: Diagnosis not present

## 2023-08-30 LAB — CANCER ANTIGEN 19-9: CA 19-9: 890 U/mL — ABNORMAL HIGH (ref 0–35)

## 2023-08-30 MED ORDER — PEGFILGRASTIM-JMDB 6 MG/0.6ML ~~LOC~~ SOSY
6.0000 mg | PREFILLED_SYRINGE | Freq: Once | SUBCUTANEOUS | Status: AC
  Administered 2023-08-30: 6 mg via SUBCUTANEOUS
  Filled 2023-08-30: qty 0.6

## 2023-08-30 MED ORDER — SODIUM CHLORIDE 0.9% FLUSH
10.0000 mL | INTRAVENOUS | Status: DC | PRN
  Administered 2023-08-30: 10 mL

## 2023-08-30 MED ORDER — HEPARIN SOD (PORK) LOCK FLUSH 100 UNIT/ML IV SOLN
500.0000 [IU] | Freq: Once | INTRAVENOUS | Status: AC | PRN
  Administered 2023-08-30: 500 [IU]

## 2023-08-30 NOTE — Patient Instructions (Addendum)
Implanted [facility] Guide An implanted port is a device that is placed under the skin. It is usually placed in the chest. The device may vary based on the need. Implanted ports can be used to give IV medicine, to take blood, or to give fluids. You may have an implanted port if: You need IV medicine that would be irritating to the small veins in your hands or arms. You need IV medicines, such as chemotherapy, for a long period of time. You need IV nutrition for a long period of time. You may have fewer limitations when using a port than you would if you used other types of long-term IVs. You will also likely be able to return to normal activities after your incision heals. An implanted port has two main parts: Reservoir. The reservoir is the part where a needle is inserted to give medicines or draw blood. The reservoir is round. After the port is placed, it appears as a small, raised area under your skin. Catheter. The catheter is a small, thin tube that connects the reservoir to a vein. Medicine that is inserted into the reservoir goes into the catheter and then into the vein. How is my port accessed? To access your port: A numbing cream may be placed on the skin over the port site. Your health care provider will put on a mask and sterile gloves. The skin over your port will be cleaned carefully with a germ-killing soap and allowed to dry. Your health care provider will gently pinch the port and insert a needle into it. Your health care provider will check for a blood return to make sure the port is in the vein and is still working (patent). If your port needs to remain accessed to get medicine continuously (constant infusion), your health care provider will place a clear bandage (dressing) over the needle site. The dressing and needle will need to be changed every week, or as told by your health care provider. What is flushing? Flushing helps keep the port working. Follow instructions from your  health care provider about how and when to flush the port. Ports are usually flushed with saline solution or a medicine called heparin. The need for flushing will depend on how the port is used: If the port is only used from time to time to give medicines or draw blood, the port may need to be flushed: Before and after medicines have been given. Before and after blood has been drawn. As part of routine maintenance. Flushing may be recommended every 4-6 weeks. If a constant infusion is running, the port may not need to be flushed. Throw away any syringes in a disposal container that is meant for sharp items (sharps container). You can buy a sharps container from a pharmacy, or you can make one by using an empty hard plastic bottle with a cover. How long will my port stay implanted? The port can stay in for as long as your health care provider thinks it is needed. When it is time for the port to come out, a surgery will be done to remove it. The surgery will be similar to the procedure that was done to put the port in. Follow these instructions at home: Caring for your port and port site Flush your port as told by your health care provider. If you need an infusion over several days, follow instructions from your health care provider about how to take care of your port site. Make sure you: Change your  dressing as told by your health care provider. Wash your hands with soap and water for at least 20 seconds before and after you change your dressing. If soap and water are not available, use alcohol-based hand sanitizer. Place any used dressings or infusion bags into a plastic bag. Throw that bag in the trash. Keep the dressing that covers the needle clean and dry. Do not get it wet. Do not use scissors or sharp objects near the infusion tubing. Keep any external tubes clamped, unless they are being used. Check your port site every day for signs of infection. Check for: Redness, swelling, or  pain. Fluid or blood. Warmth. Pus or a bad smell. Protect the skin around the port site. Avoid wearing bra straps that rub or irritate the site. Protect the skin around your port from seat belts. Place a soft pad over your chest if needed. Bathe or shower as told by your health care provider. The site may get wet as long as you are not actively receiving an infusion. General instructions  Return to your normal activities as told by your health care provider. Ask your health care provider what activities are safe for you. Carry a medical alert card or wear a medical alert bracelet at all times. This will let health care providers know that you have an implanted port in case of an emergency. Where to find more information American Cancer Society: www.cancer.org American Society of Clinical Oncology: www.cancer.net Contact a health care provider if: You have a fever or chills. You have redness, swelling, or pain at the port site. You have fluid or blood coming from your port site. Your incision feels warm to the touch. You have pus or a bad smell coming from the port site. Summary Implanted ports are usually placed in the chest for long-term IV access. Follow instructions from your health care provider about flushing the port and changing bandages (dressings). Take care of the area around your port by avoiding clothing that puts pressure on the area, and by watching for signs of infection. Protect the skin around your port from seat belts. Place a soft pad over your chest if needed. Contact a health care provider if you have a fever or you have redness, swelling, pain, fluid, or a bad smell at the port site. This information is not intended to replace advice given to you by your health care provider. Make sure you discuss any questions you have with your health care provider. Document Revised: 06/06/2021 Document Reviewed: 06/06/2021 Elsevier Patient Education  2024 Elsevier  Inc.  Pegfilgrastim Injection What is this medication? PEGFILGRASTIM (PEG fil gra stim) lowers the risk of infection in people who are receiving chemotherapy. It works by Systems analyst make more white blood cells, which protects your body from infection. It may also be used to help people who have been exposed to high doses of radiation. This medicine may be used for other purposes; ask your health care provider or pharmacist if you have questions. COMMON BRAND NAME(S): Cherly Hensen, Neulasta, Nyvepria, Stimufend, UDENYCA, UDENYCA ONBODY, Ziextenzo What should I tell my care team before I take this medication? They need to know if you have any of these conditions: Kidney disease Latex allergy Ongoing radiation therapy Sickle cell disease Skin reactions to acrylic adhesives (On-Body Injector only) An unusual or allergic reaction to pegfilgrastim, filgrastim, other medications, foods, dyes, or preservatives Pregnant or trying to get pregnant Breast-feeding How should I use this medication? This medication is for injection  under the skin. If you get this medication at home, you will be taught how to prepare and give the pre-filled syringe or how to use the On-body Injector. Refer to the patient Instructions for Use for detailed instructions. Use exactly as directed. Tell your care team immediately if you suspect that the On-body Injector may not have performed as intended or if you suspect the use of the On-body Injector resulted in a missed or partial dose. It is important that you put your used needles and syringes in a special sharps container. Do not put them in a trash can. If you do not have a sharps container, call your pharmacist or care team to get one. Talk to your care team about the use of this medication in children. While this medication may be prescribed for selected conditions, precautions do apply. Overdosage: If you think you have taken too much of this medicine contact  a poison control center or emergency room at once. NOTE: This medicine is only for you. Do not share this medicine with others. What if I miss a dose? It is important not to miss your dose. Call your care team if you miss your dose. If you miss a dose due to an On-body Injector failure or leakage, a new dose should be administered as soon as possible using a single prefilled syringe for manual use. What may interact with this medication? Interactions have not been studied. This list may not describe all possible interactions. Give your health care provider a list of all the medicines, herbs, non-prescription drugs, or dietary supplements you use. Also tell them if you smoke, drink alcohol, or use illegal drugs. Some items may interact with your medicine. What should I watch for while using this medication? Your condition will be monitored carefully while you are receiving this medication. You may need blood work done while you are taking this medication. Talk to your care team about your risk of cancer. You may be more at risk for certain types of cancer if you take this medication. If you are going to need a MRI, CT scan, or other procedure, tell your care team that you are using this medication (On-Body Injector only). What side effects may I notice from receiving this medication? Side effects that you should report to your care team as soon as possible: Allergic reactions--skin rash, itching, hives, swelling of the face, lips, tongue, or throat Capillary leak syndrome--stomach or muscle pain, unusual weakness or fatigue, feeling faint or lightheaded, decrease in the amount of urine, swelling of the ankles, hands, or feet, trouble breathing High white blood cell level--fever, fatigue, trouble breathing, night sweats, change in vision, weight loss Inflammation of the aorta--fever, fatigue, back, chest, or stomach pain, severe headache Kidney injury (glomerulonephritis)--decrease in the amount of  urine, red or dark brown urine, foamy or bubbly urine, swelling of the ankles, hands, or feet Shortness of breath or trouble breathing Spleen injury--pain in upper left stomach or shoulder Unusual bruising or bleeding Side effects that usually do not require medical attention (report to your care team if they continue or are bothersome): Bone pain Pain in the hands or feet This list may not describe all possible side effects. Call your doctor for medical advice about side effects. You may report side effects to FDA at 1-800-FDA-1088. Where should I keep my medication? Keep out of the reach of children. If you are using this medication at home, you will be instructed on how to store it. Throw away  any unused medication after the expiration date on the label. NOTE: This sheet is a summary. It may not cover all possible information. If you have questions about this medicine, talk to your doctor, pharmacist, or health care provider.  2024 Elsevier/Gold Standard (2021-11-03 00:00:00)

## 2023-09-04 ENCOUNTER — Other Ambulatory Visit: Payer: Self-pay | Admitting: Oncology

## 2023-09-10 ENCOUNTER — Encounter: Payer: Self-pay | Admitting: Oncology

## 2023-09-11 ENCOUNTER — Inpatient Hospital Stay (HOSPITAL_BASED_OUTPATIENT_CLINIC_OR_DEPARTMENT_OTHER): Payer: 59 | Admitting: Nurse Practitioner

## 2023-09-11 ENCOUNTER — Inpatient Hospital Stay: Payer: 59

## 2023-09-11 ENCOUNTER — Encounter: Payer: Self-pay | Admitting: Nurse Practitioner

## 2023-09-11 VITALS — BP 144/88 | HR 87 | Temp 98.1°F | Resp 20 | Ht 68.0 in | Wt 159.5 lb

## 2023-09-11 DIAGNOSIS — C787 Secondary malignant neoplasm of liver and intrahepatic bile duct: Secondary | ICD-10-CM

## 2023-09-11 DIAGNOSIS — C259 Malignant neoplasm of pancreas, unspecified: Secondary | ICD-10-CM

## 2023-09-11 DIAGNOSIS — Z5111 Encounter for antineoplastic chemotherapy: Secondary | ICD-10-CM | POA: Diagnosis not present

## 2023-09-11 LAB — CBC WITH DIFFERENTIAL (CANCER CENTER ONLY)
Abs Immature Granulocytes: 0.1 10*3/uL — ABNORMAL HIGH (ref 0.00–0.07)
Basophils Absolute: 0 10*3/uL (ref 0.0–0.1)
Basophils Relative: 0 %
Eosinophils Absolute: 0.1 10*3/uL (ref 0.0–0.5)
Eosinophils Relative: 1 %
HCT: 37.5 % — ABNORMAL LOW (ref 39.0–52.0)
Hemoglobin: 12.8 g/dL — ABNORMAL LOW (ref 13.0–17.0)
Immature Granulocytes: 1 %
Lymphocytes Relative: 12 %
Lymphs Abs: 1.2 10*3/uL (ref 0.7–4.0)
MCH: 31.6 pg (ref 26.0–34.0)
MCHC: 34.1 g/dL (ref 30.0–36.0)
MCV: 92.6 fL (ref 80.0–100.0)
Monocytes Absolute: 0.9 10*3/uL (ref 0.1–1.0)
Monocytes Relative: 9 %
Neutro Abs: 7.5 10*3/uL (ref 1.7–7.7)
Neutrophils Relative %: 77 %
Platelet Count: 147 10*3/uL — ABNORMAL LOW (ref 150–400)
RBC: 4.05 MIL/uL — ABNORMAL LOW (ref 4.22–5.81)
RDW: 17.7 % — ABNORMAL HIGH (ref 11.5–15.5)
WBC Count: 9.8 10*3/uL (ref 4.0–10.5)
nRBC: 0 % (ref 0.0–0.2)

## 2023-09-11 LAB — CMP (CANCER CENTER ONLY)
ALT: 31 U/L (ref 0–44)
AST: 23 U/L (ref 15–41)
Albumin: 4 g/dL (ref 3.5–5.0)
Alkaline Phosphatase: 194 U/L — ABNORMAL HIGH (ref 38–126)
Anion gap: 7 (ref 5–15)
BUN: 12 mg/dL (ref 6–20)
CO2: 26 mmol/L (ref 22–32)
Calcium: 9.2 mg/dL (ref 8.9–10.3)
Chloride: 100 mmol/L (ref 98–111)
Creatinine: 0.73 mg/dL (ref 0.61–1.24)
GFR, Estimated: >60 mL/min (ref >60)
Glucose, Bld: 239 mg/dL — ABNORMAL HIGH (ref 70–99)
Potassium: 4 mmol/L (ref 3.5–5.1)
Sodium: 133 mmol/L — ABNORMAL LOW (ref 135–145)
Total Bilirubin: 0.5 mg/dL (ref 0.3–1.2)
Total Protein: 6.9 g/dL (ref 6.5–8.1)

## 2023-09-11 MED ORDER — DEXAMETHASONE SODIUM PHOSPHATE 10 MG/ML IJ SOLN
5.0000 mg | Freq: Once | INTRAMUSCULAR | Status: AC
  Administered 2023-09-11: 5 mg via INTRAVENOUS
  Filled 2023-09-11: qty 1

## 2023-09-11 MED ORDER — ATROPINE SULFATE 1 MG/ML IV SOLN
0.5000 mg | Freq: Once | INTRAVENOUS | Status: AC | PRN
  Administered 2023-09-11: 0.5 mg via INTRAVENOUS
  Filled 2023-09-11: qty 1

## 2023-09-11 MED ORDER — SODIUM CHLORIDE 0.9 % IV SOLN: 5.0000 mg | Freq: Once | INTRAVENOUS | Status: DC

## 2023-09-11 MED ORDER — SODIUM CHLORIDE 0.9 % IV SOLN
400.0000 mg/m2 | Freq: Once | INTRAVENOUS | Status: AC
  Administered 2023-09-11: 744 mg via INTRAVENOUS
  Filled 2023-09-11: qty 37.2

## 2023-09-11 MED ORDER — PALONOSETRON HCL INJECTION 0.25 MG/5ML
0.2500 mg | Freq: Once | INTRAVENOUS | Status: AC
  Administered 2023-09-11: 0.25 mg via INTRAVENOUS
  Filled 2023-09-11: qty 5

## 2023-09-11 MED ORDER — SODIUM CHLORIDE 0.9 % IV SOLN
150.0000 mg/m2 | Freq: Once | INTRAVENOUS | Status: AC
  Administered 2023-09-11: 300 mg via INTRAVENOUS
  Filled 2023-09-11: qty 15

## 2023-09-11 MED ORDER — OXALIPLATIN CHEMO INJECTION 100 MG/20ML
65.0000 mg/m2 | Freq: Once | INTRAVENOUS | Status: AC
  Administered 2023-09-11: 120 mg via INTRAVENOUS
  Filled 2023-09-11: qty 20

## 2023-09-11 MED ORDER — SODIUM CHLORIDE 0.9 % IV SOLN
2400.0000 mg/m2 | INTRAVENOUS | Status: DC
  Administered 2023-09-11: 4450 mg via INTRAVENOUS
  Filled 2023-09-11: qty 89

## 2023-09-11 MED ORDER — DEXTROSE 5 % IV SOLN: Freq: Once | INTRAVENOUS | Status: AC

## 2023-09-11 MED ORDER — SODIUM CHLORIDE 0.9 % IV SOLN
150.0000 mg | Freq: Once | INTRAVENOUS | Status: AC
  Administered 2023-09-11: 150 mg via INTRAVENOUS
  Filled 2023-09-11: qty 150

## 2023-09-11 NOTE — Progress Notes (Signed)
  [facility] OFFICE PROGRESS NOTE   Diagnosis: Pancreas cancer  INTERVAL HISTORY:   David [name] returns as scheduled.  He completed cycle 6 FOLFIRINOX 08/28/2023.  He had hiccups lasting 2 days.  No nausea.  Single loose stool.  No mouth sores.  Cold sensitivity lasted 2 or 3 days.  No persistent neuropathy symptoms.  Objective:  Vital signs in last 24 hours:  Blood pressure (!) 144/88, pulse 87, temperature 98.1 F (36.7 C), temperature source Oral, resp. rate 20, height 5\' 8"  (1.727 m), weight 159 lb 8 oz (72.3 kg), SpO2 99%.    HEENT: No thrush or ulcers. Resp: Lungs clear bilaterally. Cardio: Regular rate and rhythm. GI: No hepatosplenomegaly. Vascular: No leg edema. Neuro: Vibratory sense mildly decreased over the fingertips per tuning fork exam. Skin: Palms without erythema. Port-A-Cath without erythema.  Lab Results:  Lab Results  Component Value Date   WBC 9.8 09/11/2023   HGB 12.8 (L) 09/11/2023   HCT 37.5 (L) 09/11/2023   MCV 92.6 09/11/2023   PLT 147 (L) 09/11/2023   NEUTROABS 7.5 09/11/2023    Imaging:  No results found.  Medications: I have reviewed the patient's current medications.  Assessment/Plan: Pancreas cancer CT angiogram chest 04/24/2023-low-density mass in the dome of the liver with at least 3 additional lesions in both hepatic lobes, borderline lymphadenopathy in the hepatoduodenal ligament Right upper quadrant ultrasound 04/24/2023-large circumscribed mass of the liver dome CT abdomen/pelvis 04/24/2023-multiple liver lesions poorly defined on noncontrast exam, prominent portacaval and porta hepatis nodes MRI abdomen 05/05/2023-multiple rim-hypoenhancing liver lesions consistent with hepatic metastases, suspicion for a pancreas tail mass Ultrasound-guided biopsy of dominant right liver lesion 05/10/2023-adenocarcinoma, CK7 and CDX2 positive, abundant cytoplasm and mucin with extracellular mucin, foci of lymphovascular invasion Tempus  gene panel-K-ras G12V, T P53 mild tumor mutation burden 3.2, MSS PET 06/04/2023-multiple hypermetabolic liver lesions consistent with metastases, low-level FDG uptake in the soft tissue fullness at the tip of the pancreas tail, low-level FDG activity involving a small soft tissue nodule between the gastric fundus and spleen, tiny foci of accumulation at the right costovertebral junction at T10 and roof of the left acetabulum without an underlying CT lesion Evaded CEA and CA 19-9 EUS 06/07/2023 ,22x 15 mm irregular mass in the tail the pancreas, 11 mm subcarinal lymph node, FNA biopsy of the pancreas mass-suspicious for malignancy Cycle 1 FOLFIRINOX 06/19/2023 Cycle 2 FOLFIRINOX 07/03/2023 Cycle 3 FOLFIRINOX 07/17/2023 Cycle 4 FOLFIRINOX 07/31/2023 Cycle 5 FOLFIRINOX 08/14/2023, oxaliplatin and Decadron dose reduced Cycle 6 FOLFIRINOX 08/28/2023 Cycle 7 FOLFIRINOX 09/11/2023 Family history of pancreas cancerINVITAE panel 05/17/2023-NF1 VUS Severe hiccups following cycle 1 FOLFIRINOX, did not respond to baclofen or gabapentin.  Resolved with Reglan.  Recurrent hiccups following cycle 4 FOLFIRINOX-not relieved with Reglan  Disposition: [name] appears stable.  He has completed 6 cycles of FOLFIRINOX.  Aside from the hiccups he is tolerating treatment well.  Plan to proceed with cycle 7 FOLFIRINOX today as scheduled.  He will utilize Reglan for the hiccups as with previous cycles.  He has restaging CTs and follow-up at [facility] 09/16/2023.  CBC and chemistry panel reviewed.  Labs adequate to proceed with treatment today as scheduled.  He will return for follow-up in 2 weeks.  He will contact the office in the interim with any problems.    Lonna Cobb ANP/GNP-BC   09/11/2023  11:27 AM

## 2023-09-11 NOTE — Progress Notes (Signed)
Patient seen by Lonna Cobb NP today  Vitals are within treatment parameters:Yes   Labs are within treatment parameters: Yes   Treatment plan has been signed: Yes   Per physician team, Patient is ready for treatment and there are NO modifications to the treatment plan.

## 2023-09-11 NOTE — Patient Instructions (Addendum)
[facility]   The chemotherapy medication bag should finish at 46 hours, 96 hours, or 7 days. For example, if your pump is scheduled for 46 hours and it was put on at 4:00 p.m., it should finish at 2:00 p.m. the day it is scheduled to come off regardless of your appointment time.     Estimated time to finish at 3:30 Friday, September 13, 2023.   If the display on your pump reads "Low Volume" and it is beeping, take the batteries out of the pump and come to the cancer center for it to be taken off.   If the pump alarms go off prior to the pump reading "Low Volume" then call [phone] and someone can assist you.  If the plunger comes out and the chemotherapy medication is leaking out, please use your home chemo spill kit to clean up the spill. Do NOT use paper towels or other household products.  If you have problems or questions regarding your pump, please call either [phone] (24 hours a day) or the cancer center Monday-Friday 8:00 a.m.- 4:30 p.m. at the clinic number and we will assist you. If you are unable to get assistance, then go to the nearest Emergency Department and ask the staff to contact the IV team for assistance.  Discharge Instructions: Thank you for choosing [facility] to provide your oncology and hematology care.   If you have a lab appointment with the Cancer Center, please go directly to the Cancer Center and check in at the registration area.   Wear comfortable clothing and clothing appropriate for easy access to any Portacath or PICC line.   We strive to give you quality time with your provider. You may need to reschedule your appointment if you arrive late (15 or more minutes).  Arriving late affects you and other patients whose appointments are after yours.  Also, if you miss three or more appointments without notifying the office, you may be dismissed from the clinic at the provider's discretion.      For  prescription refill requests, have your pharmacy contact our office and allow 72 hours for refills to be completed.    Today you received the following chemotherapy and/or immunotherapy agents: oxaliplatin, irinotecan, leucovorin, fluorouracil.       To help prevent nausea and vomiting after your treatment, we encourage you to take your nausea medication as directed.  BELOW ARE SYMPTOMS THAT SHOULD BE REPORTED IMMEDIATELY: *FEVER GREATER THAN 100.4 F (38 C) OR HIGHER *CHILLS OR SWEATING *NAUSEA AND VOMITING THAT IS NOT CONTROLLED WITH YOUR NAUSEA MEDICATION *UNUSUAL SHORTNESS OF BREATH *UNUSUAL BRUISING OR BLEEDING *URINARY PROBLEMS (pain or burning when urinating, or frequent urination) *BOWEL PROBLEMS (unusual diarrhea, constipation, pain near the anus) TENDERNESS IN MOUTH AND THROAT WITH OR WITHOUT PRESENCE OF ULCERS (sore throat, sores in mouth, or a toothache) UNUSUAL RASH, SWELLING OR PAIN  UNUSUAL VAGINAL DISCHARGE OR ITCHING   Items with * indicate a potential emergency and should be followed up as soon as possible or go to the Emergency Department if any problems should occur.  Please show the CHEMOTHERAPY ALERT CARD or IMMUNOTHERAPY ALERT CARD at check-in to the Emergency Department and triage nurse.  Should you have questions after your visit or need to cancel or reschedule your appointment, please contact [facility]  Dept: [phone]  and follow the prompts.  Office hours are 8:00 a.m. to 4:30 p.m. Monday - Friday.  Please note that voicemails left after 4:00 p.m. may not be returned until the following business day.  We are closed weekends and major holidays. You have access to a nurse at all times for urgent questions. Please call the main number to the clinic Dept: [phone] and follow the prompts.   For any non-urgent questions, you may also contact your provider using MyChart. We now offer e-Visits for anyone 47 and older to request  care online for non-urgent symptoms. For details visit [email or web address].   Also download the MyChart app! Go to the app store, search "MyChart", open the app, select [place], and log in with your MyChart username and password.  Oxaliplatin Injection What is this medication? OXALIPLATIN (ox AL i PLA tin) treats colorectal cancer. It works by slowing down the growth of cancer cells. This medicine may be used for other purposes; ask your health care provider or pharmacist if you have questions. COMMON BRAND NAME(S): Eloxatin What should I tell my care team before I take this medication? They need to know if you have any of these conditions: Heart disease History of irregular heartbeat or rhythm Liver disease Low blood cell levels (white cells, red cells, and platelets) Lung or breathing disease, such as asthma Take medications that treat or prevent blood clots Tingling of the fingers, toes, or other nerve disorder An unusual or allergic reaction to oxaliplatin, other medications, foods, dyes, or preservatives If you or your partner are pregnant or trying to get pregnant Breast-feeding How should I use this medication? This medication is injected into a vein. It is given by your care team in a hospital or clinic setting. Talk to your care team about the use of this medication in children. Special care may be needed. Overdosage: If you think you have taken too much of this medicine contact a poison control center or emergency room at once. NOTE: This medicine is only for you. Do not share this medicine with others. What if I miss a dose? Keep appointments for follow-up doses. It is important not to miss a dose. Call your care team if you are unable to keep an appointment. What may interact with this medication? Do not take this medication with any of the following: Cisapride Dronedarone Pimozide Thioridazine This medication may also interact with the following: Aspirin and  aspirin-like medications Certain medications that treat or prevent blood clots, such as warfarin, apixaban, dabigatran, and rivaroxaban Cisplatin Cyclosporine Diuretics Medications for infection, such as acyclovir, adefovir, amphotericin B, bacitracin, cidofovir, foscarnet, ganciclovir, gentamicin, pentamidine, vancomycin NSAIDs, medications for pain and inflammation, such as ibuprofen or naproxen Other medications that cause heart rhythm changes Pamidronate Zoledronic acid This list may not describe all possible interactions. Give your health care provider a list of all the medicines, herbs, non-prescription drugs, or dietary supplements you use. Also tell them if you smoke, drink alcohol, or use illegal drugs. Some items may interact with your medicine. What should I watch for while using this medication? Your condition will be monitored carefully while you are receiving this medication. You may need blood work while taking this medication. This medication may make you feel generally unwell. This is not uncommon as chemotherapy can affect healthy cells as well as cancer cells. Report any side effects. Continue your course of treatment even though you feel ill unless your care team tells you to stop. This medication may increase your risk of getting an infection. Call your care team for advice if you get a fever, chills, sore  throat, or other symptoms of a cold or flu. Do not treat yourself. Try to avoid being around people who are sick. Avoid taking medications that contain aspirin, acetaminophen, ibuprofen, naproxen, or ketoprofen unless instructed by your care team. These medications may hide a fever. Be careful brushing or flossing your teeth or using a toothpick because you may get an infection or bleed more easily. If you have any dental work done, tell your dentist you are receiving this medication. This medication can make you more sensitive to cold. Do not drink cold drinks or use ice.  Cover exposed skin before coming in contact with cold temperatures or cold objects. When out in cold weather wear warm clothing and cover your mouth and nose to warm the air that goes into your lungs. Tell your care team if you get sensitive to the cold. Talk to your care team if you or your partner are pregnant or think either of you might be pregnant. This medication can cause serious birth defects if taken during pregnancy and for 9 months after the last dose. A negative pregnancy test is required before starting this medication. A reliable form of contraception is recommended while taking this medication and for 9 months after the last dose. Talk to your care team about effective forms of contraception. Do not father a child while taking this medication and for 6 months after the last dose. Use a condom while having sex during this time period. Do not breastfeed while taking this medication and for 3 months after the last dose. This medication may cause infertility. Talk to your care team if you are concerned about your fertility. What side effects may I notice from receiving this medication? Side effects that you should report to your care team as soon as possible: Allergic reactions--skin rash, itching, hives, swelling of the face, lips, tongue, or throat Bleeding--bloody or black, tar-like stools, vomiting blood or brown material that looks like coffee grounds, red or dark brown urine, small red or purple spots on skin, unusual bruising or bleeding Dry cough, shortness of breath or trouble breathing Heart rhythm changes--fast or irregular heartbeat, dizziness, feeling faint or lightheaded, chest pain, trouble breathing Infection--fever, chills, cough, sore throat, wounds that don't heal, pain or trouble when passing urine, general feeling of discomfort or being unwell Liver injury--right upper belly pain, loss of appetite, nausea, light-colored stool, dark yellow or brown urine, yellowing skin or  eyes, unusual weakness or fatigue Low red blood cell level--unusual weakness or fatigue, dizziness, headache, trouble breathing Muscle injury--unusual weakness or fatigue, muscle pain, dark yellow or brown urine, decrease in amount of urine Pain, tingling, or numbness in the hands or feet Sudden and severe headache, confusion, change in vision, seizures, which may be signs of posterior reversible encephalopathy syndrome (PRES) Unusual bruising or bleeding Side effects that usually do not require medical attention (report to your care team if they continue or are bothersome): Diarrhea Nausea Pain, redness, or swelling with sores inside the mouth or throat Unusual weakness or fatigue Vomiting This list may not describe all possible side effects. Call your doctor for medical advice about side effects. You may report side effects to FDA at 1-800-FDA-1088. Where should I keep my medication? This medication is given in a hospital or clinic. It will not be stored at home. NOTE: This sheet is a summary. It may not cover all possible information. If you have questions about this medicine, talk to your doctor, pharmacist, or health care provider.  2024  Elsevier/Gold Standard (2023-02-10 00:00:00) Irinotecan Injection What is this medication? IRINOTECAN (ir in oh TEE kan) treats some types of cancer. It works by slowing down the growth of cancer cells. This medicine may be used for other purposes; ask your health care provider or pharmacist if you have questions. COMMON BRAND NAME(S): Camptosar What should I tell my care team before I take this medication? They need to know if you have any of these conditions: Dehydration Diarrhea Infection, especially a viral infection, such as chickenpox, cold sores, herpes Liver disease Low blood cell levels (white cells, red cells, and platelets) Low levels of electrolytes, such as calcium, magnesium, or potassium in your blood Recent or ongoing radiation An  unusual or allergic reaction to irinotecan, other medications, foods, dyes, or preservatives If you or your partner are pregnant or trying to get pregnant Breast-feeding How should I use this medication? This medication is injected into a vein. It is given by your care team in a hospital or clinic setting. Talk to your care team about the use of this medication in children. Special care may be needed. Overdosage: If you think you have taken too much of this medicine contact a poison control center or emergency room at once. NOTE: This medicine is only for you. Do not share this medicine with others. What if I miss a dose? Keep appointments for follow-up doses. It is important not to miss your dose. Call your care team if you are unable to keep an appointment. What may interact with this medication? Do not take this medication with any of the following: Cobicistat Itraconazole This medication may also interact with the following: Certain antibiotics, such as clarithromycin, rifampin, rifabutin Certain antivirals for HIV or AIDS Certain medications for fungal infections, such as ketoconazole, posaconazole, voriconazole Certain medications for seizures, such as carbamazepine, phenobarbital, phenytoin Gemfibrozil Nefazodone St. John's wort This list may not describe all possible interactions. Give your health care provider a list of all the medicines, herbs, non-prescription drugs, or dietary supplements you use. Also tell them if you smoke, drink alcohol, or use illegal drugs. Some items may interact with your medicine. What should I watch for while using this medication? Your condition will be monitored carefully while you are receiving this medication. You may need blood work while taking this medication. This medication may make you feel generally unwell. This is not uncommon as chemotherapy can affect healthy cells as well as cancer cells. Report any side effects. Continue your course of  treatment even though you feel ill unless your care team tells you to stop. This medication can cause serious side effects. To reduce the risk, your care team may give you other medications to take before receiving this one. Be sure to follow the directions from your care team. This medication may affect your coordination, reaction time, or judgement. Do not drive or operate machinery until you know how this medication affects you. Sit up or stand slowly to reduce the risk of dizzy or fainting spells. Drinking alcohol with this medication can increase the risk of these side effects. This medication may increase your risk of getting an infection. Call your care team for advice if you get a fever, chills, sore throat, or other symptoms of a cold or flu. Do not treat yourself. Try to avoid being around people who are sick. Avoid taking medications that contain aspirin, acetaminophen, ibuprofen, naproxen, or ketoprofen unless instructed by your care team. These medications may hide a fever. This medication may increase  your risk to bruise or bleed. Call your care team if you notice any unusual bleeding. Be careful brushing or flossing your teeth or using a toothpick because you may get an infection or bleed more easily. If you have any dental work done, tell your dentist you are receiving this medication. Talk to your care team if you or your partner are pregnant or think either of you might be pregnant. This medication can cause serious birth defects if taken during pregnancy and for 6 months after the last dose. You will need a negative pregnancy test before starting this medication. Contraception is recommended while taking this medication and for 6 months after the last dose. Your care team can help you find the option that works for you. Do not father a child while taking this medication and for 3 months after the last dose. Use a condom for contraception during this time period. Do not breastfeed while  taking this medication and for 7 days after the last dose. This medication may cause infertility. Talk to your care team if you are concerned about your fertility. What side effects may I notice from receiving this medication? Side effects that you should report to your care team as soon as possible: Allergic reactions--skin rash, itching, hives, swelling of the face, lips, tongue, or throat Dry cough, shortness of breath or trouble breathing Increased saliva or tears, increased sweating, stomach cramping, diarrhea, small pupils, unusual weakness or fatigue, slow heartbeat Infection--fever, chills, cough, sore throat, wounds that don't heal, pain or trouble when passing urine, general feeling of discomfort or being unwell Kidney injury--decrease in the amount of urine, swelling of the ankles, hands, or feet Low red blood cell level--unusual weakness or fatigue, dizziness, headache, trouble breathing Severe or prolonged diarrhea Unusual bruising or bleeding Side effects that usually do not require medical attention (report to your care team if they continue or are bothersome): Constipation Diarrhea Hair loss Loss of appetite Nausea Stomach pain This list may not describe all possible side effects. Call your doctor for medical advice about side effects. You may report side effects to FDA at 1-800-FDA-1088. Where should I keep my medication? This medication is given in a hospital or clinic. It will not be stored at home. NOTE: This sheet is a summary. It may not cover all possible information. If you have questions about this medicine, talk to your doctor, pharmacist, or health care provider.  2024 Elsevier/Gold Standard (2022-04-16 00:00:00) Leucovorin Injection What is this medication? LEUCOVORIN (loo koe VOR in) prevents side effects from certain medications, such as methotrexate. It works by increasing folate levels. This helps protect healthy cells in your body. It may also be used to  treat anemia caused by low levels of folate. It can also be used with fluorouracil, a type of chemotherapy, to treat colorectal cancer. It works by increasing the effects of fluorouracil in the body. This medicine may be used for other purposes; ask your health care provider or pharmacist if you have questions. What should I tell my care team before I take this medication? They need to know if you have any of these conditions: Anemia from low levels of vitamin B12 in the blood An unusual or allergic reaction to leucovorin, folic acid, other medications, foods, dyes, or preservatives Pregnant or trying to get pregnant Breastfeeding How should I use this medication? This medication is injected into a vein or a muscle. It is given by your care team in a hospital or clinic setting. Talk  to your care team about the use of this medication in children. Special care may be needed. Overdosage: If you think you have taken too much of this medicine contact a poison control center or emergency room at once. NOTE: This medicine is only for you. Do not share this medicine with others. What if I miss a dose? Keep appointments for follow-up doses. It is important not to miss your dose. Call your care team if you are unable to keep an appointment. What may interact with this medication? Capecitabine Fluorouracil Phenobarbital Phenytoin Primidone Trimethoprim;sulfamethoxazole This list may not describe all possible interactions. Give your health care provider a list of all the medicines, herbs, non-prescription drugs, or dietary supplements you use. Also tell them if you smoke, drink alcohol, or use illegal drugs. Some items may interact with your medicine. What should I watch for while using this medication? Your condition will be monitored carefully while you are receiving this medication. This medication may increase the side effects of 5-fluorouracil. Tell your care team if you have diarrhea or mouth  sores that do not get better or that get worse. What side effects may I notice from receiving this medication? Side effects that you should report to your care team as soon as possible: Allergic reactions--skin rash, itching, hives, swelling of the face, lips, tongue, or throat This list may not describe all possible side effects. Call your doctor for medical advice about side effects. You may report side effects to FDA at 1-800-FDA-1088. Where should I keep my medication? This medication is given in a hospital or clinic. It will not be stored at home. NOTE: This sheet is a summary. It may not cover all possible information. If you have questions about this medicine, talk to your doctor, pharmacist, or health care provider.  2024 Elsevier/Gold Standard (2022-05-08 00:00:00) Fluorouracil Injection What is this medication? FLUOROURACIL (flure oh YOOR a sil) treats some types of cancer. It works by slowing down the growth of cancer cells. This medicine may be used for other purposes; ask your health care provider or pharmacist if you have questions. COMMON BRAND NAME(S): Adrucil What should I tell my care team before I take this medication? They need to know if you have any of these conditions: Blood disorders Dihydropyrimidine dehydrogenase (DPD) deficiency Infection, such as chickenpox, cold sores, herpes Kidney disease Liver disease Poor nutrition Recent or ongoing radiation therapy An unusual or allergic reaction to fluorouracil, other medications, foods, dyes, or preservatives If you or your partner are pregnant or trying to get pregnant Breast-feeding How should I use this medication? This medication is injected into a vein. It is administered by your care team in a hospital or clinic setting. Talk to your care team about the use of this medication in children. Special care may be needed. Overdosage: If you think you have taken too much of this medicine contact a poison control  center or emergency room at once. NOTE: This medicine is only for you. Do not share this medicine with others. What if I miss a dose? Keep appointments for follow-up doses. It is important not to miss your dose. Call your care team if you are unable to keep an appointment. What may interact with this medication? Do not take this medication with any of the following: Live virus vaccines This medication may also interact with the following: Medications that treat or prevent blood clots, such as warfarin, enoxaparin, dalteparin This list may not describe all possible interactions. Give your health care  provider a list of all the medicines, herbs, non-prescription drugs, or dietary supplements you use. Also tell them if you smoke, drink alcohol, or use illegal drugs. Some items may interact with your medicine. What should I watch for while using this medication? Your condition will be monitored carefully while you are receiving this medication. This medication may make you feel generally unwell. This is not uncommon as chemotherapy can affect healthy cells as well as cancer cells. Report any side effects. Continue your course of treatment even though you feel ill unless your care team tells you to stop. In some cases, you may be given additional medications to help with side effects. Follow all directions for their use. This medication may increase your risk of getting an infection. Call your care team for advice if you get a fever, chills, sore throat, or other symptoms of a cold or flu. Do not treat yourself. Try to avoid being around people who are sick. This medication may increase your risk to bruise or bleed. Call your care team if you notice any unusual bleeding. Be careful brushing or flossing your teeth or using a toothpick because you may get an infection or bleed more easily. If you have any dental work done, tell your dentist you are receiving this medication. Avoid taking medications that  contain aspirin, acetaminophen, ibuprofen, naproxen, or ketoprofen unless instructed by your care team. These medications may hide a fever. Do not treat diarrhea with over the counter products. Contact your care team if you have diarrhea that lasts more than 2 days or if it is severe and watery. This medication can make you more sensitive to the sun. Keep out of the sun. If you cannot avoid being in the sun, wear protective clothing and sunscreen. Do not use sun lamps, tanning beds, or tanning booths. Talk to your care team if you or your partner wish to become pregnant or think you might be pregnant. This medication can cause serious birth defects if taken during pregnancy and for 3 months after the last dose. A reliable form of contraception is recommended while taking this medication and for 3 months after the last dose. Talk to your care team about effective forms of contraception. Do not father a child while taking this medication and for 3 months after the last dose. Use a condom while having sex during this time period. Do not breastfeed while taking this medication. This medication may cause infertility. Talk to your care team if you are concerned about your fertility. What side effects may I notice from receiving this medication? Side effects that you should report to your care team as soon as possible: Allergic reactions--skin rash, itching, hives, swelling of the face, lips, tongue, or throat Heart attack--pain or tightness in the chest, shoulders, arms, or jaw, nausea, shortness of breath, cold or clammy skin, feeling faint or lightheaded Heart failure--shortness of breath, swelling of the ankles, feet, or hands, sudden weight gain, unusual weakness or fatigue Heart rhythm changes--fast or irregular heartbeat, dizziness, feeling faint or lightheaded, chest pain, trouble breathing High ammonia level--unusual weakness or fatigue, confusion, loss of appetite, nausea, vomiting,  seizures Infection--fever, chills, cough, sore throat, wounds that don't heal, pain or trouble when passing urine, general feeling of discomfort or being unwell Low red blood cell level--unusual weakness or fatigue, dizziness, headache, trouble breathing Pain, tingling, or numbness in the hands or feet, muscle weakness, change in vision, confusion or trouble speaking, loss of balance or coordination, trouble walking, seizures Redness,  swelling, and blistering of the skin over hands and feet Severe or prolonged diarrhea Unusual bruising or bleeding Side effects that usually do not require medical attention (report to your care team if they continue or are bothersome): Dry skin Headache Increased tears Nausea Pain, redness, or swelling with sores inside the mouth or throat Sensitivity to light Vomiting This list may not describe all possible side effects. Call your doctor for medical advice about side effects. You may report side effects to FDA at 1-800-FDA-1088. Where should I keep my medication? This medication is given in a hospital or clinic. It will not be stored at home. NOTE: This sheet is a summary. It may not cover all possible information. If you have questions about this medicine, talk to your doctor, pharmacist, or health care provider.  2024 [employer] (2022-04-10 00:00:00)

## 2023-09-12 ENCOUNTER — Other Ambulatory Visit: Payer: Self-pay

## 2023-09-12 LAB — CANCER ANTIGEN 19-9: CA 19-9: 711 U/mL — ABNORMAL HIGH (ref 0–35)

## 2023-09-13 ENCOUNTER — Telehealth: Payer: Self-pay

## 2023-09-13 ENCOUNTER — Inpatient Hospital Stay: Payer: 59

## 2023-09-13 VITALS — BP 128/81 | HR 75 | Temp 98.1°F | Resp 19

## 2023-09-13 DIAGNOSIS — C259 Malignant neoplasm of pancreas, unspecified: Secondary | ICD-10-CM

## 2023-09-13 DIAGNOSIS — Z5111 Encounter for antineoplastic chemotherapy: Secondary | ICD-10-CM | POA: Diagnosis not present

## 2023-09-13 MED ORDER — PEGFILGRASTIM-JMDB 6 MG/0.6ML ~~LOC~~ SOSY
6.0000 mg | PREFILLED_SYRINGE | Freq: Once | SUBCUTANEOUS | Status: AC
  Administered 2023-09-13: 6 mg via SUBCUTANEOUS
  Filled 2023-09-13: qty 0.6

## 2023-09-13 MED ORDER — HEPARIN SOD (PORK) LOCK FLUSH 100 UNIT/ML IV SOLN
500.0000 [IU] | Freq: Once | INTRAVENOUS | Status: AC | PRN
  Administered 2023-09-13: 500 [IU]

## 2023-09-13 MED ORDER — SODIUM CHLORIDE 0.9% FLUSH
10.0000 mL | INTRAVENOUS | Status: DC | PRN
  Administered 2023-09-13: 10 mL

## 2023-09-13 NOTE — Telephone Encounter (Signed)
-----   Message from Lonna Cobb sent at 09/13/2023  8:40 AM EDT ----- Please let him know the CA 19-9 is lower.  Follow-up as scheduled.

## 2023-09-13 NOTE — Telephone Encounter (Signed)
Patient gave verbal understanding and had no further questions or concerns  

## 2023-09-13 NOTE — Patient Instructions (Signed)

## 2023-09-20 ENCOUNTER — Other Ambulatory Visit: Payer: Self-pay | Admitting: Oncology

## 2023-09-20 DIAGNOSIS — C259 Malignant neoplasm of pancreas, unspecified: Secondary | ICD-10-CM

## 2023-09-24 MED FILL — Fosaprepitant Dimeglumine For IV Infusion 150 MG (Base Eq): INTRAVENOUS | Qty: 5 | Status: AC

## 2023-09-25 ENCOUNTER — Other Ambulatory Visit: Payer: Self-pay

## 2023-09-25 ENCOUNTER — Encounter: Payer: Self-pay | Admitting: *Deleted

## 2023-09-25 ENCOUNTER — Inpatient Hospital Stay: Payer: No Typology Code available for payment source | Attending: Hematology

## 2023-09-25 ENCOUNTER — Inpatient Hospital Stay: Payer: No Typology Code available for payment source

## 2023-09-25 ENCOUNTER — Other Ambulatory Visit: Payer: Self-pay | Admitting: *Deleted

## 2023-09-25 ENCOUNTER — Inpatient Hospital Stay (HOSPITAL_BASED_OUTPATIENT_CLINIC_OR_DEPARTMENT_OTHER): Payer: No Typology Code available for payment source | Admitting: Oncology

## 2023-09-25 VITALS — BP 142/82 | HR 68

## 2023-09-25 VITALS — BP 133/87 | HR 81 | Temp 98.1°F | Resp 18 | Ht 68.0 in | Wt 158.1 lb

## 2023-09-25 DIAGNOSIS — C259 Malignant neoplasm of pancreas, unspecified: Secondary | ICD-10-CM

## 2023-09-25 DIAGNOSIS — Z800 Family history of malignant neoplasm of digestive organs: Secondary | ICD-10-CM | POA: Insufficient documentation

## 2023-09-25 DIAGNOSIS — C787 Secondary malignant neoplasm of liver and intrahepatic bile duct: Secondary | ICD-10-CM | POA: Insufficient documentation

## 2023-09-25 DIAGNOSIS — Z5189 Encounter for other specified aftercare: Secondary | ICD-10-CM | POA: Insufficient documentation

## 2023-09-25 DIAGNOSIS — C252 Malignant neoplasm of tail of pancreas: Secondary | ICD-10-CM | POA: Diagnosis present

## 2023-09-25 DIAGNOSIS — Z5111 Encounter for antineoplastic chemotherapy: Secondary | ICD-10-CM | POA: Diagnosis present

## 2023-09-25 LAB — CMP (CANCER CENTER ONLY)
ALT: 30 U/L (ref 0–44)
AST: 25 U/L (ref 15–41)
Albumin: 4.1 g/dL (ref 3.5–5.0)
Alkaline Phosphatase: 202 U/L — ABNORMAL HIGH (ref 38–126)
Anion gap: 7 (ref 5–15)
BUN: 14 mg/dL (ref 6–20)
CO2: 26 mmol/L (ref 22–32)
Calcium: 9.3 mg/dL (ref 8.9–10.3)
Chloride: 102 mmol/L (ref 98–111)
Creatinine: 0.78 mg/dL (ref 0.61–1.24)
GFR, Estimated: >60 mL/min (ref >60)
Glucose, Bld: 253 mg/dL — ABNORMAL HIGH (ref 70–99)
Potassium: 4.1 mmol/L (ref 3.5–5.1)
Sodium: 135 mmol/L (ref 135–145)
Total Bilirubin: 0.4 mg/dL (ref 0.3–1.2)
Total Protein: 6.8 g/dL (ref 6.5–8.1)

## 2023-09-25 LAB — CBC WITH DIFFERENTIAL (CANCER CENTER ONLY)
Abs Immature Granulocytes: 0.08 10*3/uL — ABNORMAL HIGH (ref 0.00–0.07)
Basophils Absolute: 0 10*3/uL (ref 0.0–0.1)
Basophils Relative: 0 %
Eosinophils Absolute: 0.1 10*3/uL (ref 0.0–0.5)
Eosinophils Relative: 2 %
HCT: 37.9 % — ABNORMAL LOW (ref 39.0–52.0)
Hemoglobin: 12.9 g/dL — ABNORMAL LOW (ref 13.0–17.0)
Immature Granulocytes: 1 %
Lymphocytes Relative: 21 %
Lymphs Abs: 1.2 10*3/uL (ref 0.7–4.0)
MCH: 32.3 pg (ref 26.0–34.0)
MCHC: 34 g/dL (ref 30.0–36.0)
MCV: 94.8 fL (ref 80.0–100.0)
Monocytes Absolute: 0.7 10*3/uL (ref 0.1–1.0)
Monocytes Relative: 13 %
Neutro Abs: 3.6 10*3/uL (ref 1.7–7.7)
Neutrophils Relative %: 63 %
Platelet Count: 162 10*3/uL (ref 150–400)
RBC: 4 MIL/uL — ABNORMAL LOW (ref 4.22–5.81)
RDW: 17 % — ABNORMAL HIGH (ref 11.5–15.5)
WBC Count: 5.8 10*3/uL (ref 4.0–10.5)
nRBC: 0 % (ref 0.0–0.2)

## 2023-09-25 MED ORDER — SODIUM CHLORIDE 0.9 % IV SOLN
2400.0000 mg/m2 | INTRAVENOUS | Status: DC
  Administered 2023-09-25: 4450 mg via INTRAVENOUS
  Filled 2023-09-25: qty 89

## 2023-09-25 MED ORDER — CHLORPROMAZINE HCL 25 MG PO TABS
25.0000 mg | ORAL_TABLET | Freq: Three times a day (TID) | ORAL | 3 refills | Status: DC | PRN
Start: 2023-09-26 — End: 2024-04-03

## 2023-09-25 MED ORDER — ATROPINE SULFATE 1 MG/ML IV SOLN
0.5000 mg | Freq: Once | INTRAVENOUS | Status: AC | PRN
  Administered 2023-09-25: 0.5 mg via INTRAVENOUS
  Filled 2023-09-25: qty 1

## 2023-09-25 MED ORDER — SODIUM CHLORIDE 0.9% FLUSH
10.0000 mL | INTRAVENOUS | Status: DC | PRN
  Administered 2023-09-25: 10 mL

## 2023-09-25 MED ORDER — PALONOSETRON HCL INJECTION 0.25 MG/5ML
0.2500 mg | Freq: Once | INTRAVENOUS | Status: AC
  Administered 2023-09-25: 0.25 mg via INTRAVENOUS
  Filled 2023-09-25: qty 5

## 2023-09-25 MED ORDER — DEXTROSE 5 % IV SOLN: Freq: Once | INTRAVENOUS | Status: AC

## 2023-09-25 MED ORDER — SODIUM CHLORIDE 0.9 % IV SOLN
400.0000 mg/m2 | Freq: Once | INTRAVENOUS | Status: DC
  Filled 2023-09-25: qty 37.2

## 2023-09-25 MED ORDER — SODIUM CHLORIDE 0.9 % IV SOLN
150.0000 mg | Freq: Once | INTRAVENOUS | Status: AC
  Administered 2023-09-25: 150 mg via INTRAVENOUS
  Filled 2023-09-25: qty 150

## 2023-09-25 MED ORDER — SODIUM CHLORIDE 0.9 % IV SOLN
5.0000 mg | Freq: Once | INTRAVENOUS | Status: DC
  Filled 2023-09-25: qty 0.5

## 2023-09-25 MED ORDER — OXALIPLATIN CHEMO INJECTION 100 MG/20ML
65.0000 mg/m2 | Freq: Once | INTRAVENOUS | Status: AC
  Administered 2023-09-25: 120 mg via INTRAVENOUS
  Filled 2023-09-25: qty 20

## 2023-09-25 MED ORDER — SODIUM CHLORIDE 0.9 % IV SOLN
150.0000 mg/m2 | Freq: Once | INTRAVENOUS | Status: AC
  Administered 2023-09-25: 300 mg via INTRAVENOUS
  Filled 2023-09-25: qty 15

## 2023-09-25 MED ORDER — SODIUM CHLORIDE 0.9 % IV SOLN
400.0000 mg/m2 | Freq: Once | INTRAVENOUS | Status: AC
  Administered 2023-09-25: 744 mg via INTRAVENOUS
  Filled 2023-09-25: qty 37.2

## 2023-09-25 MED ORDER — DEXAMETHASONE SODIUM PHOSPHATE 10 MG/ML IJ SOLN
5.0000 mg | Freq: Once | INTRAMUSCULAR | Status: AC
  Administered 2023-09-25: 5 mg via INTRAVENOUS
  Filled 2023-09-25: qty 1

## 2023-09-25 NOTE — Patient Instructions (Addendum)
[facility]   The chemotherapy medication bag should finish at 46 hours, 96 hours, or 7 days. For example, if your pump is scheduled for 46 hours and it was put on at 4:00 p.m., it should finish at 2:00 p.m. the day it is scheduled to come off regardless of your appointment time.     Estimated time to finish at 1:30pm Friday, October 11th, 2024.   If the display on your pump reads "Low Volume" and it is beeping, take the batteries out of the pump and come to the cancer center for it to be taken off.   If the pump alarms go off prior to the pump reading "Low Volume" then call [phone] and someone can assist you.  If the plunger comes out and the chemotherapy medication is leaking out, please use your home chemo spill kit to clean up the spill. Do NOT use paper towels or other household products.  If you have problems or questions regarding your pump, please call either [phone] (24 hours a day) or the cancer center Monday-Friday 8:00 a.m.- 4:30 p.m. at the clinic number and we will assist you. If you are unable to get assistance, then go to the nearest Emergency Department and ask the staff to contact the IV team for assistance.  Discharge Instructions: Thank you for choosing [facility] to provide your oncology and hematology care.   If you have a lab appointment with the Cancer Center, please go directly to the Cancer Center and check in at the registration area.   Wear comfortable clothing and clothing appropriate for easy access to any Portacath or PICC line.   We strive to give you quality time with your provider. You may need to reschedule your appointment if you arrive late (15 or more minutes).  Arriving late affects you and other patients whose appointments are after yours.  Also, if you miss three or more appointments without notifying the office, you may be dismissed from the clinic at the provider's discretion.      For  prescription refill requests, have your pharmacy contact our office and allow 72 hours for refills to be completed.    Today you received the following chemotherapy and/or immunotherapy agents: oxaliplatin, irinotecan, leucovorin, fluorouracil.       To help prevent nausea and vomiting after your treatment, we encourage you to take your nausea medication as directed.  BELOW ARE SYMPTOMS THAT SHOULD BE REPORTED IMMEDIATELY: *FEVER GREATER THAN 100.4 F (38 C) OR HIGHER *CHILLS OR SWEATING *NAUSEA AND VOMITING THAT IS NOT CONTROLLED WITH YOUR NAUSEA MEDICATION *UNUSUAL SHORTNESS OF BREATH *UNUSUAL BRUISING OR BLEEDING *URINARY PROBLEMS (pain or burning when urinating, or frequent urination) *BOWEL PROBLEMS (unusual diarrhea, constipation, pain near the anus) TENDERNESS IN MOUTH AND THROAT WITH OR WITHOUT PRESENCE OF ULCERS (sore throat, sores in mouth, or a toothache) UNUSUAL RASH, SWELLING OR PAIN  UNUSUAL VAGINAL DISCHARGE OR ITCHING   Items with * indicate a potential emergency and should be followed up as soon as possible or go to the Emergency Department if any problems should occur.  Please show the CHEMOTHERAPY ALERT CARD or IMMUNOTHERAPY ALERT CARD at check-in to the Emergency Department and triage nurse.  Should you have questions after your visit or need to cancel or reschedule your appointment, please contact [facility]  Dept: [phone]  and follow the prompts.  Office hours are 8:00 a.m. to 4:30 p.m. Monday - Friday.  Please note that voicemails left after 4:00 p.m. may not be returned until the following business day.  We are closed weekends and major holidays. You have access to a nurse at all times for urgent questions. Please call the main number to the clinic Dept: [phone] and follow the prompts.   For any non-urgent questions, you may also contact your provider using MyChart. We now offer e-Visits for anyone 73 and older to request  care online for non-urgent symptoms. For details visit [email or web address].   Also download the MyChart app! Go to the app store, search "MyChart", open the app, select , and log in with your MyChart username and password.  Oxaliplatin Injection What is this medication? OXALIPLATIN (ox AL i PLA tin) treats colorectal cancer. It works by slowing down the growth of cancer cells. This medicine may be used for other purposes; ask your health care provider or pharmacist if you have questions. COMMON BRAND NAME(S): Eloxatin What should I tell my care team before I take this medication? They need to know if you have any of these conditions: Heart disease History of irregular heartbeat or rhythm Liver disease Low blood cell levels (white cells, red cells, and platelets) Lung or breathing disease, such as asthma Take medications that treat or prevent blood clots Tingling of the fingers, toes, or other nerve disorder An unusual or allergic reaction to oxaliplatin, other medications, foods, dyes, or preservatives If you or your partner are pregnant or trying to get pregnant Breast-feeding How should I use this medication? This medication is injected into a vein. It is given by your care team in a hospital or clinic setting. Talk to your care team about the use of this medication in children. Special care may be needed. Overdosage: If you think you have taken too much of this medicine contact a poison control center or emergency room at once. NOTE: This medicine is only for you. Do not share this medicine with others. What if I miss a dose? Keep appointments for follow-up doses. It is important not to miss a dose. Call your care team if you are unable to keep an appointment. What may interact with this medication? Do not take this medication with any of the following: Cisapride Dronedarone Pimozide Thioridazine This medication may also interact with the following: Aspirin and  aspirin-like medications Certain medications that treat or prevent blood clots, such as warfarin, apixaban, dabigatran, and rivaroxaban Cisplatin Cyclosporine Diuretics Medications for infection, such as acyclovir, adefovir, amphotericin B, bacitracin, cidofovir, foscarnet, ganciclovir, gentamicin, pentamidine, vancomycin NSAIDs, medications for pain and inflammation, such as ibuprofen or naproxen Other medications that cause heart rhythm changes Pamidronate Zoledronic acid This list may not describe all possible interactions. Give your health care provider a list of all the medicines, herbs, non-prescription drugs, or dietary supplements you use. Also tell them if you smoke, drink alcohol, or use illegal drugs. Some items may interact with your medicine. What should I watch for while using this medication? Your condition will be monitored carefully while you are receiving this medication. You may need blood work while taking this medication. This medication may make you feel generally unwell. This is not uncommon as chemotherapy can affect healthy cells as well as cancer cells. Report any side effects. Continue your course of treatment even though you feel ill unless your care team tells you to stop. This medication may increase your risk of getting an infection. Call your care team for advice if you get a fever, chills, sore  throat, or other symptoms of a cold or flu. Do not treat yourself. Try to avoid being around people who are sick. Avoid taking medications that contain aspirin, acetaminophen, ibuprofen, naproxen, or ketoprofen unless instructed by your care team. These medications may hide a fever. Be careful brushing or flossing your teeth or using a toothpick because you may get an infection or bleed more easily. If you have any dental work done, tell your dentist you are receiving this medication. This medication can make you more sensitive to cold. Do not drink cold drinks or use ice.  Cover exposed skin before coming in contact with cold temperatures or cold objects. When out in cold weather wear warm clothing and cover your mouth and nose to warm the air that goes into your lungs. Tell your care team if you get sensitive to the cold. Talk to your care team if you or your partner are pregnant or think either of you might be pregnant. This medication can cause serious birth defects if taken during pregnancy and for 9 months after the last dose. A negative pregnancy test is required before starting this medication. A reliable form of contraception is recommended while taking this medication and for 9 months after the last dose. Talk to your care team about effective forms of contraception. Do not father a child while taking this medication and for 6 months after the last dose. Use a condom while having sex during this time period. Do not breastfeed while taking this medication and for 3 months after the last dose. This medication may cause infertility. Talk to your care team if you are concerned about your fertility. What side effects may I notice from receiving this medication? Side effects that you should report to your care team as soon as possible: Allergic reactions--skin rash, itching, hives, swelling of the face, lips, tongue, or throat Bleeding--bloody or black, tar-like stools, vomiting blood or brown material that looks like coffee grounds, red or dark brown urine, small red or purple spots on skin, unusual bruising or bleeding Dry cough, shortness of breath or trouble breathing Heart rhythm changes--fast or irregular heartbeat, dizziness, feeling faint or lightheaded, chest pain, trouble breathing Infection--fever, chills, cough, sore throat, wounds that don't heal, pain or trouble when passing urine, general feeling of discomfort or being unwell Liver injury--right upper belly pain, loss of appetite, nausea, light-colored stool, dark yellow or brown urine, yellowing skin or  eyes, unusual weakness or fatigue Low red blood cell level--unusual weakness or fatigue, dizziness, headache, trouble breathing Muscle injury--unusual weakness or fatigue, muscle pain, dark yellow or brown urine, decrease in amount of urine Pain, tingling, or numbness in the hands or feet Sudden and severe headache, confusion, change in vision, seizures, which may be signs of posterior reversible encephalopathy syndrome (PRES) Unusual bruising or bleeding Side effects that usually do not require medical attention (report to your care team if they continue or are bothersome): Diarrhea Nausea Pain, redness, or swelling with sores inside the mouth or throat Unusual weakness or fatigue Vomiting This list may not describe all possible side effects. Call your doctor for medical advice about side effects. You may report side effects to FDA at 1-800-FDA-1088. Where should I keep my medication? This medication is given in a hospital or clinic. It will not be stored at home. NOTE: This sheet is a summary. It may not cover all possible information. If you have questions about this medicine, talk to your doctor, pharmacist, or health care provider.  2024  Elsevier/Gold Standard (2023-02-10 00:00:00) Irinotecan Injection What is this medication? IRINOTECAN (ir in oh TEE kan) treats some types of cancer. It works by slowing down the growth of cancer cells. This medicine may be used for other purposes; ask your health care provider or pharmacist if you have questions. COMMON BRAND NAME(S): Camptosar What should I tell my care team before I take this medication? They need to know if you have any of these conditions: Dehydration Diarrhea Infection, especially a viral infection, such as chickenpox, cold sores, herpes Liver disease Low blood cell levels (white cells, red cells, and platelets) Low levels of electrolytes, such as calcium, magnesium, or potassium in your blood Recent or ongoing radiation An  unusual or allergic reaction to irinotecan, other medications, foods, dyes, or preservatives If you or your partner are pregnant or trying to get pregnant Breast-feeding How should I use this medication? This medication is injected into a vein. It is given by your care team in a hospital or clinic setting. Talk to your care team about the use of this medication in children. Special care may be needed. Overdosage: If you think you have taken too much of this medicine contact a poison control center or emergency room at once. NOTE: This medicine is only for you. Do not share this medicine with others. What if I miss a dose? Keep appointments for follow-up doses. It is important not to miss your dose. Call your care team if you are unable to keep an appointment. What may interact with this medication? Do not take this medication with any of the following: Cobicistat Itraconazole This medication may also interact with the following: Certain antibiotics, such as clarithromycin, rifampin, rifabutin Certain antivirals for HIV or AIDS Certain medications for fungal infections, such as ketoconazole, posaconazole, voriconazole Certain medications for seizures, such as carbamazepine, phenobarbital, phenytoin Gemfibrozil Nefazodone St. John's wort This list may not describe all possible interactions. Give your health care provider a list of all the medicines, herbs, non-prescription drugs, or dietary supplements you use. Also tell them if you smoke, drink alcohol, or use illegal drugs. Some items may interact with your medicine. What should I watch for while using this medication? Your condition will be monitored carefully while you are receiving this medication. You may need blood work while taking this medication. This medication may make you feel generally unwell. This is not uncommon as chemotherapy can affect healthy cells as well as cancer cells. Report any side effects. Continue your course of  treatment even though you feel ill unless your care team tells you to stop. This medication can cause serious side effects. To reduce the risk, your care team may give you other medications to take before receiving this one. Be sure to follow the directions from your care team. This medication may affect your coordination, reaction time, or judgement. Do not drive or operate machinery until you know how this medication affects you. Sit up or stand slowly to reduce the risk of dizzy or fainting spells. Drinking alcohol with this medication can increase the risk of these side effects. This medication may increase your risk of getting an infection. Call your care team for advice if you get a fever, chills, sore throat, or other symptoms of a cold or flu. Do not treat yourself. Try to avoid being around people who are sick. Avoid taking medications that contain aspirin, acetaminophen, ibuprofen, naproxen, or ketoprofen unless instructed by your care team. These medications may hide a fever. This medication may increase  your risk to bruise or bleed. Call your care team if you notice any unusual bleeding. Be careful brushing or flossing your teeth or using a toothpick because you may get an infection or bleed more easily. If you have any dental work done, tell your dentist you are receiving this medication. Talk to your care team if you or your partner are pregnant or think either of you might be pregnant. This medication can cause serious birth defects if taken during pregnancy and for 6 months after the last dose. You will need a negative pregnancy test before starting this medication. Contraception is recommended while taking this medication and for 6 months after the last dose. Your care team can help you find the option that works for you. Do not father a child while taking this medication and for 3 months after the last dose. Use a condom for contraception during this time period. Do not breastfeed while  taking this medication and for 7 days after the last dose. This medication may cause infertility. Talk to your care team if you are concerned about your fertility. What side effects may I notice from receiving this medication? Side effects that you should report to your care team as soon as possible: Allergic reactions--skin rash, itching, hives, swelling of the face, lips, tongue, or throat Dry cough, shortness of breath or trouble breathing Increased saliva or tears, increased sweating, stomach cramping, diarrhea, small pupils, unusual weakness or fatigue, slow heartbeat Infection--fever, chills, cough, sore throat, wounds that don't heal, pain or trouble when passing urine, general feeling of discomfort or being unwell Kidney injury--decrease in the amount of urine, swelling of the ankles, hands, or feet Low red blood cell level--unusual weakness or fatigue, dizziness, headache, trouble breathing Severe or prolonged diarrhea Unusual bruising or bleeding Side effects that usually do not require medical attention (report to your care team if they continue or are bothersome): Constipation Diarrhea Hair loss Loss of appetite Nausea Stomach pain This list may not describe all possible side effects. Call your doctor for medical advice about side effects. You may report side effects to FDA at 1-800-FDA-1088. Where should I keep my medication? This medication is given in a hospital or clinic. It will not be stored at home. NOTE: This sheet is a summary. It may not cover all possible information. If you have questions about this medicine, talk to your doctor, pharmacist, or health care provider.  2024 Elsevier/Gold Standard (2022-04-16 00:00:00) Leucovorin Injection What is this medication? LEUCOVORIN (loo koe VOR in) prevents side effects from certain medications, such as methotrexate. It works by increasing folate levels. This helps protect healthy cells in your body. It may also be used to  treat anemia caused by low levels of folate. It can also be used with fluorouracil, a type of chemotherapy, to treat colorectal cancer. It works by increasing the effects of fluorouracil in the body. This medicine may be used for other purposes; ask your health care provider or pharmacist if you have questions. What should I tell my care team before I take this medication? They need to know if you have any of these conditions: Anemia from low levels of vitamin B12 in the blood An unusual or allergic reaction to leucovorin, folic acid, other medications, foods, dyes, or preservatives Pregnant or trying to get pregnant Breastfeeding How should I use this medication? This medication is injected into a vein or a muscle. It is given by your care team in a hospital or clinic setting. Talk  to your care team about the use of this medication in children. Special care may be needed. Overdosage: If you think you have taken too much of this medicine contact a poison control center or emergency room at once. NOTE: This medicine is only for you. Do not share this medicine with others. What if I miss a dose? Keep appointments for follow-up doses. It is important not to miss your dose. Call your care team if you are unable to keep an appointment. What may interact with this medication? Capecitabine Fluorouracil Phenobarbital Phenytoin Primidone Trimethoprim;sulfamethoxazole This list may not describe all possible interactions. Give your health care provider a list of all the medicines, herbs, non-prescription drugs, or dietary supplements you use. Also tell them if you smoke, drink alcohol, or use illegal drugs. Some items may interact with your medicine. What should I watch for while using this medication? Your condition will be monitored carefully while you are receiving this medication. This medication may increase the side effects of 5-fluorouracil. Tell your care team if you have diarrhea or mouth  sores that do not get better or that get worse. What side effects may I notice from receiving this medication? Side effects that you should report to your care team as soon as possible: Allergic reactions--skin rash, itching, hives, swelling of the face, lips, tongue, or throat This list may not describe all possible side effects. Call your doctor for medical advice about side effects. You may report side effects to FDA at 1-800-FDA-1088. Where should I keep my medication? This medication is given in a hospital or clinic. It will not be stored at home. NOTE: This sheet is a summary. It may not cover all possible information. If you have questions about this medicine, talk to your doctor, pharmacist, or health care provider.  2024 Elsevier/Gold Standard (2022-05-08 00:00:00) Fluorouracil Injection What is this medication? FLUOROURACIL (flure oh YOOR a sil) treats some types of cancer. It works by slowing down the growth of cancer cells. This medicine may be used for other purposes; ask your health care provider or pharmacist if you have questions. COMMON BRAND NAME(S): Adrucil What should I tell my care team before I take this medication? They need to know if you have any of these conditions: Blood disorders Dihydropyrimidine dehydrogenase (DPD) deficiency Infection, such as chickenpox, cold sores, herpes Kidney disease Liver disease Poor nutrition Recent or ongoing radiation therapy An unusual or allergic reaction to fluorouracil, other medications, foods, dyes, or preservatives If you or your partner are pregnant or trying to get pregnant Breast-feeding How should I use this medication? This medication is injected into a vein. It is administered by your care team in a hospital or clinic setting. Talk to your care team about the use of this medication in children. Special care may be needed. Overdosage: If you think you have taken too much of this medicine contact a poison control  center or emergency room at once. NOTE: This medicine is only for you. Do not share this medicine with others. What if I miss a dose? Keep appointments for follow-up doses. It is important not to miss your dose. Call your care team if you are unable to keep an appointment. What may interact with this medication? Do not take this medication with any of the following: Live virus vaccines This medication may also interact with the following: Medications that treat or prevent blood clots, such as warfarin, enoxaparin, dalteparin This list may not describe all possible interactions. Give your health care  provider a list of all the medicines, herbs, non-prescription drugs, or dietary supplements you use. Also tell them if you smoke, drink alcohol, or use illegal drugs. Some items may interact with your medicine. What should I watch for while using this medication? Your condition will be monitored carefully while you are receiving this medication. This medication may make you feel generally unwell. This is not uncommon as chemotherapy can affect healthy cells as well as cancer cells. Report any side effects. Continue your course of treatment even though you feel ill unless your care team tells you to stop. In some cases, you may be given additional medications to help with side effects. Follow all directions for their use. This medication may increase your risk of getting an infection. Call your care team for advice if you get a fever, chills, sore throat, or other symptoms of a cold or flu. Do not treat yourself. Try to avoid being around people who are sick. This medication may increase your risk to bruise or bleed. Call your care team if you notice any unusual bleeding. Be careful brushing or flossing your teeth or using a toothpick because you may get an infection or bleed more easily. If you have any dental work done, tell your dentist you are receiving this medication. Avoid taking medications that  contain aspirin, acetaminophen, ibuprofen, naproxen, or ketoprofen unless instructed by your care team. These medications may hide a fever. Do not treat diarrhea with over the counter products. Contact your care team if you have diarrhea that lasts more than 2 days or if it is severe and watery. This medication can make you more sensitive to the sun. Keep out of the sun. If you cannot avoid being in the sun, wear protective clothing and sunscreen. Do not use sun lamps, tanning beds, or tanning booths. Talk to your care team if you or your partner wish to become pregnant or think you might be pregnant. This medication can cause serious birth defects if taken during pregnancy and for 3 months after the last dose. A reliable form of contraception is recommended while taking this medication and for 3 months after the last dose. Talk to your care team about effective forms of contraception. Do not father a child while taking this medication and for 3 months after the last dose. Use a condom while having sex during this time period. Do not breastfeed while taking this medication. This medication may cause infertility. Talk to your care team if you are concerned about your fertility. What side effects may I notice from receiving this medication? Side effects that you should report to your care team as soon as possible: Allergic reactions--skin rash, itching, hives, swelling of the face, lips, tongue, or throat Heart attack--pain or tightness in the chest, shoulders, arms, or jaw, nausea, shortness of breath, cold or clammy skin, feeling faint or lightheaded Heart failure--shortness of breath, swelling of the ankles, feet, or hands, sudden weight gain, unusual weakness or fatigue Heart rhythm changes--fast or irregular heartbeat, dizziness, feeling faint or lightheaded, chest pain, trouble breathing High ammonia level--unusual weakness or fatigue, confusion, loss of appetite, nausea, vomiting,  seizures Infection--fever, chills, cough, sore throat, wounds that don't heal, pain or trouble when passing urine, general feeling of discomfort or being unwell Low red blood cell level--unusual weakness or fatigue, dizziness, headache, trouble breathing Pain, tingling, or numbness in the hands or feet, muscle weakness, change in vision, confusion or trouble speaking, loss of balance or coordination, trouble walking, seizures Redness,  swelling, and blistering of the skin over hands and feet Severe or prolonged diarrhea Unusual bruising or bleeding Side effects that usually do not require medical attention (report to your care team if they continue or are bothersome): Dry skin Headache Increased tears Nausea Pain, redness, or swelling with sores inside the mouth or throat Sensitivity to light Vomiting This list may not describe all possible side effects. Call your doctor for medical advice about side effects. You may report side effects to FDA at 1-800-FDA-1088. Where should I keep my medication? This medication is given in a hospital or clinic. It will not be stored at home. NOTE: This sheet is a summary. It may not cover all possible information. If you have questions about this medicine, talk to your doctor, pharmacist, or health care provider.  2024 Elsevier/Gold Standard (2022-04-10 00:00:00)

## 2023-09-25 NOTE — Progress Notes (Signed)
Patient seen by Dr. Thornton Papas today  Vitals are within treatment parameters:Yes   Labs are within treatment parameters: Yes OK to treat w/glucose 253 and alk phos 202  Treatment plan has been signed: Yes   Per physician team, Patient is ready for treatment and there are NO modifications to the treatment plan.  Has changed regimen at home to thorazine. Will have EDG today before leaving.

## 2023-09-25 NOTE — Progress Notes (Signed)
[name] OFF[facility] PROGRESS NOTE   Diagnosis: Pancreas cancer  INTERVAL HISTORY:   David [name] returns as scheduled.  He completed another cycle of FOLFIRINOX 09/11/2023.  No nausea/vomiting, mouth sores, or diarrhea.  He had cold sensitivity following chemotherapy.  No neuropathy symptoms at present.  He had hiccups for 5 days despite taking metoclopramide.  Objective:  Vital signs in last 24 hours:  Blood pressure 133/87, pulse 81, temperature 98.1 F (36.7 C), temperature source Temporal, resp. rate 18, height 5\' 8"  (1.727 m), weight 158 lb 1.6 oz (71.7 kg), SpO2 100%.    HEENT: No thrush or ulcers Resp: Lungs clear bilaterally Cardio: Regular rate and rhythm GI: No hepatosplenomegaly, no mass, nontender Vascular: No leg edema Neuro: Mild loss of vibratory sense at the fingertips bilaterally   Portacath/PICC-without erythema  Lab Results:  Lab Results  Component Value Date   WBC 5.8 09/25/2023   HGB 12.9 (L) 09/25/2023   HCT 37.9 (L) 09/25/2023   MCV 94.8 09/25/2023   PLT 162 09/25/2023   NEUTROABS 3.6 09/25/2023    CMP  Lab Results  Component Value Date   NA 135 09/25/2023   K 4.1 09/25/2023   CL 102 09/25/2023   CO2 26 09/25/2023   GLUCOSE 253 (H) 09/25/2023   BUN 14 09/25/2023   CREATININE 0.78 09/25/2023   CALCIUM 9.3 09/25/2023   PROT 6.8 09/25/2023   ALBUMIN 4.1 09/25/2023   AST 25 09/25/2023   ALT 30 09/25/2023   ALKPHOS 202 (H) 09/25/2023   BILITOT 0.4 09/25/2023   GFRNONAA >60 09/25/2023   GFRAA >60 03/26/2018    Lab Results  Component Value Date   CEA 772.45 (H) 05/02/2023   [id number] 711 (H) 09/11/2023    Medications: I have reviewed the patient's current medications.   Assessment/Plan: Pancreas cancer CT angiogram chest 04/24/2023-low-density mass in the dome of the liver with at least 3 additional lesions in both hepatic lobes, borderline lymphadenopathy in the hepatoduodenal ligament Right upper quadrant ultrasound  04/24/2023-large circumscribed mass of the liver dome CT abdomen/pelvis 04/24/2023-multiple liver lesions poorly defined on noncontrast exam, prominent portacaval and porta hepatis nodes MRI abdomen 05/05/2023-multiple rim-hypoenhancing liver lesions consistent with hepatic metastases, suspicion for a pancreas tail mass Ultrasound-guided biopsy of dominant right liver lesion 05/10/2023-adenocarcinoma, CK7 and CDX2 positive, abundant cytoplasm and mucin with extracellular mucin, foci of lymphovascular invasion Tempus gene panel-K-ras G12V, T P53 mild tumor mutation burden 3.2, MSS PET 06/04/2023-multiple hypermetabolic liver lesions consistent with metastases, low-level FDG uptake in the soft tissue fullness at the tip of the pancreas tail, low-level FDG activity involving a small soft tissue nodule between the gastric fundus and spleen, tiny foci of accumulation at the right costovertebral junction at T10 and roof of the left acetabulum without an underlying CT lesion Evaded CEA and CA 19-9 EUS 06/07/2023 ,22x 15 mm irregular mass in the tail the pancreas, 11 mm subcarinal lymph node, FNA biopsy of the pancreas mass-suspicious for malignancy Cycle 1 FOLFIRINOX 06/19/2023 Cycle 2 FOLFIRINOX 07/03/2023 Cycle 3 FOLFIRINOX 07/17/2023 Cycle 4 FOLFIRINOX 07/31/2023 Cycle 5 FOLFIRINOX 08/14/2023, oxaliplatin and Decadron dose reduced Cycle 6 FOLFIRINOX 08/28/2023 Cycle 7 FOLFIRINOX 09/11/2023 CTs at [facility] 09/16/2023, compared to 04/24/2023-unchanged pancreas tail mass, decrease in attenuation of hepatic metastases, several lesions have decreased in size, no new lesions Family history of pancreas cancerINVITAE panel 05/17/2023-NF1 VUS Severe hiccups following cycle 1 FOLFIRINOX, did not respond to baclofen or gabapentin.  Resolved with Reglan.  Recurrent hiccups following cycle 4 FOLFIRINOX-not relieved  with Reglan    Disposition: David [name] has metastatic pancreas cancer.  He has completed 7 cycles of FOLFIRINOX.  He has  tolerated the treatment well.  The restaging CTs are consistent with a response to treatment, especially given the 82-month interval from the baseline imaging to the start of chemotherapy.  The plan is to continue FOLFIRINOX.  He has minimal neuropathy symptoms.  Dr. Berton Mount recommends a trial of chlorpromazine for hiccups.  He will discontinue Compazine and Ambien.  He will take chlorpromazine for 5 days following chemotherapy.  David [name] will return for an office visit and chemotherapy in 2 weeks.  David Papas, MD  09/25/2023  9:53 AM

## 2023-09-25 NOTE — Patient Instructions (Signed)

## 2023-09-26 ENCOUNTER — Other Ambulatory Visit: Payer: Self-pay

## 2023-09-26 ENCOUNTER — Inpatient Hospital Stay
Admission: RE | Admit: 2023-09-26 | Discharge: 2023-09-26 | Disposition: A | Discharge status: Home / Self Care | Payer: Self-pay | Source: Ambulatory Visit | Attending: Oncology | Admitting: Oncology

## 2023-09-26 ENCOUNTER — Other Ambulatory Visit: Payer: Self-pay | Admitting: *Deleted

## 2023-09-26 DIAGNOSIS — C259 Malignant neoplasm of pancreas, unspecified: Secondary | ICD-10-CM

## 2023-09-26 LAB — CANCER ANTIGEN 19-9: CA 19-9: 552 U/mL — ABNORMAL HIGH (ref 0–35)

## 2023-09-26 NOTE — Progress Notes (Signed)
Request and orders sent to [facility] for uploading of PET 6/27 and CT 9/30

## 2023-09-27 ENCOUNTER — Inpatient Hospital Stay: Payer: No Typology Code available for payment source

## 2023-09-27 VITALS — BP 116/76 | HR 87 | Temp 98.1°F | Resp 18

## 2023-09-27 DIAGNOSIS — C259 Malignant neoplasm of pancreas, unspecified: Secondary | ICD-10-CM

## 2023-09-27 DIAGNOSIS — Z5111 Encounter for antineoplastic chemotherapy: Secondary | ICD-10-CM | POA: Diagnosis not present

## 2023-09-27 MED ORDER — SODIUM CHLORIDE 0.9% FLUSH
10.0000 mL | INTRAVENOUS | Status: DC | PRN
  Administered 2023-09-27: 10 mL

## 2023-09-27 MED ORDER — PEGFILGRASTIM-JMDB 6 MG/0.6ML ~~LOC~~ SOSY
6.0000 mg | PREFILLED_SYRINGE | Freq: Once | SUBCUTANEOUS | Status: AC
  Administered 2023-09-27: 6 mg via SUBCUTANEOUS
  Filled 2023-09-27: qty 0.6

## 2023-09-27 MED ORDER — HEPARIN SOD (PORK) LOCK FLUSH 100 UNIT/ML IV SOLN
500.0000 [IU] | Freq: Once | INTRAVENOUS | Status: AC | PRN
  Administered 2023-09-27: 500 [IU]

## 2023-09-27 NOTE — Patient Instructions (Signed)
Implanted Port Home Guide An implanted port is a device that is placed under the skin. It is usually placed in the chest. The device may vary based on the need. Implanted ports can be used to give IV medicine, to take blood, or to give fluids. You may have an implanted port if: You need IV medicine that would be irritating to the small veins in your hands or arms. You need IV medicines, such as chemotherapy, for a long period of time. You need IV nutrition for a long period of time. You may have fewer limitations when using a port than you would if you used other types of long-term IVs. You will also likely be able to return to normal activities after your incision heals. An implanted port has two main parts: Reservoir. The reservoir is the part where a needle is inserted to give medicines or draw blood. The reservoir is round. After the port is placed, it appears as a small, raised area under your skin. Catheter. The catheter is a small, thin tube that connects the reservoir to a vein. Medicine that is inserted into the reservoir goes into the catheter and then into the vein. How is my port accessed? To access your port: A numbing cream may be placed on the skin over the port site. Your health care provider will put on a mask and sterile gloves. The skin over your port will be cleaned carefully with a germ-killing soap and allowed to dry. Your health care provider will gently pinch the port and insert a needle into it. Your health care provider will check for a blood return to make sure the port is in the vein and is still working (patent). If your port needs to remain accessed to get medicine continuously (constant infusion), your health care provider will place a clear bandage (dressing) over the needle site. The dressing and needle will need to be changed every week, or as told by your health care provider. What is flushing? Flushing helps keep the port working. Follow instructions from your  health care provider about how and when to flush the port. Ports are usually flushed with saline solution or a medicine called heparin. The need for flushing will depend on how the port is used: If the port is only used from time to time to give medicines or draw blood, the port may need to be flushed: Before and after medicines have been given. Before and after blood has been drawn. As part of routine maintenance. Flushing may be recommended every 4-6 weeks. If a constant infusion is running, the port may not need to be flushed. Throw away any syringes in a disposal container that is meant for sharp items (sharps container). You can buy a sharps container from a pharmacy, or you can make one by using an empty hard plastic bottle with a cover. How long will my port stay implanted? The port can stay in for as long as your health care provider thinks it is needed. When it is time for the port to come out, a surgery will be done to remove it. The surgery will be similar to the procedure that was done to put the port in. Follow these instructions at home: Caring for your port and port site Flush your port as told by your health care provider. If you need an infusion over several days, follow instructions from your health care provider about how to take care of your port site. Make sure you: Change your   dressing as told by your health care provider. Wash your hands with soap and water for at least 20 seconds before and after you change your dressing. If soap and water are not available, use alcohol-based hand sanitizer. Place any used dressings or infusion bags into a plastic bag. Throw that bag in the trash. Keep the dressing that covers the needle clean and dry. Do not get it wet. Do not use scissors or sharp objects near the infusion tubing. Keep any external tubes clamped, unless they are being used. Check your port site every day for signs of infection. Check for: Redness, swelling, or  pain. Fluid or blood. Warmth. Pus or a bad smell. Protect the skin around the port site. Avoid wearing bra straps that rub or irritate the site. Protect the skin around your port from seat belts. Place a soft pad over your chest if needed. Bathe or shower as told by your health care provider. The site may get wet as long as you are not actively receiving an infusion. General instructions  Return to your normal activities as told by your health care provider. Ask your health care provider what activities are safe for you. Carry a medical alert card or wear a medical alert bracelet at all times. This will let health care providers know that you have an implanted port in case of an emergency. Where to find more information American Cancer Society: www.cancer.org American Society of Clinical Oncology: www.cancer.net Contact a health care provider if: You have a fever or chills. You have redness, swelling, or pain at the port site. You have fluid or blood coming from your port site. Your incision feels warm to the touch. You have pus or a bad smell coming from the port site. Summary Implanted ports are usually placed in the chest for long-term IV access. Follow instructions from your health care provider about flushing the port and changing bandages (dressings). Take care of the area around your port by avoiding clothing that puts pressure on the area, and by watching for signs of infection. Protect the skin around your port from seat belts. Place a soft pad over your chest if needed. Contact a health care provider if you have a fever or you have redness, swelling, pain, fluid, or a bad smell at the port site. This information is not intended to replace advice given to you by your health care provider. Make sure you discuss any questions you have with your health care provider. Document Revised: 06/06/2021 Document Reviewed: 06/06/2021 Elsevier Patient Education  2024 Elsevier  Inc.  Pegfilgrastim Injection What is this medication? PEGFILGRASTIM (PEG fil gra stim) lowers the risk of infection in people who are receiving chemotherapy. It works by Systems analyst make more white blood cells, which protects your body from infection. It may also be used to help people who have been exposed to high doses of radiation. This medicine may be used for other purposes; ask your health care provider or pharmacist if you have questions. COMMON BRAND NAME(S): Cherly Hensen, Neulasta, Nyvepria, Stimufend, UDENYCA, UDENYCA ONBODY, Ziextenzo What should I tell my care team before I take this medication? They need to know if you have any of these conditions: Kidney disease Latex allergy Ongoing radiation therapy Sickle cell disease Skin reactions to acrylic adhesives (On-Body Injector only) An unusual or allergic reaction to pegfilgrastim, filgrastim, other medications, foods, dyes, or preservatives Pregnant or trying to get pregnant Breast-feeding How should I use this medication? This medication is for injection  under the skin. If you get this medication at home, you will be taught how to prepare and give the pre-filled syringe or how to use the On-body Injector. Refer to the patient Instructions for Use for detailed instructions. Use exactly as directed. Tell your care team immediately if you suspect that the On-body Injector may not have performed as intended or if you suspect the use of the On-body Injector resulted in a missed or partial dose. It is important that you put your used needles and syringes in a special sharps container. Do not put them in a trash can. If you do not have a sharps container, call your pharmacist or care team to get one. Talk to your care team about the use of this medication in children. While this medication may be prescribed for selected conditions, precautions do apply. Overdosage: If you think you have taken too much of this medicine contact  a poison control center or emergency room at once. NOTE: This medicine is only for you. Do not share this medicine with others. What if I miss a dose? It is important not to miss your dose. Call your care team if you miss your dose. If you miss a dose due to an On-body Injector failure or leakage, a new dose should be administered as soon as possible using a single prefilled syringe for manual use. What may interact with this medication? Interactions have not been studied. This list may not describe all possible interactions. Give your health care provider a list of all the medicines, herbs, non-prescription drugs, or dietary supplements you use. Also tell them if you smoke, drink alcohol, or use illegal drugs. Some items may interact with your medicine. What should I watch for while using this medication? Your condition will be monitored carefully while you are receiving this medication. You may need blood work done while you are taking this medication. Talk to your care team about your risk of cancer. You may be more at risk for certain types of cancer if you take this medication. If you are going to need a MRI, CT scan, or other procedure, tell your care team that you are using this medication (On-Body Injector only). What side effects may I notice from receiving this medication? Side effects that you should report to your care team as soon as possible: Allergic reactions--skin rash, itching, hives, swelling of the face, lips, tongue, or throat Capillary leak syndrome--stomach or muscle pain, unusual weakness or fatigue, feeling faint or lightheaded, decrease in the amount of urine, swelling of the ankles, hands, or feet, trouble breathing High white blood cell level--fever, fatigue, trouble breathing, night sweats, change in vision, weight loss Inflammation of the aorta--fever, fatigue, back, chest, or stomach pain, severe headache Kidney injury (glomerulonephritis)--decrease in the amount of  urine, red or dark brown urine, foamy or bubbly urine, swelling of the ankles, hands, or feet Shortness of breath or trouble breathing Spleen injury--pain in upper left stomach or shoulder Unusual bruising or bleeding Side effects that usually do not require medical attention (report to your care team if they continue or are bothersome): Bone pain Pain in the hands or feet This list may not describe all possible side effects. Call your doctor for medical advice about side effects. You may report side effects to FDA at 1-800-FDA-1088. Where should I keep my medication? Keep out of the reach of children. If you are using this medication at home, you will be instructed on how to store it. Throw away  any unused medication after the expiration date on the label. NOTE: This sheet is a summary. It may not cover all possible information. If you have questions about this medicine, talk to your doctor, pharmacist, or health care provider.  2024 Elsevier/Gold Standard (2021-11-03 00:00:00)

## 2023-10-05 ENCOUNTER — Other Ambulatory Visit: Payer: Self-pay | Admitting: Oncology

## 2023-10-05 DIAGNOSIS — C259 Malignant neoplasm of pancreas, unspecified: Secondary | ICD-10-CM

## 2023-10-09 ENCOUNTER — Encounter: Payer: Self-pay | Admitting: Oncology

## 2023-10-09 ENCOUNTER — Inpatient Hospital Stay (HOSPITAL_BASED_OUTPATIENT_CLINIC_OR_DEPARTMENT_OTHER): Payer: No Typology Code available for payment source | Admitting: Oncology

## 2023-10-09 ENCOUNTER — Inpatient Hospital Stay: Payer: No Typology Code available for payment source

## 2023-10-09 ENCOUNTER — Other Ambulatory Visit: Payer: Self-pay | Admitting: *Deleted

## 2023-10-09 ENCOUNTER — Encounter: Payer: Self-pay | Admitting: *Deleted

## 2023-10-09 ENCOUNTER — Other Ambulatory Visit (HOSPITAL_BASED_OUTPATIENT_CLINIC_OR_DEPARTMENT_OTHER): Payer: Self-pay

## 2023-10-09 VITALS — BP 132/86 | HR 83 | Temp 98.2°F | Resp 18 | Ht 68.0 in | Wt 160.3 lb

## 2023-10-09 DIAGNOSIS — C259 Malignant neoplasm of pancreas, unspecified: Secondary | ICD-10-CM | POA: Diagnosis not present

## 2023-10-09 DIAGNOSIS — C787 Secondary malignant neoplasm of liver and intrahepatic bile duct: Secondary | ICD-10-CM

## 2023-10-09 DIAGNOSIS — Z5111 Encounter for antineoplastic chemotherapy: Secondary | ICD-10-CM | POA: Diagnosis not present

## 2023-10-09 LAB — CMP (CANCER CENTER ONLY)
ALT: 32 U/L (ref 0–44)
AST: 23 U/L (ref 15–41)
Albumin: 4 g/dL (ref 3.5–5.0)
Alkaline Phosphatase: 222 U/L — ABNORMAL HIGH (ref 38–126)
Anion gap: 7 (ref 5–15)
BUN: 20 mg/dL (ref 6–20)
CO2: 26 mmol/L (ref 22–32)
Calcium: 9.2 mg/dL (ref 8.9–10.3)
Chloride: 101 mmol/L (ref 98–111)
Creatinine: 0.91 mg/dL (ref 0.61–1.24)
GFR, Estimated: >60 mL/min (ref >60)
Glucose, Bld: 308 mg/dL — ABNORMAL HIGH (ref 70–99)
Potassium: 4.3 mmol/L (ref 3.5–5.1)
Sodium: 134 mmol/L — ABNORMAL LOW (ref 135–145)
Total Bilirubin: 0.3 mg/dL (ref 0.3–1.2)
Total Protein: 7.1 g/dL (ref 6.5–8.1)

## 2023-10-09 LAB — CBC WITH DIFFERENTIAL (CANCER CENTER ONLY)
Abs Immature Granulocytes: 0.18 10*3/uL — ABNORMAL HIGH (ref 0.00–0.07)
Basophils Absolute: 0 10*3/uL (ref 0.0–0.1)
Basophils Relative: 1 %
Eosinophils Absolute: 0.1 10*3/uL (ref 0.0–0.5)
Eosinophils Relative: 1 %
HCT: 37.8 % — ABNORMAL LOW (ref 39.0–52.0)
Hemoglobin: 13.2 g/dL (ref 13.0–17.0)
Immature Granulocytes: 3 %
Lymphocytes Relative: 18 %
Lymphs Abs: 1.3 10*3/uL (ref 0.7–4.0)
MCH: 34 pg (ref 26.0–34.0)
MCHC: 34.9 g/dL (ref 30.0–36.0)
MCV: 97.4 fL (ref 80.0–100.0)
Monocytes Absolute: 0.8 10*3/uL (ref 0.1–1.0)
Monocytes Relative: 12 %
Neutro Abs: 4.7 10*3/uL (ref 1.7–7.7)
Neutrophils Relative %: 65 %
Platelet Count: 177 10*3/uL (ref 150–400)
RBC: 3.88 MIL/uL — ABNORMAL LOW (ref 4.22–5.81)
RDW: 16 % — ABNORMAL HIGH (ref 11.5–15.5)
WBC Count: 7.1 10*3/uL (ref 4.0–10.5)
nRBC: 0 % (ref 0.0–0.2)

## 2023-10-09 MED ORDER — NYSTATIN 100000 UNIT/ML MT SUSP
Freq: Two times a day (BID) | OROMUCOSAL | 5 refills | Status: AC
  Filled 2023-10-09: qty 240, 12d supply, fill #0
  Filled 2023-11-05: qty 240, 12d supply, fill #1
  Filled 2023-12-06: qty 240, 12d supply, fill #2
  Filled 2024-09-23: qty 240, 12d supply, fill #3

## 2023-10-09 MED ORDER — SODIUM CHLORIDE 0.9 % IV SOLN
2400.0000 mg/m2 | INTRAVENOUS | Status: DC
  Administered 2023-10-09: 4450 mg via INTRAVENOUS
  Filled 2023-10-09: qty 89

## 2023-10-09 MED ORDER — NYSTATIN 100000 UNIT/ML MT SUSP: 5.0000 mL | Freq: Four times a day (QID) | OROMUCOSAL | 5 refills | Status: DC

## 2023-10-09 MED ORDER — SODIUM CHLORIDE 0.9 % IV SOLN
150.0000 mg | Freq: Once | INTRAVENOUS | Status: AC
  Administered 2023-10-09: 150 mg via INTRAVENOUS
  Filled 2023-10-09: qty 5

## 2023-10-09 MED ORDER — ATROPINE SULFATE 1 MG/ML IV SOLN
0.5000 mg | Freq: Once | INTRAVENOUS | Status: AC | PRN
  Administered 2023-10-09: 0.5 mg via INTRAVENOUS
  Filled 2023-10-09: qty 1

## 2023-10-09 MED ORDER — DEXTROSE 5 % IV SOLN
65.0000 mg/m2 | Freq: Once | INTRAVENOUS | Status: AC
  Administered 2023-10-09: 120 mg via INTRAVENOUS
  Filled 2023-10-09: qty 20

## 2023-10-09 MED ORDER — SODIUM CHLORIDE 0.9% FLUSH
10.0000 mL | INTRAVENOUS | Status: DC | PRN
  Administered 2023-10-09: 10 mL

## 2023-10-09 MED ORDER — DEXAMETHASONE SODIUM PHOSPHATE 10 MG/ML IJ SOLN
5.0000 mg | Freq: Once | INTRAMUSCULAR | Status: AC
  Administered 2023-10-09: 5 mg via INTRAVENOUS
  Filled 2023-10-09: qty 1

## 2023-10-09 MED ORDER — SODIUM CHLORIDE 0.9 % IV SOLN
400.0000 mg/m2 | Freq: Once | INTRAVENOUS | Status: AC
  Administered 2023-10-09: 744 mg via INTRAVENOUS
  Filled 2023-10-09: qty 37.2

## 2023-10-09 MED ORDER — IRINOTECAN HCL CHEMO INJECTION 100 MG/5ML
150.0000 mg/m2 | Freq: Once | INTRAVENOUS | Status: AC
  Administered 2023-10-09: 300 mg via INTRAVENOUS
  Filled 2023-10-09: qty 15

## 2023-10-09 MED ORDER — PALONOSETRON HCL INJECTION 0.25 MG/5ML
0.2500 mg | Freq: Once | INTRAVENOUS | Status: AC
  Administered 2023-10-09: 0.25 mg via INTRAVENOUS
  Filled 2023-10-09: qty 5

## 2023-10-09 MED ORDER — DEXTROSE 5 % IV SOLN: Freq: Once | INTRAVENOUS | Status: AC

## 2023-10-09 NOTE — Progress Notes (Signed)
[facility] OFFICE PROGRESS NOTE   Diagnosis: Pancreas cancer  INTERVAL HISTORY:   David [name] returns as scheduled.  Complete another cycle of FOLFIRINOX 09/25/2023.  No nausea/vomiting or diarrhea.  Hiccups lasted for 2 days following chemotherapy and were improved with Thorazine.  He reports cold sensitivity lasting for several days following chemotherapy.  No neuropathy symptoms at present.  He had sores at the tongue following chemotherapy.  Objective:  Vital signs in last 24 hours:  Blood pressure 132/86, pulse 83, temperature 98.2 F (36.8 C), resp. rate 18, height 5\' 8"  (1.727 m), weight 160 lb 4.8 oz (72.7 kg), SpO2 100%.    HEENT: No thrush, resolving ulcers at the left side of the tongue Resp: Clear bilaterally Cardio: Regular rate and rhythm GI: No hepatosplenomegaly, nontender Vascular: No leg edema Neuro: Mild loss of vibratory sense at the fingertips bilaterally   Portacath/PICC-without erythema  Lab Results:  Lab Results  Component Value Date   WBC 7.1 10/09/2023   HGB 13.2 10/09/2023   HCT 37.8 (L) 10/09/2023   MCV 97.4 10/09/2023   PLT 177 10/09/2023   NEUTROABS 4.7 10/09/2023    CMP  Lab Results  Component Value Date   NA 134 (L) 10/09/2023   K 4.3 10/09/2023   CL 101 10/09/2023   CO2 26 10/09/2023   GLUCOSE 308 (H) 10/09/2023   BUN 20 10/09/2023   CREATININE 0.91 10/09/2023   CALCIUM 9.2 10/09/2023   PROT 7.1 10/09/2023   ALBUMIN 4.0 10/09/2023   AST 23 10/09/2023   ALT 32 10/09/2023   ALKPHOS 222 (H) 10/09/2023   BILITOT 0.3 10/09/2023   GFRNONAA >60 10/09/2023   GFRAA >60 03/26/2018    Lab Results  Component Value Date   CEA 772.45 (H) 05/02/2023   CAN199 552 (H) 09/25/2023    Lab Results  Component Value Date   INR 0.9 05/10/2023   LABPROT 12.7 05/10/2023    Imaging:  No results found.  Medications: I have reviewed the patient's current medications.   Assessment/Plan: Pancreas cancer CT angiogram  chest 04/24/2023-low-density mass in the dome of the liver with at least 3 additional lesions in both hepatic lobes, borderline lymphadenopathy in the hepatoduodenal ligament Right upper quadrant ultrasound 04/24/2023-large circumscribed mass of the liver dome CT abdomen/pelvis 04/24/2023-multiple liver lesions poorly defined on noncontrast exam, prominent portacaval and porta hepatis nodes MRI abdomen 05/05/2023-multiple rim-hypoenhancing liver lesions consistent with hepatic metastases, suspicion for a pancreas tail mass Ultrasound-guided biopsy of dominant right liver lesion 05/10/2023-adenocarcinoma, CK7 and CDX2 positive, abundant cytoplasm and mucin with extracellular mucin, foci of lymphovascular invasion Tempus gene panel-K-ras G12V, T P53 mild tumor mutation burden 3.2, MSS PET 06/04/2023-multiple hypermetabolic liver lesions consistent with metastases, low-level FDG uptake in the soft tissue fullness at the tip of the pancreas tail, low-level FDG activity involving a small soft tissue nodule between the gastric fundus and spleen, tiny foci of accumulation at the right costovertebral junction at T10 and roof of the left acetabulum without an underlying CT lesion Evaded CEA and CA 19-9 EUS 06/07/2023 ,22x 15 mm irregular mass in the tail the pancreas, 11 mm subcarinal lymph node, FNA biopsy of the pancreas mass-suspicious for malignancy Cycle 1 FOLFIRINOX 06/19/2023 Cycle 2 FOLFIRINOX 07/03/2023 Cycle 3 FOLFIRINOX 07/17/2023 Cycle 4 FOLFIRINOX 07/31/2023 Cycle 5 FOLFIRINOX 08/14/2023, oxaliplatin and Decadron dose reduced Cycle 6 FOLFIRINOX 08/28/2023 Cycle 7 FOLFIRINOX 09/11/2023 CTs at [facility] 09/16/2023, compared to 04/24/2023-unchanged pancreas tail mass, decrease in attenuation of hepatic metastases, several lesions have  decreased in size, no new lesions Cycle 8 FOLFIRINOX 09/25/2023 Cycle 9 FOLFIRINOX 10/09/2023 Family history of pancreas cancerINVITAE panel 05/17/2023-NF1 VUS Severe hiccups following cycle  1 FOLFIRINOX, did not respond to baclofen or gabapentin.  Resolved with Reglan.  Recurrent hiccups following cycle 4 FOLFIRINOX-not relieved with Reglan      Disposition: David [name] appears stable.  He is tolerating the FOLFIRINOX well.  Thorazine helped the hiccups.  He will complete another cycle FOLFIRINOX today.  We refilled his prescription for Magic mouthwash.  David [name] will return for an office visit and chemotherapy in 2 weeks.  David Papas, MD  10/09/2023  9:56 AM

## 2023-10-09 NOTE — Progress Notes (Signed)
Patient seen by Dr. Thornton Papas today  Vitals are within treatment parameters:Yes   Labs are within treatment parameters: Yes   Treatment plan has been signed: Yes   Per physician team, Patient is ready for treatment and there are NO modifications to the treatment plan.

## 2023-10-09 NOTE — Patient Instructions (Addendum)

## 2023-10-09 NOTE — Patient Instructions (Addendum)
[facility]   The chemotherapy medication bag should finish at 46 hours, 96 hours, or 7 days. For example, if your pump is scheduled for 46 hours and it was put on at 4:00 p.m., it should finish at 2:00 p.m. the day it is scheduled to come off regardless of your appointment time.     Estimated time to finish at 1:30pm Friday, October 25th, 2024.   If the display on your pump reads "Low Volume" and it is beeping, take the batteries out of the pump and come to the cancer center for it to be taken off.   If the pump alarms go off prior to the pump reading "Low Volume" then call [phone] and someone can assist you.  If the plunger comes out and the chemotherapy medication is leaking out, please use your home chemo spill kit to clean up the spill. Do NOT use paper towels or other household products.  If you have problems or questions regarding your pump, please call either [phone] (24 hours a day) or the cancer center Monday-Friday 8:00 a.m.- 4:30 p.m. at the clinic number and we will assist you. If you are unable to get assistance, then go to the nearest Emergency Department and ask the staff to contact the IV team for assistance.  Discharge Instructions: Thank you for choosing [facility] to provide your oncology and hematology care.   If you have a lab appointment with the Cancer Center, please go directly to the Cancer Center and check in at the registration area.   Wear comfortable clothing and clothing appropriate for easy access to any Portacath or PICC line.   We strive to give you quality time with your provider. You may need to reschedule your appointment if you arrive late (15 or more minutes).  Arriving late affects you and other patients whose appointments are after yours.  Also, if you miss three or more appointments without notifying the office, you may be dismissed from the clinic at the provider's discretion.      For  prescription refill requests, have your pharmacy contact our office and allow 72 hours for refills to be completed.    Today you received the following chemotherapy and/or immunotherapy agents: oxaliplatin, irinotecan, leucovorin, fluorouracil.       To help prevent nausea and vomiting after your treatment, we encourage you to take your nausea medication as directed.  BELOW ARE SYMPTOMS THAT SHOULD BE REPORTED IMMEDIATELY: *FEVER GREATER THAN 100.4 F (38 C) OR HIGHER *CHILLS OR SWEATING *NAUSEA AND VOMITING THAT IS NOT CONTROLLED WITH YOUR NAUSEA MEDICATION *UNUSUAL SHORTNESS OF BREATH *UNUSUAL BRUISING OR BLEEDING *URINARY PROBLEMS (pain or burning when urinating, or frequent urination) *BOWEL PROBLEMS (unusual diarrhea, constipation, pain near the anus) TENDERNESS IN MOUTH AND THROAT WITH OR WITHOUT PRESENCE OF ULCERS (sore throat, sores in mouth, or a toothache) UNUSUAL RASH, SWELLING OR PAIN  UNUSUAL VAGINAL DISCHARGE OR ITCHING   Items with * indicate a potential emergency and should be followed up as soon as possible or go to the Emergency Department if any problems should occur.  Please show the CHEMOTHERAPY ALERT CARD or IMMUNOTHERAPY ALERT CARD at check-in to the Emergency Department and triage nurse.  Should you have questions after your visit or need to cancel or reschedule your appointment, please contact [facility]  Dept: [phone]  and follow the prompts.  Office hours are 8:00 a.m. to 4:30 p.m. Monday - Friday.  Please note that voicemails left after 4:00 p.m. may not be returned until the following business day.  We are closed weekends and major holidays. You have access to a nurse at all times for urgent questions. Please call the main number to the clinic Dept: [phone] and follow the prompts.   For any non-urgent questions, you may also contact your provider using MyChart. We now offer e-Visits for anyone 27 and older to request  care online for non-urgent symptoms. For details visit [email or web address].   Also download the MyChart app! Go to the app store, search "MyChart", open the app, select [facility], and log in with your MyChart username and password.  Oxaliplatin Injection What is this medication? OXALIPLATIN (ox AL i PLA tin) treats colorectal cancer. It works by slowing down the growth of cancer cells. This medicine may be used for other purposes; ask your health care provider or pharmacist if you have questions. COMMON BRAND NAME(S): Eloxatin What should I tell my care team before I take this medication? They need to know if you have any of these conditions: Heart disease History of irregular heartbeat or rhythm Liver disease Low blood cell levels (white cells, red cells, and platelets) Lung or breathing disease, such as asthma Take medications that treat or prevent blood clots Tingling of the fingers, toes, or other nerve disorder An unusual or allergic reaction to oxaliplatin, other medications, foods, dyes, or preservatives If you or your partner are pregnant or trying to get pregnant Breast-feeding How should I use this medication? This medication is injected into a vein. It is given by your care team in a hospital or clinic setting. Talk to your care team about the use of this medication in children. Special care may be needed. Overdosage: If you think you have taken too much of this medicine contact a poison control center or emergency room at once. NOTE: This medicine is only for you. Do not share this medicine with others. What if I miss a dose? Keep appointments for follow-up doses. It is important not to miss a dose. Call your care team if you are unable to keep an appointment. What may interact with this medication? Do not take this medication with any of the following: Cisapride Dronedarone Pimozide Thioridazine This medication may also interact with the following: Aspirin and  aspirin-like medications Certain medications that treat or prevent blood clots, such as warfarin, apixaban, dabigatran, and rivaroxaban Cisplatin Cyclosporine Diuretics Medications for infection, such as acyclovir, adefovir, amphotericin B, bacitracin, cidofovir, foscarnet, ganciclovir, gentamicin, pentamidine, vancomycin NSAIDs, medications for pain and inflammation, such as ibuprofen or naproxen Other medications that cause heart rhythm changes Pamidronate Zoledronic acid This list may not describe all possible interactions. Give your health care provider a list of all the medicines, herbs, non-prescription drugs, or dietary supplements you use. Also tell them if you smoke, drink alcohol, or use illegal drugs. Some items may interact with your medicine. What should I watch for while using this medication? Your condition will be monitored carefully while you are receiving this medication. You may need blood work while taking this medication. This medication may make you feel generally unwell. This is not uncommon as chemotherapy can affect healthy cells as well as cancer cells. Report any side effects. Continue your course of treatment even though you feel ill unless your care team tells you to stop. This medication may increase your risk of getting an infection. Call your care team for advice if you get a fever, chills, sore  throat, or other symptoms of a cold or flu. Do not treat yourself. Try to avoid being around people who are sick. Avoid taking medications that contain aspirin, acetaminophen, ibuprofen, naproxen, or ketoprofen unless instructed by your care team. These medications may hide a fever. Be careful brushing or flossing your teeth or using a toothpick because you may get an infection or bleed more easily. If you have any dental work done, tell your dentist you are receiving this medication. This medication can make you more sensitive to cold. Do not drink cold drinks or use ice.  Cover exposed skin before coming in contact with cold temperatures or cold objects. When out in cold weather wear warm clothing and cover your mouth and nose to warm the air that goes into your lungs. Tell your care team if you get sensitive to the cold. Talk to your care team if you or your partner are pregnant or think either of you might be pregnant. This medication can cause serious birth defects if taken during pregnancy and for 9 months after the last dose. A negative pregnancy test is required before starting this medication. A reliable form of contraception is recommended while taking this medication and for 9 months after the last dose. Talk to your care team about effective forms of contraception. Do not father a child while taking this medication and for 6 months after the last dose. Use a condom while having sex during this time period. Do not breastfeed while taking this medication and for 3 months after the last dose. This medication may cause infertility. Talk to your care team if you are concerned about your fertility. What side effects may I notice from receiving this medication? Side effects that you should report to your care team as soon as possible: Allergic reactions--skin rash, itching, hives, swelling of the face, lips, tongue, or throat Bleeding--bloody or black, tar-like stools, vomiting blood or brown material that looks like coffee grounds, red or dark brown urine, small red or purple spots on skin, unusual bruising or bleeding Dry cough, shortness of breath or trouble breathing Heart rhythm changes--fast or irregular heartbeat, dizziness, feeling faint or lightheaded, chest pain, trouble breathing Infection--fever, chills, cough, sore throat, wounds that don't heal, pain or trouble when passing urine, general feeling of discomfort or being unwell Liver injury--right upper belly pain, loss of appetite, nausea, light-colored stool, dark yellow or brown urine, yellowing skin or  eyes, unusual weakness or fatigue Low red blood cell level--unusual weakness or fatigue, dizziness, headache, trouble breathing Muscle injury--unusual weakness or fatigue, muscle pain, dark yellow or brown urine, decrease in amount of urine Pain, tingling, or numbness in the hands or feet Sudden and severe headache, confusion, change in vision, seizures, which may be signs of posterior reversible encephalopathy syndrome (PRES) Unusual bruising or bleeding Side effects that usually do not require medical attention (report to your care team if they continue or are bothersome): Diarrhea Nausea Pain, redness, or swelling with sores inside the mouth or throat Unusual weakness or fatigue Vomiting This list may not describe all possible side effects. Call your doctor for medical advice about side effects. You may report side effects to FDA at 1-800-FDA-1088. Where should I keep my medication? This medication is given in a hospital or clinic. It will not be stored at home. NOTE: This sheet is a summary. It may not cover all possible information. If you have questions about this medicine, talk to your doctor, pharmacist, or health care provider.  2024  Elsevier/Gold Standard (2023-02-10 00:00:00) Irinotecan Injection What is this medication? IRINOTECAN (ir in oh TEE kan) treats some types of cancer. It works by slowing down the growth of cancer cells. This medicine may be used for other purposes; ask your health care provider or pharmacist if you have questions. COMMON BRAND NAME(S): Camptosar What should I tell my care team before I take this medication? They need to know if you have any of these conditions: Dehydration Diarrhea Infection, especially a viral infection, such as chickenpox, cold sores, herpes Liver disease Low blood cell levels (white cells, red cells, and platelets) Low levels of electrolytes, such as calcium, magnesium, or potassium in your blood Recent or ongoing radiation An  unusual or allergic reaction to irinotecan, other medications, foods, dyes, or preservatives If you or your partner are pregnant or trying to get pregnant Breast-feeding How should I use this medication? This medication is injected into a vein. It is given by your care team in a hospital or clinic setting. Talk to your care team about the use of this medication in children. Special care may be needed. Overdosage: If you think you have taken too much of this medicine contact a poison control center or emergency room at once. NOTE: This medicine is only for you. Do not share this medicine with others. What if I miss a dose? Keep appointments for follow-up doses. It is important not to miss your dose. Call your care team if you are unable to keep an appointment. What may interact with this medication? Do not take this medication with any of the following: Cobicistat Itraconazole This medication may also interact with the following: Certain antibiotics, such as clarithromycin, rifampin, rifabutin Certain antivirals for HIV or AIDS Certain medications for fungal infections, such as ketoconazole, posaconazole, voriconazole Certain medications for seizures, such as carbamazepine, phenobarbital, phenytoin Gemfibrozil Nefazodone St. John's wort This list may not describe all possible interactions. Give your health care provider a list of all the medicines, herbs, non-prescription drugs, or dietary supplements you use. Also tell them if you smoke, drink alcohol, or use illegal drugs. Some items may interact with your medicine. What should I watch for while using this medication? Your condition will be monitored carefully while you are receiving this medication. You may need blood work while taking this medication. This medication may make you feel generally unwell. This is not uncommon as chemotherapy can affect healthy cells as well as cancer cells. Report any side effects. Continue your course of  treatment even though you feel ill unless your care team tells you to stop. This medication can cause serious side effects. To reduce the risk, your care team may give you other medications to take before receiving this one. Be sure to follow the directions from your care team. This medication may affect your coordination, reaction time, or judgement. Do not drive or operate machinery until you know how this medication affects you. Sit up or stand slowly to reduce the risk of dizzy or fainting spells. Drinking alcohol with this medication can increase the risk of these side effects. This medication may increase your risk of getting an infection. Call your care team for advice if you get a fever, chills, sore throat, or other symptoms of a cold or flu. Do not treat yourself. Try to avoid being around people who are sick. Avoid taking medications that contain aspirin, acetaminophen, ibuprofen, naproxen, or ketoprofen unless instructed by your care team. These medications may hide a fever. This medication may increase  your risk to bruise or bleed. Call your care team if you notice any unusual bleeding. Be careful brushing or flossing your teeth or using a toothpick because you may get an infection or bleed more easily. If you have any dental work done, tell your dentist you are receiving this medication. Talk to your care team if you or your partner are pregnant or think either of you might be pregnant. This medication can cause serious birth defects if taken during pregnancy and for 6 months after the last dose. You will need a negative pregnancy test before starting this medication. Contraception is recommended while taking this medication and for 6 months after the last dose. Your care team can help you find the option that works for you. Do not father a child while taking this medication and for 3 months after the last dose. Use a condom for contraception during this time period. Do not breastfeed while  taking this medication and for 7 days after the last dose. This medication may cause infertility. Talk to your care team if you are concerned about your fertility. What side effects may I notice from receiving this medication? Side effects that you should report to your care team as soon as possible: Allergic reactions--skin rash, itching, hives, swelling of the face, lips, tongue, or throat Dry cough, shortness of breath or trouble breathing Increased saliva or tears, increased sweating, stomach cramping, diarrhea, small pupils, unusual weakness or fatigue, slow heartbeat Infection--fever, chills, cough, sore throat, wounds that don't heal, pain or trouble when passing urine, general feeling of discomfort or being unwell Kidney injury--decrease in the amount of urine, swelling of the ankles, hands, or feet Low red blood cell level--unusual weakness or fatigue, dizziness, headache, trouble breathing Severe or prolonged diarrhea Unusual bruising or bleeding Side effects that usually do not require medical attention (report to your care team if they continue or are bothersome): Constipation Diarrhea Hair loss Loss of appetite Nausea Stomach pain This list may not describe all possible side effects. Call your doctor for medical advice about side effects. You may report side effects to FDA at 1-800-FDA-1088. Where should I keep my medication? This medication is given in a hospital or clinic. It will not be stored at home. NOTE: This sheet is a summary. It may not cover all possible information. If you have questions about this medicine, talk to your doctor, pharmacist, or health care provider.  2024 Elsevier/Gold Standard (2022-04-16 00:00:00) Leucovorin Injection What is this medication? LEUCOVORIN (loo koe VOR in) prevents side effects from certain medications, such as methotrexate. It works by increasing folate levels. This helps protect healthy cells in your body. It may also be used to  treat anemia caused by low levels of folate. It can also be used with fluorouracil, a type of chemotherapy, to treat colorectal cancer. It works by increasing the effects of fluorouracil in the body. This medicine may be used for other purposes; ask your health care provider or pharmacist if you have questions. What should I tell my care team before I take this medication? They need to know if you have any of these conditions: Anemia from low levels of vitamin B12 in the blood An unusual or allergic reaction to leucovorin, folic acid, other medications, foods, dyes, or preservatives Pregnant or trying to get pregnant Breastfeeding How should I use this medication? This medication is injected into a vein or a muscle. It is given by your care team in a hospital or clinic setting. Talk  to your care team about the use of this medication in children. Special care may be needed. Overdosage: If you think you have taken too much of this medicine contact a poison control center or emergency room at once. NOTE: This medicine is only for you. Do not share this medicine with others. What if I miss a dose? Keep appointments for follow-up doses. It is important not to miss your dose. Call your care team if you are unable to keep an appointment. What may interact with this medication? Capecitabine Fluorouracil Phenobarbital Phenytoin Primidone Trimethoprim;sulfamethoxazole This list may not describe all possible interactions. Give your health care provider a list of all the medicines, herbs, non-prescription drugs, or dietary supplements you use. Also tell them if you smoke, drink alcohol, or use illegal drugs. Some items may interact with your medicine. What should I watch for while using this medication? Your condition will be monitored carefully while you are receiving this medication. This medication may increase the side effects of 5-fluorouracil. Tell your care team if you have diarrhea or mouth  sores that do not get better or that get worse. What side effects may I notice from receiving this medication? Side effects that you should report to your care team as soon as possible: Allergic reactions--skin rash, itching, hives, swelling of the face, lips, tongue, or throat This list may not describe all possible side effects. Call your doctor for medical advice about side effects. You may report side effects to FDA at 1-800-FDA-1088. Where should I keep my medication? This medication is given in a hospital or clinic. It will not be stored at home. NOTE: This sheet is a summary. It may not cover all possible information. If you have questions about this medicine, talk to your doctor, pharmacist, or health care provider.  2024 Elsevier/Gold Standard (2022-05-08 00:00:00) Fluorouracil Injection What is this medication? FLUOROURACIL (flure oh YOOR a sil) treats some types of cancer. It works by slowing down the growth of cancer cells. This medicine may be used for other purposes; ask your health care provider or pharmacist if you have questions. COMMON BRAND NAME(S): Adrucil What should I tell my care team before I take this medication? They need to know if you have any of these conditions: Blood disorders Dihydropyrimidine dehydrogenase (DPD) deficiency Infection, such as chickenpox, cold sores, herpes Kidney disease Liver disease Poor nutrition Recent or ongoing radiation therapy An unusual or allergic reaction to fluorouracil, other medications, foods, dyes, or preservatives If you or your partner are pregnant or trying to get pregnant Breast-feeding How should I use this medication? This medication is injected into a vein. It is administered by your care team in a hospital or clinic setting. Talk to your care team about the use of this medication in children. Special care may be needed. Overdosage: If you think you have taken too much of this medicine contact a poison control  center or emergency room at once. NOTE: This medicine is only for you. Do not share this medicine with others. What if I miss a dose? Keep appointments for follow-up doses. It is important not to miss your dose. Call your care team if you are unable to keep an appointment. What may interact with this medication? Do not take this medication with any of the following: Live virus vaccines This medication may also interact with the following: Medications that treat or prevent blood clots, such as warfarin, enoxaparin, dalteparin This list may not describe all possible interactions. Give your health care  provider a list of all the medicines, herbs, non-prescription drugs, or dietary supplements you use. Also tell them if you smoke, drink alcohol, or use illegal drugs. Some items may interact with your medicine. What should I watch for while using this medication? Your condition will be monitored carefully while you are receiving this medication. This medication may make you feel generally unwell. This is not uncommon as chemotherapy can affect healthy cells as well as cancer cells. Report any side effects. Continue your course of treatment even though you feel ill unless your care team tells you to stop. In some cases, you may be given additional medications to help with side effects. Follow all directions for their use. This medication may increase your risk of getting an infection. Call your care team for advice if you get a fever, chills, sore throat, or other symptoms of a cold or flu. Do not treat yourself. Try to avoid being around people who are sick. This medication may increase your risk to bruise or bleed. Call your care team if you notice any unusual bleeding. Be careful brushing or flossing your teeth or using a toothpick because you may get an infection or bleed more easily. If you have any dental work done, tell your dentist you are receiving this medication. Avoid taking medications that  contain aspirin, acetaminophen, ibuprofen, naproxen, or ketoprofen unless instructed by your care team. These medications may hide a fever. Do not treat diarrhea with over the counter products. Contact your care team if you have diarrhea that lasts more than 2 days or if it is severe and watery. This medication can make you more sensitive to the sun. Keep out of the sun. If you cannot avoid being in the sun, wear protective clothing and sunscreen. Do not use sun lamps, tanning beds, or tanning booths. Talk to your care team if you or your partner wish to become pregnant or think you might be pregnant. This medication can cause serious birth defects if taken during pregnancy and for 3 months after the last dose. A reliable form of contraception is recommended while taking this medication and for 3 months after the last dose. Talk to your care team about effective forms of contraception. Do not father a child while taking this medication and for 3 months after the last dose. Use a condom while having sex during this time period. Do not breastfeed while taking this medication. This medication may cause infertility. Talk to your care team if you are concerned about your fertility. What side effects may I notice from receiving this medication? Side effects that you should report to your care team as soon as possible: Allergic reactions--skin rash, itching, hives, swelling of the face, lips, tongue, or throat Heart attack--pain or tightness in the chest, shoulders, arms, or jaw, nausea, shortness of breath, cold or clammy skin, feeling faint or lightheaded Heart failure--shortness of breath, swelling of the ankles, feet, or hands, sudden weight gain, unusual weakness or fatigue Heart rhythm changes--fast or irregular heartbeat, dizziness, feeling faint or lightheaded, chest pain, trouble breathing High ammonia level--unusual weakness or fatigue, confusion, loss of appetite, nausea, vomiting,  seizures Infection--fever, chills, cough, sore throat, wounds that don't heal, pain or trouble when passing urine, general feeling of discomfort or being unwell Low red blood cell level--unusual weakness or fatigue, dizziness, headache, trouble breathing Pain, tingling, or numbness in the hands or feet, muscle weakness, change in vision, confusion or trouble speaking, loss of balance or coordination, trouble walking, seizures Redness,  swelling, and blistering of the skin over hands and feet Severe or prolonged diarrhea Unusual bruising or bleeding Side effects that usually do not require medical attention (report to your care team if they continue or are bothersome): Dry skin Headache Increased tears Nausea Pain, redness, or swelling with sores inside the mouth or throat Sensitivity to light Vomiting This list may not describe all possible side effects. Call your doctor for medical advice about side effects. You may report side effects to FDA at 1-800-FDA-1088. Where should I keep my medication? This medication is given in a hospital or clinic. It will not be stored at home. NOTE: This sheet is a summary. It may not cover all possible information. If you have questions about this medicine, talk to your doctor, pharmacist, or health care provider.  2024 [employer] (2022-04-10 00:00:00)

## 2023-10-10 ENCOUNTER — Other Ambulatory Visit: Payer: Self-pay

## 2023-10-10 LAB — CANCER ANTIGEN 19-9: CA 19-9: 498 U/mL — ABNORMAL HIGH (ref 0–35)

## 2023-10-11 ENCOUNTER — Inpatient Hospital Stay: Payer: No Typology Code available for payment source

## 2023-10-11 VITALS — BP 126/74 | HR 117 | Temp 98.0°F | Resp 18

## 2023-10-11 DIAGNOSIS — C259 Malignant neoplasm of pancreas, unspecified: Secondary | ICD-10-CM

## 2023-10-11 DIAGNOSIS — Z5111 Encounter for antineoplastic chemotherapy: Secondary | ICD-10-CM | POA: Diagnosis not present

## 2023-10-11 MED ORDER — HEPARIN SOD (PORK) LOCK FLUSH 100 UNIT/ML IV SOLN
500.0000 [IU] | Freq: Once | INTRAVENOUS | Status: AC | PRN
  Administered 2023-10-11: 500 [IU]

## 2023-10-11 MED ORDER — SODIUM CHLORIDE 0.9% FLUSH
10.0000 mL | INTRAVENOUS | Status: DC | PRN
  Administered 2023-10-11: 10 mL

## 2023-10-11 MED ORDER — PEGFILGRASTIM-JMDB 6 MG/0.6ML ~~LOC~~ SOSY
6.0000 mg | PREFILLED_SYRINGE | Freq: Once | SUBCUTANEOUS | Status: AC
Start: 2023-10-11 — End: 2023-10-11
  Administered 2023-10-11: 6 mg via SUBCUTANEOUS
  Filled 2023-10-11: qty 0.6

## 2023-10-11 NOTE — Patient Instructions (Signed)

## 2023-10-13 ENCOUNTER — Encounter: Payer: Self-pay | Admitting: Oncology

## 2023-10-17 ENCOUNTER — Encounter: Payer: Self-pay | Admitting: Oncology

## 2023-10-20 ENCOUNTER — Other Ambulatory Visit: Payer: Self-pay | Admitting: Oncology

## 2023-10-21 ENCOUNTER — Encounter: Payer: Self-pay | Admitting: Oncology

## 2023-10-22 ENCOUNTER — Inpatient Hospital Stay (HOSPITAL_BASED_OUTPATIENT_CLINIC_OR_DEPARTMENT_OTHER): Payer: No Typology Code available for payment source | Admitting: Nurse Practitioner

## 2023-10-22 ENCOUNTER — Inpatient Hospital Stay: Payer: No Typology Code available for payment source | Admitting: Licensed Clinical Social Worker

## 2023-10-22 ENCOUNTER — Encounter: Payer: Self-pay | Admitting: Nurse Practitioner

## 2023-10-22 ENCOUNTER — Inpatient Hospital Stay: Payer: No Typology Code available for payment source

## 2023-10-22 ENCOUNTER — Inpatient Hospital Stay: Payer: No Typology Code available for payment source | Attending: Hematology

## 2023-10-22 VITALS — BP 134/82 | HR 81 | Temp 98.2°F | Resp 18 | Ht 68.0 in | Wt 160.9 lb

## 2023-10-22 DIAGNOSIS — Z5111 Encounter for antineoplastic chemotherapy: Secondary | ICD-10-CM | POA: Diagnosis present

## 2023-10-22 DIAGNOSIS — C252 Malignant neoplasm of tail of pancreas: Secondary | ICD-10-CM | POA: Diagnosis present

## 2023-10-22 DIAGNOSIS — C259 Malignant neoplasm of pancreas, unspecified: Secondary | ICD-10-CM

## 2023-10-22 DIAGNOSIS — C787 Secondary malignant neoplasm of liver and intrahepatic bile duct: Secondary | ICD-10-CM

## 2023-10-22 DIAGNOSIS — Z800 Family history of malignant neoplasm of digestive organs: Secondary | ICD-10-CM | POA: Insufficient documentation

## 2023-10-22 DIAGNOSIS — Z5189 Encounter for other specified aftercare: Secondary | ICD-10-CM | POA: Insufficient documentation

## 2023-10-22 LAB — CBC WITH DIFFERENTIAL (CANCER CENTER ONLY)
Abs Immature Granulocytes: 0.11 10*3/uL — ABNORMAL HIGH (ref 0.00–0.07)
Basophils Absolute: 0 10*3/uL (ref 0.0–0.1)
Basophils Relative: 1 %
Eosinophils Absolute: 0.2 10*3/uL (ref 0.0–0.5)
Eosinophils Relative: 3 %
HCT: 35.9 % — ABNORMAL LOW (ref 39.0–52.0)
Hemoglobin: 12.4 g/dL — ABNORMAL LOW (ref 13.0–17.0)
Immature Granulocytes: 1 %
Lymphocytes Relative: 18 %
Lymphs Abs: 1.4 10*3/uL (ref 0.7–4.0)
MCH: 33.7 pg (ref 26.0–34.0)
MCHC: 34.5 g/dL (ref 30.0–36.0)
MCV: 97.6 fL (ref 80.0–100.0)
Monocytes Absolute: 0.8 10*3/uL (ref 0.1–1.0)
Monocytes Relative: 10 %
Neutro Abs: 5.4 10*3/uL (ref 1.7–7.7)
Neutrophils Relative %: 67 %
Platelet Count: 141 10*3/uL — ABNORMAL LOW (ref 150–400)
RBC: 3.68 MIL/uL — ABNORMAL LOW (ref 4.22–5.81)
RDW: 15 % (ref 11.5–15.5)
WBC Count: 8 10*3/uL (ref 4.0–10.5)
nRBC: 0 % (ref 0.0–0.2)

## 2023-10-22 LAB — CMP (CANCER CENTER ONLY)
ALT: 32 U/L (ref 0–44)
AST: 24 U/L (ref 15–41)
Albumin: 4.1 g/dL (ref 3.5–5.0)
Alkaline Phosphatase: 204 U/L — ABNORMAL HIGH (ref 38–126)
Anion gap: 7 (ref 5–15)
BUN: 15 mg/dL (ref 6–20)
CO2: 26 mmol/L (ref 22–32)
Calcium: 9.6 mg/dL (ref 8.9–10.3)
Chloride: 101 mmol/L (ref 98–111)
Creatinine: 0.78 mg/dL (ref 0.61–1.24)
GFR, Estimated: >60 mL/min (ref >60)
Glucose, Bld: 222 mg/dL — ABNORMAL HIGH (ref 70–99)
Potassium: 4.1 mmol/L (ref 3.5–5.1)
Sodium: 134 mmol/L — ABNORMAL LOW (ref 135–145)
Total Bilirubin: 0.4 mg/dL (ref <1.2)
Total Protein: 6.4 g/dL — ABNORMAL LOW (ref 6.5–8.1)

## 2023-10-22 MED ORDER — DEXTROSE 5 % IV SOLN: Freq: Once | INTRAVENOUS | Status: AC

## 2023-10-22 MED ORDER — SODIUM CHLORIDE 0.9 % IV SOLN
150.0000 mg | Freq: Once | INTRAVENOUS | Status: AC
  Administered 2023-10-22: 150 mg via INTRAVENOUS
  Filled 2023-10-22: qty 5

## 2023-10-22 MED ORDER — ATROPINE SULFATE 1 MG/ML IV SOLN
0.5000 mg | Freq: Once | INTRAVENOUS | Status: AC | PRN
  Administered 2023-10-22: 0.5 mg via INTRAVENOUS
  Filled 2023-10-22: qty 1

## 2023-10-22 MED ORDER — SODIUM CHLORIDE 0.9 % IV SOLN
2400.0000 mg/m2 | INTRAVENOUS | Status: DC
  Administered 2023-10-22: 4450 mg via INTRAVENOUS
  Filled 2023-10-22: qty 89

## 2023-10-22 MED ORDER — OXALIPLATIN CHEMO INJECTION 100 MG/20ML
65.0000 mg/m2 | Freq: Once | INTRAVENOUS | Status: AC
  Administered 2023-10-22: 120 mg via INTRAVENOUS
  Filled 2023-10-22: qty 20

## 2023-10-22 MED ORDER — HEPARIN SOD (PORK) LOCK FLUSH 100 UNIT/ML IV SOLN
500.0000 [IU] | Freq: Once | INTRAVENOUS | Status: DC | PRN
Start: 2023-10-22 — End: 2023-10-22

## 2023-10-22 MED ORDER — SODIUM CHLORIDE 0.9% FLUSH
10.0000 mL | INTRAVENOUS | Status: DC | PRN
Start: 2023-10-22 — End: 2023-10-22
  Administered 2023-10-22: 10 mL

## 2023-10-22 MED ORDER — DEXAMETHASONE SODIUM PHOSPHATE 10 MG/ML IJ SOLN
5.0000 mg | Freq: Once | INTRAMUSCULAR | Status: AC
Start: 2023-10-22 — End: 2023-10-22
  Administered 2023-10-22: 5 mg via INTRAVENOUS
  Filled 2023-10-22: qty 1

## 2023-10-22 MED ORDER — PALONOSETRON HCL INJECTION 0.25 MG/5ML
0.2500 mg | Freq: Once | INTRAVENOUS | Status: AC
  Administered 2023-10-22: 0.25 mg via INTRAVENOUS
  Filled 2023-10-22: qty 5

## 2023-10-22 MED ORDER — LEUCOVORIN CALCIUM INJECTION 350 MG
400.0000 mg/m2 | Freq: Once | INTRAMUSCULAR | Status: AC
  Administered 2023-10-22: 744 mg via INTRAVENOUS
  Filled 2023-10-22: qty 37.2

## 2023-10-22 MED ORDER — IRINOTECAN HCL CHEMO INJECTION 100 MG/5ML
150.0000 mg/m2 | Freq: Once | INTRAVENOUS | Status: AC
  Administered 2023-10-22: 300 mg via INTRAVENOUS
  Filled 2023-10-22: qty 15

## 2023-10-22 NOTE — Patient Instructions (Addendum)
[facility] - A DEPT OF [facility]   The chemotherapy medication bag should finish at 46 hours, 96 hours, or 7 days. For example, if your pump is scheduled for 46 hours and it was put on at 4:00 p.m., it should finish at 2:00 p.m. the day it is scheduled to come off regardless of your appointment time.     Estimated time to finish at 1pm Thursday, November 7th, 2024.   If the display on your pump reads "Low Volume" and it is beeping, take the batteries out of the pump and come to the cancer center for it to be taken off.   If the pump alarms go off prior to the pump reading "Low Volume" then call [phone] and someone can assist you.  If the plunger comes out and the chemotherapy medication is leaking out, please use your home chemo spill kit to clean up the spill. Do NOT use paper towels or other household products.  If you have problems or questions regarding your pump, please call either [phone] (24 hours a day) or the cancer center Monday-Friday 8:00 a.m.- 4:30 p.m. at the clinic number and we will assist you. If you are unable to get assistance, then go to the nearest Emergency Department and ask the staff to contact the IV team for assistance.  Discharge Instructions: Thank you for choosing [facility] to provide your oncology and hematology care.   If you have a lab appointment with the Cancer Center, please go directly to the Cancer Center and check in at the registration area.   Wear comfortable clothing and clothing appropriate for easy access to any Portacath or PICC line.   We strive to give you quality time with your provider. You may need to reschedule your appointment if you arrive late (15 or more minutes).  Arriving late affects you and other patients whose appointments are after yours.  Also, if you miss three or more appointments without notifying the office, you may be dismissed from the clinic at the provider's  discretion.      For prescription refill requests, have your pharmacy contact our office and allow 72 hours for refills to be completed.    Today you received the following chemotherapy and/or immunotherapy agents: oxaliplatin, irinotecan, leucovorin, fluorouracil.       To help prevent nausea and vomiting after your treatment, we encourage you to take your nausea medication as directed.  BELOW ARE SYMPTOMS THAT SHOULD BE REPORTED IMMEDIATELY: *FEVER GREATER THAN 100.4 F (38 C) OR HIGHER *CHILLS OR SWEATING *NAUSEA AND VOMITING THAT IS NOT CONTROLLED WITH YOUR NAUSEA MEDICATION *UNUSUAL SHORTNESS OF BREATH *UNUSUAL BRUISING OR BLEEDING *URINARY PROBLEMS (pain or burning when urinating, or frequent urination) *BOWEL PROBLEMS (unusual diarrhea, constipation, pain near the anus) TENDERNESS IN MOUTH AND THROAT WITH OR WITHOUT PRESENCE OF ULCERS (sore throat, sores in mouth, or a toothache) UNUSUAL RASH, SWELLING OR PAIN  UNUSUAL VAGINAL DISCHARGE OR ITCHING   Items with * indicate a potential emergency and should be followed up as soon as possible or go to the Emergency Department if any problems should occur.  Please show the CHEMOTHERAPY ALERT CARD or IMMUNOTHERAPY ALERT CARD at check-in to the Emergency Department and triage nurse.  Should you have questions after your visit or need to cancel or reschedule your appointment, please contact [facility] - A DE[facility]  Dept: [phone]  and follow the prompts.  Office hours are 8:00 a.m. to 4:30 p.m. Monday - Friday. Please note that voicemails left after 4:00 p.m. may not be returned until the following business day.  We are closed weekends and major holidays. You have access to a nurse at all times for urgent questions. Please call the main number to the clinic Dept: [phone] and follow the prompts.   For any non-urgent questions, you may also contact your provider using MyChart. We now offer  e-Visits for anyone 28 and older to request care online for non-urgent symptoms. For details visit [email or web address].   Also download the MyChart app! Go to the app store, search "MyChart", open the app, select [employer], and log in with your MyChart username and password.  Oxaliplatin Injection What is this medication? OXALIPLATIN (ox AL i PLA tin) treats colorectal cancer. It works by slowing down the growth of cancer cells. This medicine may be used for other purposes; ask your health care provider or pharmacist if you have questions. COMMON BRAND NAME(S): Eloxatin What should I tell my care team before I take this medication? They need to know if you have any of these conditions: Heart disease History of irregular heartbeat or rhythm Liver disease Low blood cell levels (white cells, red cells, and platelets) Lung or breathing disease, such as asthma Take medications that treat or prevent blood clots Tingling of the fingers, toes, or other nerve disorder An unusual or allergic reaction to oxaliplatin, other medications, foods, dyes, or preservatives If you or your partner are pregnant or trying to get pregnant Breast-feeding How should I use this medication? This medication is injected into a vein. It is given by your care team in a hospital or clinic setting. Talk to your care team about the use of this medication in children. Special care may be needed. Overdosage: If you think you have taken too much of this medicine contact a poison control center or emergency room at once. NOTE: This medicine is only for you. Do not share this medicine with others. What if I miss a dose? Keep appointments for follow-up doses. It is important not to miss a dose. Call your care team if you are unable to keep an appointment. What may interact with this medication? Do not take this medication with any of the following: Cisapride Dronedarone Pimozide Thioridazine This medication may also  interact with the following: Aspirin and aspirin-like medications Certain medications that treat or prevent blood clots, such as warfarin, apixaban, dabigatran, and rivaroxaban Cisplatin Cyclosporine Diuretics Medications for infection, such as acyclovir, adefovir, amphotericin B, bacitracin, cidofovir, foscarnet, ganciclovir, gentamicin, pentamidine, vancomycin NSAIDs, medications for pain and inflammation, such as ibuprofen or naproxen Other medications that cause heart rhythm changes Pamidronate Zoledronic acid This list may not describe all possible interactions. Give your health care provider a list of all the medicines, herbs, non-prescription drugs, or dietary supplements you use. Also tell them if you smoke, drink alcohol, or use illegal drugs. Some items may interact with your medicine. What should I watch for while using this medication? Your condition will be monitored carefully while you are receiving this medication. You may need blood work while taking this medication. This medication may make you feel generally unwell. This is not uncommon as chemotherapy can affect healthy cells as well as cancer cells. Report any side effects. Continue your course of treatment even though you feel ill unless your care team tells you to stop. This medication may increase your risk of getting an infection. Call your  care team for advice if you get a fever, chills, sore throat, or other symptoms of a cold or flu. Do not treat yourself. Try to avoid being around people who are sick. Avoid taking medications that contain aspirin, acetaminophen, ibuprofen, naproxen, or ketoprofen unless instructed by your care team. These medications may hide a fever. Be careful brushing or flossing your teeth or using a toothpick because you may get an infection or bleed more easily. If you have any dental work done, tell your dentist you are receiving this medication. This medication can make you more sensitive to  cold. Do not drink cold drinks or use ice. Cover exposed skin before coming in contact with cold temperatures or cold objects. When out in cold weather wear warm clothing and cover your mouth and nose to warm the air that goes into your lungs. Tell your care team if you get sensitive to the cold. Talk to your care team if you or your partner are pregnant or think either of you might be pregnant. This medication can cause serious birth defects if taken during pregnancy and for 9 months after the last dose. A negative pregnancy test is required before starting this medication. A reliable form of contraception is recommended while taking this medication and for 9 months after the last dose. Talk to your care team about effective forms of contraception. Do not father a child while taking this medication and for 6 months after the last dose. Use a condom while having sex during this time period. Do not breastfeed while taking this medication and for 3 months after the last dose. This medication may cause infertility. Talk to your care team if you are concerned about your fertility. What side effects may I notice from receiving this medication? Side effects that you should report to your care team as soon as possible: Allergic reactions--skin rash, itching, hives, swelling of the face, lips, tongue, or throat Bleeding--bloody or black, tar-like stools, vomiting blood or brown material that looks like coffee grounds, red or dark brown urine, small red or purple spots on skin, unusual bruising or bleeding Dry cough, shortness of breath or trouble breathing Heart rhythm changes--fast or irregular heartbeat, dizziness, feeling faint or lightheaded, chest pain, trouble breathing Infection--fever, chills, cough, sore throat, wounds that don't heal, pain or trouble when passing urine, general feeling of discomfort or being unwell Liver injury--right upper belly pain, loss of appetite, nausea, light-colored stool, dark  yellow or brown urine, yellowing skin or eyes, unusual weakness or fatigue Low red blood cell level--unusual weakness or fatigue, dizziness, headache, trouble breathing Muscle injury--unusual weakness or fatigue, muscle pain, dark yellow or brown urine, decrease in amount of urine Pain, tingling, or numbness in the hands or feet Sudden and severe headache, confusion, change in vision, seizures, which may be signs of posterior reversible encephalopathy syndrome (PRES) Unusual bruising or bleeding Side effects that usually do not require medical attention (report to your care team if they continue or are bothersome): Diarrhea Nausea Pain, redness, or swelling with sores inside the mouth or throat Unusual weakness or fatigue Vomiting This list may not describe all possible side effects. Call your doctor for medical advice about side effects. You may report side effects to FDA at 1-800-FDA-1088. Where should I keep my medication? This medication is given in a hospital or clinic. It will not be stored at home. NOTE: This sheet is a summary. It may not cover all possible information. If you have questions about this medicine,  talk to your doctor, pharmacist, or health care provider.  2024 Elsevier/Gold Standard (2023-02-10 00:00:00) Irinotecan Injection What is this medication? IRINOTECAN (ir in oh TEE kan) treats some types of cancer. It works by slowing down the growth of cancer cells. This medicine may be used for other purposes; ask your health care provider or pharmacist if you have questions. COMMON BRAND NAME(S): Camptosar What should I tell my care team before I take this medication? They need to know if you have any of these conditions: Dehydration Diarrhea Infection, especially a viral infection, such as chickenpox, cold sores, herpes Liver disease Low blood cell levels (white cells, red cells, and platelets) Low levels of electrolytes, such as calcium, magnesium, or potassium in  your blood Recent or ongoing radiation An unusual or allergic reaction to irinotecan, other medications, foods, dyes, or preservatives If you or your partner are pregnant or trying to get pregnant Breast-feeding How should I use this medication? This medication is injected into a vein. It is given by your care team in a hospital or clinic setting. Talk to your care team about the use of this medication in children. Special care may be needed. Overdosage: If you think you have taken too much of this medicine contact a poison control center or emergency room at once. NOTE: This medicine is only for you. Do not share this medicine with others. What if I miss a dose? Keep appointments for follow-up doses. It is important not to miss your dose. Call your care team if you are unable to keep an appointment. What may interact with this medication? Do not take this medication with any of the following: Cobicistat Itraconazole This medication may also interact with the following: Certain antibiotics, such as clarithromycin, rifampin, rifabutin Certain antivirals for HIV or AIDS Certain medications for fungal infections, such as ketoconazole, posaconazole, voriconazole Certain medications for seizures, such as carbamazepine, phenobarbital, phenytoin Gemfibrozil Nefazodone St. John's wort This list may not describe all possible interactions. Give your health care provider a list of all the medicines, herbs, non-prescription drugs, or dietary supplements you use. Also tell them if you smoke, drink alcohol, or use illegal drugs. Some items may interact with your medicine. What should I watch for while using this medication? Your condition will be monitored carefully while you are receiving this medication. You may need blood work while taking this medication. This medication may make you feel generally unwell. This is not uncommon as chemotherapy can affect healthy cells as well as cancer cells. Report  any side effects. Continue your course of treatment even though you feel ill unless your care team tells you to stop. This medication can cause serious side effects. To reduce the risk, your care team may give you other medications to take before receiving this one. Be sure to follow the directions from your care team. This medication may affect your coordination, reaction time, or judgement. Do not drive or operate machinery until you know how this medication affects you. Sit up or stand slowly to reduce the risk of dizzy or fainting spells. Drinking alcohol with this medication can increase the risk of these side effects. This medication may increase your risk of getting an infection. Call your care team for advice if you get a fever, chills, sore throat, or other symptoms of a cold or flu. Do not treat yourself. Try to avoid being around people who are sick. Avoid taking medications that contain aspirin, acetaminophen, ibuprofen, naproxen, or ketoprofen unless instructed by your care  team. These medications may hide a fever. This medication may increase your risk to bruise or bleed. Call your care team if you notice any unusual bleeding. Be careful brushing or flossing your teeth or using a toothpick because you may get an infection or bleed more easily. If you have any dental work done, tell your dentist you are receiving this medication. Talk to your care team if you or your partner are pregnant or think either of you might be pregnant. This medication can cause serious birth defects if taken during pregnancy and for 6 months after the last dose. You will need a negative pregnancy test before starting this medication. Contraception is recommended while taking this medication and for 6 months after the last dose. Your care team can help you find the option that works for you. Do not father a child while taking this medication and for 3 months after the last dose. Use a condom for contraception during  this time period. Do not breastfeed while taking this medication and for 7 days after the last dose. This medication may cause infertility. Talk to your care team if you are concerned about your fertility. What side effects may I notice from receiving this medication? Side effects that you should report to your care team as soon as possible: Allergic reactions--skin rash, itching, hives, swelling of the face, lips, tongue, or throat Dry cough, shortness of breath or trouble breathing Increased saliva or tears, increased sweating, stomach cramping, diarrhea, small pupils, unusual weakness or fatigue, slow heartbeat Infection--fever, chills, cough, sore throat, wounds that don't heal, pain or trouble when passing urine, general feeling of discomfort or being unwell Kidney injury--decrease in the amount of urine, swelling of the ankles, hands, or feet Low red blood cell level--unusual weakness or fatigue, dizziness, headache, trouble breathing Severe or prolonged diarrhea Unusual bruising or bleeding Side effects that usually do not require medical attention (report to your care team if they continue or are bothersome): Constipation Diarrhea Hair loss Loss of appetite Nausea Stomach pain This list may not describe all possible side effects. Call your doctor for medical advice about side effects. You may report side effects to FDA at 1-800-FDA-1088. Where should I keep my medication? This medication is given in a hospital or clinic. It will not be stored at home. NOTE: This sheet is a summary. It may not cover all possible information. If you have questions about this medicine, talk to your doctor, pharmacist, or health care provider.  2024 Elsevier/Gold Standard (2022-04-16 00:00:00) Leucovorin Injection What is this medication? LEUCOVORIN (loo koe VOR in) prevents side effects from certain medications, such as methotrexate. It works by increasing folate levels. This helps protect healthy  cells in your body. It may also be used to treat anemia caused by low levels of folate. It can also be used with fluorouracil, a type of chemotherapy, to treat colorectal cancer. It works by increasing the effects of fluorouracil in the body. This medicine may be used for other purposes; ask your health care provider or pharmacist if you have questions. What should I tell my care team before I take this medication? They need to know if you have any of these conditions: Anemia from low levels of vitamin B12 in the blood An unusual or allergic reaction to leucovorin, folic acid, other medications, foods, dyes, or preservatives Pregnant or trying to get pregnant Breastfeeding How should I use this medication? This medication is injected into a vein or a muscle. It is given  by your care team in a hospital or clinic setting. Talk to your care team about the use of this medication in children. Special care may be needed. Overdosage: If you think you have taken too much of this medicine contact a poison control center or emergency room at once. NOTE: This medicine is only for you. Do not share this medicine with others. What if I miss a dose? Keep appointments for follow-up doses. It is important not to miss your dose. Call your care team if you are unable to keep an appointment. What may interact with this medication? Capecitabine Fluorouracil Phenobarbital Phenytoin Primidone Trimethoprim;sulfamethoxazole This list may not describe all possible interactions. Give your health care provider a list of all the medicines, herbs, non-prescription drugs, or dietary supplements you use. Also tell them if you smoke, drink alcohol, or use illegal drugs. Some items may interact with your medicine. What should I watch for while using this medication? Your condition will be monitored carefully while you are receiving this medication. This medication may increase the side effects of 5-fluorouracil. Tell your  care team if you have diarrhea or mouth sores that do not get better or that get worse. What side effects may I notice from receiving this medication? Side effects that you should report to your care team as soon as possible: Allergic reactions--skin rash, itching, hives, swelling of the face, lips, tongue, or throat This list may not describe all possible side effects. Call your doctor for medical advice about side effects. You may report side effects to FDA at 1-800-FDA-1088. Where should I keep my medication? This medication is given in a hospital or clinic. It will not be stored at home. NOTE: This sheet is a summary. It may not cover all possible information. If you have questions about this medicine, talk to your doctor, pharmacist, or health care provider.  2024 Elsevier/Gold Standard (2022-05-08 00:00:00) Fluorouracil Injection What is this medication? FLUOROURACIL (flure oh YOOR a sil) treats some types of cancer. It works by slowing down the growth of cancer cells. This medicine may be used for other purposes; ask your health care provider or pharmacist if you have questions. COMMON BRAND NAME(S): Adrucil What should I tell my care team before I take this medication? They need to know if you have any of these conditions: Blood disorders Dihydropyrimidine dehydrogenase (DPD) deficiency Infection, such as chickenpox, cold sores, herpes Kidney disease Liver disease Poor nutrition Recent or ongoing radiation therapy An unusual or allergic reaction to fluorouracil, other medications, foods, dyes, or preservatives If you or your partner are pregnant or trying to get pregnant Breast-feeding How should I use this medication? This medication is injected into a vein. It is administered by your care team in a hospital or clinic setting. Talk to your care team about the use of this medication in children. Special care may be needed. Overdosage: If you think you have taken too much of this  medicine contact a poison control center or emergency room at once. NOTE: This medicine is only for you. Do not share this medicine with others. What if I miss a dose? Keep appointments for follow-up doses. It is important not to miss your dose. Call your care team if you are unable to keep an appointment. What may interact with this medication? Do not take this medication with any of the following: Live virus vaccines This medication may also interact with the following: Medications that treat or prevent blood clots, such as warfarin, enoxaparin, dalteparin This  list may not describe all possible interactions. Give your health care provider a list of all the medicines, herbs, non-prescription drugs, or dietary supplements you use. Also tell them if you smoke, drink alcohol, or use illegal drugs. Some items may interact with your medicine. What should I watch for while using this medication? Your condition will be monitored carefully while you are receiving this medication. This medication may make you feel generally unwell. This is not uncommon as chemotherapy can affect healthy cells as well as cancer cells. Report any side effects. Continue your course of treatment even though you feel ill unless your care team tells you to stop. In some cases, you may be given additional medications to help with side effects. Follow all directions for their use. This medication may increase your risk of getting an infection. Call your care team for advice if you get a fever, chills, sore throat, or other symptoms of a cold or flu. Do not treat yourself. Try to avoid being around people who are sick. This medication may increase your risk to bruise or bleed. Call your care team if you notice any unusual bleeding. Be careful brushing or flossing your teeth or using a toothpick because you may get an infection or bleed more easily. If you have any dental work done, tell your dentist you are receiving this  medication. Avoid taking medications that contain aspirin, acetaminophen, ibuprofen, naproxen, or ketoprofen unless instructed by your care team. These medications may hide a fever. Do not treat diarrhea with over the counter products. Contact your care team if you have diarrhea that lasts more than 2 days or if it is severe and watery. This medication can make you more sensitive to the sun. Keep out of the sun. If you cannot avoid being in the sun, wear protective clothing and sunscreen. Do not use sun lamps, tanning beds, or tanning booths. Talk to your care team if you or your partner wish to become pregnant or think you might be pregnant. This medication can cause serious birth defects if taken during pregnancy and for 3 months after the last dose. A reliable form of contraception is recommended while taking this medication and for 3 months after the last dose. Talk to your care team about effective forms of contraception. Do not father a child while taking this medication and for 3 months after the last dose. Use a condom while having sex during this time period. Do not breastfeed while taking this medication. This medication may cause infertility. Talk to your care team if you are concerned about your fertility. What side effects may I notice from receiving this medication? Side effects that you should report to your care team as soon as possible: Allergic reactions--skin rash, itching, hives, swelling of the face, lips, tongue, or throat Heart attack--pain or tightness in the chest, shoulders, arms, or jaw, nausea, shortness of breath, cold or clammy skin, feeling faint or lightheaded Heart failure--shortness of breath, swelling of the ankles, feet, or hands, sudden weight gain, unusual weakness or fatigue Heart rhythm changes--fast or irregular heartbeat, dizziness, feeling faint or lightheaded, chest pain, trouble breathing High ammonia level--unusual weakness or fatigue, confusion, loss of  appetite, nausea, vomiting, seizures Infection--fever, chills, cough, sore throat, wounds that don't heal, pain or trouble when passing urine, general feeling of discomfort or being unwell Low red blood cell level--unusual weakness or fatigue, dizziness, headache, trouble breathing Pain, tingling, or numbness in the hands or feet, muscle weakness, change in vision, confusion or  trouble speaking, loss of balance or coordination, trouble walking, seizures Redness, swelling, and blistering of the skin over hands and feet Severe or prolonged diarrhea Unusual bruising or bleeding Side effects that usually do not require medical attention (report to your care team if they continue or are bothersome): Dry skin Headache Increased tears Nausea Pain, redness, or swelling with sores inside the mouth or throat Sensitivity to light Vomiting This list may not describe all possible side effects. Call your doctor for medical advice about side effects. You may report side effects to FDA at 1-800-FDA-1088. Where should I keep my medication? This medication is given in a hospital or clinic. It will not be stored at home. NOTE: This sheet is a summary. It may not cover all possible information. If you have questions about this medicine, talk to your doctor, pharmacist, or health care provider.  2024 Elsevier/Gold Standard (2022-04-10 00:00:00)

## 2023-10-22 NOTE — Progress Notes (Signed)
Patient seen by Lonna Cobb NP today  Vitals are within treatment parameters:Yes   Labs are within treatment parameters: Yes   Treatment plan has been signed: Yes   Per physician team, Patient is ready for treatment and there are NO modifications to the treatment plan.

## 2023-10-22 NOTE — Progress Notes (Signed)
[facility] Clinical Social Work  Initial Assessment   David [name] is a 57 y.o. year old male accompanied by patient and son. Clinical Social Work was met with patient in infusion for assessment of psychosocial needs.   SDOH (Social Determinants of Health) assessments performed: Yes SDOH Interventions    Flowsheet Row Clinical Support from 10/22/2023 in [facility] [place]  SDOH Interventions   Housing Interventions Intervention Not Indicated       SDOH Screenings   Food Insecurity: No Food Insecurity (02/18/2023)   Received from [facility], [facility]  Housing: Low Risk  (10/22/2023)  Transportation Needs: No Transportation Needs (02/18/2023)   Received from [facility], [facility]  Utilities: Not At Risk (02/18/2023)   Received from [facility]  Depression [phone]): Low Risk  (08/26/2019)  Financial Resource Strain: Low Risk  (02/18/2023)   Received from [facility], [facility]  Physical Activity: Unknown (02/18/2023)   Received from [facility], [facility]  Social Connections: Socially Integrated (02/18/2023)   Received from [facility]  Stress: No Stress Concern Present (02/18/2023)   Received from [facility], [facility]  Tobacco Use: Low Risk  (10/22/2023)  Recent Concern: Tobacco Use - Medium Risk (09/16/2023)   Received from [facility]     Distress Screen completed: No     No data to display            Family/Social Information:  Housing Arrangement: patient lives with his spouse. Family members/support persons in your life? Family Transportation concerns: no  Employment: Works for [employer]. Income source: Employment Financial concerns: No Type of concern: None Food access concerns: no Religious or spiritual practice: Not known Services Currently in place:  [employer]  Coping/ Adjustment to diagnosis: Patient understands treatment plan and what  happens next? yes Concerns about diagnosis and/or treatment: I'm not especially worried about anything Patient reported stressors:  None per patient. Hopes and/or priorities: Family Patient enjoys time with family/ friends Current coping skills/ strengths: Average or above average intelligence , Capable of independent living , Manufacturing systems engineer , Contractor , General fund of knowledge , Motivation for treatment/growth , Supportive family/friends , and Work skills     SUMMARY: Current SDOH Barriers:  None per patient.  Clinical Social Work Clinical Goal(s):  No clinical social work goals at this time  Interventions: Discussed common feeling and emotions when being diagnosed with cancer, and the importance of support during treatment Informed patient of the support team roles and support services at [facility] Provided CSW contact information and encouraged patient to call with any questions or concerns Provided patient with information about the [employer] and gave him the brochure with CSW contact information.  Discussed massage and he said he was interested.   Follow Up Plan: Patient will contact CSW with any support or resource needs Patient verbalizes understanding of plan: Yes    Dorothey Baseman, LCSW Clinical Social Worker [facility]

## 2023-10-22 NOTE — Progress Notes (Signed)
[facility] OFFICE PROGRESS NOTE   Diagnosis: Pancreas cancer  INTERVAL HISTORY:   David [name] returns as scheduled.  He completed cycle 9 FOLFIRINOX 10/09/2023.  He denies nausea/vomiting.  No mouth sores.  He notes a "swollen" area at the upper inner gumline at the site of a recent root canal.  No diarrhea.  Cold sensitivity lasted 3 to 4 days.  No persistent neuropathy symptoms.  Hiccups were less intense, lasted for 3 days.  He notes that Thorazine makes him tired. Objective:  Vital signs in last 24 hours:  Blood pressure 134/82, pulse 81, temperature 98.2 F (36.8 C), temperature source Temporal, resp. rate 18, height 5\' 8"  (1.727 m), weight 160 lb 14.4 oz (73 kg), SpO2 100%.    HEENT: No thrush or ulcers.  Left upper inner mid gumline with a small edematous area. Resp: Lungs clear bilaterally. Cardio: Regular rate and rhythm. GI: No hepatosplenomegaly. Vascular: No leg edema. Neuro: Vibratory sense mildly decreased over the fingertips for tuning fork exam. Skin: Palms without erythema. Port-A-Cath without erythema.  Lab Results:  Lab Results  Component Value Date   WBC 8.0 10/22/2023   HGB 12.4 (L) 10/22/2023   HCT 35.9 (L) 10/22/2023   MCV 97.6 10/22/2023   PLT 141 (L) 10/22/2023   NEUTROABS 5.4 10/22/2023    Imaging:  No results found.  Medications: I have reviewed the patient's current medications.  Assessment/Plan: Pancreas cancer CT angiogram chest 04/24/2023-low-density mass in the dome of the liver with at least 3 additional lesions in both hepatic lobes, borderline lymphadenopathy in the hepatoduodenal ligament Right upper quadrant ultrasound 04/24/2023-large circumscribed mass of the liver dome CT abdomen/pelvis 04/24/2023-multiple liver lesions poorly defined on noncontrast exam, prominent portacaval and porta hepatis nodes MRI abdomen 05/05/2023-multiple rim-hypoenhancing liver lesions consistent with hepatic metastases, suspicion for a  pancreas tail mass Ultrasound-guided biopsy of dominant right liver lesion 05/10/2023-adenocarcinoma, CK7 and CDX2 positive, abundant cytoplasm and mucin with extracellular mucin, foci of lymphovascular invasion Tempus gene panel-K-ras G12V, T P53 mild tumor mutation burden 3.2, MSS PET 06/04/2023-multiple hypermetabolic liver lesions consistent with metastases, low-level FDG uptake in the soft tissue fullness at the tip of the pancreas tail, low-level FDG activity involving a small soft tissue nodule between the gastric fundus and spleen, tiny foci of accumulation at the right costovertebral junction at T10 and roof of the left acetabulum without an underlying CT lesion Evaded CEA and CA 19-9 EUS 06/07/2023 ,22x 15 mm irregular mass in the tail the pancreas, 11 mm subcarinal lymph node, FNA biopsy of the pancreas mass-suspicious for malignancy Cycle 1 FOLFIRINOX 06/19/2023 Cycle 2 FOLFIRINOX 07/03/2023 Cycle 3 FOLFIRINOX 07/17/2023 Cycle 4 FOLFIRINOX 07/31/2023 Cycle 5 FOLFIRINOX 08/14/2023, oxaliplatin and Decadron dose reduced Cycle 6 FOLFIRINOX 08/28/2023 Cycle 7 FOLFIRINOX 09/11/2023 CTs at [facility] 09/16/2023, compared to 04/24/2023-unchanged pancreas tail mass, decrease in attenuation of hepatic metastases, several lesions have decreased in size, no new lesions Cycle 8 FOLFIRINOX 09/25/2023 Cycle 9 FOLFIRINOX 10/09/2023 Cycle 10 FOLFIRINOX 10/22/2023 Family history of pancreas cancerINVITAE panel 05/17/2023-NF1 VUS Severe hiccups following cycle 1 FOLFIRINOX, did not respond to baclofen or gabapentin.  Resolved with Reglan.  Recurrent hiccups following cycle 4 FOLFIRINOX-not relieved with Reglan  Disposition: [name] appears stable.  He has completed 9 cycles of FOLFIRINOX.  He continues to tolerate chemotherapy well.  There is no clinical evidence of disease progression.  Most recent CA 19-9 was further improved.  Plan to proceed with cycle 10 today as scheduled.  CBC and chemistry panel  reviewed.  Labs  adequate to proceed as above.  He will return for follow-up and treatment in 2 weeks.  We are available to see him sooner if needed.  Lonna Cobb ANP/GNP-BC   10/22/2023  9:19 AM

## 2023-10-23 LAB — CANCER ANTIGEN 19-9: CA 19-9: 400 U/mL — ABNORMAL HIGH (ref 0–35)

## 2023-10-24 ENCOUNTER — Other Ambulatory Visit: Payer: Self-pay

## 2023-10-24 ENCOUNTER — Inpatient Hospital Stay: Payer: No Typology Code available for payment source

## 2023-10-24 VITALS — BP 112/68 | HR 89 | Temp 98.0°F | Resp 18

## 2023-10-24 DIAGNOSIS — C259 Malignant neoplasm of pancreas, unspecified: Secondary | ICD-10-CM | POA: Diagnosis not present

## 2023-10-24 DIAGNOSIS — Z5111 Encounter for antineoplastic chemotherapy: Secondary | ICD-10-CM | POA: Diagnosis not present

## 2023-10-24 MED ORDER — HEPARIN SOD (PORK) LOCK FLUSH 100 UNIT/ML IV SOLN
500.0000 [IU] | Freq: Once | INTRAVENOUS | Status: AC | PRN
  Administered 2023-10-24: 500 [IU]

## 2023-10-24 MED ORDER — SODIUM CHLORIDE 0.9% FLUSH
10.0000 mL | INTRAVENOUS | Status: DC | PRN
  Administered 2023-10-24: 10 mL

## 2023-10-24 MED ORDER — PEGFILGRASTIM-JMDB 6 MG/0.6ML ~~LOC~~ SOSY
6.0000 mg | PREFILLED_SYRINGE | Freq: Once | SUBCUTANEOUS | Status: AC
Start: 2023-10-24 — End: 2023-10-24
  Administered 2023-10-24: 6 mg via SUBCUTANEOUS
  Filled 2023-10-24: qty 0.6

## 2023-10-24 NOTE — Patient Instructions (Signed)
Heparin Injection What is this medication? HEPARIN INJECTION (HEP a rin) prevents and treats blood clots. It belongs to a group of medications called blood thinners. This medicine may be used for other purposes; ask your health care provider or pharmacist if you have questions. COMMON BRAND NAME(S): Hep-Lock, Hep-Lock U/P, Hepflush-10, Monoject Prefill Advanced Heparin Lock Flush, SASH Normal Saline and Heparin What should I tell my care team before I take this medication? They need to know if you have any of these conditions: Bleeding disorders, such as hemophilia or low blood platelets Bowel disease or diverticulitis Endocarditis High blood pressure Liver disease Recent surgery Stomach ulcers An unusual or allergic reaction to heparin, other medications, foods, dyes, or preservatives Pregnant, trying to get pregnant, or recently pregnant Breastfeeding How should I use this medication? This medication is injected into a vein or under the skin. It is usually given by your care team in a hospital or clinic setting. If you get this medication at home, you will be taught how to prepare and give it. For your therapy to work as well as possible, take each dose exactly as prescribed on the prescription label. Do not skip doses. Skipping doses or stopping this medication can increase your risk of a blood clot. Keep taking this medication unless your care team tells you to stop. It is important that you put your used needles and syringes in a special sharps container. Do not put them in a trash can. If you do not have a sharps container, call your pharmacist or care team to get one. Talk to your care team about the use of this medication in children. While this medication may be prescribed for children for selected conditions, precautions do apply. Overdosage: If you think you have taken too much of this medicine contact a poison control center or emergency room at once. NOTE: This medicine is only  for you. Do not share this medicine with others. What if I miss a dose? If you miss a dose, take it as soon as you can. If it is almost time for your next dose, take only that dose. Do not take double or extra doses. What may interact with this medication? Do not take this medication with any of the following: Aspirin and aspirin-like medications Mifepristone Medications that treat or prevent blood clots, such as warfarin, enoxaparin, dalteparin Palifermin Protamine This medication may also interact with the following: Dextran Digoxin Hydroxychloroquine Medications for treating colds or allergies Nicotine NSAIDs, medications for pain and inflammation, such as ibuprofen or naproxen Phenylbutazone Tetracycline antibiotics This list may not describe all possible interactions. Give your health care provider a list of all the medicines, herbs, non-prescription drugs, or dietary supplements you use. Also tell them if you smoke, drink alcohol, or use illegal drugs. Some items may interact with your medicine. What should I watch for while using this medication? Visit your care team for regular checks on your progress. You may need blood work while taking this medication. Your condition will be monitored carefully while you are receiving this medication. Wear a medical ID bracelet or chain, and carry a card that describes your disease and details of your medication and dosage times. Notify your care team at once if you have cold, blue hands or feet. If you are going to need surgery or other procedure, tell your care team that you are using this medication. Avoid sports and activities that might cause injury while you are using this medication. Severe falls or injuries can  cause unseen bleeding. Be careful when using sharp tools or knives. Consider using an Neurosurgeon. Take special care brushing or flossing your teeth. Report any injuries, bruising, or red spots on the skin to your care  team. Using this medication for a long time may weaken your bones. The risk of bone fractures may be increased. Talk to your care team about your bone health. You should make sure that you get enough calcium and vitamin D while you are taking this medication. Discuss the foods you eat and the vitamins you take with your care team. Wear a medical ID bracelet or chain. Carry a card that describes your condition. List the medications and doses you take on the card. What side effects may I notice from receiving this medication? Side effects that you should report to your care team as soon as possible: Allergic reactions--skin rash, itching, hives, swelling of the face, lips, tongue, or throat Bleeding--bloody or black, tar-like stools, vomiting blood or brown material that looks like coffee grounds, red or dark brown urine, small red or purple spots on skin, unusual bruising or bleeding Blood clot--pain, swelling, or warmth in the leg, shortness of breath, chest pain Side effects that usually do not require medical attention (report to your care team if they continue or are bothersome): Pain, redness, or irritation at injection site This list may not describe all possible side effects. Call your doctor for medical advice about side effects. You may report side effects to FDA at 1-800-FDA-1088. Where should I keep my medication? Keep out of the reach of children and pets. Store unopened vials at room temperature between 15 and 30 degrees C (59 and 86 degrees F). Do not freeze. Do not use if solution is discolored or particulate matter is present. To get rid of medications that are no longer needed or have expired: Take the medication to a medication take-back program. Check with your pharmacy or law enforcement to find a location. If you cannot return the medication, ask your pharmacist or care team how to get rid of this medication safely. NOTE: This sheet is a summary. It may not cover all possible  information. If you have questions about this medicine, talk to your doctor, pharmacist, or health care provider.  2024 Elsevier/Gold Standard (2022-05-24 00:00:00)   Pegfilgrastim Injection What is this medication? PEGFILGRASTIM (PEG fil gra stim) lowers the risk of infection in people who are receiving chemotherapy. It works by Systems analyst make more white blood cells, which protects your body from infection. It may also be used to help people who have been exposed to high doses of radiation. This medicine may be used for other purposes; ask your health care provider or pharmacist if you have questions. COMMON BRAND NAME(S): Cherly Hensen, Neulasta, Nyvepria, Stimufend, UDENYCA, UDENYCA ONBODY, Ziextenzo What should I tell my care team before I take this medication? They need to know if you have any of these conditions: Kidney disease Latex allergy Ongoing radiation therapy Sickle cell disease Skin reactions to acrylic adhesives (On-Body Injector only) An unusual or allergic reaction to pegfilgrastim, filgrastim, other medications, foods, dyes, or preservatives Pregnant or trying to get pregnant Breast-feeding How should I use this medication? This medication is for injection under the skin. If you get this medication at home, you will be taught how to prepare and give the pre-filled syringe or how to use the On-body Injector. Refer to the patient Instructions for Use for detailed instructions. Use exactly as directed. Tell your  care team immediately if you suspect that the On-body Injector may not have performed as intended or if you suspect the use of the On-body Injector resulted in a missed or partial dose. It is important that you put your used needles and syringes in a special sharps container. Do not put them in a trash can. If you do not have a sharps container, call your pharmacist or care team to get one. Talk to your care team about the use of this medication in  children. While this medication may be prescribed for selected conditions, precautions do apply. Overdosage: If you think you have taken too much of this medicine contact a poison control center or emergency room at once. NOTE: This medicine is only for you. Do not share this medicine with others. What if I miss a dose? It is important not to miss your dose. Call your care team if you miss your dose. If you miss a dose due to an On-body Injector failure or leakage, a new dose should be administered as soon as possible using a single prefilled syringe for manual use. What may interact with this medication? Interactions have not been studied. This list may not describe all possible interactions. Give your health care provider a list of all the medicines, herbs, non-prescription drugs, or dietary supplements you use. Also tell them if you smoke, drink alcohol, or use illegal drugs. Some items may interact with your medicine. What should I watch for while using this medication? Your condition will be monitored carefully while you are receiving this medication. You may need blood work done while you are taking this medication. Talk to your care team about your risk of cancer. You may be more at risk for certain types of cancer if you take this medication. If you are going to need a MRI, CT scan, or other procedure, tell your care team that you are using this medication (On-Body Injector only). What side effects may I notice from receiving this medication? Side effects that you should report to your care team as soon as possible: Allergic reactions--skin rash, itching, hives, swelling of the face, lips, tongue, or throat Capillary leak syndrome--stomach or muscle pain, unusual weakness or fatigue, feeling faint or lightheaded, decrease in the amount of urine, swelling of the ankles, hands, or feet, trouble breathing High white blood cell level--fever, fatigue, trouble breathing, night sweats, change in  vision, weight loss Inflammation of the aorta--fever, fatigue, back, chest, or stomach pain, severe headache Kidney injury (glomerulonephritis)--decrease in the amount of urine, red or dark brown urine, foamy or bubbly urine, swelling of the ankles, hands, or feet Shortness of breath or trouble breathing Spleen injury--pain in upper left stomach or shoulder Unusual bruising or bleeding Side effects that usually do not require medical attention (report to your care team if they continue or are bothersome): Bone pain Pain in the hands or feet This list may not describe all possible side effects. Call your doctor for medical advice about side effects. You may report side effects to FDA at 1-800-FDA-1088. Where should I keep my medication? Keep out of the reach of children. If you are using this medication at home, you will be instructed on how to store it. Throw away any unused medication after the expiration date on the label. NOTE: This sheet is a summary. It may not cover all possible information. If you have questions about this medicine, talk to your doctor, pharmacist, or health care provider.  2024 Elsevier/Gold Standard (2021-11-03 00:00:00)

## 2023-10-27 DIAGNOSIS — Z800 Family history of malignant neoplasm of digestive organs: Secondary | ICD-10-CM | POA: Diagnosis not present

## 2023-10-27 DIAGNOSIS — Z5189 Encounter for other specified aftercare: Secondary | ICD-10-CM | POA: Diagnosis not present

## 2023-10-27 DIAGNOSIS — Z5111 Encounter for antineoplastic chemotherapy: Secondary | ICD-10-CM | POA: Diagnosis present

## 2023-10-27 DIAGNOSIS — C252 Malignant neoplasm of tail of pancreas: Secondary | ICD-10-CM | POA: Diagnosis present

## 2023-10-27 DIAGNOSIS — C787 Secondary malignant neoplasm of liver and intrahepatic bile duct: Secondary | ICD-10-CM | POA: Diagnosis present

## 2023-11-02 ENCOUNTER — Other Ambulatory Visit: Payer: Self-pay | Admitting: Oncology

## 2023-11-05 ENCOUNTER — Inpatient Hospital Stay (HOSPITAL_BASED_OUTPATIENT_CLINIC_OR_DEPARTMENT_OTHER): Payer: No Typology Code available for payment source | Admitting: Oncology

## 2023-11-05 ENCOUNTER — Inpatient Hospital Stay: Payer: No Typology Code available for payment source

## 2023-11-05 ENCOUNTER — Other Ambulatory Visit (HOSPITAL_BASED_OUTPATIENT_CLINIC_OR_DEPARTMENT_OTHER): Payer: Self-pay

## 2023-11-05 VITALS — BP 129/80 | HR 93 | Temp 98.3°F | Resp 18 | Ht 68.0 in | Wt 158.3 lb

## 2023-11-05 DIAGNOSIS — C787 Secondary malignant neoplasm of liver and intrahepatic bile duct: Secondary | ICD-10-CM | POA: Diagnosis not present

## 2023-11-05 DIAGNOSIS — C259 Malignant neoplasm of pancreas, unspecified: Secondary | ICD-10-CM | POA: Diagnosis not present

## 2023-11-05 DIAGNOSIS — Z5111 Encounter for antineoplastic chemotherapy: Secondary | ICD-10-CM | POA: Diagnosis not present

## 2023-11-05 LAB — CMP (CANCER CENTER ONLY)
ALT: 29 U/L (ref 0–44)
AST: 26 U/L (ref 15–41)
Albumin: 4 g/dL (ref 3.5–5.0)
Alkaline Phosphatase: 212 U/L — ABNORMAL HIGH (ref 38–126)
Anion gap: 8 (ref 5–15)
BUN: 17 mg/dL (ref 6–20)
CO2: 27 mmol/L (ref 22–32)
Calcium: 9.9 mg/dL (ref 8.9–10.3)
Chloride: 102 mmol/L (ref 98–111)
Creatinine: 0.8 mg/dL (ref 0.61–1.24)
GFR, Estimated: >60 mL/min (ref >60)
Glucose, Bld: 251 mg/dL — ABNORMAL HIGH (ref 70–99)
Potassium: 3.8 mmol/L (ref 3.5–5.1)
Sodium: 137 mmol/L (ref 135–145)
Total Bilirubin: 0.6 mg/dL (ref <1.2)
Total Protein: 6.9 g/dL (ref 6.5–8.1)

## 2023-11-05 LAB — CBC WITH DIFFERENTIAL (CANCER CENTER ONLY)
Abs Immature Granulocytes: 0.05 10*3/uL (ref 0.00–0.07)
Basophils Absolute: 0 10*3/uL (ref 0.0–0.1)
Basophils Relative: 0 %
Eosinophils Absolute: 0.1 10*3/uL (ref 0.0–0.5)
Eosinophils Relative: 2 %
HCT: 35.1 % — ABNORMAL LOW (ref 39.0–52.0)
Hemoglobin: 12.2 g/dL — ABNORMAL LOW (ref 13.0–17.0)
Immature Granulocytes: 1 %
Lymphocytes Relative: 20 %
Lymphs Abs: 1 10*3/uL (ref 0.7–4.0)
MCH: 34.7 pg — ABNORMAL HIGH (ref 26.0–34.0)
MCHC: 34.8 g/dL (ref 30.0–36.0)
MCV: 99.7 fL (ref 80.0–100.0)
Monocytes Absolute: 0.7 10*3/uL (ref 0.1–1.0)
Monocytes Relative: 13 %
Neutro Abs: 3.3 10*3/uL (ref 1.7–7.7)
Neutrophils Relative %: 64 %
Platelet Count: 103 10*3/uL — ABNORMAL LOW (ref 150–400)
RBC: 3.52 MIL/uL — ABNORMAL LOW (ref 4.22–5.81)
RDW: 14.6 % (ref 11.5–15.5)
WBC Count: 5.2 10*3/uL (ref 4.0–10.5)
nRBC: 0 % (ref 0.0–0.2)

## 2023-11-05 MED ORDER — SODIUM CHLORIDE 0.9 % IV SOLN
150.0000 mg/m2 | Freq: Once | INTRAVENOUS | Status: AC
  Administered 2023-11-05: 300 mg via INTRAVENOUS
  Filled 2023-11-05: qty 15

## 2023-11-05 MED ORDER — ATROPINE SULFATE 1 MG/ML IV SOLN
0.5000 mg | Freq: Once | INTRAVENOUS | Status: AC | PRN
  Administered 2023-11-05: 0.5 mg via INTRAVENOUS
  Filled 2023-11-05: qty 1

## 2023-11-05 MED ORDER — DEXTROSE 5 % IV SOLN: Freq: Once | INTRAVENOUS | Status: AC

## 2023-11-05 MED ORDER — PALONOSETRON HCL INJECTION 0.25 MG/5ML
0.2500 mg | Freq: Once | INTRAVENOUS | Status: AC
  Administered 2023-11-05: 0.25 mg via INTRAVENOUS
  Filled 2023-11-05: qty 5

## 2023-11-05 MED ORDER — SODIUM CHLORIDE 0.9 % IV SOLN
150.0000 mg | Freq: Once | INTRAVENOUS | Status: AC
  Administered 2023-11-05: 150 mg via INTRAVENOUS
  Filled 2023-11-05: qty 5

## 2023-11-05 MED ORDER — FLUOROURACIL CHEMO INJECTION 5 GM/100ML
2400.0000 mg/m2 | INTRAVENOUS | Status: DC
  Administered 2023-11-05: 4450 mg via INTRAVENOUS
  Filled 2023-11-05: qty 89

## 2023-11-05 MED ORDER — SODIUM CHLORIDE 0.9% FLUSH
10.0000 mL | INTRAVENOUS | Status: DC | PRN
Start: 2023-11-05 — End: 2023-11-05
  Administered 2023-11-05: 10 mL

## 2023-11-05 MED ORDER — SODIUM CHLORIDE 0.9 % IV SOLN
400.0000 mg/m2 | Freq: Once | INTRAVENOUS | Status: AC
  Administered 2023-11-05: 744 mg via INTRAVENOUS
  Filled 2023-11-05: qty 37.2

## 2023-11-05 MED ORDER — OXALIPLATIN CHEMO INJECTION 100 MG/20ML
65.0000 mg/m2 | Freq: Once | INTRAVENOUS | Status: AC
  Administered 2023-11-05: 120 mg via INTRAVENOUS
  Filled 2023-11-05: qty 20

## 2023-11-05 MED ORDER — DEXAMETHASONE SODIUM PHOSPHATE 10 MG/ML IJ SOLN
5.0000 mg | Freq: Once | INTRAMUSCULAR | Status: AC
  Administered 2023-11-05: 5 mg via INTRAVENOUS
  Filled 2023-11-05: qty 1

## 2023-11-05 NOTE — Progress Notes (Signed)
[facility] OFFICE PROGRESS NOTE   Diagnosis: Pancreas cancer  INTERVAL HISTORY:   Mr. Jude complete another cycle FOLFIRINOX on 10/22/2023.  No nausea, mouth sores, or diarrhea.  He reports cold sensitivity for 2 to 3 days following chemotherapy.  No neuropathy symptoms at present.  He feels well.  No abdominal pain.  Hiccups remain improved with Thorazine.  Objective:  Vital signs in last 24 hours:  Blood pressure 129/80, pulse 93, temperature 98.3 F (36.8 C), temperature source Temporal, resp. rate 18, height 5\' 8"  (1.727 m), weight 158 lb 4.8 oz (71.8 kg), SpO2 99%.    HEENT: No thrush or ulcers Resp: Lungs clear bilaterally Cardio: Regular rate and rhythm GI: Nontender, no mass, no hepatosplenomegaly Vascular: No leg edema Neuro: Mild loss of vibratory sense at the right fifth fingertip, vibratory Skin: Palms without erythema  Portacath/PICC-without erythema  Lab Results:  Lab Results  Component Value Date   WBC 5.2 11/05/2023   HGB 12.2 (L) 11/05/2023   HCT 35.1 (L) 11/05/2023   MCV 99.7 11/05/2023   PLT 103 (L) 11/05/2023   NEUTROABS 3.3 11/05/2023    CMP  Lab Results  Component Value Date   NA 134 (L) 10/22/2023   K 4.1 10/22/2023   CL 101 10/22/2023   CO2 26 10/22/2023   GLUCOSE 222 (H) 10/22/2023   BUN 15 10/22/2023   CREATININE 0.78 10/22/2023   CALCIUM 9.6 10/22/2023   PROT 6.4 (L) 10/22/2023   ALBUMIN 4.1 10/22/2023   AST 24 10/22/2023   ALT 32 10/22/2023   ALKPHOS 204 (H) 10/22/2023   BILITOT 0.4 10/22/2023   GFRNONAA >60 10/22/2023   GFRAA >60 03/26/2018    Lab Results  Component Value Date   CEA 772.45 (H) 05/02/2023   FAO130 400 (H) 10/22/2023     Medications: I have reviewed the patient's current medications.   Assessment/Plan: Pancreas cancer CT angiogram chest 04/24/2023-low-density mass in the dome of the liver with at least 3 additional lesions in both hepatic lobes, borderline lymphadenopathy in the  hepatoduodenal ligament Right upper quadrant ultrasound 04/24/2023-large circumscribed mass of the liver dome CT abdomen/pelvis 04/24/2023-multiple liver lesions poorly defined on noncontrast exam, prominent portacaval and porta hepatis nodes MRI abdomen 05/05/2023-multiple rim-hypoenhancing liver lesions consistent with hepatic metastases, suspicion for a pancreas tail mass Ultrasound-guided biopsy of dominant right liver lesion 05/10/2023-adenocarcinoma, CK7 and CDX2 positive, abundant cytoplasm and mucin with extracellular mucin, foci of lymphovascular invasion Tempus gene panel-K-ras G12V, T P53 mild tumor mutation burden 3.2, MSS PET 06/04/2023-multiple hypermetabolic liver lesions consistent with metastases, low-level FDG uptake in the soft tissue fullness at the tip of the pancreas tail, low-level FDG activity involving a small soft tissue nodule between the gastric fundus and spleen, tiny foci of accumulation at the right costovertebral junction at T10 and roof of the left acetabulum without an underlying CT lesion Evaded CEA and CA 19-9 EUS 06/07/2023 ,22x 15 mm irregular mass in the tail the pancreas, 11 mm subcarinal lymph node, FNA biopsy of the pancreas mass-suspicious for malignancy Cycle 1 FOLFIRINOX 06/19/2023 Cycle 2 FOLFIRINOX 07/03/2023 Cycle 3 FOLFIRINOX 07/17/2023 Cycle 4 FOLFIRINOX 07/31/2023 Cycle 5 FOLFIRINOX 08/14/2023, oxaliplatin and Decadron dose reduced Cycle 6 FOLFIRINOX 08/28/2023 Cycle 7 FOLFIRINOX 09/11/2023 CT[facility] at [facility] 09/16/2023, compared to 04/24/2023-unchanged pancreas tail mass, decrease in attenuation of hepatic metastases, several lesions have decreased in size, no new lesions Cycle 8 FOLFIRINOX 09/25/2023 Cycle 9 FOLFIRINOX 10/09/2023 Cycle 10 FOLFIRINOX 10/22/2023 Cycle 11 FOLFIRINOX 11/05/2023 Family history of pancreas  cancerINVITAE panel 05/17/2023-NF1 VUS Severe hiccups following cycle 1 FOLFIRINOX, did not respond to baclofen or gabapentin.  Resolved with Reglan.   Recurrent hiccups following cycle 4 FOLFIRINOX-not relieved with Reglan    Disposition: Mr. Konkel appears stable.  He continues to tolerate the FOLFIRINOX well.  He will complete another cycle today.  He has minimal neuropathy symptoms.  He has mild thrombocytopenia he will call for bleeding or bruising.  He will return for an office visit and chemotherapy in 2 weeks.  Thornton Papas, MD  11/05/2023  8:53 AM

## 2023-11-05 NOTE — Progress Notes (Signed)
Patient seen by Dr. Thornton Papas today  Vitals are within treatment parameters:Yes   Labs are within treatment parameters: Yes   Treatment plan has been signed: Yes   Per physician team, Patient is ready for treatment and there are NO modifications to the treatment plan. Dr. Truett Perna is requesting he receive the same volume IVF he had in past prior to IVF shortage. Patient feels he was weaker and did not do well due to less fluid volume with last treatments.

## 2023-11-05 NOTE — Patient Instructions (Signed)

## 2023-11-05 NOTE — Patient Instructions (Addendum)
[facility] - A DEPT OF . [facility]   The chemotherapy medication bag should finish at 46 hours, 96 hours, or 7 days. For example, if your pump is scheduled for 46 hours and it was put on at 4:00 p.m., it should finish at 2:00 p.m. the day it is scheduled to come off regardless of your appointment time.     Estimated time to finish at 12:30pm Thursday, November 21st, 2024.   If the display on your pump reads "Low Volume" and it is beeping, take the batteries out of the pump and come to the cancer center for it to be taken off.   If the pump alarms go off prior to the pump reading "Low Volume" then call [phone] and someone can assist you.  If the plunger comes out and the chemotherapy medication is leaking out, please use your home chemo spill kit to clean up the spill. Do NOT use paper towels or other household products.  If you have problems or questions regarding your pump, please call either [phone] (24 hours a day) or the cancer center Monday-Friday 8:00 a.m.- 4:30 p.m. at the clinic number and we will assist you. If you are unable to get assistance, then go to the nearest Emergency Department and ask the staff to contact the IV team for assistance.  Discharge Instructions: Thank you for choosing [facility] to provide your oncology and hematology care.   If you have a lab appointment with the Cancer Center, please go directly to the Cancer Center and check in at the registration area.   Wear comfortable clothing and clothing appropriate for easy access to any Portacath or PICC line.   We strive to give you quality time with your provider. You may need to reschedule your appointment if you arrive late (15 or more minutes).  Arriving late affects you and other patients whose appointments are after yours.  Also, if you miss three or more appointments without notifying the office, you may be dismissed from the clinic at the provider's  discretion.      For prescription refill requests, have your pharmacy contact our office and allow 72 hours for refills to be completed.    Today you received the following chemotherapy and/or immunotherapy agents: oxaliplatin, irinotecan, leucovorin, fluorouracil.       To help prevent nausea and vomiting after your treatment, we encourage you to take your nausea medication as directed.  BELOW ARE SYMPTOMS THAT SHOULD BE REPORTED IMMEDIATELY: *FEVER GREATER THAN 100.4 F (38 C) OR HIGHER *CHILLS OR SWEATING *NAUSEA AND VOMITING THAT IS NOT CONTROLLED WITH YOUR NAUSEA MEDICATION *UNUSUAL SHORTNESS OF BREATH *UNUSUAL BRUISING OR BLEEDING *URINARY PROBLEMS (pain or burning when urinating, or frequent urination) *BOWEL PROBLEMS (unusual diarrhea, constipation, pain near the anus) TENDERNESS IN MOUTH AND THROAT WITH OR WITHOUT PRESENCE OF ULCERS (sore throat, sores in mouth, or a toothache) UNUSUAL RASH, SWELLING OR PAIN  UNUSUAL VAGINAL DISCHARGE OR ITCHING   Items with * indicate a potential emergency and should be followed up as soon as possible or go to the Emergency Department if any problems should occur.  Please show the CHEMOTHERAPY ALERT CARD or IMMUNOTHERAPY ALERT CARD at check-in to the Emergency Department and triage nurse.  Should you have questions after your visit or need to cancel or reschedule your appointment, please contact [facility] - [facility] DEPT [facility]  Dept: [phone]  and follow the prompts.  Office hours are 8:00 a.m. to 4:30 p.m. Monday - Friday. Please note that voicemails left after 4:00 p.m. may not be returned until the following business day.  We are closed weekends and major holidays. You have access to a nurse at all times for urgent questions. Please call the main number to the clinic Dept: [phone] and follow the prompts.   For any non-urgent questions, you may also contact your provider using MyChart. We now offer  e-Visits for anyone 53 and older to request care online for non-urgent symptoms. For details visit [email or web address].   Also download the MyChart app! Go to the app store, search "MyChart", open the app, select [place], and log in with your MyChart username and password.  Oxaliplatin Injection What is this medication? OXALIPLATIN (ox AL i PLA tin) treats colorectal cancer. It works by slowing down the growth of cancer cells. This medicine may be used for other purposes; ask your health care provider or pharmacist if you have questions. COMMON BRAND NAME(S): Eloxatin What should I tell my care team before I take this medication? They need to know if you have any of these conditions: Heart disease History of irregular heartbeat or rhythm Liver disease Low blood cell levels (white cells, red cells, and platelets) Lung or breathing disease, such as asthma Take medications that treat or prevent blood clots Tingling of the fingers, toes, or other nerve disorder An unusual or allergic reaction to oxaliplatin, other medications, foods, dyes, or preservatives If you or your partner are pregnant or trying to get pregnant Breast-feeding How should I use this medication? This medication is injected into a vein. It is given by your care team in a hospital or clinic setting. Talk to your care team about the use of this medication in children. Special care may be needed. Overdosage: If you think you have taken too much of this medicine contact a poison control center or emergency room at once. NOTE: This medicine is only for you. Do not share this medicine with others. What if I miss a dose? Keep appointments for follow-up doses. It is important not to miss a dose. Call your care team if you are unable to keep an appointment. What may interact with this medication? Do not take this medication with any of the following: Cisapride Dronedarone Pimozide Thioridazine This medication may also  interact with the following: Aspirin and aspirin-like medications Certain medications that treat or prevent blood clots, such as warfarin, apixaban, dabigatran, and rivaroxaban Cisplatin Cyclosporine Diuretics Medications for infection, such as acyclovir, adefovir, amphotericin B, bacitracin, cidofovir, foscarnet, ganciclovir, gentamicin, pentamidine, vancomycin NSAIDs, medications for pain and inflammation, such as ibuprofen or naproxen Other medications that cause heart rhythm changes Pamidronate Zoledronic acid This list may not describe all possible interactions. Give your health care provider a list of all the medicines, herbs, non-prescription drugs, or dietary supplements you use. Also tell them if you smoke, drink alcohol, or use illegal drugs. Some items may interact with your medicine. What should I watch for while using this medication? Your condition will be monitored carefully while you are receiving this medication. You may need blood work while taking this medication. This medication may make you feel generally unwell. This is not uncommon as chemotherapy can affect healthy cells as well as cancer cells. Report any side effects. Continue your course of treatment even though you feel ill unless your care team tells you to stop. This medication may increase your risk of getting an infection. Call your  care team for advice if you get a fever, chills, sore throat, or other symptoms of a cold or flu. Do not treat yourself. Try to avoid being around people who are sick. Avoid taking medications that contain aspirin, acetaminophen, ibuprofen, naproxen, or ketoprofen unless instructed by your care team. These medications may hide a fever. Be careful brushing or flossing your teeth or using a toothpick because you may get an infection or bleed more easily. If you have any dental work done, tell your dentist you are receiving this medication. This medication can make you more sensitive to  cold. Do not drink cold drinks or use ice. Cover exposed skin before coming in contact with cold temperatures or cold objects. When out in cold weather wear warm clothing and cover your mouth and nose to warm the air that goes into your lungs. Tell your care team if you get sensitive to the cold. Talk to your care team if you or your partner are pregnant or think either of you might be pregnant. This medication can cause serious birth defects if taken during pregnancy and for 9 months after the last dose. A negative pregnancy test is required before starting this medication. A reliable form of contraception is recommended while taking this medication and for 9 months after the last dose. Talk to your care team about effective forms of contraception. Do not father a child while taking this medication and for 6 months after the last dose. Use a condom while having sex during this time period. Do not breastfeed while taking this medication and for 3 months after the last dose. This medication may cause infertility. Talk to your care team if you are concerned about your fertility. What side effects may I notice from receiving this medication? Side effects that you should report to your care team as soon as possible: Allergic reactions--skin rash, itching, hives, swelling of the face, lips, tongue, or throat Bleeding--bloody or black, tar-like stools, vomiting blood or brown material that looks like coffee grounds, red or dark brown urine, small red or purple spots on skin, unusual bruising or bleeding Dry cough, shortness of breath or trouble breathing Heart rhythm changes--fast or irregular heartbeat, dizziness, feeling faint or lightheaded, chest pain, trouble breathing Infection--fever, chills, cough, sore throat, wounds that don't heal, pain or trouble when passing urine, general feeling of discomfort or being unwell Liver injury--right upper belly pain, loss of appetite, nausea, light-colored stool, dark  yellow or brown urine, yellowing skin or eyes, unusual weakness or fatigue Low red blood cell level--unusual weakness or fatigue, dizziness, headache, trouble breathing Muscle injury--unusual weakness or fatigue, muscle pain, dark yellow or brown urine, decrease in amount of urine Pain, tingling, or numbness in the hands or feet Sudden and severe headache, confusion, change in vision, seizures, which may be signs of posterior reversible encephalopathy syndrome (PRES) Unusual bruising or bleeding Side effects that usually do not require medical attention (report to your care team if they continue or are bothersome): Diarrhea Nausea Pain, redness, or swelling with sores inside the mouth or throat Unusual weakness or fatigue Vomiting This list may not describe all possible side effects. Call your doctor for medical advice about side effects. You may report side effects to FDA at 1-800-FDA-1088. Where should I keep my medication? This medication is given in a hospital or clinic. It will not be stored at home. NOTE: This sheet is a summary. It may not cover all possible information. If you have questions about this medicine,  talk to your doctor, pharmacist, or health care provider.  2024 Elsevier/Gold Standard (2023-02-10 00:00:00) Irinotecan Injection What is this medication? IRINOTECAN (ir in oh TEE kan) treats some types of cancer. It works by slowing down the growth of cancer cells. This medicine may be used for other purposes; ask your health care provider or pharmacist if you have questions. COMMON BRAND NAME(S): Camptosar What should I tell my care team before I take this medication? They need to know if you have any of these conditions: Dehydration Diarrhea Infection, especially a viral infection, such as chickenpox, cold sores, herpes Liver disease Low blood cell levels (white cells, red cells, and platelets) Low levels of electrolytes, such as calcium, magnesium, or potassium in  your blood Recent or ongoing radiation An unusual or allergic reaction to irinotecan, other medications, foods, dyes, or preservatives If you or your partner are pregnant or trying to get pregnant Breast-feeding How should I use this medication? This medication is injected into a vein. It is given by your care team in a hospital or clinic setting. Talk to your care team about the use of this medication in children. Special care may be needed. Overdosage: If you think you have taken too much of this medicine contact a poison control center or emergency room at once. NOTE: This medicine is only for you. Do not share this medicine with others. What if I miss a dose? Keep appointments for follow-up doses. It is important not to miss your dose. Call your care team if you are unable to keep an appointment. What may interact with this medication? Do not take this medication with any of the following: Cobicistat Itraconazole This medication may also interact with the following: Certain antibiotics, such as clarithromycin, rifampin, rifabutin Certain antivirals for HIV or AIDS Certain medications for fungal infections, such as ketoconazole, posaconazole, voriconazole Certain medications for seizures, such as carbamazepine, phenobarbital, phenytoin Gemfibrozil Nefazodone St. John's wort This list may not describe all possible interactions. Give your health care provider a list of all the medicines, herbs, non-prescription drugs, or dietary supplements you use. Also tell them if you smoke, drink alcohol, or use illegal drugs. Some items may interact with your medicine. What should I watch for while using this medication? Your condition will be monitored carefully while you are receiving this medication. You may need blood work while taking this medication. This medication may make you feel generally unwell. This is not uncommon as chemotherapy can affect healthy cells as well as cancer cells. Report  any side effects. Continue your course of treatment even though you feel ill unless your care team tells you to stop. This medication can cause serious side effects. To reduce the risk, your care team may give you other medications to take before receiving this one. Be sure to follow the directions from your care team. This medication may affect your coordination, reaction time, or judgement. Do not drive or operate machinery until you know how this medication affects you. Sit up or stand slowly to reduce the risk of dizzy or fainting spells. Drinking alcohol with this medication can increase the risk of these side effects. This medication may increase your risk of getting an infection. Call your care team for advice if you get a fever, chills, sore throat, or other symptoms of a cold or flu. Do not treat yourself. Try to avoid being around people who are sick. Avoid taking medications that contain aspirin, acetaminophen, ibuprofen, naproxen, or ketoprofen unless instructed by your care  team. These medications may hide a fever. This medication may increase your risk to bruise or bleed. Call your care team if you notice any unusual bleeding. Be careful brushing or flossing your teeth or using a toothpick because you may get an infection or bleed more easily. If you have any dental work done, tell your dentist you are receiving this medication. Talk to your care team if you or your partner are pregnant or think either of you might be pregnant. This medication can cause serious birth defects if taken during pregnancy and for 6 months after the last dose. You will need a negative pregnancy test before starting this medication. Contraception is recommended while taking this medication and for 6 months after the last dose. Your care team can help you find the option that works for you. Do not father a child while taking this medication and for 3 months after the last dose. Use a condom for contraception during  this time period. Do not breastfeed while taking this medication and for 7 days after the last dose. This medication may cause infertility. Talk to your care team if you are concerned about your fertility. What side effects may I notice from receiving this medication? Side effects that you should report to your care team as soon as possible: Allergic reactions--skin rash, itching, hives, swelling of the face, lips, tongue, or throat Dry cough, shortness of breath or trouble breathing Increased saliva or tears, increased sweating, stomach cramping, diarrhea, small pupils, unusual weakness or fatigue, slow heartbeat Infection--fever, chills, cough, sore throat, wounds that don't heal, pain or trouble when passing urine, general feeling of discomfort or being unwell Kidney injury--decrease in the amount of urine, swelling of the ankles, hands, or feet Low red blood cell level--unusual weakness or fatigue, dizziness, headache, trouble breathing Severe or prolonged diarrhea Unusual bruising or bleeding Side effects that usually do not require medical attention (report to your care team if they continue or are bothersome): Constipation Diarrhea Hair loss Loss of appetite Nausea Stomach pain This list may not describe all possible side effects. Call your doctor for medical advice about side effects. You may report side effects to FDA at 1-800-FDA-1088. Where should I keep my medication? This medication is given in a hospital or clinic. It will not be stored at home. NOTE: This sheet is a summary. It may not cover all possible information. If you have questions about this medicine, talk to your doctor, pharmacist, or health care provider.  2024 Elsevier/Gold Standard (2022-04-16 00:00:00) Leucovorin Injection What is this medication? LEUCOVORIN (loo koe VOR in) prevents side effects from certain medications, such as methotrexate. It works by increasing folate levels. This helps protect healthy  cells in your body. It may also be used to treat anemia caused by low levels of folate. It can also be used with fluorouracil, a type of chemotherapy, to treat colorectal cancer. It works by increasing the effects of fluorouracil in the body. This medicine may be used for other purposes; ask your health care provider or pharmacist if you have questions. What should I tell my care team before I take this medication? They need to know if you have any of these conditions: Anemia from low levels of vitamin B12 in the blood An unusual or allergic reaction to leucovorin, folic acid, other medications, foods, dyes, or preservatives Pregnant or trying to get pregnant Breastfeeding How should I use this medication? This medication is injected into a vein or a muscle. It is given  by your care team in a hospital or clinic setting. Talk to your care team about the use of this medication in children. Special care may be needed. Overdosage: If you think you have taken too much of this medicine contact a poison control center or emergency room at once. NOTE: This medicine is only for you. Do not share this medicine with others. What if I miss a dose? Keep appointments for follow-up doses. It is important not to miss your dose. Call your care team if you are unable to keep an appointment. What may interact with this medication? Capecitabine Fluorouracil Phenobarbital Phenytoin Primidone Trimethoprim;sulfamethoxazole This list may not describe all possible interactions. Give your health care provider a list of all the medicines, herbs, non-prescription drugs, or dietary supplements you use. Also tell them if you smoke, drink alcohol, or use illegal drugs. Some items may interact with your medicine. What should I watch for while using this medication? Your condition will be monitored carefully while you are receiving this medication. This medication may increase the side effects of 5-fluorouracil. Tell your  care team if you have diarrhea or mouth sores that do not get better or that get worse. What side effects may I notice from receiving this medication? Side effects that you should report to your care team as soon as possible: Allergic reactions--skin rash, itching, hives, swelling of the face, lips, tongue, or throat This list may not describe all possible side effects. Call your doctor for medical advice about side effects. You may report side effects to FDA at 1-800-FDA-1088. Where should I keep my medication? This medication is given in a hospital or clinic. It will not be stored at home. NOTE: This sheet is a summary. It may not cover all possible information. If you have questions about this medicine, talk to your doctor, pharmacist, or health care provider.  2024 Elsevier/Gold Standard (2022-05-08 00:00:00) Fluorouracil Injection What is this medication? FLUOROURACIL (flure oh YOOR a sil) treats some types of cancer. It works by slowing down the growth of cancer cells. This medicine may be used for other purposes; ask your health care provider or pharmacist if you have questions. COMMON BRAND NAME(S): Adrucil What should I tell my care team before I take this medication? They need to know if you have any of these conditions: Blood disorders Dihydropyrimidine dehydrogenase (DPD) deficiency Infection, such as chickenpox, cold sores, herpes Kidney disease Liver disease Poor nutrition Recent or ongoing radiation therapy An unusual or allergic reaction to fluorouracil, other medications, foods, dyes, or preservatives If you or your partner are pregnant or trying to get pregnant Breast-feeding How should I use this medication? This medication is injected into a vein. It is administered by your care team in a hospital or clinic setting. Talk to your care team about the use of this medication in children. Special care may be needed. Overdosage: If you think you have taken too much of this  medicine contact a poison control center or emergency room at once. NOTE: This medicine is only for you. Do not share this medicine with others. What if I miss a dose? Keep appointments for follow-up doses. It is important not to miss your dose. Call your care team if you are unable to keep an appointment. What may interact with this medication? Do not take this medication with any of the following: Live virus vaccines This medication may also interact with the following: Medications that treat or prevent blood clots, such as warfarin, enoxaparin, dalteparin This  list may not describe all possible interactions. Give your health care provider a list of all the medicines, herbs, non-prescription drugs, or dietary supplements you use. Also tell them if you smoke, drink alcohol, or use illegal drugs. Some items may interact with your medicine. What should I watch for while using this medication? Your condition will be monitored carefully while you are receiving this medication. This medication may make you feel generally unwell. This is not uncommon as chemotherapy can affect healthy cells as well as cancer cells. Report any side effects. Continue your course of treatment even though you feel ill unless your care team tells you to stop. In some cases, you may be given additional medications to help with side effects. Follow all directions for their use. This medication may increase your risk of getting an infection. Call your care team for advice if you get a fever, chills, sore throat, or other symptoms of a cold or flu. Do not treat yourself. Try to avoid being around people who are sick. This medication may increase your risk to bruise or bleed. Call your care team if you notice any unusual bleeding. Be careful brushing or flossing your teeth or using a toothpick because you may get an infection or bleed more easily. If you have any dental work done, tell your dentist you are receiving this  medication. Avoid taking medications that contain aspirin, acetaminophen, ibuprofen, naproxen, or ketoprofen unless instructed by your care team. These medications may hide a fever. Do not treat diarrhea with over the counter products. Contact your care team if you have diarrhea that lasts more than 2 days or if it is severe and watery. This medication can make you more sensitive to the sun. Keep out of the sun. If you cannot avoid being in the sun, wear protective clothing and sunscreen. Do not use sun lamps, tanning beds, or tanning booths. Talk to your care team if you or your partner wish to become pregnant or think you might be pregnant. This medication can cause serious birth defects if taken during pregnancy and for 3 months after the last dose. A reliable form of contraception is recommended while taking this medication and for 3 months after the last dose. Talk to your care team about effective forms of contraception. Do not father a child while taking this medication and for 3 months after the last dose. Use a condom while having sex during this time period. Do not breastfeed while taking this medication. This medication may cause infertility. Talk to your care team if you are concerned about your fertility. What side effects may I notice from receiving this medication? Side effects that you should report to your care team as soon as possible: Allergic reactions--skin rash, itching, hives, swelling of the face, lips, tongue, or throat Heart attack--pain or tightness in the chest, shoulders, arms, or jaw, nausea, shortness of breath, cold or clammy skin, feeling faint or lightheaded Heart failure--shortness of breath, swelling of the ankles, feet, or hands, sudden weight gain, unusual weakness or fatigue Heart rhythm changes--fast or irregular heartbeat, dizziness, feeling faint or lightheaded, chest pain, trouble breathing High ammonia level--unusual weakness or fatigue, confusion, loss of  appetite, nausea, vomiting, seizures Infection--fever, chills, cough, sore throat, wounds that don't heal, pain or trouble when passing urine, general feeling of discomfort or being unwell Low red blood cell level--unusual weakness or fatigue, dizziness, headache, trouble breathing Pain, tingling, or numbness in the hands or feet, muscle weakness, change in vision, confusion or  trouble speaking, loss of balance or coordination, trouble walking, seizures Redness, swelling, and blistering of the skin over hands and feet Severe or prolonged diarrhea Unusual bruising or bleeding Side effects that usually do not require medical attention (report to your care team if they continue or are bothersome): Dry skin Headache Increased tears Nausea Pain, redness, or swelling with sores inside the mouth or throat Sensitivity to light Vomiting This list may not describe all possible side effects. Call your doctor for medical advice about side effects. You may report side effects to FDA at 1-800-FDA-1088. Where should I keep my medication? This medication is given in a hospital or clinic. It will not be stored at home. NOTE: This sheet is a summary. It may not cover all possible information. If you have questions about this medicine, talk to your doctor, pharmacist, or health care provider.  2024 [employer] (2022-04-10 00:00:00)

## 2023-11-06 LAB — CANCER ANTIGEN 19-9: CA 19-9: 325 U/mL — ABNORMAL HIGH (ref 0–35)

## 2023-11-07 ENCOUNTER — Inpatient Hospital Stay: Payer: No Typology Code available for payment source

## 2023-11-07 VITALS — BP 143/84 | HR 88 | Temp 98.0°F | Resp 18

## 2023-11-07 DIAGNOSIS — Z5111 Encounter for antineoplastic chemotherapy: Secondary | ICD-10-CM | POA: Diagnosis not present

## 2023-11-07 DIAGNOSIS — C259 Malignant neoplasm of pancreas, unspecified: Secondary | ICD-10-CM

## 2023-11-07 MED ORDER — HEPARIN SOD (PORK) LOCK FLUSH 100 UNIT/ML IV SOLN
500.0000 [IU] | Freq: Once | INTRAVENOUS | Status: AC | PRN
  Administered 2023-11-07: 500 [IU]

## 2023-11-07 MED ORDER — PEGFILGRASTIM-JMDB 6 MG/0.6ML ~~LOC~~ SOSY
6.0000 mg | PREFILLED_SYRINGE | Freq: Once | SUBCUTANEOUS | Status: AC
  Administered 2023-11-07: 6 mg via SUBCUTANEOUS
  Filled 2023-11-07: qty 0.6

## 2023-11-07 MED ORDER — SODIUM CHLORIDE 0.9% FLUSH
10.0000 mL | INTRAVENOUS | Status: DC | PRN
  Administered 2023-11-07: 10 mL

## 2023-11-07 NOTE — Patient Instructions (Signed)

## 2023-11-15 ENCOUNTER — Other Ambulatory Visit: Payer: Self-pay | Admitting: Oncology

## 2023-11-20 ENCOUNTER — Inpatient Hospital Stay: Payer: No Typology Code available for payment source

## 2023-11-20 ENCOUNTER — Inpatient Hospital Stay: Payer: No Typology Code available for payment source | Attending: Hematology

## 2023-11-20 ENCOUNTER — Encounter: Payer: Self-pay | Admitting: Nurse Practitioner

## 2023-11-20 ENCOUNTER — Inpatient Hospital Stay (HOSPITAL_BASED_OUTPATIENT_CLINIC_OR_DEPARTMENT_OTHER): Payer: No Typology Code available for payment source | Admitting: Nurse Practitioner

## 2023-11-20 VITALS — BP 136/72 | HR 70 | Resp 18

## 2023-11-20 VITALS — BP 137/80 | HR 79 | Temp 97.9°F | Ht 68.0 in | Wt 158.8 lb

## 2023-11-20 DIAGNOSIS — C252 Malignant neoplasm of tail of pancreas: Secondary | ICD-10-CM | POA: Diagnosis present

## 2023-11-20 DIAGNOSIS — C259 Malignant neoplasm of pancreas, unspecified: Secondary | ICD-10-CM

## 2023-11-20 DIAGNOSIS — C787 Secondary malignant neoplasm of liver and intrahepatic bile duct: Secondary | ICD-10-CM

## 2023-11-20 DIAGNOSIS — Z800 Family history of malignant neoplasm of digestive organs: Secondary | ICD-10-CM | POA: Insufficient documentation

## 2023-11-20 DIAGNOSIS — Z5189 Encounter for other specified aftercare: Secondary | ICD-10-CM | POA: Insufficient documentation

## 2023-11-20 DIAGNOSIS — Z5111 Encounter for antineoplastic chemotherapy: Secondary | ICD-10-CM | POA: Diagnosis present

## 2023-11-20 LAB — CBC WITH DIFFERENTIAL (CANCER CENTER ONLY)
Abs Immature Granulocytes: 0.04 10*3/uL (ref 0.00–0.07)
Basophils Absolute: 0 10*3/uL (ref 0.0–0.1)
Basophils Relative: 0 %
Eosinophils Absolute: 0.1 10*3/uL (ref 0.0–0.5)
Eosinophils Relative: 2 %
HCT: 34.9 % — ABNORMAL LOW (ref 39.0–52.0)
Hemoglobin: 11.9 g/dL — ABNORMAL LOW (ref 13.0–17.0)
Immature Granulocytes: 1 %
Lymphocytes Relative: 22 %
Lymphs Abs: 1.1 10*3/uL (ref 0.7–4.0)
MCH: 34.9 pg — ABNORMAL HIGH (ref 26.0–34.0)
MCHC: 34.1 g/dL (ref 30.0–36.0)
MCV: 102.3 fL — ABNORMAL HIGH (ref 80.0–100.0)
Monocytes Absolute: 0.7 10*3/uL (ref 0.1–1.0)
Monocytes Relative: 14 %
Neutro Abs: 3 10*3/uL (ref 1.7–7.7)
Neutrophils Relative %: 61 %
Platelet Count: 107 10*3/uL — ABNORMAL LOW (ref 150–400)
RBC: 3.41 MIL/uL — ABNORMAL LOW (ref 4.22–5.81)
RDW: 14.2 % (ref 11.5–15.5)
WBC Count: 4.9 10*3/uL (ref 4.0–10.5)
nRBC: 0 % (ref 0.0–0.2)

## 2023-11-20 LAB — CMP (CANCER CENTER ONLY)
ALT: 29 U/L (ref 0–44)
AST: 27 U/L (ref 15–41)
Albumin: 3.9 g/dL (ref 3.5–5.0)
Alkaline Phosphatase: 197 U/L — ABNORMAL HIGH (ref 38–126)
Anion gap: 10 (ref 5–15)
BUN: 19 mg/dL (ref 6–20)
CO2: 24 mmol/L (ref 22–32)
Calcium: 9.2 mg/dL (ref 8.9–10.3)
Chloride: 103 mmol/L (ref 98–111)
Creatinine: 0.91 mg/dL (ref 0.61–1.24)
GFR, Estimated: >60 mL/min (ref >60)
Glucose, Bld: 216 mg/dL — ABNORMAL HIGH (ref 70–99)
Potassium: 4.1 mmol/L (ref 3.5–5.1)
Sodium: 137 mmol/L (ref 135–145)
Total Bilirubin: 0.5 mg/dL (ref <1.2)
Total Protein: 6.9 g/dL (ref 6.5–8.1)

## 2023-11-20 MED ORDER — SODIUM CHLORIDE 0.9 % IV SOLN
150.0000 mg/m2 | Freq: Once | INTRAVENOUS | Status: AC
  Administered 2023-11-20: 300 mg via INTRAVENOUS
  Filled 2023-11-20: qty 15

## 2023-11-20 MED ORDER — SODIUM CHLORIDE 0.9 % IV SOLN
150.0000 mg | Freq: Once | INTRAVENOUS | Status: AC
  Administered 2023-11-20: 150 mg via INTRAVENOUS
  Filled 2023-11-20: qty 150

## 2023-11-20 MED ORDER — SODIUM CHLORIDE 0.9 % IV SOLN
2400.0000 mg/m2 | INTRAVENOUS | Status: DC
  Administered 2023-11-20: 4450 mg via INTRAVENOUS
  Filled 2023-11-20: qty 89

## 2023-11-20 MED ORDER — SODIUM CHLORIDE 0.9 % IV SOLN
400.0000 mg/m2 | Freq: Once | INTRAVENOUS | Status: AC
  Administered 2023-11-20: 744 mg via INTRAVENOUS
  Filled 2023-11-20: qty 37.2

## 2023-11-20 MED ORDER — DEXTROSE 5 % IV SOLN: Freq: Once | INTRAVENOUS | Status: AC

## 2023-11-20 MED ORDER — OXALIPLATIN CHEMO INJECTION 100 MG/20ML
65.0000 mg/m2 | Freq: Once | INTRAVENOUS | Status: AC
  Administered 2023-11-20: 120 mg via INTRAVENOUS
  Filled 2023-11-20: qty 20

## 2023-11-20 MED ORDER — PALONOSETRON HCL INJECTION 0.25 MG/5ML
0.2500 mg | Freq: Once | INTRAVENOUS | Status: AC
  Administered 2023-11-20: 0.25 mg via INTRAVENOUS
  Filled 2023-11-20: qty 5

## 2023-11-20 MED ORDER — DEXAMETHASONE SODIUM PHOSPHATE 10 MG/ML IJ SOLN
5.0000 mg | Freq: Once | INTRAMUSCULAR | Status: AC
  Administered 2023-11-20: 5 mg via INTRAVENOUS
  Filled 2023-11-20: qty 1

## 2023-11-20 MED ORDER — SODIUM CHLORIDE 0.9% FLUSH
10.0000 mL | INTRAVENOUS | Status: DC | PRN
  Administered 2023-11-20: 10 mL

## 2023-11-20 MED ORDER — ATROPINE SULFATE 1 MG/ML IV SOLN
0.5000 mg | Freq: Once | INTRAVENOUS | Status: AC | PRN
  Administered 2023-11-20: 0.5 mg via INTRAVENOUS
  Filled 2023-11-20: qty 1

## 2023-11-20 NOTE — Patient Instructions (Addendum)
[facility]  Discharge Instructions: The chemotherapy medication bag should finish at 46 hours, 96 hours, or 7 days. For example, if your pump is scheduled for 46 hours and it was put on at 4:00 p.m., it should finish at 2:00 p.m. the day it is scheduled to come off regardless of your appointment time.     Estimated time to finish at 11/22/23, 1345   If the display on your pump reads "Low Volume" and it is beeping, take the batteries out of the pump and come to the cancer center for it to be taken off.   If the pump alarms go off prior to the pump reading "Low Volume" then call [phone] and someone can assist you.  If the plunger comes out and the chemotherapy medication is leaking out, please use your home chemo spill kit to clean up the spill. Do NOT use paper towels or other household products.  If you have problems or questions regarding your pump, please call either [phone] (24 hours a day) or the cancer center Monday-Friday 8:00 a.m.- 4:30 p.m. at the clinic number and we will assist you. If you are unable to get assistance, then go to the nearest Emergency Department and ask the staff to contact the IV team for assistance.   Thank you for choosing  Cancer Center to provide your oncology and hematology care.   If you have a lab appointment with the Cancer Center, please go directly to the Cancer Center and check in at the registration area.   Wear comfortable clothing and clothing appropriate for easy access to any Portacath or PICC line.   We strive to give you quality time with your provider. You may need to reschedule your appointment if you arrive late (15 or more minutes).  Arriving late affects you and other patients whose appointments are after yours.  Also, if you miss three or more appointments without notifying the office, you may be dismissed from the clinic at the provider's discretion.      For  prescription refill requests, have your pharmacy contact our office and allow 72 hours for refills to be completed.    Today you received the following chemotherapy and/or immunotherapy agents oxaliplatin, irinotecan, leucovorin, fluorouracil     To help prevent nausea and vomiting after your treatment, we encourage you to take your nausea medication as directed.  BELOW ARE SYMPTOMS THAT SHOULD BE REPORTED IMMEDIATELY: *FEVER GREATER THAN 100.4 F (38 C) OR HIGHER *CHILLS OR SWEATING *NAUSEA AND VOMITING THAT IS NOT CONTROLLED WITH YOUR NAUSEA MEDICATION *UNUSUAL SHORTNESS OF BREATH *UNUSUAL BRUISING OR BLEEDING *URINARY PROBLEMS (pain or burning when urinating, or frequent urination) *BOWEL PROBLEMS (unusual diarrhea, constipation, pain near the anus) TENDERNESS IN MOUTH AND THROAT WITH OR WITHOUT PRESENCE OF ULCERS (sore throat, sores in mouth, or a toothache) UNUSUAL RASH, SWELLING OR PAIN  UNUSUAL VAGINAL DISCHARGE OR ITCHING   Items with * indicate a potential emergency and should be followed up as soon as possible or go to the Emergency Department if any problems should occur.  Please show the CHEMOTHERAPY ALERT CARD or IMMUNOTHERAPY ALERT CARD at check-in to the Emergency Department and triage nurse.  Should you have questions after your visit or need to cancel or reschedule your appointment, please contact [facility]  Dept: [phone]  and follow the prompts.  Office hours are 8:00  a.m. to 4:30 p.m. Monday - Friday. Please note that voicemails left after 4:00 p.m. may not be returned until the following business day.  We are closed weekends and major holidays. You have access to a nurse at all times for urgent questions. Please call the main number to the clinic Dept: [phone] and follow the prompts.   For any non-urgent questions, you may also contact your provider using MyChart. We now offer e-Visits for anyone 41 and  older to request care online for non-urgent symptoms. For details visit [email or web address].   Also download the MyChart app! Go to the app store, search "MyChart", open the app, select [place], and log in with your MyChart username and password.  Oxaliplatin Injection What is this medication? OXALIPLATIN (ox AL i PLA tin) treats colorectal cancer. It works by slowing down the growth of cancer cells. This medicine may be used for other purposes; ask your health care provider or pharmacist if you have questions. COMMON BRAND NAME(S): Eloxatin What should I tell my care team before I take this medication? They need to know if you have any of these conditions: Heart disease History of irregular heartbeat or rhythm Liver disease Low blood cell levels (white cells, red cells, and platelets) Lung or breathing disease, such as asthma Take medications that treat or prevent blood clots Tingling of the fingers, toes, or other nerve disorder An unusual or allergic reaction to oxaliplatin, other medications, foods, dyes, or preservatives If you or your partner are pregnant or trying to get pregnant Breast-feeding How should I use this medication? This medication is injected into a vein. It is given by your care team in a hospital or clinic setting. Talk to your care team about the use of this medication in children. Special care may be needed. Overdosage: If you think you have taken too much of this medicine contact a poison control center or emergency room at once. NOTE: This medicine is only for you. Do not share this medicine with others. What if I miss a dose? Keep appointments for follow-up doses. It is important not to miss a dose. Call your care team if you are unable to keep an appointment. What may interact with this medication? Do not take this medication with any of the following: Cisapride Dronedarone Pimozide Thioridazine This medication may also interact with the  following: Aspirin and aspirin-like medications Certain medications that treat or prevent blood clots, such as warfarin, apixaban, dabigatran, and rivaroxaban Cisplatin Cyclosporine Diuretics Medications for infection, such as acyclovir, adefovir, amphotericin B, bacitracin, cidofovir, foscarnet, ganciclovir, gentamicin, pentamidine, vancomycin NSAIDs, medications for pain and inflammation, such as ibuprofen or naproxen Other medications that cause heart rhythm changes Pamidronate Zoledronic acid This list may not describe all possible interactions. Give your health care provider a list of all the medicines, herbs, non-prescription drugs, or dietary supplements you use. Also tell them if you smoke, drink alcohol, or use illegal drugs. Some items may interact with your medicine. What should I watch for while using this medication? Your condition will be monitored carefully while you are receiving this medication. You may need blood work while taking this medication. This medication may make you feel generally unwell. This is not uncommon as chemotherapy can affect healthy cells as well as cancer cells. Report any side effects. Continue your course of treatment even though you feel ill unless your care team tells you to stop. This medication may increase your risk of getting an infection. Call your care team for advice  if you get a fever, chills, sore throat, or other symptoms of a cold or flu. Do not treat yourself. Try to avoid being around people who are sick. Avoid taking medications that contain aspirin, acetaminophen, ibuprofen, naproxen, or ketoprofen unless instructed by your care team. These medications may hide a fever. Be careful brushing or flossing your teeth or using a toothpick because you may get an infection or bleed more easily. If you have any dental work done, tell your dentist you are receiving this medication. This medication can make you more sensitive to cold. Do not drink  cold drinks or use ice. Cover exposed skin before coming in contact with cold temperatures or cold objects. When out in cold weather wear warm clothing and cover your mouth and nose to warm the air that goes into your lungs. Tell your care team if you get sensitive to the cold. Talk to your care team if you or your partner are pregnant or think either of you might be pregnant. This medication can cause serious birth defects if taken during pregnancy and for 9 months after the last dose. A negative pregnancy test is required before starting this medication. A reliable form of contraception is recommended while taking this medication and for 9 months after the last dose. Talk to your care team about effective forms of contraception. Do not father a child while taking this medication and for 6 months after the last dose. Use a condom while having sex during this time period. Do not breastfeed while taking this medication and for 3 months after the last dose. This medication may cause infertility. Talk to your care team if you are concerned about your fertility. What side effects may I notice from receiving this medication? Side effects that you should report to your care team as soon as possible: Allergic reactions--skin rash, itching, hives, swelling of the face, lips, tongue, or throat Bleeding--bloody or black, tar-like stools, vomiting blood or brown material that looks like coffee grounds, red or dark brown urine, small red or purple spots on skin, unusual bruising or bleeding Dry cough, shortness of breath or trouble breathing Heart rhythm changes--fast or irregular heartbeat, dizziness, feeling faint or lightheaded, chest pain, trouble breathing Infection--fever, chills, cough, sore throat, wounds that don't heal, pain or trouble when passing urine, general feeling of discomfort or being unwell Liver injury--right upper belly pain, loss of appetite, nausea, light-colored stool, dark yellow or brown  urine, yellowing skin or eyes, unusual weakness or fatigue Low red blood cell level--unusual weakness or fatigue, dizziness, headache, trouble breathing Muscle injury--unusual weakness or fatigue, muscle pain, dark yellow or brown urine, decrease in amount of urine Pain, tingling, or numbness in the hands or feet Sudden and severe headache, confusion, change in vision, seizures, which may be signs of posterior reversible encephalopathy syndrome (PRES) Unusual bruising or bleeding Side effects that usually do not require medical attention (report to your care team if they continue or are bothersome): Diarrhea Nausea Pain, redness, or swelling with sores inside the mouth or throat Unusual weakness or fatigue Vomiting This list may not describe all possible side effects. Call your doctor for medical advice about side effects. You may report side effects to FDA at 1-800-FDA-1088. Where should I keep my medication? This medication is given in a hospital or clinic. It will not be stored at home. NOTE: This sheet is a summary. It may not cover all possible information. If you have questions about this medicine, talk to your doctor,  pharmacist, or health care provider.  2024 Elsevier/Gold Standard (2022-09-18 00:00:00) Irinotecan Injection What is this medication? IRINOTECAN (ir in oh TEE kan) treats some types of cancer. It works by slowing down the growth of cancer cells. This medicine may be used for other purposes; ask your health care provider or pharmacist if you have questions. COMMON BRAND NAME(S): Camptosar What should I tell my care team before I take this medication? They need to know if you have any of these conditions: Dehydration Diarrhea Infection, especially a viral infection, such as chickenpox, cold sores, herpes Liver disease Low blood cell levels (white cells, red cells, and platelets) Low levels of electrolytes, such as calcium, magnesium, or potassium in your blood Recent  or ongoing radiation An unusual or allergic reaction to irinotecan, other medications, foods, dyes, or preservatives If you or your partner are pregnant or trying to get pregnant Breast-feeding How should I use this medication? This medication is injected into a vein. It is given by your care team in a hospital or clinic setting. Talk to your care team about the use of this medication in children. Special care may be needed. Overdosage: If you think you have taken too much of this medicine contact a poison control center or emergency room at once. NOTE: This medicine is only for you. Do not share this medicine with others. What if I miss a dose? Keep appointments for follow-up doses. It is important not to miss your dose. Call your care team if you are unable to keep an appointment. What may interact with this medication? Do not take this medication with any of the following: Cobicistat Itraconazole This medication may also interact with the following: Certain antibiotics, such as clarithromycin, rifampin, rifabutin Certain antivirals for HIV or AIDS Certain medications for fungal infections, such as ketoconazole, posaconazole, voriconazole Certain medications for seizures, such as carbamazepine, phenobarbital, phenytoin Gemfibrozil Nefazodone St. John's wort This list may not describe all possible interactions. Give your health care provider a list of all the medicines, herbs, non-prescription drugs, or dietary supplements you use. Also tell them if you smoke, drink alcohol, or use illegal drugs. Some items may interact with your medicine. What should I watch for while using this medication? Your condition will be monitored carefully while you are receiving this medication. You may need blood work while taking this medication. This medication may make you feel generally unwell. This is not uncommon as chemotherapy can affect healthy cells as well as cancer cells. Report any side effects.  Continue your course of treatment even though you feel ill unless your care team tells you to stop. This medication can cause serious side effects. To reduce the risk, your care team may give you other medications to take before receiving this one. Be sure to follow the directions from your care team. This medication may affect your coordination, reaction time, or judgement. Do not drive or operate machinery until you know how this medication affects you. Sit up or stand slowly to reduce the risk of dizzy or fainting spells. Drinking alcohol with this medication can increase the risk of these side effects. This medication may increase your risk of getting an infection. Call your care team for advice if you get a fever, chills, sore throat, or other symptoms of a cold or flu. Do not treat yourself. Try to avoid being around people who are sick. Avoid taking medications that contain aspirin, acetaminophen, ibuprofen, naproxen, or ketoprofen unless instructed by your care team. These medications may  hide a fever. This medication may increase your risk to bruise or bleed. Call your care team if you notice any unusual bleeding. Be careful brushing or flossing your teeth or using a toothpick because you may get an infection or bleed more easily. If you have any dental work done, tell your dentist you are receiving this medication. Talk to your care team if you or your partner are pregnant or think either of you might be pregnant. This medication can cause serious birth defects if taken during pregnancy and for 6 months after the last dose. You will need a negative pregnancy test before starting this medication. Contraception is recommended while taking this medication and for 6 months after the last dose. Your care team can help you find the option that works for you. Do not father a child while taking this medication and for 3 months after the last dose. Use a condom for contraception during this time period. Do  not breastfeed while taking this medication and for 7 days after the last dose. This medication may cause infertility. Talk to your care team if you are concerned about your fertility. What side effects may I notice from receiving this medication? Side effects that you should report to your care team as soon as possible: Allergic reactions--skin rash, itching, hives, swelling of the face, lips, tongue, or throat Dry cough, shortness of breath or trouble breathing Increased saliva or tears, increased sweating, stomach cramping, diarrhea, small pupils, unusual weakness or fatigue, slow heartbeat Infection--fever, chills, cough, sore throat, wounds that don't heal, pain or trouble when passing urine, general feeling of discomfort or being unwell Kidney injury--decrease in the amount of urine, swelling of the ankles, hands, or feet Low red blood cell level--unusual weakness or fatigue, dizziness, headache, trouble breathing Severe or prolonged diarrhea Unusual bruising or bleeding Side effects that usually do not require medical attention (report to your care team if they continue or are bothersome): Constipation Diarrhea Hair loss Loss of appetite Nausea Stomach pain This list may not describe all possible side effects. Call your doctor for medical advice about side effects. You may report side effects to FDA at 1-800-FDA-1088. Where should I keep my medication? This medication is given in a hospital or clinic. It will not be stored at home. NOTE: This sheet is a summary. It may not cover all possible information. If you have questions about this medicine, talk to your doctor, pharmacist, or health care provider.  2024 Elsevier/Gold Standard (2022-04-16 00:00:00)  Leucovorin Injection What is this medication? LEUCOVORIN (loo koe VOR in) prevents side effects from certain medications, such as methotrexate. It works by increasing folate levels. This helps protect healthy cells in your body. It  may also be used to treat anemia caused by low levels of folate. It can also be used with fluorouracil, a type of chemotherapy, to treat colorectal cancer. It works by increasing the effects of fluorouracil in the body. This medicine may be used for other purposes; ask your health care provider or pharmacist if you have questions. What should I tell my care team before I take this medication? They need to know if you have any of these conditions: Anemia from low levels of vitamin B12 in the blood An unusual or allergic reaction to leucovorin, folic acid, other medications, foods, dyes, or preservatives Pregnant or trying to get pregnant Breastfeeding How should I use this medication? This medication is injected into a vein or a muscle. It is given by your care  team in a hospital or clinic setting. Talk to your care team about the use of this medication in children. Special care may be needed. Overdosage: If you think you have taken too much of this medicine contact a poison control center or emergency room at once. NOTE: This medicine is only for you. Do not share this medicine with others. What if I miss a dose? Keep appointments for follow-up doses. It is important not to miss your dose. Call your care team if you are unable to keep an appointment. What may interact with this medication? Capecitabine Fluorouracil Phenobarbital Phenytoin Primidone Trimethoprim;sulfamethoxazole This list may not describe all possible interactions. Give your health care provider a list of all the medicines, herbs, non-prescription drugs, or dietary supplements you use. Also tell them if you smoke, drink alcohol, or use illegal drugs. Some items may interact with your medicine. What should I watch for while using this medication? Your condition will be monitored carefully while you are receiving this medication. This medication may increase the side effects of 5-fluorouracil. Tell your care team if you have  diarrhea or mouth sores that do not get better or that get worse. What side effects may I notice from receiving this medication? Side effects that you should report to your care team as soon as possible: Allergic reactions--skin rash, itching, hives, swelling of the face, lips, tongue, or throat This list may not describe all possible side effects. Call your doctor for medical advice about side effects. You may report side effects to FDA at 1-800-FDA-1088. Where should I keep my medication? This medication is given in a hospital or clinic. It will not be stored at home. NOTE: This sheet is a summary. It may not cover all possible information. If you have questions about this medicine, talk to your doctor, pharmacist, or health care provider.  2024 Elsevier/Gold Standard (2022-05-08 00:00:00)  Fluorouracil Injection What is this medication? FLUOROURACIL (flure oh YOOR a sil) treats some types of cancer. It works by slowing down the growth of cancer cells. This medicine may be used for other purposes; ask your health care provider or pharmacist if you have questions. COMMON BRAND NAME(S): Adrucil What should I tell my care team before I take this medication? They need to know if you have any of these conditions: Blood disorders Dihydropyrimidine dehydrogenase (DPD) deficiency Infection, such as chickenpox, cold sores, herpes Kidney disease Liver disease Poor nutrition Recent or ongoing radiation therapy An unusual or allergic reaction to fluorouracil, other medications, foods, dyes, or preservatives If you or your partner are pregnant or trying to get pregnant Breast-feeding How should I use this medication? This medication is injected into a vein. It is administered by your care team in a hospital or clinic setting. Talk to your care team about the use of this medication in children. Special care may be needed. Overdosage: If you think you have taken too much of this medicine contact a  poison control center or emergency room at once. NOTE: This medicine is only for you. Do not share this medicine with others. What if I miss a dose? Keep appointments for follow-up doses. It is important not to miss your dose. Call your care team if you are unable to keep an appointment. What may interact with this medication? Do not take this medication with any of the following: Live virus vaccines This medication may also interact with the following: Medications that treat or prevent blood clots, such as warfarin, enoxaparin, dalteparin This list may  not describe all possible interactions. Give your health care provider a list of all the medicines, herbs, non-prescription drugs, or dietary supplements you use. Also tell them if you smoke, drink alcohol, or use illegal drugs. Some items may interact with your medicine. What should I watch for while using this medication? Your condition will be monitored carefully while you are receiving this medication. This medication may make you feel generally unwell. This is not uncommon as chemotherapy can affect healthy cells as well as cancer cells. Report any side effects. Continue your course of treatment even though you feel ill unless your care team tells you to stop. In some cases, you may be given additional medications to help with side effects. Follow all directions for their use. This medication may increase your risk of getting an infection. Call your care team for advice if you get a fever, chills, sore throat, or other symptoms of a cold or flu. Do not treat yourself. Try to avoid being around people who are sick. This medication may increase your risk to bruise or bleed. Call your care team if you notice any unusual bleeding. Be careful brushing or flossing your teeth or using a toothpick because you may get an infection or bleed more easily. If you have any dental work done, tell your dentist you are receiving this medication. Avoid taking  medications that contain aspirin, acetaminophen, ibuprofen, naproxen, or ketoprofen unless instructed by your care team. These medications may hide a fever. Do not treat diarrhea with over the counter products. Contact your care team if you have diarrhea that lasts more than 2 days or if it is severe and watery. This medication can make you more sensitive to the sun. Keep out of the sun. If you cannot avoid being in the sun, wear protective clothing and sunscreen. Do not use sun lamps, tanning beds, or tanning booths. Talk to your care team if you or your partner wish to become pregnant or think you might be pregnant. This medication can cause serious birth defects if taken during pregnancy and for 3 months after the last dose. A reliable form of contraception is recommended while taking this medication and for 3 months after the last dose. Talk to your care team about effective forms of contraception. Do not father a child while taking this medication and for 3 months after the last dose. Use a condom while having sex during this time period. Do not breastfeed while taking this medication. This medication may cause infertility. Talk to your care team if you are concerned about your fertility. What side effects may I notice from receiving this medication? Side effects that you should report to your care team as soon as possible: Allergic reactions--skin rash, itching, hives, swelling of the face, lips, tongue, or throat Heart attack--pain or tightness in the chest, shoulders, arms, or jaw, nausea, shortness of breath, cold or clammy skin, feeling faint or lightheaded Heart failure--shortness of breath, swelling of the ankles, feet, or hands, sudden weight gain, unusual weakness or fatigue Heart rhythm changes--fast or irregular heartbeat, dizziness, feeling faint or lightheaded, chest pain, trouble breathing High ammonia level--unusual weakness or fatigue, confusion, loss of appetite, nausea, vomiting,  seizures Infection--fever, chills, cough, sore throat, wounds that don't heal, pain or trouble when passing urine, general feeling of discomfort or being unwell Low red blood cell level--unusual weakness or fatigue, dizziness, headache, trouble breathing Pain, tingling, or numbness in the hands or feet, muscle weakness, change in vision, confusion or trouble speaking,  loss of balance or coordination, trouble walking, seizures Redness, swelling, and blistering of the skin over hands and feet Severe or prolonged diarrhea Unusual bruising or bleeding Side effects that usually do not require medical attention (report to your care team if they continue or are bothersome): Dry skin Headache Increased tears Nausea Pain, redness, or swelling with sores inside the mouth or throat Sensitivity to light Vomiting This list may not describe all possible side effects. Call your doctor for medical advice about side effects. You may report side effects to FDA at 1-800-FDA-1088. Where should I keep my medication? This medication is given in a hospital or clinic. It will not be stored at home. NOTE: This sheet is a summary. It may not cover all possible information. If you have questions about this medicine, talk to your doctor, pharmacist, or health care provider.  2024 Elsevier/Gold Standard (2022-04-10 00:00:00)

## 2023-11-20 NOTE — Progress Notes (Signed)
[facility] OFFICE PROGRESS NOTE   Diagnosis: Pancreas cancer  INTERVAL HISTORY:   David [name] returns as scheduled.  He completed cycle 11 FOLFIRINOX 11/05/2023.  He denies nausea/vomiting.  No mouth sores.  No diarrhea.  Cold sensitivity lasted 3 to 4 days.  He has intermittent tingling in the fingertips, no significant tingling in the toes.  Following the last cycle he had hiccups mainly at nighttime for 3 nights.  Hiccups in general have been less intense.  Objective:  Vital signs in last 24 hours:  Blood pressure 137/80, pulse 79, temperature 97.9 F (36.6 C), height 5\' 8"  (1.727 m), weight 158 lb 12.8 oz (72 kg), SpO2 100%.    HEENT: No thrush or ulcers. Resp: Lungs clear bilaterally Cardio: Regular rate and rhythm. GI: Abdomen soft and nontender.  No hepatosplenomegaly.  No mass. Vascular: No leg edema. Neuro: Vibratory sense intact over the fingertips per tuning fork exam. Skin: Palms with mild dryness, hyperpigmentation. Port-A-Cath without erythema.  Lab Results:  Lab Results  Component Value Date   WBC 4.9 11/20/2023   HGB 11.9 (L) 11/20/2023   HCT 34.9 (L) 11/20/2023   MCV 102.3 (H) 11/20/2023   PLT 107 (L) 11/20/2023   NEUTROABS 3.0 11/20/2023    Imaging:  No results found.  Medications: I have reviewed the patient's current medications.  Assessment/Plan: Pancreas cancer CT angiogram chest 04/24/2023-low-density mass in the dome of the liver with at least 3 additional lesions in both hepatic lobes, borderline lymphadenopathy in the hepatoduodenal ligament Right upper quadrant ultrasound 04/24/2023-large circumscribed mass of the liver dome CT abdomen/pelvis 04/24/2023-multiple liver lesions poorly defined on noncontrast exam, prominent portacaval and porta hepatis nodes MRI abdomen 05/05/2023-multiple rim-hypoenhancing liver lesions consistent with hepatic metastases, suspicion for a pancreas tail mass Ultrasound-guided biopsy of dominant right  liver lesion 05/10/2023-adenocarcinoma, CK7 and CDX2 positive, abundant cytoplasm and mucin with extracellular mucin, foci of lymphovascular invasion Tempus gene panel-K-ras G12V, T P53 mild tumor mutation burden 3.2, MSS PET 06/04/2023-multiple hypermetabolic liver lesions consistent with metastases, low-level FDG uptake in the soft tissue fullness at the tip of the pancreas tail, low-level FDG activity involving a small soft tissue nodule between the gastric fundus and spleen, tiny foci of accumulation at the right costovertebral junction at T10 and roof of the left acetabulum without an underlying CT lesion Evaded CEA and CA 19-9 EUS 06/07/2023 ,22x 15 mm irregular mass in the tail the pancreas, 11 mm subcarinal lymph node, FNA biopsy of the pancreas mass-suspicious for malignancy Cycle 1 FOLFIRINOX 06/19/2023 Cycle 2 FOLFIRINOX 07/03/2023 Cycle 3 FOLFIRINOX 07/17/2023 Cycle 4 FOLFIRINOX 07/31/2023 Cycle 5 FOLFIRINOX 08/14/2023, oxaliplatin and Decadron dose reduced Cycle 6 FOLFIRINOX 08/28/2023 Cycle 7 FOLFIRINOX 09/11/2023 CT[facility] 09/16/2023, compared to 04/24/2023-unchanged pancreas tail mass, decrease in attenuation of hepatic metastases, several lesions have decreased in size, no new lesions Cycle 8 FOLFIRINOX 09/25/2023 Cycle 9 FOLFIRINOX 10/09/2023 Cycle 10 FOLFIRINOX 10/22/2023 Cycle 11 FOLFIRINOX 11/05/2023 Cycle 12 FOLFIRINOX 11/20/2023 Family history of pancreas cancerINVITAE panel 05/17/2023-NF1 VUS Severe hiccups following cycle 1 FOLFIRINOX, did not respond to baclofen or gabapentin.  Resolved with Reglan.  Recurrent hiccups following cycle 4 FOLFIRINOX-not relieved with Reglan      Disposition: David [name] appears stable.  He has completed 11 cycles of FOLFIRINOX.  He continues to tolerate treatment well.  Plan to proceed with cycle 12 today as scheduled.  CBC reviewed counts adequate to proceed with treatment.  He has stable mild thrombocytopenia.  He will contact the office  with  bleeding.  He will return for follow-up and treatment in 2 weeks.    Lonna Cobb ANP/GNP-BC   11/20/2023  9:54 AM

## 2023-11-20 NOTE — Progress Notes (Signed)
Patient seen by Lonna Cobb NP today  Vitals are within treatment parameters:Yes   Labs are within treatment parameters: Yes   Treatment plan has been signed: Yes   Per physician team, Patient is ready for treatment and there are NO modifications to the treatment plan.

## 2023-11-20 NOTE — Patient Instructions (Signed)

## 2023-11-21 LAB — CANCER ANTIGEN 19-9: CA 19-9: 251 U/mL — ABNORMAL HIGH (ref 0–35)

## 2023-11-22 ENCOUNTER — Inpatient Hospital Stay: Payer: No Typology Code available for payment source

## 2023-11-22 VITALS — BP 143/82 | HR 92 | Temp 98.3°F | Resp 18

## 2023-11-22 DIAGNOSIS — C259 Malignant neoplasm of pancreas, unspecified: Secondary | ICD-10-CM

## 2023-11-22 DIAGNOSIS — Z5111 Encounter for antineoplastic chemotherapy: Secondary | ICD-10-CM | POA: Diagnosis not present

## 2023-11-22 MED ORDER — SODIUM CHLORIDE 0.9% FLUSH
10.0000 mL | INTRAVENOUS | Status: DC | PRN
  Administered 2023-11-22: 10 mL

## 2023-11-22 MED ORDER — HEPARIN SOD (PORK) LOCK FLUSH 100 UNIT/ML IV SOLN
500.0000 [IU] | Freq: Once | INTRAVENOUS | Status: AC | PRN
  Administered 2023-11-22: 500 [IU]

## 2023-11-22 MED ORDER — PEGFILGRASTIM-JMDB 6 MG/0.6ML ~~LOC~~ SOSY
6.0000 mg | PREFILLED_SYRINGE | Freq: Once | SUBCUTANEOUS | Status: AC
Start: 2023-11-22 — End: 2023-11-22
  Administered 2023-11-22: 6 mg via SUBCUTANEOUS
  Filled 2023-11-22: qty 0.6

## 2023-11-22 NOTE — Patient Instructions (Signed)

## 2023-12-01 ENCOUNTER — Other Ambulatory Visit: Payer: Self-pay | Admitting: Oncology

## 2023-12-04 ENCOUNTER — Inpatient Hospital Stay: Payer: No Typology Code available for payment source

## 2023-12-04 ENCOUNTER — Inpatient Hospital Stay (HOSPITAL_BASED_OUTPATIENT_CLINIC_OR_DEPARTMENT_OTHER): Payer: No Typology Code available for payment source | Admitting: Oncology

## 2023-12-04 ENCOUNTER — Other Ambulatory Visit: Payer: 59

## 2023-12-04 VITALS — BP 128/87 | HR 100 | Temp 98.1°F | Resp 18 | Ht 68.0 in | Wt 157.4 lb

## 2023-12-04 DIAGNOSIS — C787 Secondary malignant neoplasm of liver and intrahepatic bile duct: Secondary | ICD-10-CM | POA: Diagnosis not present

## 2023-12-04 DIAGNOSIS — Z5111 Encounter for antineoplastic chemotherapy: Secondary | ICD-10-CM | POA: Diagnosis not present

## 2023-12-04 DIAGNOSIS — C259 Malignant neoplasm of pancreas, unspecified: Secondary | ICD-10-CM | POA: Diagnosis not present

## 2023-12-04 LAB — CMP (CANCER CENTER ONLY)
ALT: 32 U/L (ref 0–44)
AST: 27 U/L (ref 15–41)
Albumin: 3.8 g/dL (ref 3.5–5.0)
Alkaline Phosphatase: 227 U/L — ABNORMAL HIGH (ref 38–126)
Anion gap: 8 (ref 5–15)
BUN: 13 mg/dL (ref 6–20)
CO2: 25 mmol/L (ref 22–32)
Calcium: 9.1 mg/dL (ref 8.9–10.3)
Chloride: 101 mmol/L (ref 98–111)
Creatinine: 0.76 mg/dL (ref 0.61–1.24)
GFR, Estimated: >60 mL/min (ref >60)
Glucose, Bld: 279 mg/dL — ABNORMAL HIGH (ref 70–99)
Potassium: 3.8 mmol/L (ref 3.5–5.1)
Sodium: 134 mmol/L — ABNORMAL LOW (ref 135–145)
Total Bilirubin: 0.7 mg/dL (ref <1.2)
Total Protein: 6.7 g/dL (ref 6.5–8.1)

## 2023-12-04 LAB — CBC WITH DIFFERENTIAL (CANCER CENTER ONLY)
Abs Immature Granulocytes: 0.07 10*3/uL (ref 0.00–0.07)
Basophils Absolute: 0 10*3/uL (ref 0.0–0.1)
Basophils Relative: 0 %
Eosinophils Absolute: 0.1 10*3/uL (ref 0.0–0.5)
Eosinophils Relative: 2 %
HCT: 35.8 % — ABNORMAL LOW (ref 39.0–52.0)
Hemoglobin: 12.1 g/dL — ABNORMAL LOW (ref 13.0–17.0)
Immature Granulocytes: 2 %
Lymphocytes Relative: 23 %
Lymphs Abs: 1.1 10*3/uL (ref 0.7–4.0)
MCH: 34.9 pg — ABNORMAL HIGH (ref 26.0–34.0)
MCHC: 33.8 g/dL (ref 30.0–36.0)
MCV: 103.2 fL — ABNORMAL HIGH (ref 80.0–100.0)
Monocytes Absolute: 0.6 10*3/uL (ref 0.1–1.0)
Monocytes Relative: 14 %
Neutro Abs: 2.8 10*3/uL (ref 1.7–7.7)
Neutrophils Relative %: 59 %
Platelet Count: 133 10*3/uL — ABNORMAL LOW (ref 150–400)
RBC: 3.47 MIL/uL — ABNORMAL LOW (ref 4.22–5.81)
RDW: 13.8 % (ref 11.5–15.5)
WBC Count: 4.6 10*3/uL (ref 4.0–10.5)
nRBC: 0 % (ref 0.0–0.2)

## 2023-12-04 MED ORDER — SODIUM CHLORIDE 0.9% FLUSH
10.0000 mL | INTRAVENOUS | Status: DC | PRN
  Administered 2023-12-04: 10 mL

## 2023-12-04 MED ORDER — LEUCOVORIN CALCIUM INJECTION 350 MG
400.0000 mg/m2 | Freq: Once | INTRAMUSCULAR | Status: AC
  Administered 2023-12-04: 744 mg via INTRAVENOUS
  Filled 2023-12-04: qty 37.2

## 2023-12-04 MED ORDER — SODIUM CHLORIDE 0.9 % IV SOLN
150.0000 mg/m2 | Freq: Once | INTRAVENOUS | Status: AC
  Administered 2023-12-04: 300 mg via INTRAVENOUS
  Filled 2023-12-04: qty 15

## 2023-12-04 MED ORDER — PALONOSETRON HCL INJECTION 0.25 MG/5ML
0.2500 mg | Freq: Once | INTRAVENOUS | Status: AC
  Administered 2023-12-04: 0.25 mg via INTRAVENOUS
  Filled 2023-12-04: qty 5

## 2023-12-04 MED ORDER — SODIUM CHLORIDE 0.9 % IV SOLN
150.0000 mg | Freq: Once | INTRAVENOUS | Status: AC
  Administered 2023-12-04: 150 mg via INTRAVENOUS
  Filled 2023-12-04: qty 150

## 2023-12-04 MED ORDER — OXALIPLATIN CHEMO INJECTION 100 MG/20ML
65.0000 mg/m2 | Freq: Once | INTRAVENOUS | Status: AC
  Administered 2023-12-04: 120 mg via INTRAVENOUS
  Filled 2023-12-04: qty 20

## 2023-12-04 MED ORDER — ATROPINE SULFATE 1 MG/ML IV SOLN
0.5000 mg | Freq: Once | INTRAVENOUS | Status: AC | PRN
  Administered 2023-12-04: 0.5 mg via INTRAVENOUS
  Filled 2023-12-04: qty 1

## 2023-12-04 MED ORDER — DEXTROSE 5 % IV SOLN: Freq: Once | INTRAVENOUS | Status: AC

## 2023-12-04 MED ORDER — DEXAMETHASONE SODIUM PHOSPHATE 10 MG/ML IJ SOLN
5.0000 mg | Freq: Once | INTRAMUSCULAR | Status: AC
  Administered 2023-12-04: 5 mg via INTRAVENOUS
  Filled 2023-12-04: qty 1

## 2023-12-04 MED ORDER — SODIUM CHLORIDE 0.9 % IV SOLN
2400.0000 mg/m2 | INTRAVENOUS | Status: DC
  Administered 2023-12-04: 4450 mg via INTRAVENOUS
  Filled 2023-12-04: qty 89

## 2023-12-04 NOTE — Progress Notes (Signed)
[facility] OFFICE PROGRESS NOTE   Diagnosis: Pancreas cancer  INTERVAL HISTORY:   David [name] complete another cycle of FOLFIRINOX 11/20/2023.  He has hiccups for approximately 3 days following chemotherapy.  Thorazine improves the hiccups.  No nausea/vomiting, mouth sores, or diarrhea.  He has cold sensitivity for several days following chemotherapy.  No neuropathy symptoms at present.  No abdominal pain.  Objective:  Vital signs in last 24 hours:  Blood pressure 128/87, pulse 100, temperature 98.1 F (36.7 C), temperature source Temporal, resp. rate 18, height 5\' 8"  (1.727 m), weight 157 lb 6.4 oz (71.4 kg), SpO2 98%.    HEENT: No thrush or ulcers Resp: Lungs clear bilaterally Cardio: Regular rate and rhythm GI: No hepatosplenomegaly Vascular: No leg edema Neuro: Very mild loss of vibratory sense at the fingertips bilaterally   Portacath/PICC-without erythema  Lab Results:  Lab Results  Component Value Date   WBC 4.6 12/04/2023   HGB 12.1 (L) 12/04/2023   HCT 35.8 (L) 12/04/2023   MCV 103.2 (H) 12/04/2023   PLT 133 (L) 12/04/2023   NEUTROABS 2.8 12/04/2023    CMP  Lab Results  Component Value Date   NA 137 11/20/2023   K 4.1 11/20/2023   CL 103 11/20/2023   CO2 24 11/20/2023   GLUCOSE 216 (H) 11/20/2023   BUN 19 11/20/2023   CREATININE 0.91 11/20/2023   CALCIUM 9.2 11/20/2023   PROT 6.9 11/20/2023   ALBUMIN 3.9 11/20/2023   AST 27 11/20/2023   ALT 29 11/20/2023   ALKPHOS 197 (H) 11/20/2023   BILITOT 0.5 11/20/2023   GFRNONAA >60 11/20/2023   GFRAA >60 03/26/2018    Lab Results  Component Value Date   CEA 772.45 (H) 05/02/2023   CAN199 251 (H) 11/20/2023    Lab Results  Component Value Date   INR 0.9 05/10/2023   LABPROT 12.7 05/10/2023    Imaging:  No results found.  Medications: I have reviewed the patient's current medications.   Assessment/Plan: Pancreas cancer CT angiogram chest 04/24/2023-low-density mass in the  dome of the liver with at least 3 additional lesions in both hepatic lobes, borderline lymphadenopathy in the hepatoduodenal ligament Right upper quadrant ultrasound 04/24/2023-large circumscribed mass of the liver dome CT abdomen/pelvis 04/24/2023-multiple liver lesions poorly defined on noncontrast exam, prominent portacaval and porta hepatis nodes MRI abdomen 05/05/2023-multiple rim-hypoenhancing liver lesions consistent with hepatic metastases, suspicion for a pancreas tail mass Ultrasound-guided biopsy of dominant right liver lesion 05/10/2023-adenocarcinoma, CK7 and CDX2 positive, abundant cytoplasm and mucin with extracellular mucin, foci of lymphovascular invasion Tempus gene panel-K-ras G12V, T P53 mild tumor mutation burden 3.2, MSS PET 06/04/2023-multiple hypermetabolic liver lesions consistent with metastases, low-level FDG uptake in the soft tissue fullness at the tip of the pancreas tail, low-level FDG activity involving a small soft tissue nodule between the gastric fundus and spleen, tiny foci of accumulation at the right costovertebral junction at T10 and roof of the left acetabulum without an underlying CT lesion Evaded CEA and CA 19-9 EUS 06/07/2023 ,22x 15 mm irregular mass in the tail the pancreas, 11 mm subcarinal lymph node, FNA biopsy of the pancreas mass-suspicious for malignancy Cycle 1 FOLFIRINOX 06/19/2023 Cycle 2 FOLFIRINOX 07/03/2023 Cycle 3 FOLFIRINOX 07/17/2023 Cycle 4 FOLFIRINOX 07/31/2023 Cycle 5 FOLFIRINOX 08/14/2023, oxaliplatin and Decadron dose reduced Cycle 6 FOLFIRINOX 08/28/2023 Cycle 7 FOLFIRINOX 09/11/2023 CT[facility] at [facility] 09/16/2023, compared to 04/24/2023-unchanged pancreas tail mass, decrease in attenuation of hepatic metastases, several lesions have decreased in size, no new lesions Cycle  8 FOLFIRINOX 09/25/2023 Cycle 9 FOLFIRINOX 10/09/2023 Cycle 10 FOLFIRINOX 10/22/2023 Cycle 11 FOLFIRINOX 11/05/2023 Cycle 12 FOLFIRINOX 11/20/2023 Cycle 13 FOLFIRINOX 12/04/2023 Family  history of pancreas cancerINVITAE panel 05/17/2023-NF1 VUS Severe hiccups following cycle 1 FOLFIRINOX, did not respond to baclofen or gabapentin.  Resolved with Reglan.  Recurrent hiccups following cycle 4 FOLFIRINOX-not relieved with Reglan     Disposition: David [name] appears stable.  He has completed 12 cycles of FOLFIRINOX.  The CA 19-9 was again lower on 11/20/2023.  He has not developed significant neuropathy symptoms.  He understands the chance of developing neuropathy with further oxaliplatin chemotherapy.  He would like to proceed.  He will complete cycle 13 today.  He will return for an office visit and chemotherapy in 2 weeks.  We discussed holding the 12/19/2023 treatment until after he undergoes restaging CTs at [facility].  He would like to proceed with treatment on 12/19/2023.    Thornton Papas, MD  12/04/2023  8:53 AM

## 2023-12-04 NOTE — Patient Instructions (Addendum)
[facility]  Discharge Instructions: The chemotherapy medication bag should finish at 46 hours, 96 hours, or 7 days. For example, if your pump is scheduled for 46 hours and it was put on at 4:00 p.m., it should finish at 2:00 p.m. the day it is scheduled to come off regardless of your appointment time.     Estimated time to finish at 12/06/23 1PM   If the display on your pump reads "Low Volume" and it is beeping, take the batteries out of the pump and come to the cancer center for it to be taken off.   If the pump alarms go off prior to the pump reading "Low Volume" then call [phone] and someone can assist you.  If the plunger comes out and the chemotherapy medication is leaking out, please use your home chemo spill kit to clean up the spill. Do NOT use paper towels or other household products.  If you have problems or questions regarding your pump, please call either [phone] (24 hours a day) or the cancer center Monday-Friday 8:00 a.m.- 4:30 p.m. at the clinic number and we will assist you. If you are unable to get assistance, then go to the nearest Emergency Department and ask the staff to contact the IV team for assistance.   Thank you for choosing [facility] to provide your oncology and hematology care.   If you have a lab appointment with the Cancer Center, please go directly to the Cancer Center and check in at the registration area.   Wear comfortable clothing and clothing appropriate for easy access to any Portacath or PICC line.   We strive to give you quality time with your provider. You may need to reschedule your appointment if you arrive late (15 or more minutes).  Arriving late affects you and other patients whose appointments are after yours.  Also, if you miss three or more appointments without notifying the office, you may be dismissed from the clinic at the provider's discretion.      For  prescription refill requests, have your pharmacy contact our office and allow 72 hours for refills to be completed.    Today you received the following chemotherapy and/or immunotherapy agents oxaliplatin, irinotecan. Leucovorin, fluorouracil    To help prevent nausea and vomiting after your treatment, we encourage you to take your nausea medication as directed.  BELOW ARE SYMPTOMS THAT SHOULD BE REPORTED IMMEDIATELY: *FEVER GREATER THAN 100.4 F (38 C) OR HIGHER *CHILLS OR SWEATING *NAUSEA AND VOMITING THAT IS NOT CONTROLLED WITH YOUR NAUSEA MEDICATION *UNUSUAL SHORTNESS OF BREATH *UNUSUAL BRUISING OR BLEEDING *URINARY PROBLEMS (pain or burning when urinating, or frequent urination) *BOWEL PROBLEMS (unusual diarrhea, constipation, pain near the anus) TENDERNESS IN MOUTH AND THROAT WITH OR WITHOUT PRESENCE OF ULCERS (sore throat, sores in mouth, or a toothache) UNUSUAL RASH, SWELLING OR PAIN  UNUSUAL VAGINAL DISCHARGE OR ITCHING   Items with * indicate a potential emergency and should be followed up as soon as possible or go to the Emergency Department if any problems should occur.  Please show the CHEMOTHERAPY ALERT CARD or IMMUNOTHERAPY ALERT CARD at check-in to the Emergency Department and triage nurse.  Should you have questions after your visit or need to cancel or reschedule your appointment, please contact [facility]  Dept: [phone]  and follow the prompts.  Office hours are 8:00 a.m.  to 4:30 p.m. Monday - Friday. Please note that voicemails left after 4:00 p.m. may not be returned until the following business day.  We are closed weekends and major holidays. You have access to a nurse at all times for urgent questions. Please call the main number to the clinic Dept: [phone] and follow the prompts.   For any non-urgent questions, you may also contact your provider using MyChart. We now offer e-Visits for anyone 42 and  older to request care online for non-urgent symptoms. For details visit [email or web address].   Also download the MyChart app! Go to the app store, search "MyChart", open the app, select [facility], and log in with your MyChart username and password.

## 2023-12-04 NOTE — Progress Notes (Signed)
Patient seen by Dr. Thornton Papas today  Vitals are within treatment parameters:Yes   Labs are within treatment parameters: Yes   Treatment plan has been signed: Yes   Per physician team, Patient is ready for treatment and there are NO modifications to the treatment plan.

## 2023-12-05 LAB — CANCER ANTIGEN 19-9: CA 19-9: 222 U/mL — ABNORMAL HIGH (ref 0–35)

## 2023-12-06 ENCOUNTER — Inpatient Hospital Stay: Payer: No Typology Code available for payment source

## 2023-12-06 ENCOUNTER — Other Ambulatory Visit (HOSPITAL_BASED_OUTPATIENT_CLINIC_OR_DEPARTMENT_OTHER): Payer: Self-pay

## 2023-12-06 VITALS — BP 125/78 | HR 100 | Temp 98.2°F | Resp 18

## 2023-12-06 DIAGNOSIS — C259 Malignant neoplasm of pancreas, unspecified: Secondary | ICD-10-CM

## 2023-12-06 DIAGNOSIS — Z5111 Encounter for antineoplastic chemotherapy: Secondary | ICD-10-CM | POA: Diagnosis not present

## 2023-12-06 MED ORDER — HEPARIN SOD (PORK) LOCK FLUSH 100 UNIT/ML IV SOLN
500.0000 [IU] | Freq: Once | INTRAVENOUS | Status: AC | PRN
Start: 2023-12-06 — End: 2023-12-06
  Administered 2023-12-06: 500 [IU]

## 2023-12-06 MED ORDER — PEGFILGRASTIM-JMDB 6 MG/0.6ML ~~LOC~~ SOSY
6.0000 mg | PREFILLED_SYRINGE | Freq: Once | SUBCUTANEOUS | Status: AC
  Administered 2023-12-06: 6 mg via SUBCUTANEOUS
  Filled 2023-12-06: qty 0.6

## 2023-12-06 MED ORDER — SODIUM CHLORIDE 0.9% FLUSH
10.0000 mL | INTRAVENOUS | Status: DC | PRN
  Administered 2023-12-06: 10 mL

## 2023-12-06 NOTE — Patient Instructions (Signed)

## 2023-12-15 ENCOUNTER — Other Ambulatory Visit: Payer: Self-pay | Admitting: Oncology

## 2023-12-16 ENCOUNTER — Encounter: Payer: Self-pay | Admitting: Oncology

## 2023-12-19 ENCOUNTER — Encounter: Payer: Self-pay | Admitting: Nurse Practitioner

## 2023-12-19 ENCOUNTER — Inpatient Hospital Stay: Payer: 59 | Attending: Hematology

## 2023-12-19 ENCOUNTER — Encounter: Payer: Self-pay | Admitting: Oncology

## 2023-12-19 ENCOUNTER — Inpatient Hospital Stay (HOSPITAL_BASED_OUTPATIENT_CLINIC_OR_DEPARTMENT_OTHER): Payer: 59 | Admitting: Nurse Practitioner

## 2023-12-19 ENCOUNTER — Inpatient Hospital Stay: Payer: 59

## 2023-12-19 ENCOUNTER — Other Ambulatory Visit: Payer: 59

## 2023-12-19 VITALS — BP 141/75 | HR 75

## 2023-12-19 VITALS — BP 138/78 | HR 90 | Temp 98.1°F | Resp 18 | Ht 68.0 in | Wt 157.9 lb

## 2023-12-19 DIAGNOSIS — C787 Secondary malignant neoplasm of liver and intrahepatic bile duct: Secondary | ICD-10-CM | POA: Diagnosis present

## 2023-12-19 DIAGNOSIS — C252 Malignant neoplasm of tail of pancreas: Secondary | ICD-10-CM | POA: Diagnosis present

## 2023-12-19 DIAGNOSIS — R066 Hiccough: Secondary | ICD-10-CM | POA: Insufficient documentation

## 2023-12-19 DIAGNOSIS — C259 Malignant neoplasm of pancreas, unspecified: Secondary | ICD-10-CM

## 2023-12-19 DIAGNOSIS — Z5111 Encounter for antineoplastic chemotherapy: Secondary | ICD-10-CM | POA: Insufficient documentation

## 2023-12-19 DIAGNOSIS — Z800 Family history of malignant neoplasm of digestive organs: Secondary | ICD-10-CM | POA: Diagnosis not present

## 2023-12-19 DIAGNOSIS — Z452 Encounter for adjustment and management of vascular access device: Secondary | ICD-10-CM | POA: Insufficient documentation

## 2023-12-19 DIAGNOSIS — Z5189 Encounter for other specified aftercare: Secondary | ICD-10-CM | POA: Diagnosis not present

## 2023-12-19 LAB — CBC WITH DIFFERENTIAL (CANCER CENTER ONLY)
Abs Immature Granulocytes: 0.05 10*3/uL (ref 0.00–0.07)
Basophils Absolute: 0 10*3/uL (ref 0.0–0.1)
Basophils Relative: 0 %
Eosinophils Absolute: 0.1 10*3/uL (ref 0.0–0.5)
Eosinophils Relative: 2 %
HCT: 34 % — ABNORMAL LOW (ref 39.0–52.0)
Hemoglobin: 11.6 g/dL — ABNORMAL LOW (ref 13.0–17.0)
Immature Granulocytes: 1 %
Lymphocytes Relative: 21 %
Lymphs Abs: 1.1 10*3/uL (ref 0.7–4.0)
MCH: 35.6 pg — ABNORMAL HIGH (ref 26.0–34.0)
MCHC: 34.1 g/dL (ref 30.0–36.0)
MCV: 104.3 fL — ABNORMAL HIGH (ref 80.0–100.0)
Monocytes Absolute: 0.6 10*3/uL (ref 0.1–1.0)
Monocytes Relative: 12 %
Neutro Abs: 3.5 10*3/uL (ref 1.7–7.7)
Neutrophils Relative %: 64 %
Platelet Count: 134 10*3/uL — ABNORMAL LOW (ref 150–400)
RBC: 3.26 MIL/uL — ABNORMAL LOW (ref 4.22–5.81)
RDW: 13.6 % (ref 11.5–15.5)
WBC Count: 5.4 10*3/uL (ref 4.0–10.5)
nRBC: 0 % (ref 0.0–0.2)

## 2023-12-19 LAB — CMP (CANCER CENTER ONLY)
ALT: 32 U/L (ref 0–44)
AST: 29 U/L (ref 15–41)
Albumin: 4 g/dL (ref 3.5–5.0)
Alkaline Phosphatase: 265 U/L — ABNORMAL HIGH (ref 38–126)
Anion gap: 8 (ref 5–15)
BUN: 19 mg/dL (ref 6–20)
CO2: 25 mmol/L (ref 22–32)
Calcium: 9.4 mg/dL (ref 8.9–10.3)
Chloride: 103 mmol/L (ref 98–111)
Creatinine: 0.91 mg/dL (ref 0.61–1.24)
GFR, Estimated: >60 mL/min (ref >60)
Glucose, Bld: 259 mg/dL — ABNORMAL HIGH (ref 70–99)
Potassium: 4 mmol/L (ref 3.5–5.1)
Sodium: 136 mmol/L (ref 135–145)
Total Bilirubin: 0.6 mg/dL (ref 0.0–1.2)
Total Protein: 7 g/dL (ref 6.5–8.1)

## 2023-12-19 LAB — FERRITIN: Ferritin: 318 ng/mL (ref 24–336)

## 2023-12-19 MED ORDER — DEXTROSE 5 % IV SOLN
Freq: Once | INTRAVENOUS | Status: AC
Start: 2023-12-19 — End: 2023-12-19

## 2023-12-19 MED ORDER — SODIUM CHLORIDE 0.9% FLUSH
10.0000 mL | INTRAVENOUS | Status: DC | PRN
  Administered 2023-12-19: 10 mL

## 2023-12-19 MED ORDER — PALONOSETRON HCL INJECTION 0.25 MG/5ML
0.2500 mg | Freq: Once | INTRAVENOUS | Status: AC
  Administered 2023-12-19: 0.25 mg via INTRAVENOUS
  Filled 2023-12-19: qty 5

## 2023-12-19 MED ORDER — OXALIPLATIN CHEMO INJECTION 100 MG/20ML
65.0000 mg/m2 | Freq: Once | INTRAVENOUS | Status: AC
  Administered 2023-12-19: 120 mg via INTRAVENOUS
  Filled 2023-12-19: qty 20

## 2023-12-19 MED ORDER — SODIUM CHLORIDE 0.9 % IV SOLN
400.0000 mg/m2 | Freq: Once | INTRAVENOUS | Status: AC
  Administered 2023-12-19: 744 mg via INTRAVENOUS
  Filled 2023-12-19: qty 37.2

## 2023-12-19 MED ORDER — SODIUM CHLORIDE 0.9 % IV SOLN
150.0000 mg/m2 | Freq: Once | INTRAVENOUS | Status: AC
  Administered 2023-12-19: 300 mg via INTRAVENOUS
  Filled 2023-12-19: qty 15

## 2023-12-19 MED ORDER — SODIUM CHLORIDE 0.9 % IV SOLN
150.0000 mg | Freq: Once | INTRAVENOUS | Status: AC
  Administered 2023-12-19: 150 mg via INTRAVENOUS
  Filled 2023-12-19: qty 150

## 2023-12-19 MED ORDER — ATROPINE SULFATE 1 MG/ML IV SOLN
0.5000 mg | Freq: Once | INTRAVENOUS | Status: AC | PRN
  Administered 2023-12-19: 0.5 mg via INTRAVENOUS
  Filled 2023-12-19: qty 1

## 2023-12-19 MED ORDER — FLUOROURACIL CHEMO INJECTION 5 GM/100ML
2400.0000 mg/m2 | INTRAVENOUS | Status: DC
  Administered 2023-12-19: 4450 mg via INTRAVENOUS
  Filled 2023-12-19: qty 89

## 2023-12-19 MED ORDER — DEXAMETHASONE SODIUM PHOSPHATE 10 MG/ML IJ SOLN
5.0000 mg | Freq: Once | INTRAMUSCULAR | Status: AC
  Administered 2023-12-19: 5 mg via INTRAVENOUS
  Filled 2023-12-19: qty 1

## 2023-12-19 NOTE — Progress Notes (Signed)
 [facility] OFFICE PROGRESS NOTE   Diagnosis: Pancreas cancer  INTERVAL HISTORY:   David [name] returns as scheduled.  He completed cycle 13 FOLFIRINOX 12/04/2023.  He denies nausea/vomiting.  No mouth sores.  No diarrhea.  Cold sensitivity lasted 3 to 4 days.  He notes intermittent tingling in the fingertips and toes.  Symptoms do not interfere with activity.  He had hiccups sporadically for about 5 days after treatment.  The hiccups mainly occur at nighttime.  Objective:  Vital signs in last 24 hours:  Blood pressure 138/78, pulse 90, temperature 98.1 F (36.7 C), temperature source Temporal, resp. rate 18, height 5' 8 (1.727 m), weight 157 lb 14.4 oz (71.6 kg), SpO2 100%.    HEENT: No thrush or ulcers. Resp: Lungs clear bilaterally. Cardio: Regular rate and rhythm. GI: No hepatosplenomegaly. Vascular: No leg edema. Neuro: Vibratory sense intact over the fingertips for tuning fork exam. Skin: Palms without erythema. Port-A-Cath without erythema.  Lab Results:  Lab Results  Component Value Date   WBC 5.4 12/19/2023   HGB 11.6 (L) 12/19/2023   HCT 34.0 (L) 12/19/2023   MCV 104.3 (H) 12/19/2023   PLT 134 (L) 12/19/2023   NEUTROABS 3.5 12/19/2023    Imaging:  No results found.  Medications: I have reviewed the patient's current medications.  Assessment/Plan: Pancreas cancer CT angiogram chest 04/24/2023-low-density mass in the dome of the liver with at least 3 additional lesions in both hepatic lobes, borderline lymphadenopathy in the hepatoduodenal ligament Right upper quadrant ultrasound 04/24/2023-large circumscribed mass of the liver dome CT abdomen/pelvis 04/24/2023-multiple liver lesions poorly defined on noncontrast exam, prominent portacaval and porta hepatis nodes MRI abdomen 05/05/2023-multiple rim-hypoenhancing liver lesions consistent with hepatic metastases, suspicion for a pancreas tail mass Ultrasound-guided biopsy of dominant right liver lesion  05/10/2023-adenocarcinoma, CK7 and CDX2 positive, abundant cytoplasm and mucin with extracellular mucin, foci of lymphovascular invasion Tempus gene panel-K-ras G12V, T P53 mild tumor mutation burden 3.2, MSS PET 06/04/2023-multiple hypermetabolic liver lesions consistent with metastases, low-level FDG uptake in the soft tissue fullness at the tip of the pancreas tail, low-level FDG activity involving a small soft tissue nodule between the gastric fundus and spleen, tiny foci of accumulation at the right costovertebral junction at T10 and roof of the left acetabulum without an underlying CT lesion Evaded CEA and CA 19-9 EUS 06/07/2023 ,22x 15 mm irregular mass in the tail the pancreas, 11 mm subcarinal lymph node, FNA biopsy of the pancreas mass-suspicious for malignancy Cycle 1 FOLFIRINOX 06/19/2023 Cycle 2 FOLFIRINOX 07/03/2023 Cycle 3 FOLFIRINOX 07/17/2023 Cycle 4 FOLFIRINOX 07/31/2023 Cycle 5 FOLFIRINOX 08/14/2023, oxaliplatin  and Decadron  dose reduced Cycle 6 FOLFIRINOX 08/28/2023 Cycle 7 FOLFIRINOX 09/11/2023 CTs at [facility] 09/16/2023, compared to 04/24/2023-unchanged pancreas tail mass, decrease in attenuation of hepatic metastases, several lesions have decreased in size, no new lesions Cycle 8 FOLFIRINOX 09/25/2023 Cycle 9 FOLFIRINOX 10/09/2023 Cycle 10 FOLFIRINOX 10/22/2023 Cycle 11 FOLFIRINOX 11/05/2023 Cycle 12 FOLFIRINOX 11/20/2023 Cycle 13 FOLFIRINOX 12/04/2023 Cycle 14 FOLFIRINOX 12/19/2023 Family history of pancreas cancerINVITAE panel 05/17/2023-NF1 VUS Severe hiccups following cycle 1 FOLFIRINOX, did not respond to baclofen or gabapentin .  Resolved with Reglan .  Recurrent hiccups following cycle 4 FOLFIRINOX-not relieved with Reglan     Disposition: David [name] appears stable.  He has completed 13 cycles of FOLFIRINOX.  He continues to tolerate treatment well.  He has developed mild intermittent tingling in the fingertips and toes.  He understands this is likely related to the oxaliplatin .  He  further understands the chance of  symptoms worsening.  He would like to proceed with FOLFIRINOX today as scheduled.  Plan to proceed with cycle 14 as scheduled.  He has follow-up with Dr. Wendell at [facility] next week.  CBC and chemistry panel reviewed.  Labs adequate to proceed as above.  He will return for follow-up and treatment 01/01/2024.  He will contact the office in the interim with any problems.  Olam Ned ANP/GNP-BC   12/19/2023  9:49 AM

## 2023-12-19 NOTE — Progress Notes (Signed)
 Patient seen by Olam Ned NP today  Vitals are within treatment parameters:Yes   Labs are within treatment parameters: Yes   Treatment plan has been signed: Yes   Per physician team, Patient is ready for treatment and there are NO modifications to the treatment plan.

## 2023-12-19 NOTE — Patient Instructions (Signed)
 [facility] - A DEPT OF [place]  Discharge Instructions: The chemotherapy medication bag should finish at 46 hours, 96 hours, or 7 days. For example, if your pump is scheduled for 46 hours and it was put on at 4:00 p.m., it should finish at 2:00 p.m. the day it is scheduled to come off regardless of your appointment time.     Estimated time to finish at 12/21/23 1PM   If the display on your pump reads Low Volume and it is beeping, take the batteries out of the pump and come to the cancer center for it to be taken off.   If the pump alarms go off prior to the pump reading Low Volume then call [phone] and someone can assist you.  If the plunger comes out and the chemotherapy medication is leaking out, please use your home chemo spill kit to clean up the spill. Do NOT use paper towels or other household products.  If you have problems or questions regarding your pump, please call either [phone] (24 hours a day) or the cancer center Monday-Friday 8:00 a.m.- 4:30 p.m. at the clinic number and we will assist you. If you are unable to get assistance, then go to the nearest Emergency Department and ask the staff to contact the IV team for assistance.   Thank you for choosing [facility] to provide your oncology and hematology care.   If you have a lab appointment with the Cancer Center, please go directly to the Cancer Center and check in at the registration area.   Wear comfortable clothing and clothing appropriate for easy access to any Portacath or PICC line.   We strive to give you quality time with your provider. You may need to reschedule your appointment if you arrive late (15 or more minutes).  Arriving late affects you and other patients whose appointments are after yours.  Also, if you miss three or more appointments without notifying the office, you may be dismissed from the clinic at the provider's discretion.      For  prescription refill requests, have your pharmacy contact our office and allow 72 hours for refills to be completed.    Today you received the following chemotherapy and/or immunotherapy agents oxaliplatin , irinotecan . Leucovorin , fluorouracil     To help prevent nausea and vomiting after your treatment, we encourage you to take your nausea medication as directed.  BELOW ARE SYMPTOMS THAT SHOULD BE REPORTED IMMEDIATELY: *FEVER GREATER THAN 100.4 F (38 C) OR HIGHER *CHILLS OR SWEATING *NAUSEA AND VOMITING THAT IS NOT CONTROLLED WITH YOUR NAUSEA MEDICATION *UNUSUAL SHORTNESS OF BREATH *UNUSUAL BRUISING OR BLEEDING *URINARY PROBLEMS (pain or burning when urinating, or frequent urination) *BOWEL PROBLEMS (unusual diarrhea, constipation, pain near the anus) TENDERNESS IN MOUTH AND THROAT WITH OR WITHOUT PRESENCE OF ULCERS (sore throat, sores in mouth, or a toothache) UNUSUAL RASH, SWELLING OR PAIN  UNUSUAL VAGINAL DISCHARGE OR ITCHING   Items with * indicate a potential emergency and should be followed up as soon as possible or go to the Emergency Department if any problems should occur.  Please show the CHEMOTHERAPY ALERT CARD or IMMUNOTHERAPY ALERT CARD at check-in to the Emergency Department and triage nurse.  Should you have questions after your visit or need to cancel or reschedule your appointment, please contact [facility]  Dept: [phone]  and follow the prompts.  Office hours are 8:00 a.m.  to 4:30 p.m. Monday - Friday. Please note that voicemails left after 4:00 p.m. may not be returned until the following business day.  We are closed weekends and major holidays. You have access to a nurse at all times for urgent questions. Please call the main number to the clinic Dept: [phone] and follow the prompts.   For any non-urgent questions, you may also contact your provider using MyChart. We now offer e-Visits for anyone 53 and  older to request care online for non-urgent symptoms. For details visit [email or web address].   Also download the MyChart app! Go to the app store, search MyChart, open the app, select [place], and log in with your MyChart username and password.

## 2023-12-19 NOTE — Patient Instructions (Signed)

## 2023-12-20 ENCOUNTER — Other Ambulatory Visit: Payer: Self-pay

## 2023-12-20 ENCOUNTER — Telehealth: Payer: Self-pay

## 2023-12-20 LAB — CANCER ANTIGEN 19-9: CA 19-9: 194 U/mL — ABNORMAL HIGH (ref 0–35)

## 2023-12-20 NOTE — Telephone Encounter (Signed)
 Patient gave verbal understanding and had no further questions or concerns

## 2023-12-20 NOTE — Telephone Encounter (Signed)
-----   Message from Olam Ned sent at 12/20/2023 10:48 AM EST ----- Sorry ----- Message ----- From: Marikay Garen RAMAN, LPN Sent: 07/22/7973   9:27 AM EST To: Olam MARLA Ned, NP  No patient name on the message ----- Message ----- From: Ned Olam MARLA, NP Sent: 12/19/2023  11:31 AM EST To: [facility]  Please let him know the ferritin level is normal.  Anemia is likely due to chemotherapy.

## 2023-12-21 ENCOUNTER — Inpatient Hospital Stay: Payer: 59

## 2023-12-21 VITALS — BP 134/73 | HR 91 | Temp 98.1°F | Resp 16

## 2023-12-21 DIAGNOSIS — Z5111 Encounter for antineoplastic chemotherapy: Secondary | ICD-10-CM | POA: Diagnosis not present

## 2023-12-21 DIAGNOSIS — C259 Malignant neoplasm of pancreas, unspecified: Secondary | ICD-10-CM

## 2023-12-21 MED ORDER — SODIUM CHLORIDE 0.9% FLUSH
10.0000 mL | INTRAVENOUS | Status: DC | PRN
  Administered 2023-12-21: 10 mL

## 2023-12-21 MED ORDER — PEGFILGRASTIM-JMDB 6 MG/0.6ML ~~LOC~~ SOSY
6.0000 mg | PREFILLED_SYRINGE | Freq: Once | SUBCUTANEOUS | Status: AC
Start: 2023-12-21 — End: 2023-12-21
  Administered 2023-12-21: 6 mg via SUBCUTANEOUS
  Filled 2023-12-21: qty 0.6

## 2023-12-21 MED ORDER — HEPARIN SOD (PORK) LOCK FLUSH 100 UNIT/ML IV SOLN
500.0000 [IU] | Freq: Once | INTRAVENOUS | Status: AC | PRN
  Administered 2023-12-21: 500 [IU]

## 2023-12-28 ENCOUNTER — Other Ambulatory Visit: Payer: Self-pay | Admitting: Oncology

## 2023-12-28 DIAGNOSIS — C259 Malignant neoplasm of pancreas, unspecified: Secondary | ICD-10-CM

## 2023-12-31 ENCOUNTER — Encounter: Payer: Self-pay | Admitting: Oncology

## 2024-01-01 ENCOUNTER — Inpatient Hospital Stay: Payer: 59

## 2024-01-01 ENCOUNTER — Inpatient Hospital Stay (HOSPITAL_BASED_OUTPATIENT_CLINIC_OR_DEPARTMENT_OTHER): Payer: 59 | Admitting: Oncology

## 2024-01-01 VITALS — BP 142/84 | HR 111 | Temp 98.1°F | Resp 18 | Ht 68.0 in | Wt 155.2 lb

## 2024-01-01 VITALS — HR 80

## 2024-01-01 DIAGNOSIS — C259 Malignant neoplasm of pancreas, unspecified: Secondary | ICD-10-CM

## 2024-01-01 DIAGNOSIS — Z5111 Encounter for antineoplastic chemotherapy: Secondary | ICD-10-CM | POA: Diagnosis not present

## 2024-01-01 DIAGNOSIS — C787 Secondary malignant neoplasm of liver and intrahepatic bile duct: Secondary | ICD-10-CM | POA: Diagnosis not present

## 2024-01-01 DIAGNOSIS — Z95828 Presence of other vascular implants and grafts: Secondary | ICD-10-CM

## 2024-01-01 LAB — CBC WITH DIFFERENTIAL (CANCER CENTER ONLY)
Abs Immature Granulocytes: 0.03 10*3/uL (ref 0.00–0.07)
Basophils Absolute: 0 10*3/uL (ref 0.0–0.1)
Basophils Relative: 0 %
Eosinophils Absolute: 0.1 10*3/uL (ref 0.0–0.5)
Eosinophils Relative: 3 %
HCT: 32.9 % — ABNORMAL LOW (ref 39.0–52.0)
Hemoglobin: 11.5 g/dL — ABNORMAL LOW (ref 13.0–17.0)
Immature Granulocytes: 1 %
Lymphocytes Relative: 25 %
Lymphs Abs: 1 10*3/uL (ref 0.7–4.0)
MCH: 35.9 pg — ABNORMAL HIGH (ref 26.0–34.0)
MCHC: 35 g/dL (ref 30.0–36.0)
MCV: 102.8 fL — ABNORMAL HIGH (ref 80.0–100.0)
Monocytes Absolute: 0.6 10*3/uL (ref 0.1–1.0)
Monocytes Relative: 14 %
Neutro Abs: 2.4 10*3/uL (ref 1.7–7.7)
Neutrophils Relative %: 57 %
Platelet Count: 95 10*3/uL — ABNORMAL LOW (ref 150–400)
RBC: 3.2 MIL/uL — ABNORMAL LOW (ref 4.22–5.81)
RDW: 13.2 % (ref 11.5–15.5)
WBC Count: 4.2 10*3/uL (ref 4.0–10.5)
nRBC: 0 % (ref 0.0–0.2)

## 2024-01-01 LAB — CMP (CANCER CENTER ONLY)
ALT: 28 U/L (ref 0–44)
AST: 25 U/L (ref 15–41)
Albumin: 4.1 g/dL (ref 3.5–5.0)
Alkaline Phosphatase: 245 U/L — ABNORMAL HIGH (ref 38–126)
Anion gap: 9 (ref 5–15)
BUN: 17 mg/dL (ref 6–20)
CO2: 25 mmol/L (ref 22–32)
Calcium: 9.5 mg/dL (ref 8.9–10.3)
Chloride: 102 mmol/L (ref 98–111)
Creatinine: 0.72 mg/dL (ref 0.61–1.24)
GFR, Estimated: >60 mL/min (ref >60)
Glucose, Bld: 286 mg/dL — ABNORMAL HIGH (ref 70–99)
Potassium: 4.1 mmol/L (ref 3.5–5.1)
Sodium: 136 mmol/L (ref 135–145)
Total Bilirubin: 0.7 mg/dL (ref 0.0–1.2)
Total Protein: 7 g/dL (ref 6.5–8.1)

## 2024-01-01 MED ORDER — LEUCOVORIN CALCIUM INJECTION 350 MG
400.0000 mg/m2 | Freq: Once | INTRAMUSCULAR | Status: AC
  Administered 2024-01-01: 744 mg via INTRAVENOUS
  Filled 2024-01-01: qty 37.2

## 2024-01-01 MED ORDER — SODIUM CHLORIDE 0.9 % IV SOLN
150.0000 mg | Freq: Once | INTRAVENOUS | Status: AC
  Administered 2024-01-01: 150 mg via INTRAVENOUS
  Filled 2024-01-01: qty 5

## 2024-01-01 MED ORDER — ATROPINE SULFATE 1 MG/ML IV SOLN
0.5000 mg | Freq: Once | INTRAVENOUS | Status: AC | PRN
  Administered 2024-01-01: 0.5 mg via INTRAVENOUS
  Filled 2024-01-01: qty 1

## 2024-01-01 MED ORDER — SODIUM CHLORIDE 0.9 % IV SOLN: Freq: Once | INTRAVENOUS | Status: AC

## 2024-01-01 MED ORDER — PALONOSETRON HCL INJECTION 0.25 MG/5ML
0.2500 mg | Freq: Once | INTRAVENOUS | Status: AC
Start: 2024-01-01 — End: 2024-01-01
  Administered 2024-01-01: 0.25 mg via INTRAVENOUS
  Filled 2024-01-01: qty 5

## 2024-01-01 MED ORDER — SODIUM CHLORIDE 0.9% FLUSH
10.0000 mL | INTRAVENOUS | Status: DC | PRN
Start: 2024-01-01 — End: 2024-01-01
  Administered 2024-01-01: 10 mL

## 2024-01-01 MED ORDER — SODIUM CHLORIDE 0.9 % IV SOLN
150.0000 mg/m2 | Freq: Once | INTRAVENOUS | Status: AC
  Administered 2024-01-01: 300 mg via INTRAVENOUS
  Filled 2024-01-01: qty 15

## 2024-01-01 MED ORDER — SODIUM CHLORIDE 0.9 % IV SOLN
2400.0000 mg/m2 | INTRAVENOUS | Status: DC
  Administered 2024-01-01: 4450 mg via INTRAVENOUS
  Filled 2024-01-01: qty 89

## 2024-01-01 MED ORDER — DEXAMETHASONE SODIUM PHOSPHATE 10 MG/ML IJ SOLN
5.0000 mg | Freq: Once | INTRAMUSCULAR | Status: AC
  Administered 2024-01-01: 5 mg via INTRAVENOUS
  Filled 2024-01-01: qty 1

## 2024-01-01 NOTE — Progress Notes (Signed)
 [facility] OFFICE PROGRESS NOTE   Diagnosis: Pancreas cancer  INTERVAL HISTORY:   David. David [name] complete another cycle FOLFIRINOX 12/19/2023.  He tolerated the chemotherapy well.  No nausea/vomiting or diarrhea.  He continues to have hiccups following chemotherapy.  The hiccups are partially improved with Thorazine .  He has developed mild numbness in the fingers and toes over the past week.  No cold sensitivity at present.  He underwent a restaging evaluation at [facility] 12/23/2023.  CTs revealed stable to slight decrease in size of hepatic metastases.  No new lesions.  Stable pancreas tail mass. He saw Dr. Ren Carne 12/30/2023.  He is not a candidate for surgery or placement of a hepatic infusion pump.  Objective:  Vital signs in last 24 hours:  Blood pressure (!) 142/84, pulse (!) 111, temperature 98.1 F (36.7 C), temperature source Temporal, resp. rate 18, height 5\' 8"  (1.727 m), weight 155 lb 3.2 oz (70.4 kg), SpO2 100%.    HEENT: No thrush or ulcers Resp: Lungs clear bilaterally Cardio: Regular rate and rhythm GI: No hepatosplenomegaly, no mass, no tenderness in the subxiphoid region Vascular: No leg edema Neuro: Mild to moderate loss of vibratory sense at several fingertips bilaterally, other fingers are normal Skin: Dryness of the hands  Portacath/PICC-without erythema  Lab Results:  Lab Results  Component Value Date   WBC 4.2 01/01/2024   HGB 11.5 (L) 01/01/2024   HCT 32.9 (L) 01/01/2024   MCV 102.8 (H) 01/01/2024   PLT 95 (L) 01/01/2024   NEUTROABS 2.4 01/01/2024    CMP  Lab Results  Component Value Date   NA 136 01/01/2024   K 4.1 01/01/2024   CL 102 01/01/2024   CO2 25 01/01/2024   GLUCOSE 286 (H) 01/01/2024   BUN 17 01/01/2024   CREATININE 0.72 01/01/2024   CALCIUM  9.5 01/01/2024   PROT 7.0 01/01/2024   ALBUMIN 4.1 01/01/2024   AST 25 01/01/2024   ALT 28 01/01/2024   ALKPHOS 245 (H) 01/01/2024   BILITOT 0.7 01/01/2024   GFRNONAA >60  01/01/2024   GFRAA >60 03/26/2018    Lab Results  Component Value Date   CEA 772.45 (H) 05/02/2023   CAN199 194 (H) 12/19/2023    Lab Results  Component Value Date   INR 0.9 05/10/2023   LABPROT 12.7 05/10/2023    Imaging:  No results found.  Medications: I have reviewed the patient's current medications.   Assessment/Plan: Pancreas cancer CT angiogram chest 04/24/2023-low-density mass in the dome of the liver with at least 3 additional lesions in both hepatic lobes, borderline lymphadenopathy in the hepatoduodenal ligament Right upper quadrant ultrasound 04/24/2023-large circumscribed mass of the liver dome CT abdomen/pelvis 04/24/2023-multiple liver lesions poorly defined on noncontrast exam, prominent portacaval and porta hepatis nodes MRI abdomen 05/05/2023-multiple rim-hypoenhancing liver lesions consistent with hepatic metastases, suspicion for a pancreas tail mass Ultrasound-guided biopsy of dominant right liver lesion 05/10/2023-adenocarcinoma, CK7 and CDX2 positive, abundant cytoplasm and mucin with extracellular mucin, foci of lymphovascular invasion Tempus gene panel-K-ras G12V, T P53 mild tumor mutation burden 3.2, MSS PET 06/04/2023-multiple hypermetabolic liver lesions consistent with metastases, low-level FDG uptake in the soft tissue fullness at the tip of the pancreas tail, low-level FDG activity involving a small soft tissue nodule between the gastric fundus and spleen, tiny foci of accumulation at the right costovertebral junction at T10 and roof of the left acetabulum without an underlying CT lesion Evaded CEA and CA 19-9 EUS 06/07/2023 ,22x 15 mm irregular mass in the tail  the pancreas, 11 mm subcarinal lymph node, FNA biopsy of the pancreas mass-suspicious for malignancy Cycle 1 FOLFIRINOX 06/19/2023 Cycle 2 FOLFIRINOX 07/03/2023 Cycle 3 FOLFIRINOX 07/17/2023 Cycle 4 FOLFIRINOX 07/31/2023 Cycle 5 FOLFIRINOX 08/14/2023, oxaliplatin  and Decadron  dose reduced Cycle 6  FOLFIRINOX 08/28/2023 Cycle 7 FOLFIRINOX 09/11/2023 CTs at [facility] 09/16/2023, compared to 04/24/2023-unchanged pancreas tail mass, decrease in attenuation of hepatic metastases, several lesions have decreased in size, no new lesions Cycle 8 FOLFIRINOX 09/25/2023 Cycle 9 FOLFIRINOX 10/09/2023 Cycle 10 FOLFIRINOX 10/22/2023 Cycle 11 FOLFIRINOX 11/05/2023 Cycle 12 FOLFIRINOX 11/20/2023 Cycle 13 FOLFIRINOX 12/04/2023 Cycle 14 FOLFIRINOX 12/19/2023 CT[facility] at [facility] 12/23/2023: Stable to slight decrease in hepatic metastases, no new lesions, stable pancreas tail mass MRI liver at [facility] 12/26/2023: Multifocal nonenhancing hepatic metastases, pancreas tail mass not well-visualized Cycle 15 FOLFIRINOX 01/01/2024, 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  Resolved with Reglan .  Recurrent hiccups following cycle 4 FOLFIRINOX-not relieved with Reglan       Disposition: David [name] has metastatic pancreas cancer.  He has been maintained on systemic therapy with FOLFIRINOX since July 2024.  There has been clinical and radiologic improvement while on FOLFIRINOX.  The CA 19-9 is lower.  Restaging CTs at [facility] last week reveal stable to slightly improved hepatic metastases. We discussed treatment options.  I recommend continuing systemic therapy.  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  We discussed referral to the [facility] for an immunotherapy trial.  He will think about this and let us  know if he wants us  to make a referral.  David [name] will return for an office visit and chemotherapy in 2 weeks.    David Deep, MD  01/01/2024  10:06 AM

## 2024-01-01 NOTE — Patient Instructions (Addendum)
 [facility] - A DEPT OF [place]  Discharge Instructions: The chemotherapy medication bag should finish at 46 hours, 96 hours, or 7 days. For example, if your pump is scheduled for 46 hours and it was put on at 4:00 p.m., it should finish at 2:00 p.m. the day it is scheduled to come off regardless of your appointment time.     Estimated time to finish at 11:45am on Friday 01/03/24.   If the display on your pump reads "Low Volume" and it is beeping, take the batteries out of the pump and come to the cancer center for it to be taken off.   If the pump alarms go off prior to the pump reading "Low Volume" then call [phone] and someone can assist you.  If the plunger comes out and the chemotherapy medication is leaking out, please use your home chemo spill kit to clean up the spill. Do NOT use paper towels or other household products.  If you have problems or questions regarding your pump, please call either [phone] (24 hours a day) or the cancer center Monday-Friday 8:00 a.m.- 4:30 p.m. at the clinic number and we will assist you. If you are unable to get assistance, then go to the nearest Emergency Department and ask the staff to contact the IV team for assistance.   Thank you for choosing [facility] to provide your oncology and hematology care.   If you have a lab appointment with the Cancer Center, please go directly to the Cancer Center and check in at the registration area.   Wear comfortable clothing and clothing appropriate for easy access to any Portacath or PICC line.   We strive to give you quality time with your provider. You may need to reschedule your appointment if you arrive late (15 or more minutes).  Arriving late affects you and other patients whose appointments are after yours.  Also, if you miss three or more appointments without notifying the office, you may be dismissed from the clinic at the provider's discretion.       For prescription refill requests, have your pharmacy contact our office and allow 72 hours for refills to be completed.    Today you received the following chemotherapy and/or immunotherapy agents: irinotecan . Leucovorin , fluorouracil     To help prevent nausea and vomiting after your treatment, we encourage you to take your nausea medication as directed.  BELOW ARE SYMPTOMS THAT SHOULD BE REPORTED IMMEDIATELY: *FEVER GREATER THAN 100.4 F (38 C) OR HIGHER *CHILLS OR SWEATING *NAUSEA AND VOMITING THAT IS NOT CONTROLLED WITH YOUR NAUSEA MEDICATION *UNUSUAL SHORTNESS OF BREATH *UNUSUAL BRUISING OR BLEEDING *URINARY PROBLEMS (pain or burning when urinating, or frequent urination) *BOWEL PROBLEMS (unusual diarrhea, constipation, pain near the anus) TENDERNESS IN MOUTH AND THROAT WITH OR WITHOUT PRESENCE OF ULCERS (sore throat, sores in mouth, or a toothache) UNUSUAL RASH, SWELLING OR PAIN  UNUSUAL VAGINAL DISCHARGE OR ITCHING   Items with * indicate a potential emergency and should be followed up as soon as possible or go to the Emergency Department if any problems should occur.  Please show the CHEMOTHERAPY ALERT CARD or IMMUNOTHERAPY ALERT CARD at check-in to the Emergency Department and triage nurse.  Should you have questions after your visit or need to cancel or reschedule your appointment, please contact [facility]  Dept: [phone]  and follow the prompts.  Office hours are 8:00  a.m. to 4:30 p.m. Monday - Friday. Please note that voicemails left after 4:00 p.m. may not be returned until the following business day.  We are closed weekends and major holidays. You have access to a nurse at all times for urgent questions. Please call the main number to the clinic Dept: [phone] and follow the prompts.   For any non-urgent questions, you may also contact your provider using MyChart. We now offer e-Visits for anyone 74 and  older to request care online for non-urgent symptoms. For details visit [email or web address].   Also download the MyChart app! Go to the app store, search "MyChart", open the app, select [place], and log in with your MyChart username and password.

## 2024-01-01 NOTE — Patient Instructions (Signed)

## 2024-01-01 NOTE — Progress Notes (Signed)
 Patient seen by Dr. Coni Deep today  Vitals are within treatment parameters:No (Please specify and give further instructions.) Pulse 111--OK to proceed Labs are within treatment parameters: No (Please specify and give further instructions.)  Platelets 95--OK to proceed Treatment plan has been signed: Yes   Per physician team, Patient is ready for treatment. Please note the following modifications: Oxaliplatin  has been removed

## 2024-01-02 ENCOUNTER — Other Ambulatory Visit: Payer: Self-pay

## 2024-01-02 LAB — CANCER ANTIGEN 19-9: CA 19-9: 183 U/mL — ABNORMAL HIGH (ref 0–35)

## 2024-01-03 ENCOUNTER — Encounter: Payer: Self-pay | Admitting: Oncology

## 2024-01-03 ENCOUNTER — Inpatient Hospital Stay: Payer: 59

## 2024-01-03 VITALS — BP 135/85 | HR 95 | Temp 98.5°F | Resp 18

## 2024-01-03 DIAGNOSIS — Z5111 Encounter for antineoplastic chemotherapy: Secondary | ICD-10-CM | POA: Diagnosis not present

## 2024-01-03 DIAGNOSIS — C259 Malignant neoplasm of pancreas, unspecified: Secondary | ICD-10-CM

## 2024-01-03 MED ORDER — HEPARIN SOD (PORK) LOCK FLUSH 100 UNIT/ML IV SOLN
500.0000 [IU] | Freq: Once | INTRAVENOUS | Status: AC | PRN
  Administered 2024-01-03: 500 [IU]

## 2024-01-03 MED ORDER — PEGFILGRASTIM-JMDB 6 MG/0.6ML ~~LOC~~ SOSY
6.0000 mg | PREFILLED_SYRINGE | Freq: Once | SUBCUTANEOUS | Status: AC
Start: 2024-01-03 — End: 2024-01-03
  Administered 2024-01-03: 6 mg via SUBCUTANEOUS
  Filled 2024-01-03: qty 0.6

## 2024-01-03 MED ORDER — SODIUM CHLORIDE 0.9% FLUSH
10.0000 mL | INTRAVENOUS | Status: DC | PRN
  Administered 2024-01-03: 10 mL

## 2024-01-03 NOTE — Patient Instructions (Signed)

## 2024-01-06 ENCOUNTER — Encounter: Payer: Self-pay | Admitting: Oncology

## 2024-01-08 ENCOUNTER — Encounter: Payer: Self-pay | Admitting: Oncology

## 2024-01-11 ENCOUNTER — Other Ambulatory Visit: Payer: Self-pay | Admitting: Oncology

## 2024-01-11 DIAGNOSIS — C259 Malignant neoplasm of pancreas, unspecified: Secondary | ICD-10-CM

## 2024-01-15 ENCOUNTER — Inpatient Hospital Stay (HOSPITAL_BASED_OUTPATIENT_CLINIC_OR_DEPARTMENT_OTHER): Payer: 59 | Admitting: Oncology

## 2024-01-15 ENCOUNTER — Encounter: Payer: Self-pay | Admitting: Oncology

## 2024-01-15 ENCOUNTER — Inpatient Hospital Stay: Payer: 59

## 2024-01-15 VITALS — BP 121/76 | HR 80 | Temp 98.1°F | Resp 18 | Ht 68.0 in | Wt 156.8 lb

## 2024-01-15 DIAGNOSIS — C787 Secondary malignant neoplasm of liver and intrahepatic bile duct: Secondary | ICD-10-CM | POA: Diagnosis not present

## 2024-01-15 DIAGNOSIS — C259 Malignant neoplasm of pancreas, unspecified: Secondary | ICD-10-CM

## 2024-01-15 DIAGNOSIS — Z5111 Encounter for antineoplastic chemotherapy: Secondary | ICD-10-CM | POA: Diagnosis not present

## 2024-01-15 LAB — CBC WITH DIFFERENTIAL (CANCER CENTER ONLY)
Abs Immature Granulocytes: 0.05 10*3/uL (ref 0.00–0.07)
Basophils Absolute: 0 10*3/uL (ref 0.0–0.1)
Basophils Relative: 0 %
Eosinophils Absolute: 0.2 10*3/uL (ref 0.0–0.5)
Eosinophils Relative: 4 %
HCT: 32.9 % — ABNORMAL LOW (ref 39.0–52.0)
Hemoglobin: 11.5 g/dL — ABNORMAL LOW (ref 13.0–17.0)
Immature Granulocytes: 1 %
Lymphocytes Relative: 18 %
Lymphs Abs: 0.9 10*3/uL (ref 0.7–4.0)
MCH: 36.7 pg — ABNORMAL HIGH (ref 26.0–34.0)
MCHC: 35 g/dL (ref 30.0–36.0)
MCV: 105.1 fL — ABNORMAL HIGH (ref 80.0–100.0)
Monocytes Absolute: 0.6 10*3/uL (ref 0.1–1.0)
Monocytes Relative: 13 %
Neutro Abs: 3.3 10*3/uL (ref 1.7–7.7)
Neutrophils Relative %: 64 %
Platelet Count: 118 10*3/uL — ABNORMAL LOW (ref 150–400)
RBC: 3.13 MIL/uL — ABNORMAL LOW (ref 4.22–5.81)
RDW: 13.1 % (ref 11.5–15.5)
WBC Count: 5 10*3/uL (ref 4.0–10.5)
nRBC: 0 % (ref 0.0–0.2)

## 2024-01-15 LAB — CMP (CANCER CENTER ONLY)
ALT: 31 U/L (ref 0–44)
AST: 25 U/L (ref 15–41)
Albumin: 3.9 g/dL (ref 3.5–5.0)
Alkaline Phosphatase: 278 U/L — ABNORMAL HIGH (ref 38–126)
Anion gap: 7 (ref 5–15)
BUN: 13 mg/dL (ref 6–20)
CO2: 27 mmol/L (ref 22–32)
Calcium: 9.7 mg/dL (ref 8.9–10.3)
Chloride: 101 mmol/L (ref 98–111)
Creatinine: 0.72 mg/dL (ref 0.61–1.24)
GFR, Estimated: >60 mL/min (ref >60)
Glucose, Bld: 272 mg/dL — ABNORMAL HIGH (ref 70–99)
Potassium: 4.1 mmol/L (ref 3.5–5.1)
Sodium: 135 mmol/L (ref 135–145)
Total Bilirubin: 0.8 mg/dL (ref 0.0–1.2)
Total Protein: 7.3 g/dL (ref 6.5–8.1)

## 2024-01-15 MED ORDER — SODIUM CHLORIDE 0.9 % IV SOLN
2400.0000 mg/m2 | INTRAVENOUS | Status: DC
  Administered 2024-01-15: 4450 mg via INTRAVENOUS
  Filled 2024-01-15: qty 89

## 2024-01-15 MED ORDER — ATROPINE SULFATE 1 MG/ML IV SOLN
0.5000 mg | Freq: Once | INTRAVENOUS | Status: AC | PRN
  Administered 2024-01-15: 0.5 mg via INTRAVENOUS
  Filled 2024-01-15: qty 1

## 2024-01-15 MED ORDER — SODIUM CHLORIDE 0.9 % IV SOLN: Freq: Once | INTRAVENOUS | Status: AC

## 2024-01-15 MED ORDER — SODIUM CHLORIDE 0.9 % IV SOLN
400.0000 mg/m2 | Freq: Once | INTRAVENOUS | Status: AC
  Administered 2024-01-15: 744 mg via INTRAVENOUS
  Filled 2024-01-15: qty 37.2

## 2024-01-15 MED ORDER — SODIUM CHLORIDE 0.9 % IV SOLN
150.0000 mg/m2 | Freq: Once | INTRAVENOUS | Status: AC
  Administered 2024-01-15: 300 mg via INTRAVENOUS
  Filled 2024-01-15: qty 15

## 2024-01-15 MED ORDER — SODIUM CHLORIDE 0.9% FLUSH
10.0000 mL | INTRAVENOUS | Status: DC | PRN
  Administered 2024-01-15: 10 mL

## 2024-01-15 MED ORDER — DEXAMETHASONE SODIUM PHOSPHATE 10 MG/ML IJ SOLN
5.0000 mg | Freq: Once | INTRAMUSCULAR | Status: AC
Start: 2024-01-15 — End: 2024-01-15
  Administered 2024-01-15: 5 mg via INTRAVENOUS
  Filled 2024-01-15: qty 1

## 2024-01-15 MED ORDER — PALONOSETRON HCL INJECTION 0.25 MG/5ML
0.2500 mg | Freq: Once | INTRAVENOUS | Status: AC
  Administered 2024-01-15: 0.25 mg via INTRAVENOUS
  Filled 2024-01-15: qty 5

## 2024-01-15 MED ORDER — SODIUM CHLORIDE 0.9 % IV SOLN
150.0000 mg | Freq: Once | INTRAVENOUS | Status: AC
  Administered 2024-01-15: 150 mg via INTRAVENOUS
  Filled 2024-01-15: qty 150

## 2024-01-15 NOTE — Patient Instructions (Addendum)
[facility]  Discharge Instructions: The chemotherapy medication bag should finish at 46 hours, 96 hours, or 7 days. For example, if your pump is scheduled for 46 hours and it was put on at 4:00 p.m., it should finish at 2:00 p.m. the day it is scheduled to come off regardless of your appointment time.     Estimated time to finish at 11:00am on Friday 01/17/24.   If the display on your pump reads "Low Volume" and it is beeping, take the batteries out of the pump and come to the cancer center for it to be taken off.   If the pump alarms go off prior to the pump reading "Low Volume" then call [phone] and someone can assist you.  If the plunger comes out and the chemotherapy medication is leaking out, please use your home chemo spill kit to clean up the spill. Do NOT use paper towels or other household products.  If you have problems or questions regarding your pump, please call either [phone] (24 hours a day) or the cancer center Monday-Friday 8:00 a.m.- 4:30 p.m. at the clinic number and we will assist you. If you are unable to get assistance, then go to the nearest Emergency Department and ask the staff to contact the IV team for assistance.   Thank you for choosing [facility] to provide your oncology and hematology care.   If you have a lab appointment with the Cancer Center, please go directly to the Cancer Center and check in at the registration area.   Wear comfortable clothing and clothing appropriate for easy access to any Portacath or PICC line.   We strive to give you quality time with your provider. You may need to reschedule your appointment if you arrive late (15 or more minutes).  Arriving late affects you and other patients whose appointments are after yours.  Also, if you miss three or more appointments without notifying the office, you may be dismissed from the clinic at the provider's discretion.       For prescription refill requests, have your pharmacy contact our office and allow 72 hours for refills to be completed.    Today you received the following chemotherapy and/or immunotherapy agents: irinotecan. Leucovorin, fluorouracil    To help prevent nausea and vomiting after your treatment, we encourage you to take your nausea medication as directed.  BELOW ARE SYMPTOMS THAT SHOULD BE REPORTED IMMEDIATELY: *FEVER GREATER THAN 100.4 F (38 C) OR HIGHER *CHILLS OR SWEATING *NAUSEA AND VOMITING THAT IS NOT CONTROLLED WITH YOUR NAUSEA MEDICATION *UNUSUAL SHORTNESS OF BREATH *UNUSUAL BRUISING OR BLEEDING *URINARY PROBLEMS (pain or burning when urinating, or frequent urination) *BOWEL PROBLEMS (unusual diarrhea, constipation, pain near the anus) TENDERNESS IN MOUTH AND THROAT WITH OR WITHOUT PRESENCE OF ULCERS (sore throat, sores in mouth, or a toothache) UNUSUAL RASH, SWELLING OR PAIN  UNUSUAL VAGINAL DISCHARGE OR ITCHING   Items with * indicate a potential emergency and should be followed up as soon as possible or go to the Emergency Department if any problems should occur.  Please show the CHEMOTHERAPY ALERT CARD or IMMUNOTHERAPY ALERT CARD at check-in to the Emergency Department and triage nurse.  Should you have questions after your visit or need to cancel or reschedule your appointment, please contact [facility]  Dept: [phone]  and follow the prompts.  Office hours are 8:00  a.m. to 4:30 p.m. Monday - Friday. Please note that voicemails left after 4:00 p.m. may not be returned until the following business day.  We are closed weekends and major holidays. You have access to a nurse at all times for urgent questions. Please call the main number to the clinic Dept: [phone] and follow the prompts.   For any non-urgent questions, you may also contact your provider using MyChart. We now offer e-Visits for anyone 30 and  older to request care online for non-urgent symptoms. For details visit [email or web address].   Also download the MyChart app! Go to the app store, search "MyChart", open the app, select [place], and log in with your MyChart username and password.

## 2024-01-15 NOTE — Progress Notes (Signed)
[facility] OFFICE PROGRESS NOTE   Diagnosis: Pancreas cancer  INTERVAL HISTORY:   [name] completed another cycle of chemotherapy 01/01/2024.  Oxaliplatin was held from this cycle.  He reports decreased hiccups.  He has persistent numbness in the fingers and toes.  No nausea/vomiting, mouth sores, or diarrhea.  He is working.  Objective:  Vital signs in last 24 hours:  Blood pressure 121/76, pulse 80, temperature 98.1 F (36.7 C), temperature source Temporal, resp. rate 18, height 5\' 8"  (1.727 m), weight 156 lb 12.8 oz (71.1 kg), SpO2 100%.    HEENT: No thrush or ulcers Resp: Lungs clear bilaterally Cardio: Regular rate and rhythm GI: No hepatosplenomegaly Vascular: No leg edema  [place]kin [place]ms without erythema  Portacath/PICC-without erythema  Lab Results:  Lab Results  Component Value Date   WBC 5.0 01/15/2024   HGB 11.5 (L) 01/15/2024   HCT 32.9 (L) 01/15/2024   MCV 105.1 (H) 01/15/2024   PLT 118 (L) 01/15/2024   NEUTROABS 3.3 01/15/2024    CMP  Lab Results  Component Value Date   NA 135 01/15/2024   K 4.1 01/15/2024   CL 101 01/15/2024   CO2 27 01/15/2024   GLUCOSE 272 (H) 01/15/2024   BUN 13 01/15/2024   CREATININE 0.72 01/15/2024   CALCIUM 9.7 01/15/2024   PROT 7.3 01/15/2024   ALBUMIN 3.9 01/15/2024   AST 25 01/15/2024   ALT 31 01/15/2024   ALKPHOS 278 (H) 01/15/2024   BILITOT 0.8 01/15/2024   GFRNONAA >60 01/15/2024   GFRAA >60 03/26/2018    Lab Results  Component Value Date   CEA 772.45 (H) 05/02/2023   WNU272 183 (H) 01/01/2024     Medications: I have reviewed the patient's current medications.   Assessment/Plan: Pancreas cancer CT angiogram chest 04/24/2023-low-density mass in the dome of the liver with at least 3 additional lesions in both hepatic lobes, borderline lymphadenopathy in the hepatoduodenal ligament Right upper quadrant ultrasound 04/24/2023-large circumscribed mass of the liver dome CT abdomen/pelvis  04/24/2023-multiple liver lesions poorly defined on noncontrast exam, prominent portacaval and porta hepatis nodes MRI abdomen 05/05/2023-multiple rim-hypoenhancing liver lesions consistent with hepatic metastases, suspicion for a pancreas tail mass Ultrasound-guided biopsy of dominant right liver lesion 05/10/2023-adenocarcinoma, CK7 and CDX2 positive, abundant cytoplasm and mucin with extracellular mucin, foci of lymphovascular invasion Tempus gene panel-K-ras G12V, T P53 mild tumor mutation burden 3.2, MSS PET 06/04/2023-multiple hypermetabolic liver lesions consistent with metastases, low-level FDG uptake in the soft tissue fullness at the tip of the pancreas tail, low-level FDG activity involving a small soft tissue nodule between the gastric fundus and spleen, tiny foci of accumulation at the right costovertebral junction at T10 and roof of the left acetabulum without an underlying CT lesion Elevated CEA and CA 19-9 EUS 06/07/2023 ,22x 15 mm irregular mass in the tail the pancreas, 11 mm subcarinal lymph node, FNA biopsy of the pancreas mass-suspicious for malignancy Cycle 1 FOLFIRINOX 06/19/2023 Cycle 2 FOLFIRINOX 07/03/2023 Cycle 3 FOLFIRINOX 07/17/2023 Cycle 4 FOLFIRINOX 07/31/2023 Cycle 5 FOLFIRINOX 08/14/2023, oxaliplatin and Decadron dose reduced Cycle 6 FOLFIRINOX 08/28/2023 Cycle 7 FOLFIRINOX 09/11/2023 CTs at [facility] 09/16/2023, compared to 04/24/2023-unchanged pancreas tail mass, decrease in attenuation of hepatic metastases, several lesions have decreased in size, no new lesions Cycle 8 FOLFIRINOX 09/25/2023 Cycle 9 FOLFIRINOX 10/09/2023 Cycle 10 FOLFIRINOX 10/22/2023 Cycle 11 FOLFIRINOX 11/05/2023 Cycle 12 FOLFIRINOX 11/20/2023 Cycle 13 FOLFIRINOX 12/04/2023 Cycle 14 FOLFIRINOX 12/19/2023 CTs at [facility] 12/23/2023: Stable to slight decrease in hepatic metastases, no new  lesions, stable pancreas tail mass MRI liver at [facility] 12/26/2023: Multifocal nonenhancing hepatic metastases, pancreas tail mass not  well-visualized Cycle 15 FOLFIRINOX 01/01/2024, oxaliplatin held secondary to neuropathy Cycle 16 FOLFIRINOX 01/15/2024, 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  Resolved with Reglan.  Recurrent hiccups following cycle 4 FOLFIRINOX-not relieved with Reglan        Disposition: David [name] appears stable.  There is no clinical evidence for disease progression.  He will continue chemotherapy with FOLFIRI.  He will complete another cycle today.  He will return for an office visit and the next cycle chemotherapy on 02/05/2024.  He is considering a referral to the [facility] for a clinical trial consult.  Thornton Papas, MD  01/15/2024  9:35 AM

## 2024-01-15 NOTE — Progress Notes (Signed)
Patient seen by Dr. Thornton Papas today  Vitals are within treatment parameters:Yes   Labs are within treatment parameters: Yes   Treatment plan has been signed: Yes   Per physician team, Patient is ready for treatment and there are NO modifications to the treatment plan.  Oxaliplatin has been permanently removed from care plan

## 2024-01-16 ENCOUNTER — Other Ambulatory Visit: Payer: Self-pay

## 2024-01-16 LAB — CANCER ANTIGEN 19-9: CA 19-9: 159 U/mL — ABNORMAL HIGH (ref 0–35)

## 2024-01-17 ENCOUNTER — Inpatient Hospital Stay: Payer: 59

## 2024-01-17 VITALS — BP 129/79 | HR 76 | Temp 98.6°F | Resp 18

## 2024-01-17 DIAGNOSIS — Z5111 Encounter for antineoplastic chemotherapy: Secondary | ICD-10-CM | POA: Diagnosis not present

## 2024-01-17 DIAGNOSIS — C259 Malignant neoplasm of pancreas, unspecified: Secondary | ICD-10-CM

## 2024-01-17 MED ORDER — HEPARIN SOD (PORK) LOCK FLUSH 100 UNIT/ML IV SOLN
500.0000 [IU] | Freq: Once | INTRAVENOUS | Status: AC | PRN
  Administered 2024-01-17: 500 [IU]

## 2024-01-17 MED ORDER — SODIUM CHLORIDE 0.9% FLUSH
10.0000 mL | INTRAVENOUS | Status: DC | PRN
  Administered 2024-01-17: 10 mL

## 2024-01-17 MED ORDER — PEGFILGRASTIM-JMDB 6 MG/0.6ML ~~LOC~~ SOSY
6.0000 mg | PREFILLED_SYRINGE | Freq: Once | SUBCUTANEOUS | Status: AC
  Administered 2024-01-17: 6 mg via SUBCUTANEOUS
  Filled 2024-01-17: qty 0.6

## 2024-01-21 ENCOUNTER — Encounter: Payer: Self-pay | Admitting: Oncology

## 2024-01-31 ENCOUNTER — Other Ambulatory Visit: Payer: Self-pay | Admitting: Oncology

## 2024-02-01 ENCOUNTER — Other Ambulatory Visit: Payer: Self-pay | Admitting: Oncology

## 2024-02-05 ENCOUNTER — Inpatient Hospital Stay: Payer: 59

## 2024-02-05 ENCOUNTER — Inpatient Hospital Stay (HOSPITAL_BASED_OUTPATIENT_CLINIC_OR_DEPARTMENT_OTHER): Payer: 59 | Admitting: Nurse Practitioner

## 2024-02-05 ENCOUNTER — Inpatient Hospital Stay: Payer: 59 | Attending: Hematology

## 2024-02-05 ENCOUNTER — Encounter: Payer: Self-pay | Admitting: Nurse Practitioner

## 2024-02-05 VITALS — BP 124/78 | HR 69 | Temp 98.2°F | Resp 17

## 2024-02-05 VITALS — BP 127/78 | HR 75 | Temp 98.2°F | Resp 18 | Ht 68.0 in | Wt 157.1 lb

## 2024-02-05 DIAGNOSIS — T451X5A Adverse effect of antineoplastic and immunosuppressive drugs, initial encounter: Secondary | ICD-10-CM | POA: Insufficient documentation

## 2024-02-05 DIAGNOSIS — C787 Secondary malignant neoplasm of liver and intrahepatic bile duct: Secondary | ICD-10-CM | POA: Diagnosis not present

## 2024-02-05 DIAGNOSIS — Z5111 Encounter for antineoplastic chemotherapy: Secondary | ICD-10-CM | POA: Diagnosis present

## 2024-02-05 DIAGNOSIS — D701 Agranulocytosis secondary to cancer chemotherapy: Secondary | ICD-10-CM | POA: Insufficient documentation

## 2024-02-05 DIAGNOSIS — C252 Malignant neoplasm of tail of pancreas: Secondary | ICD-10-CM | POA: Insufficient documentation

## 2024-02-05 DIAGNOSIS — C259 Malignant neoplasm of pancreas, unspecified: Secondary | ICD-10-CM

## 2024-02-05 DIAGNOSIS — G629 Polyneuropathy, unspecified: Secondary | ICD-10-CM | POA: Insufficient documentation

## 2024-02-05 DIAGNOSIS — Z5189 Encounter for other specified aftercare: Secondary | ICD-10-CM | POA: Insufficient documentation

## 2024-02-05 LAB — CMP (CANCER CENTER ONLY)
ALT: 27 U/L (ref 0–44)
AST: 27 U/L (ref 15–41)
Albumin: 4 g/dL (ref 3.5–5.0)
Alkaline Phosphatase: 243 U/L — ABNORMAL HIGH (ref 38–126)
Anion gap: 9 (ref 5–15)
BUN: 16 mg/dL (ref 6–20)
CO2: 24 mmol/L (ref 22–32)
Calcium: 9.7 mg/dL (ref 8.9–10.3)
Chloride: 103 mmol/L (ref 98–111)
Creatinine: 0.83 mg/dL (ref 0.61–1.24)
GFR, Estimated: >60 mL/min (ref >60)
Glucose, Bld: 288 mg/dL — ABNORMAL HIGH (ref 70–99)
Potassium: 4.1 mmol/L (ref 3.5–5.1)
Sodium: 136 mmol/L (ref 135–145)
Total Bilirubin: 0.7 mg/dL (ref 0.0–1.2)
Total Protein: 7.3 g/dL (ref 6.5–8.1)

## 2024-02-05 LAB — CBC WITH DIFFERENTIAL (CANCER CENTER ONLY)
Abs Immature Granulocytes: 0.05 10*3/uL (ref 0.00–0.07)
Basophils Absolute: 0 10*3/uL (ref 0.0–0.1)
Basophils Relative: 1 %
Eosinophils Absolute: 0.1 10*3/uL (ref 0.0–0.5)
Eosinophils Relative: 4 %
HCT: 36.4 % — ABNORMAL LOW (ref 39.0–52.0)
Hemoglobin: 12.5 g/dL — ABNORMAL LOW (ref 13.0–17.0)
Immature Granulocytes: 1 %
Lymphocytes Relative: 29 %
Lymphs Abs: 1.1 10*3/uL (ref 0.7–4.0)
MCH: 36 pg — ABNORMAL HIGH (ref 26.0–34.0)
MCHC: 34.3 g/dL (ref 30.0–36.0)
MCV: 104.9 fL — ABNORMAL HIGH (ref 80.0–100.0)
Monocytes Absolute: 0.6 10*3/uL (ref 0.1–1.0)
Monocytes Relative: 17 %
Neutro Abs: 1.8 10*3/uL (ref 1.7–7.7)
Neutrophils Relative %: 48 %
Platelet Count: 143 10*3/uL — ABNORMAL LOW (ref 150–400)
RBC: 3.47 MIL/uL — ABNORMAL LOW (ref 4.22–5.81)
RDW: 12.3 % (ref 11.5–15.5)
WBC Count: 3.7 10*3/uL — ABNORMAL LOW (ref 4.0–10.5)
nRBC: 0 % (ref 0.0–0.2)

## 2024-02-05 MED ORDER — DEXTROSE 5 % IV SOLN
Freq: Once | INTRAVENOUS | Status: DC
Start: 2024-02-05 — End: 2024-02-05

## 2024-02-05 MED ORDER — SODIUM CHLORIDE 0.9 % IV SOLN
400.0000 mg/m2 | Freq: Once | INTRAVENOUS | Status: AC
  Administered 2024-02-05: 744 mg via INTRAVENOUS
  Filled 2024-02-05: qty 37.2

## 2024-02-05 MED ORDER — ATROPINE SULFATE 1 MG/ML IV SOLN
0.5000 mg | Freq: Once | INTRAVENOUS | Status: AC | PRN
  Administered 2024-02-05: 0.5 mg via INTRAVENOUS
  Filled 2024-02-05: qty 1

## 2024-02-05 MED ORDER — SODIUM CHLORIDE 0.9 % IV SOLN: INTRAVENOUS | Status: DC

## 2024-02-05 MED ORDER — SODIUM CHLORIDE 0.9 % IV SOLN
150.0000 mg | Freq: Once | INTRAVENOUS | Status: AC
  Administered 2024-02-05: 150 mg via INTRAVENOUS
  Filled 2024-02-05: qty 150

## 2024-02-05 MED ORDER — SODIUM CHLORIDE 0.9 % IV SOLN
400.0000 mg/m2 | Freq: Once | INTRAVENOUS | Status: DC
  Filled 2024-02-05: qty 37.2

## 2024-02-05 MED ORDER — IRINOTECAN HCL CHEMO INJECTION 100 MG/5ML
150.0000 mg/m2 | Freq: Once | INTRAVENOUS | Status: AC
  Administered 2024-02-05: 300 mg via INTRAVENOUS
  Filled 2024-02-05: qty 15

## 2024-02-05 MED ORDER — SODIUM CHLORIDE 0.9 % IV SOLN
2400.0000 mg/m2 | INTRAVENOUS | Status: DC
  Administered 2024-02-05: 4450 mg via INTRAVENOUS
  Filled 2024-02-05: qty 89

## 2024-02-05 MED ORDER — PALONOSETRON HCL INJECTION 0.25 MG/5ML
0.2500 mg | Freq: Once | INTRAVENOUS | Status: AC
  Administered 2024-02-05: 0.25 mg via INTRAVENOUS
  Filled 2024-02-05: qty 5

## 2024-02-05 MED ORDER — DEXAMETHASONE SODIUM PHOSPHATE 10 MG/ML IJ SOLN
5.0000 mg | Freq: Once | INTRAMUSCULAR | Status: AC
  Administered 2024-02-05: 5 mg via INTRAVENOUS
  Filled 2024-02-05: qty 1

## 2024-02-05 NOTE — Progress Notes (Signed)
[facility] OFFICE PROGRESS NOTE   Diagnosis: Pancreas cancer  INTERVAL HISTORY:   David [name] returns as scheduled.  He completed another cycle of chemotherapy 01/15/2024.  Oxaliplatin was held due to neuropathy.  He denies nausea/vomiting.  No mouth sores.  No diarrhea.  He had hiccups for about 3 nights after treatment.  He has stable persistent numbness/tingling in the fingertips and toes.  Symptoms do not interfere with activity.  Objective:  Vital signs in last 24 hours:  Blood pressure 127/78, pulse 75, temperature 98.2 F (36.8 C), temperature source Temporal, resp. rate 18, height 5\' 8"  (1.727 m), weight 157 lb 1.6 oz (71.3 kg), SpO2 100%.    HEENT: No thrush or ulcers. Resp: Lungs clear bilaterally. Cardio: Regular rate and rhythm. GI: Abdomen soft and nontender.  No hepatosplenomegaly. Vascular: No leg edema. Skin: No rash.  Palms without erythema. Port-A-Cath without erythema.  Lab Results:  Lab Results  Component Value Date   WBC 3.7 (L) 02/05/2024   HGB 12.5 (L) 02/05/2024   HCT 36.4 (L) 02/05/2024   MCV 104.9 (H) 02/05/2024   PLT 143 (L) 02/05/2024   NEUTROABS 1.8 02/05/2024    Imaging:  No results found.  Medications: I have reviewed the patient's current medications.  Assessment/Plan: Pancreas cancer CT angiogram chest 04/24/2023-low-density mass in the dome of the liver with at least 3 additional lesions in both hepatic lobes, borderline lymphadenopathy in the hepatoduodenal ligament Right upper quadrant ultrasound 04/24/2023-large circumscribed mass of the liver dome CT abdomen/pelvis 04/24/2023-multiple liver lesions poorly defined on noncontrast exam, prominent portacaval and porta hepatis nodes MRI abdomen 05/05/2023-multiple rim-hypoenhancing liver lesions consistent with hepatic metastases, suspicion for a pancreas tail mass Ultrasound-guided biopsy of dominant right liver lesion 05/10/2023-adenocarcinoma, CK7 and CDX2 positive, abundant  cytoplasm and mucin with extracellular mucin, foci of lymphovascular invasion Tempus gene panel-K-ras G12V, T P53 mild tumor mutation burden 3.2, MSS PET 06/04/2023-multiple hypermetabolic liver lesions consistent with metastases, low-level FDG uptake in the soft tissue fullness at the tip of the pancreas tail, low-level FDG activity involving a small soft tissue nodule between the gastric fundus and spleen, tiny foci of accumulation at the right costovertebral junction at T10 and roof of the left acetabulum without an underlying CT lesion Elevated CEA and CA 19-9 EUS 06/07/2023 ,22x 15 mm irregular mass in the tail the pancreas, 11 mm subcarinal lymph node, FNA biopsy of the pancreas mass-suspicious for malignancy Cycle 1 FOLFIRINOX 06/19/2023 Cycle 2 FOLFIRINOX 07/03/2023 Cycle 3 FOLFIRINOX 07/17/2023 Cycle 4 FOLFIRINOX 07/31/2023 Cycle 5 FOLFIRINOX 08/14/2023, oxaliplatin and Decadron dose reduced Cycle 6 FOLFIRINOX 08/28/2023 Cycle 7 FOLFIRINOX 09/11/2023 CTs at [facility] 09/16/2023, compared to 04/24/2023-unchanged pancreas tail mass, decrease in attenuation of hepatic metastases, several lesions have decreased in size, no new lesions Cycle 8 FOLFIRINOX 09/25/2023 Cycle 9 FOLFIRINOX 10/09/2023 Cycle 10 FOLFIRINOX 10/22/2023 Cycle 11 FOLFIRINOX 11/05/2023 Cycle 12 FOLFIRINOX 11/20/2023 Cycle 13 FOLFIRINOX 12/04/2023 Cycle 14 FOLFIRINOX 12/19/2023 CTs at [facility] 12/23/2023: Stable to slight decrease in hepatic metastases, no new lesions, stable pancreas tail mass MRI liver at [facility] 12/26/2023: Multifocal nonenhancing hepatic metastases, pancreas tail mass not well-visualized Cycle 15 FOLFIRINOX 01/01/2024, oxaliplatin held secondary to neuropathy Cycle 16 FOLFIRINOX 01/15/2024, oxaliplatin held secondary to neuropathy Cycle 17 FOLFIRINOX 02/05/2024, oxaliplatin held secondary to neuropathy Family history of pancreas cancerINVITAE panel 05/17/2023-NF1 VUS Severe hiccups following cycle 1 FOLFIRINOX, did not respond to  baclofen or gabapentin.  Resolved with Reglan.  Recurrent hiccups following cycle 4 FOLFIRINOX-not relieved with Reglan  Disposition: David [name] appears stable.  He has completed 16 cycles of FOLFIRINOX.  Oxaliplatin placed on hold beginning with cycle 15 due to neuropathy.  He has persistent neuropathy symptoms.  Plan to proceed with treatment today as scheduled, hold oxaliplatin.  He agrees with this plan.  CBC and chemistry panel reviewed.  Labs adequate for treatment.  He receives white cell growth factor support on day of pump discontinuation.  He has mild leukopenia and mild neutropenia.  He understands to contact the office with fever, chills, other signs of infection, bleeding.  He will return for follow-up and treatment in 2 weeks.  We are available to see him sooner if needed.    Lonna Cobb ANP/GNP-BC   02/05/2024  8:44 AM

## 2024-02-05 NOTE — Patient Instructions (Addendum)
[facility] OF [facility]   Discharge Instructions: The chemotherapy medication bag should finish at 46 hours, 96 hours, or 7 days. For example, if your pump is scheduled for 46 hours and it was put on at 4:00 p.m., it should finish at 2:00 p.m. the day it is scheduled to come off regardless of your appointment time.     Estimated time to finish at 10:45 FRIDAY, February 07, 2024.   If the display on your pump reads "Low Volume" and it is beeping, take the batteries out of the pump and come to the cancer center for it to be taken off.   If the pump alarms go off prior to the pump reading "Low Volume" then call [phone] and someone can assist you.  If the plunger comes out and the chemotherapy medication is leaking out, please use your home chemo spill kit to clean up the spill. Do NOT use paper towels or other household products.  If you have problems or questions regarding your pump, please call either [phone] (24 hours a day) or the cancer center Monday-Friday 8:00 a.m.- 4:30 p.m. at the clinic number and we will assist you. If you are unable to get assistance, then go to the nearest Emergency Department and ask the staff to contact the IV team for assistance.   Thank you for choosing [facility] to provide your oncology and hematology care.   If you have a lab appointment with the Cancer Center, please go directly to the Cancer Center and check in at the registration area.   Wear comfortable clothing and clothing appropriate for easy access to any Portacath or PICC line.   We strive to give you quality time with your provider. You may need to reschedule your appointment if you arrive late (15 or more minutes).  Arriving late affects you and other patients whose appointments are after yours.  Also, if you miss three or more appointments without notifying the office, you may be dismissed from the clinic at the provider's  discretion.      For prescription refill requests, have your pharmacy contact our office and allow 72 hours for refills to be completed.    Today you received the following chemotherapy and/or immunotherapy agents IRINOTECAN/LEUCOVORIN/FLUOROURACIL      To help prevent nausea and vomiting after your treatment, we encourage you to take your nausea medication as directed.  BELOW ARE SYMPTOMS THAT SHOULD BE REPORTED IMMEDIATELY: *FEVER GREATER THAN 100.4 F (38 C) OR HIGHER *CHILLS OR SWEATING *NAUSEA AND VOMITING THAT IS NOT CONTROLLED WITH YOUR NAUSEA MEDICATION *UNUSUAL SHORTNESS OF BREATH *UNUSUAL BRUISING OR BLEEDING *URINARY PROBLEMS (pain or burning when urinating, or frequent urination) *BOWEL PROBLEMS (unusual diarrhea, constipation, pain near the anus) TENDERNESS IN MOUTH AND THROAT WITH OR WITHOUT PRESENCE OF ULCERS (sore throat, sores in mouth, or a toothache) UNUSUAL RASH, SWELLING OR PAIN  UNUSUAL VAGINAL DISCHARGE OR ITCHING   Items with * indicate a potential emergency and should be followed up as soon as possible or go to the Emergency Department if any problems should occur.  Please show the CHEMOTHERAPY ALERT CARD or IMMUNOTHERAPY ALERT CARD at check-in to the Emergency Department and triage nurse.  Should you have questions after your visit or need to cancel or reschedule your appointment, please contact [facility]  Dept: [phone]  and follow the prompts.  Office hours  are 8:00 a.m. to 4:30 p.m. Monday - Friday. Please note that voicemails left after 4:00 p.m. may not be returned until the following business day.  We are closed weekends and major holidays. You have access to a nurse at all times for urgent questions. Please call the main number to the clinic Dept: [phone] and follow the prompts.   For any non-urgent questions, you may also contact your provider using MyChart. We now offer e-Visits for  anyone 35 and older to request care online for non-urgent symptoms. For details visit [email or web address].   Also download the MyChart app! Go to the app store, search "MyChart", open the app, select [place], and log in with your MyChart username and password.  Irinotecan Injection What is this medication? IRINOTECAN (ir in oh TEE kan) treats some types of cancer. It works by slowing down the growth of cancer cells. This medicine may be used for other purposes; ask your health care provider or pharmacist if you have questions. COMMON BRAND NAME(S): Camptosar What should I tell my care team before I take this medication? They need to know if you have any of these conditions: Dehydration Diarrhea Infection, especially a viral infection, such as chickenpox, cold sores, herpes Liver disease Low blood cell levels (white cells, red cells, and platelets) Low levels of electrolytes, such as calcium, magnesium, or potassium in your blood Recent or ongoing radiation An unusual or allergic reaction to irinotecan, other medications, foods, dyes, or preservatives If you or your partner are pregnant or trying to get pregnant Breast-feeding How should I use this medication? This medication is injected into a vein. It is given by your care team in a hospital or clinic setting. Talk to your care team about the use of this medication in children. Special care may be needed. Overdosage: If you think you have taken too much of this medicine contact a poison control center or emergency room at once. NOTE: This medicine is only for you. Do not share this medicine with others. What if I miss a dose? Keep appointments for follow-up doses. It is important not to miss your dose. Call your care team if you are unable to keep an appointment. What may interact with this medication? Do not take this medication with any of the following: Cobicistat Itraconazole This medication may also interact with the  following: Certain antibiotics, such as clarithromycin, rifampin, rifabutin Certain antivirals for HIV or AIDS Certain medications for fungal infections, such as ketoconazole, posaconazole, voriconazole Certain medications for seizures, such as carbamazepine, phenobarbital, phenytoin Gemfibrozil Nefazodone St. John's wort This list may not describe all possible interactions. Give your health care provider a list of all the medicines, herbs, non-prescription drugs, or dietary supplements you use. Also tell them if you smoke, drink alcohol, or use illegal drugs. Some items may interact with your medicine. What should I watch for while using this medication? Your condition will be monitored carefully while you are receiving this medication. You may need blood work while taking this medication. This medication may make you feel generally unwell. This is not uncommon as chemotherapy can affect healthy cells as well as cancer cells. Report any side effects. Continue your course of treatment even though you feel ill unless your care team tells you to stop. This medication can cause serious side effects. To reduce the risk, your care team may give you other medications to take before receiving this one. Be sure to follow the directions from your care team. This medication may  affect your coordination, reaction time, or judgement. Do not drive or operate machinery until you know how this medication affects you. Sit up or stand slowly to reduce the risk of dizzy or fainting spells. Drinking alcohol with this medication can increase the risk of these side effects. This medication may increase your risk of getting an infection. Call your care team for advice if you get a fever, chills, sore throat, or other symptoms of a cold or flu. Do not treat yourself. Try to avoid being around people who are sick. Avoid taking medications that contain aspirin, acetaminophen, ibuprofen, naproxen, or ketoprofen unless  instructed by your care team. These medications may hide a fever. This medication may increase your risk to bruise or bleed. Call your care team if you notice any unusual bleeding. Be careful brushing or flossing your teeth or using a toothpick because you may get an infection or bleed more easily. If you have any dental work done, tell your dentist you are receiving this medication. Talk to your care team if you or your partner are pregnant or think either of you might be pregnant. This medication can cause serious birth defects if taken during pregnancy and for 6 months after the last dose. You will need a negative pregnancy test before starting this medication. Contraception is recommended while taking this medication and for 6 months after the last dose. Your care team can help you find the option that works for you. Do not father a child while taking this medication and for 3 months after the last dose. Use a condom for contraception during this time period. Do not breastfeed while taking this medication and for 7 days after the last dose. This medication may cause infertility. Talk to your care team if you are concerned about your fertility. What side effects may I notice from receiving this medication? Side effects that you should report to your care team as soon as possible: Allergic reactions--skin rash, itching, hives, swelling of the face, lips, tongue, or throat Dry cough, shortness of breath or trouble breathing Increased saliva or tears, increased sweating, stomach cramping, diarrhea, small pupils, unusual weakness or fatigue, slow heartbeat Infection--fever, chills, cough, sore throat, wounds that don't heal, pain or trouble when passing urine, general feeling of discomfort or being unwell Kidney injury--decrease in the amount of urine, swelling of the ankles, hands, or feet Low red blood cell level--unusual weakness or fatigue, dizziness, headache, trouble breathing Severe or prolonged  diarrhea Unusual bruising or bleeding Side effects that usually do not require medical attention (report to your care team if they continue or are bothersome): Constipation Diarrhea Hair loss Loss of appetite Nausea Stomach pain This list may not describe all possible side effects. Call your doctor for medical advice about side effects. You may report side effects to FDA at 1-800-FDA-1088. Where should I keep my medication? This medication is given in a hospital or clinic. It will not be stored at home. NOTE: This sheet is a summary. It may not cover all possible information. If you have questions about this medicine, talk to your doctor, pharmacist, or health care provider.  2024 Elsevier/Gold Standard (2022-04-16 00:00:00)  Leucovorin Injection What is this medication? LEUCOVORIN (loo koe VOR in) prevents side effects from certain medications, such as methotrexate. It works by increasing folate levels. This helps protect healthy cells in your body. It may also be used to treat anemia caused by low levels of folate. It can also be used with fluorouracil, a type  of chemotherapy, to treat colorectal cancer. It works by increasing the effects of fluorouracil in the body. This medicine may be used for other purposes; ask your health care provider or pharmacist if you have questions. What should I tell my care team before I take this medication? They need to know if you have any of these conditions: Anemia from low levels of vitamin B12 in the blood An unusual or allergic reaction to leucovorin, folic acid, other medications, foods, dyes, or preservatives Pregnant or trying to get pregnant Breastfeeding How should I use this medication? This medication is injected into a vein or a muscle. It is given by your care team in a hospital or clinic setting. Talk to your care team about the use of this medication in children. Special care may be needed. Overdosage: If you think you have taken too  much of this medicine contact a poison control center or emergency room at once. NOTE: This medicine is only for you. Do not share this medicine with others. What if I miss a dose? Keep appointments for follow-up doses. It is important not to miss your dose. Call your care team if you are unable to keep an appointment. What may interact with this medication? Capecitabine Fluorouracil Phenobarbital Phenytoin Primidone Trimethoprim;sulfamethoxazole This list may not describe all possible interactions. Give your health care provider a list of all the medicines, herbs, non-prescription drugs, or dietary supplements you use. Also tell them if you smoke, drink alcohol, or use illegal drugs. Some items may interact with your medicine. What should I watch for while using this medication? Your condition will be monitored carefully while you are receiving this medication. This medication may increase the side effects of 5-fluorouracil. Tell your care team if you have diarrhea or mouth sores that do not get better or that get worse. What side effects may I notice from receiving this medication? Side effects that you should report to your care team as soon as possible: Allergic reactions--skin rash, itching, hives, swelling of the face, lips, tongue, or throat This list may not describe all possible side effects. Call your doctor for medical advice about side effects. You may report side effects to FDA at 1-800-FDA-1088. Where should I keep my medication? This medication is given in a hospital or clinic. It will not be stored at home. NOTE: This sheet is a summary. It may not cover all possible information. If you have questions about this medicine, talk to your doctor, pharmacist, or health care provider.  2024 Elsevier/Gold Standard (2022-05-08 00:00:00)  Fluorouracil Injection What is this medication? FLUOROURACIL (flure oh YOOR a sil) treats some types of cancer. It works by slowing down the  growth of cancer cells. This medicine may be used for other purposes; ask your health care provider or pharmacist if you have questions. COMMON BRAND NAME(S): Adrucil What should I tell my care team before I take this medication? They need to know if you have any of these conditions: Blood disorders Dihydropyrimidine dehydrogenase (DPD) deficiency Infection, such as chickenpox, cold sores, herpes Kidney disease Liver disease Poor nutrition Recent or ongoing radiation therapy An unusual or allergic reaction to fluorouracil, other medications, foods, dyes, or preservatives If you or your partner are pregnant or trying to get pregnant Breast-feeding How should I use this medication? This medication is injected into a vein. It is administered by your care team in a hospital or clinic setting. Talk to your care team about the use of this medication in children. Special  care may be needed. Overdosage: If you think you have taken too much of this medicine contact a poison control center or emergency room at once. NOTE: This medicine is only for you. Do not share this medicine with others. What if I miss a dose? Keep appointments for follow-up doses. It is important not to miss your dose. Call your care team if you are unable to keep an appointment. What may interact with this medication? Do not take this medication with any of the following: Live virus vaccines This medication may also interact with the following: Medications that treat or prevent blood clots, such as warfarin, enoxaparin, dalteparin This list may not describe all possible interactions. Give your health care provider a list of all the medicines, herbs, non-prescription drugs, or dietary supplements you use. Also tell them if you smoke, drink alcohol, or use illegal drugs. Some items may interact with your medicine. What should I watch for while using this medication? Your condition will be monitored carefully while you are  receiving this medication. This medication may make you feel generally unwell. This is not uncommon as chemotherapy can affect healthy cells as well as cancer cells. Report any side effects. Continue your course of treatment even though you feel ill unless your care team tells you to stop. In some cases, you may be given additional medications to help with side effects. Follow all directions for their use. This medication may increase your risk of getting an infection. Call your care team for advice if you get a fever, chills, sore throat, or other symptoms of a cold or flu. Do not treat yourself. Try to avoid being around people who are sick. This medication may increase your risk to bruise or bleed. Call your care team if you notice any unusual bleeding. Be careful brushing or flossing your teeth or using a toothpick because you may get an infection or bleed more easily. If you have any dental work done, tell your dentist you are receiving this medication. Avoid taking medications that contain aspirin, acetaminophen, ibuprofen, naproxen, or ketoprofen unless instructed by your care team. These medications may hide a fever. Do not treat diarrhea with over the counter products. Contact your care team if you have diarrhea that lasts more than 2 days or if it is severe and watery. This medication can make you more sensitive to the sun. Keep out of the sun. If you cannot avoid being in the sun, wear protective clothing and sunscreen. Do not use sun lamps, tanning beds, or tanning booths. Talk to your care team if you or your partner wish to become pregnant or think you might be pregnant. This medication can cause serious birth defects if taken during pregnancy and for 3 months after the last dose. A reliable form of contraception is recommended while taking this medication and for 3 months after the last dose. Talk to your care team about effective forms of contraception. Do not father a child while taking  this medication and for 3 months after the last dose. Use a condom while having sex during this time period. Do not breastfeed while taking this medication. This medication may cause infertility. Talk to your care team if you are concerned about your fertility. What side effects may I notice from receiving this medication? Side effects that you should report to your care team as soon as possible: Allergic reactions--skin rash, itching, hives, swelling of the face, lips, tongue, or throat Heart attack--pain or tightness in the chest, shoulders,  arms, or jaw, nausea, shortness of breath, cold or clammy skin, feeling faint or lightheaded Heart failure--shortness of breath, swelling of the ankles, feet, or hands, sudden weight gain, unusual weakness or fatigue Heart rhythm changes--fast or irregular heartbeat, dizziness, feeling faint or lightheaded, chest pain, trouble breathing High ammonia level--unusual weakness or fatigue, confusion, loss of appetite, nausea, vomiting, seizures Infection--fever, chills, cough, sore throat, wounds that don't heal, pain or trouble when passing urine, general feeling of discomfort or being unwell Low red blood cell level--unusual weakness or fatigue, dizziness, headache, trouble breathing Pain, tingling, or numbness in the hands or feet, muscle weakness, change in vision, confusion or trouble speaking, loss of balance or coordination, trouble walking, seizures Redness, swelling, and blistering of the skin over hands and feet Severe or prolonged diarrhea Unusual bruising or bleeding Side effects that usually do not require medical attention (report to your care team if they continue or are bothersome): Dry skin Headache Increased tears Nausea Pain, redness, or swelling with sores inside the mouth or throat Sensitivity to light Vomiting This list may not describe all possible side effects. Call your doctor for medical advice about side effects. You may report  side effects to FDA at 1-800-FDA-1088. Where should I keep my medication? This medication is given in a hospital or clinic. It will not be stored at home. NOTE: This sheet is a summary. It may not cover all possible information. If you have questions about this medicine, talk to your doctor, pharmacist, or health care provider.  2024 Elsevier/Gold Standard (2022-04-10 00:00:00)

## 2024-02-05 NOTE — Progress Notes (Addendum)
Patient seen by Lonna Cobb NP today  Vitals are within treatment parameters:Yes   Labs are within treatment parameters: Yes Alkaline Phosphatase 243  Treatment plan has been signed: Yes   Per physician team, Patient is ready for treatment. Please note the following modifications:  holding oxaliplatin

## 2024-02-06 LAB — CANCER ANTIGEN 19-9: CA 19-9: 127 U/mL — ABNORMAL HIGH (ref 0–35)

## 2024-02-07 ENCOUNTER — Other Ambulatory Visit: Payer: Self-pay

## 2024-02-07 ENCOUNTER — Inpatient Hospital Stay: Payer: 59

## 2024-02-07 VITALS — BP 132/72 | HR 71 | Temp 98.1°F | Resp 18

## 2024-02-07 DIAGNOSIS — C259 Malignant neoplasm of pancreas, unspecified: Secondary | ICD-10-CM

## 2024-02-07 DIAGNOSIS — Z5111 Encounter for antineoplastic chemotherapy: Secondary | ICD-10-CM | POA: Diagnosis not present

## 2024-02-07 MED ORDER — PEGFILGRASTIM-JMDB 6 MG/0.6ML ~~LOC~~ SOSY
6.0000 mg | PREFILLED_SYRINGE | Freq: Once | SUBCUTANEOUS | Status: AC
  Administered 2024-02-07: 6 mg via SUBCUTANEOUS
  Filled 2024-02-07: qty 0.6

## 2024-02-07 MED ORDER — HEPARIN SOD (PORK) LOCK FLUSH 100 UNIT/ML IV SOLN
500.0000 [IU] | Freq: Once | INTRAVENOUS | Status: AC | PRN
  Administered 2024-02-07: 500 [IU]

## 2024-02-07 MED ORDER — SODIUM CHLORIDE 0.9% FLUSH
10.0000 mL | INTRAVENOUS | Status: DC | PRN
  Administered 2024-02-07: 10 mL

## 2024-02-07 NOTE — Patient Instructions (Signed)

## 2024-02-16 ENCOUNTER — Other Ambulatory Visit: Payer: Self-pay | Admitting: Oncology

## 2024-02-19 ENCOUNTER — Inpatient Hospital Stay (HOSPITAL_BASED_OUTPATIENT_CLINIC_OR_DEPARTMENT_OTHER): Payer: No Typology Code available for payment source | Admitting: Oncology

## 2024-02-19 ENCOUNTER — Encounter: Payer: Self-pay | Admitting: Oncology

## 2024-02-19 ENCOUNTER — Inpatient Hospital Stay: Payer: No Typology Code available for payment source

## 2024-02-19 ENCOUNTER — Inpatient Hospital Stay: Payer: No Typology Code available for payment source | Attending: Hematology

## 2024-02-19 VITALS — BP 121/76 | HR 63

## 2024-02-19 VITALS — BP 117/70 | HR 77 | Temp 98.2°F | Resp 18 | Ht 68.0 in | Wt 160.4 lb

## 2024-02-19 DIAGNOSIS — C787 Secondary malignant neoplasm of liver and intrahepatic bile duct: Secondary | ICD-10-CM | POA: Diagnosis present

## 2024-02-19 DIAGNOSIS — C259 Malignant neoplasm of pancreas, unspecified: Secondary | ICD-10-CM

## 2024-02-19 DIAGNOSIS — G629 Polyneuropathy, unspecified: Secondary | ICD-10-CM | POA: Insufficient documentation

## 2024-02-19 DIAGNOSIS — C252 Malignant neoplasm of tail of pancreas: Secondary | ICD-10-CM | POA: Diagnosis present

## 2024-02-19 DIAGNOSIS — Z5111 Encounter for antineoplastic chemotherapy: Secondary | ICD-10-CM | POA: Diagnosis present

## 2024-02-19 DIAGNOSIS — R197 Diarrhea, unspecified: Secondary | ICD-10-CM | POA: Insufficient documentation

## 2024-02-19 DIAGNOSIS — Z5189 Encounter for other specified aftercare: Secondary | ICD-10-CM | POA: Diagnosis not present

## 2024-02-19 LAB — CBC WITH DIFFERENTIAL (CANCER CENTER ONLY)
Abs Immature Granulocytes: 0.1 10*3/uL — ABNORMAL HIGH (ref 0.00–0.07)
Basophils Absolute: 0 10*3/uL (ref 0.0–0.1)
Basophils Relative: 1 %
Eosinophils Absolute: 0.1 10*3/uL (ref 0.0–0.5)
Eosinophils Relative: 2 %
HCT: 34.9 % — ABNORMAL LOW (ref 39.0–52.0)
Hemoglobin: 11.8 g/dL — ABNORMAL LOW (ref 13.0–17.0)
Immature Granulocytes: 2 %
Lymphocytes Relative: 16 %
Lymphs Abs: 0.9 10*3/uL (ref 0.7–4.0)
MCH: 35.1 pg — ABNORMAL HIGH (ref 26.0–34.0)
MCHC: 33.8 g/dL (ref 30.0–36.0)
MCV: 103.9 fL — ABNORMAL HIGH (ref 80.0–100.0)
Monocytes Absolute: 0.6 10*3/uL (ref 0.1–1.0)
Monocytes Relative: 11 %
Neutro Abs: 3.9 10*3/uL (ref 1.7–7.7)
Neutrophils Relative %: 68 %
Platelet Count: 142 10*3/uL — ABNORMAL LOW (ref 150–400)
RBC: 3.36 MIL/uL — ABNORMAL LOW (ref 4.22–5.81)
RDW: 12 % (ref 11.5–15.5)
WBC Count: 5.7 10*3/uL (ref 4.0–10.5)
nRBC: 0 % (ref 0.0–0.2)

## 2024-02-19 LAB — CMP (CANCER CENTER ONLY)
ALT: 29 U/L (ref 0–44)
AST: 24 U/L (ref 15–41)
Albumin: 4.3 g/dL (ref 3.5–5.0)
Alkaline Phosphatase: 272 U/L — ABNORMAL HIGH (ref 38–126)
Anion gap: 11 (ref 5–15)
BUN: 16 mg/dL (ref 6–20)
CO2: 24 mmol/L (ref 22–32)
Calcium: 9.3 mg/dL (ref 8.9–10.3)
Chloride: 101 mmol/L (ref 98–111)
Creatinine: 0.79 mg/dL (ref 0.61–1.24)
GFR, Estimated: >60 mL/min (ref >60)
Glucose, Bld: 264 mg/dL — ABNORMAL HIGH (ref 70–99)
Potassium: 4.1 mmol/L (ref 3.5–5.1)
Sodium: 136 mmol/L (ref 135–145)
Total Bilirubin: 0.6 mg/dL (ref 0.0–1.2)
Total Protein: 7.1 g/dL (ref 6.5–8.1)

## 2024-02-19 MED ORDER — ATROPINE SULFATE 1 MG/ML IV SOLN
0.5000 mg | Freq: Once | INTRAVENOUS | Status: AC | PRN
  Administered 2024-02-19: 0.5 mg via INTRAVENOUS
  Filled 2024-02-19: qty 1

## 2024-02-19 MED ORDER — SODIUM CHLORIDE 0.9 % IV SOLN
2400.0000 mg/m2 | INTRAVENOUS | Status: DC
  Administered 2024-02-19: 4450 mg via INTRAVENOUS
  Filled 2024-02-19: qty 89

## 2024-02-19 MED ORDER — SODIUM CHLORIDE 0.9 % IV SOLN
150.0000 mg | Freq: Once | INTRAVENOUS | Status: AC
  Administered 2024-02-19: 150 mg via INTRAVENOUS
  Filled 2024-02-19: qty 150

## 2024-02-19 MED ORDER — SODIUM CHLORIDE 0.9 % IV SOLN
INTRAVENOUS | Status: DC
Start: 2024-02-19 — End: 2024-02-19

## 2024-02-19 MED ORDER — SODIUM CHLORIDE 0.9 % IV SOLN
150.0000 mg/m2 | Freq: Once | INTRAVENOUS | Status: AC
  Administered 2024-02-19: 300 mg via INTRAVENOUS
  Filled 2024-02-19: qty 15

## 2024-02-19 MED ORDER — SODIUM CHLORIDE 0.9 % IV SOLN
400.0000 mg/m2 | Freq: Once | INTRAVENOUS | Status: AC
  Administered 2024-02-19: 744 mg via INTRAVENOUS
  Filled 2024-02-19: qty 37.2

## 2024-02-19 MED ORDER — DEXAMETHASONE SODIUM PHOSPHATE 10 MG/ML IJ SOLN
5.0000 mg | Freq: Once | INTRAMUSCULAR | Status: AC
  Administered 2024-02-19: 5 mg via INTRAVENOUS
  Filled 2024-02-19: qty 1

## 2024-02-19 MED ORDER — PALONOSETRON HCL INJECTION 0.25 MG/5ML
0.2500 mg | Freq: Once | INTRAVENOUS | Status: AC
  Administered 2024-02-19: 0.25 mg via INTRAVENOUS
  Filled 2024-02-19: qty 5

## 2024-02-19 NOTE — Patient Instructions (Addendum)
 [facility]   Discharge Instructions: The chemotherapy medication bag should finish at 46 hours, 96 hours, or 7 days. For example, if your pump is scheduled for 46 hours and it was put on at 4:00 p.m., it should finish at 2:00 p.m. the day it is scheduled to come off regardless of your appointment time.     Estimated time to finish at 10:45 FRIDAY, February 21, 2024.   If the display on your pump reads "Low Volume" and it is beeping, take the batteries out of the pump and come to the cancer center for it to be taken off.   If the pump alarms go off prior to the pump reading "Low Volume" then call [phone] and someone can assist you.  If the plunger comes out and the chemotherapy medication is leaking out, please use your home chemo spill kit to clean up the spill. Do NOT use paper towels or other household products.  If you have problems or questions regarding your pump, please call either [phone] (24 hours a day) or the cancer center Monday-Friday 8:00 a.m.- 4:30 p.m. at the clinic number and we will assist you. If you are unable to get assistance, then go to the nearest Emergency Department and ask the staff to contact the IV team for assistance.   Thank you for choosing [facility] to provide your oncology and hematology care.   If you have a lab appointment with the Cancer Center, please go directly to the Cancer Center and check in at the registration area.   Wear comfortable clothing and clothing appropriate for easy access to any Portacath or PICC line.   We strive to give you quality time with your provider. You may need to reschedule your appointment if you arrive late (15 or more minutes).  Arriving late affects you and other patients whose appointments are after yours.  Also, if you miss three or more appointments without notifying the office, you may be dismissed from the clinic at the provider's  discretion.      For prescription refill requests, have your pharmacy contact our office and allow 72 hours for refills to be completed.    Today you received the following chemotherapy and/or immunotherapy agents IRINOTECAN/LEUCOVORIN/FLUOROURACIL      To help prevent nausea and vomiting after your treatment, we encourage you to take your nausea medication as directed.  BELOW ARE SYMPTOMS THAT SHOULD BE REPORTED IMMEDIATELY: *FEVER GREATER THAN 100.4 F (38 C) OR HIGHER *CHILLS OR SWEATING *NAUSEA AND VOMITING THAT IS NOT CONTROLLED WITH YOUR NAUSEA MEDICATION *UNUSUAL SHORTNESS OF BREATH *UNUSUAL BRUISING OR BLEEDING *URINARY PROBLEMS (pain or burning when urinating, or frequent urination) *BOWEL PROBLEMS (unusual diarrhea, constipation, pain near the anus) TENDERNESS IN MOUTH AND THROAT WITH OR WITHOUT PRESENCE OF ULCERS (sore throat, sores in mouth, or a toothache) UNUSUAL RASH, SWELLING OR PAIN  UNUSUAL VAGINAL DISCHARGE OR ITCHING   Items with * indicate a potential emergency and should be followed up as soon as possible or go to the Emergency Department if any problems should occur.  Please show the CHEMOTHERAPY ALERT CARD or IMMUNOTHERAPY ALERT CARD at check-in to the Emergency Department and triage nurse.  Should you have questions after your visit or need to cancel or reschedule your appointment, please contact [facility]  Dept: [phone]  and follow the prompts.  Office hours  are 8:00 a.m. to 4:30 p.m. Monday - Friday. Please note that voicemails left after 4:00 p.m. may not be returned until the following business day.  We are closed weekends and major holidays. You have access to a nurse at all times for urgent questions. Please call the main number to the clinic Dept: [phone] and follow the prompts.   For any non-urgent questions, you may also contact your provider using MyChart. We now offer e-Visits for  anyone 58 and older to request care online for non-urgent symptoms. For details visit [email or web address].   Also download the MyChart app! Go to the app store, search "MyChart", open the app, select [facility], and log in with your MyChart username and password.  Irinotecan Injection What is this medication? IRINOTECAN (ir in oh TEE kan) treats some types of cancer. It works by slowing down the growth of cancer cells. This medicine may be used for other purposes; ask your health care provider or pharmacist if you have questions. COMMON BRAND NAME(S): Camptosar What should I tell my care team before I take this medication? They need to know if you have any of these conditions: Dehydration Diarrhea Infection, especially a viral infection, such as chickenpox, cold sores, herpes Liver disease Low blood cell levels (white cells, red cells, and platelets) Low levels of electrolytes, such as calcium, magnesium, or potassium in your blood Recent or ongoing radiation An unusual or allergic reaction to irinotecan, other medications, foods, dyes, or preservatives If you or your partner are pregnant or trying to get pregnant Breast-feeding How should I use this medication? This medication is injected into a vein. It is given by your care team in a hospital or clinic setting. Talk to your care team about the use of this medication in children. Special care may be needed. Overdosage: If you think you have taken too much of this medicine contact a poison control center or emergency room at once. NOTE: This medicine is only for you. Do not share this medicine with others. What if I miss a dose? Keep appointments for follow-up doses. It is important not to miss your dose. Call your care team if you are unable to keep an appointment. What may interact with this medication? Do not take this medication with any of the following: Cobicistat Itraconazole This medication may also interact with the  following: Certain antibiotics, such as clarithromycin, rifampin, rifabutin Certain antivirals for HIV or AIDS Certain medications for fungal infections, such as ketoconazole, posaconazole, voriconazole Certain medications for seizures, such as carbamazepine, phenobarbital, phenytoin Gemfibrozil Nefazodone St. John's wort This list may not describe all possible interactions. Give your health care provider a list of all the medicines, herbs, non-prescription drugs, or dietary supplements you use. Also tell them if you smoke, drink alcohol, or use illegal drugs. Some items may interact with your medicine. What should I watch for while using this medication? Your condition will be monitored carefully while you are receiving this medication. You may need blood work while taking this medication. This medication may make you feel generally unwell. This is not uncommon as chemotherapy can affect healthy cells as well as cancer cells. Report any side effects. Continue your course of treatment even though you feel ill unless your care team tells you to stop. This medication can cause serious side effects. To reduce the risk, your care team may give you other medications to take before receiving this one. Be sure to follow the directions from your care team. This medication may  affect your coordination, reaction time, or judgement. Do not drive or operate machinery until you know how this medication affects you. Sit up or stand slowly to reduce the risk of dizzy or fainting spells. Drinking alcohol with this medication can increase the risk of these side effects. This medication may increase your risk of getting an infection. Call your care team for advice if you get a fever, chills, sore throat, or other symptoms of a cold or flu. Do not treat yourself. Try to avoid being around people who are sick. Avoid taking medications that contain aspirin, acetaminophen, ibuprofen, naproxen, or ketoprofen unless  instructed by your care team. These medications may hide a fever. This medication may increase your risk to bruise or bleed. Call your care team if you notice any unusual bleeding. Be careful brushing or flossing your teeth or using a toothpick because you may get an infection or bleed more easily. If you have any dental work done, tell your dentist you are receiving this medication. Talk to your care team if you or your partner are pregnant or think either of you might be pregnant. This medication can cause serious birth defects if taken during pregnancy and for 6 months after the last dose. You will need a negative pregnancy test before starting this medication. Contraception is recommended while taking this medication and for 6 months after the last dose. Your care team can help you find the option that works for you. Do not father a child while taking this medication and for 3 months after the last dose. Use a condom for contraception during this time period. Do not breastfeed while taking this medication and for 7 days after the last dose. This medication may cause infertility. Talk to your care team if you are concerned about your fertility. What side effects may I notice from receiving this medication? Side effects that you should report to your care team as soon as possible: Allergic reactions--skin rash, itching, hives, swelling of the face, lips, tongue, or throat Dry cough, shortness of Leucovorin Injection What is this medication? LEUCOVORIN (loo koe VOR in) prevents side effects from certain medications, such as methotrexate. It works by increasing folate levels. This helps protect healthy cells in your body. It may also be used to treat anemia caused by low levels of folate. It can also be used with fluorouracil, a type of chemotherapy, to treat colorectal cancer. It works by increasing the effects of fluorouracil in the body. This medicine may be used for other purposes; ask your health  care provider or pharmacist if you have questions. What should I tell my care team before I take this medication? They need to know if you have any of these conditions: Anemia from low levels of vitamin B12 in the blood An unusual or allergic reaction to leucovorin, folic acid, other medications, foods, dyes, or preservatives Pregnant or trying to get pregnant Breastfeeding How should I use this medication? This medication is injected into a vein or a muscle. It is given by your care team in a hospital or clinic setting. Talk to your care team about the use of this medication in children. Special care may be needed. Overdosage: If you think you have taken too much of this medicine contact a poison control center or emergency room at once. NOTE: This medicine is only for you. Do not share this medicine with others. What if I miss a dose? Keep appointments for follow-up doses. It is important not to miss your  dose. Call your care team if you are unable to keep an appointment. What may interact with this medication? Capecitabine Fluorouracil Phenobarbital Phenytoin Primidone Trimethoprim;sulfamethoxazole This list may not describe all possible interactions. Give your health care provider a list of all the medicines, herbs, non-prescription drugs, or dietary supplements you use. Also tell them if you smoke, drink alcohol, or use illegal drugs. Some items may interact with your medicine. What should I watch for while using this medication? Your condition will be monitored carefully while you are receiving this medication. This medication may increase the side effects of 5-fluorouracil. Tell your care team if you have diarrhea or mouth sores that do not get better or that get worse. What side effects may I notice from receiving this medication? Side effects that you should report to your care team as soon as possible: Allergic reactions--skin rash, itching, hives, swelling of the face, lips,  tongue, or throat This list may not describe all possible side effects. Call your doctor for medical advice about side effects. You may report side effects to FDA at 1-800-FDA-1088. Where should I keep my medication? This medication is given in a hospital or clinic. It will not be stored at home. NOTE: This sheet is a summary. It may not cover all possible information. If you have questions about this medicine, talk to your doctor, pharmacist, or health care provider.  2024 Elsevier/Gold Standard (2022-05-08 00:00:00)breath or trouble breathing Increased saliva or tears, increased sweating, stomach cramping, diarrhea, small pupils, unusual weakness or fatigue, slow heartbeat Infection--fever, chills, cough, sore throat, wounds that don't heal, pain or trouble when passing urine, general feeling of discomfort or being unwell Kidney injury--decrease in the amount of urine, swelling of the ankles, hands, or feet Low red blood cell level--unusual weakness or fatigue, dizziness, headache, trouble breathing Severe or prolonged diarrhea Unusual bruising or bleeding Side effects that usually do not require medical attention (report to your care team if they continue or are bothersome): Constipation Diarrhea Hair loss Loss of appetite Nausea Stomach pain This list may not describe all possible side effects. Call your doctor for medical advice about side effects. You may report side effects to FDA at 1-800-FDA-1088. Where should I keep my medication? This medication is given in a hospital or clinic. It will not be stored at home. NOTE: This sheet is a summary. It may not cover all possible information. If you have questions about this medicine, talk to your doctor, pharmacist, or health care provider.  2024 Elsevier/Gold Standard (2022-04-16 00:00:00)  Fluorouracil Injection What is this medication? FLUOROURACIL (flure oh YOOR a sil) treats some types of cancer. It works by slowing down the growth  of cancer cells. This medicine may be used for other purposes; ask your health care provider or pharmacist if you have questions. COMMON BRAND NAME(S): Adrucil What should I tell my care team before I take this medication? They need to know if you have any of these conditions: Blood disorders Dihydropyrimidine dehydrogenase (DPD) deficiency Infection, such as chickenpox, cold sores, herpes Kidney disease Liver disease Poor nutrition Recent or ongoing radiation therapy An unusual or allergic reaction to fluorouracil, other medications, foods, dyes, or preservatives If you or your partner are pregnant or trying to get pregnant Breast-feeding How should I use this medication? This medication is injected into a vein. It is administered by your care team in a hospital or clinic setting. Talk to your care team about the use of this medication in children. Special care may  be needed. Overdosage: If you think you have taken too much of this medicine contact a poison control center or emergency room at once. NOTE: This medicine is only for you. Do not share this medicine with others. What if I miss a dose? Keep appointments for follow-up doses. It is important not to miss your dose. Call your care team if you are unable to keep an appointment. What may interact with this medication? Do not take this medication with any of the following: Live virus vaccines This medication may also interact with the following: Medications that treat or prevent blood clots, such as warfarin, enoxaparin, dalteparin This list may not describe all possible interactions. Give your health care provider a list of all the medicines, herbs, non-prescription drugs, or dietary supplements you use. Also tell them if you smoke, drink alcohol, or use illegal drugs. Some items may interact with your medicine. What should I watch for while using this medication? Your condition will be monitored carefully while you are receiving  this medication. This medication may make you feel generally unwell. This is not uncommon as chemotherapy can affect healthy cells as well as cancer cells. Report any side effects. Continue your course of treatment even though you feel ill unless your care team tells you to stop. In some cases, you may be given additional medications to help with side effects. Follow all directions for their use. This medication may increase your risk of getting an infection. Call your care team for advice if you get a fever, chills, sore throat, or other symptoms of a cold or flu. Do not treat yourself. Try to avoid being around people who are sick. This medication may increase your risk to bruise or bleed. Call your care team if you notice any unusual bleeding. Be careful brushing or flossing your teeth or using a toothpick because you may get an infection or bleed more easily. If you have any dental work done, tell your dentist you are receiving this medication. Avoid taking medications that contain aspirin, acetaminophen, ibuprofen, naproxen, or ketoprofen unless instructed by your care team. These medications may hide a fever. Do not treat diarrhea with over the counter products. Contact your care team if you have diarrhea that lasts more than 2 days or if it is severe and watery. This medication can make you more sensitive to the sun. Keep out of the sun. If you cannot avoid being in the sun, wear protective clothing and sunscreen. Do not use sun lamps, tanning beds, or tanning booths. Talk to your care team if you or your partner wish to become pregnant or think you might be pregnant. This medication can cause serious birth defects if taken during pregnancy and for 3 months after the last dose. A reliable form of contraception is recommended while taking this medication and for 3 months after the last dose. Talk to your care team about effective forms of contraception. Do not father a child while taking this  medication and for 3 months after the last dose. Use a condom while having sex during this time period. Do not breastfeed while taking this medication. This medication may cause infertility. Talk to your care team if you are concerned about your fertility. What side effects may I notice from receiving this medication? Side effects that you should report to your care team as soon as possible: Allergic reactions--skin rash, itching, hives, swelling of the face, lips, tongue, or throat Heart attack--pain or tightness in the chest, shoulders, arms, or  jaw, nausea, shortness of breath, cold or clammy skin, feeling faint or lightheaded Heart failure--shortness of breath, swelling of the ankles, feet, or hands, sudden weight gain, unusual weakness or fatigue Heart rhythm changes--fast or irregular heartbeat, dizziness, feeling faint or lightheaded, chest pain, trouble breathing High ammonia level--unusual weakness or fatigue, confusion, loss of appetite, nausea, vomiting, seizures Infection--fever, chills, cough, sore throat, wounds that don't heal, pain or trouble when passing urine, general feeling of discomfort or being unwell Low red blood cell level--unusual weakness or fatigue, dizziness, headache, trouble breathing Pain, tingling, or numbness in the hands or feet, muscle weakness, change in vision, confusion or trouble speaking, loss of balance or coordination, trouble walking, seizures Redness, swelling, and blistering of the skin over hands and feet Severe or prolonged diarrhea Unusual bruising or bleeding Side effects that usually do not require medical attention (report to your care team if they continue or are bothersome): Dry skin Headache Increased tears Nausea Pain, redness, or swelling with sores inside the mouth or throat Sensitivity to light Vomiting This list may not describe all possible side effects. Call your doctor for medical advice about side effects. You may report side  effects to FDA at 1-800-FDA-1088. Where should I keep my medication? This medication is given in a hospital or clinic. It will not be stored at home. NOTE: This sheet is a summary. It may not cover all possible information. If you have questions about this medicine, talk to your doctor, pharmacist, or health care provider.  2024 Elsevier/Gold Standard (2022-04-10 00:00:00)

## 2024-02-19 NOTE — Progress Notes (Signed)
 Patient seen by Dr. Thornton Papas today  Vitals are within treatment parameters:Yes   Labs are within treatment parameters: Yes Alkaline phosphatase 272  Treatment plan has been signed: Yes   Per physician team, Patient is ready for treatment and there are NO modifications to the treatment plan.

## 2024-02-19 NOTE — Progress Notes (Signed)
 [facility] OFFICE PROGRESS NOTE   Diagnosis: Pancreas cancer  INTERVAL HISTORY:   David. Reiland complete another cycle of chemotherapy on 02/05/2024.  No nausea/vomiting, mouth sores, or diarrhea.  He reports stable numbness in the extremities.  He had more fatigue following this cycle of chemotherapy.  He had 1 evening of hiccups.  Good appetite.  He is working.  Objective:  Vital signs in last 24 hours:  Blood pressure 117/70, pulse 77, temperature 98.2 F (36.8 C), temperature source Temporal, resp. rate 18, height 5\' 8"  (1.727 m), weight 160 lb 6.4 oz (72.8 kg), SpO2 99%.    HEENT: No thrush or ulcers Resp: Lungs clear bilaterally Cardio: Regular rate and rhythm GI: No mass, nontender, no hepatosplenomegaly Vascular: No leg edema  Skin: Palms without erythema  Portacath/PICC-without erythema  Lab Results:  Lab Results  Component Value Date   WBC 5.7 02/19/2024   HGB 11.8 (L) 02/19/2024   HCT 34.9 (L) 02/19/2024   MCV 103.9 (H) 02/19/2024   PLT 142 (L) 02/19/2024   NEUTROABS 3.9 02/19/2024    CMP  Lab Results  Component Value Date   NA 136 02/05/2024   K 4.1 02/05/2024   CL 103 02/05/2024   CO2 24 02/05/2024   GLUCOSE 288 (H) 02/05/2024   BUN 16 02/05/2024   CREATININE 0.83 02/05/2024   CALCIUM 9.7 02/05/2024   PROT 7.3 02/05/2024   ALBUMIN 4.0 02/05/2024   AST 27 02/05/2024   ALT 27 02/05/2024   ALKPHOS 243 (H) 02/05/2024   BILITOT 0.7 02/05/2024   GFRNONAA >60 02/05/2024   GFRAA >60 03/26/2018    Lab Results  Component Value Date   CEA 772.45 (H) 05/02/2023   NGE952 127 (H) 02/05/2024      Medications: I have reviewed the patient's current medications.   Assessment/Plan: Pancreas cancer CT angiogram chest 04/24/2023-low-density mass in the dome of the liver with at least 3 additional lesions in both hepatic lobes, borderline lymphadenopathy in the hepatoduodenal ligament Right upper quadrant ultrasound 04/24/2023-large  circumscribed mass of the liver dome CT abdomen/pelvis 04/24/2023-multiple liver lesions poorly defined on noncontrast exam, prominent portacaval and porta hepatis nodes MRI abdomen 05/05/2023-multiple rim-hypoenhancing liver lesions consistent with hepatic metastases, suspicion for a pancreas tail mass Ultrasound-guided biopsy of dominant right liver lesion 05/10/2023-adenocarcinoma, CK7 and CDX2 positive, abundant cytoplasm and mucin with extracellular mucin, foci of lymphovascular invasion Tempus gene panel-K-ras G12V, T P53 mild tumor mutation burden 3.2, MSS PET 06/04/2023-multiple hypermetabolic liver lesions consistent with metastases, low-level FDG uptake in the soft tissue fullness at the tip of the pancreas tail, low-level FDG activity involving a small soft tissue nodule between the gastric fundus and spleen, tiny foci of accumulation at the right costovertebral junction at T10 and roof of the left acetabulum without an underlying CT lesion Elevated CEA and CA 19-9 EUS 06/07/2023 ,22x 15 mm irregular mass in the tail the pancreas, 11 mm subcarinal lymph node, FNA biopsy of the pancreas mass-suspicious for malignancy Cycle 1 FOLFIRINOX 06/19/2023 Cycle 2 FOLFIRINOX 07/03/2023 Cycle 3 FOLFIRINOX 07/17/2023 Cycle 4 FOLFIRINOX 07/31/2023 Cycle 5 FOLFIRINOX 08/14/2023, oxaliplatin and Decadron dose reduced Cycle 6 FOLFIRINOX 08/28/2023 Cycle 7 FOLFIRINOX 09/11/2023 CTs at [facility] 09/16/2023, compared to 04/24/2023-unchanged pancreas tail mass, decrease in attenuation of hepatic metastases, several lesions have decreased in size, no new lesions Cycle 8 FOLFIRINOX 09/25/2023 Cycle 9 FOLFIRINOX 10/09/2023 Cycle 10 FOLFIRINOX 10/22/2023 Cycle 11 FOLFIRINOX 11/05/2023 Cycle 12 FOLFIRINOX 11/20/2023 Cycle 13 FOLFIRINOX 12/04/2023 Cycle 14 FOLFIRINOX 12/19/2023 CTs at  [facility] 12/23/2023: Stable to slight decrease in hepatic metastases, no new lesions, stable pancreas tail mass MRI liver at [facility] 12/26/2023: Multifocal  nonenhancing hepatic metastases, pancreas tail mass not well-visualized Cycle 15 FOLFIRINOX 01/01/2024, 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  Resolved with Reglan.  Recurrent hiccups following cycle 4 FOLFIRINOX-not relieved with Reglan      Disposition: David [name] appears stable.  The CA 19-9 was lower on 02/05/2024.  He is tolerating the chemotherapy well.  He will complete another cycle of FOLFIRI today.  He will return for an office visit and chemotherapy in 2 weeks.  He is scheduled for restaging CTs at [facility] in April.  Thornton Papas, MD  02/19/2024  9:28 AM

## 2024-02-20 ENCOUNTER — Other Ambulatory Visit: Payer: Self-pay

## 2024-02-20 LAB — CANCER ANTIGEN 19-9: CA 19-9: 97 U/mL — ABNORMAL HIGH (ref 0–35)

## 2024-02-21 ENCOUNTER — Inpatient Hospital Stay: Payer: No Typology Code available for payment source

## 2024-02-21 VITALS — BP 126/79 | HR 78 | Temp 98.6°F

## 2024-02-21 DIAGNOSIS — C259 Malignant neoplasm of pancreas, unspecified: Secondary | ICD-10-CM

## 2024-02-21 DIAGNOSIS — Z5111 Encounter for antineoplastic chemotherapy: Secondary | ICD-10-CM | POA: Diagnosis not present

## 2024-02-21 MED ORDER — HEPARIN SOD (PORK) LOCK FLUSH 100 UNIT/ML IV SOLN
500.0000 [IU] | Freq: Once | INTRAVENOUS | Status: AC | PRN
Start: 2024-02-21 — End: 2024-02-21
  Administered 2024-02-21: 500 [IU]

## 2024-02-21 MED ORDER — PEGFILGRASTIM-JMDB 6 MG/0.6ML ~~LOC~~ SOSY
6.0000 mg | PREFILLED_SYRINGE | Freq: Once | SUBCUTANEOUS | Status: AC
  Administered 2024-02-21: 6 mg via SUBCUTANEOUS
  Filled 2024-02-21: qty 0.6

## 2024-02-21 MED ORDER — SODIUM CHLORIDE 0.9% FLUSH
10.0000 mL | INTRAVENOUS | Status: DC | PRN
  Administered 2024-02-21: 10 mL

## 2024-02-21 NOTE — Progress Notes (Signed)
 Patients presents today for pump stop and Fulphila injection. Patient's port flushed without difficulty.  Good blood return noted with no bruising or swelling noted at site.  Band aid applied.    Patient tolerated injection in SQ left arm with no complaints voiced.  Site clean and dry with no bruising or swelling noted.  No complaints of pain.  Discharged with vital signs stable and no signs or symptoms of distress noted.

## 2024-02-21 NOTE — Patient Instructions (Signed)
 [facility]  Discharge Instructions: Thank you for choosing [facility] to provide your oncology and hematology care.   If you have a lab appointment with the Cancer Center, please go directly to the Cancer Center and check in at the registration area.   Wear comfortable clothing and clothing appropriate for easy access to any Portacath or PICC line.   We strive to give you quality time with your provider. You may need to reschedule your appointment if you arrive late (15 or more minutes).  Arriving late affects you and other patients whose appointments are after yours.  Also, if you miss three or more appointments without notifying the office, you may be dismissed from the clinic at the provider's discretion.      For prescription refill requests, have your pharmacy contact our office and allow 72 hours for refills to be completed.    Today you received the following chemotherapy and/or immunotherapy agents Fulphila.  Pegfilgrastim Injection What is this medication? PEGFILGRASTIM (PEG fil gra stim) lowers the risk of infection in people who are receiving chemotherapy. It works by Systems analyst make more white blood cells, which protects your body from infection. It may also be used to help people who have been exposed to high doses of radiation. This medicine may be used for other purposes; ask your health care provider or pharmacist if you have questions. COMMON BRAND NAME(S): Cherly Hensen, Neulasta, Nyvepria, Stimufend, UDENYCA, UDENYCA ONBODY, Ziextenzo What should I tell my care team before I take this medication? They need to know if you have any of these conditions: Kidney disease Latex allergy Ongoing radiation therapy Sickle cell disease Skin reactions to acrylic adhesives (On-Body Injector only) An unusual or allergic reaction to pegfilgrastim, filgrastim, other medications, foods, dyes, or  preservatives Pregnant or trying to get pregnant Breast-feeding How should I use this medication? This medication is for injection under the skin. If you get this medication at home, you will be taught how to prepare and give the pre-filled syringe or how to use the On-body Injector. Refer to the patient Instructions for Use for detailed instructions. Use exactly as directed. Tell your care team immediately if you suspect that the On-body Injector may not have performed as intended or if you suspect the use of the On-body Injector resulted in a missed or partial dose. It is important that you put your used needles and syringes in a special sharps container. Do not put them in a trash can. If you do not have a sharps container, call your pharmacist or care team to get one. Talk to your care team about the use of this medication in children. While this medication may be prescribed for selected conditions, precautions do apply. Overdosage: If you think you have taken too much of this medicine contact a poison control center or emergency room at once. NOTE: This medicine is only for you. Do not share this medicine with others. What if I miss a dose? It is important not to miss your dose. Call your care team if you miss your dose. If you miss a dose due to an On-body Injector failure or leakage, a new dose should be administered as soon as possible using a single prefilled syringe for manual use. What may interact with this medication? Interactions have not been studied. This list may not describe all possible interactions. Give your health care provider a list of all the  medicines, herbs, non-prescription drugs, or dietary supplements you use. Also tell them if you smoke, drink alcohol, or use illegal drugs. Some items may interact with your medicine. What should I watch for while using this medication? Your condition will be monitored carefully while you are receiving this medication. You may need blood  work done while you are taking this medication. Talk to your care team about your risk of cancer. You may be more at risk for certain types of cancer if you take this medication. If you are going to need a MRI, CT scan, or other procedure, tell your care team that you are using this medication (On-Body Injector only). What side effects may I notice from receiving this medication? Side effects that you should report to your care team as soon as possible: Allergic reactions--skin rash, itching, hives, swelling of the face, lips, tongue, or throat Capillary leak syndrome--stomach or muscle pain, unusual weakness or fatigue, feeling faint or lightheaded, decrease in the amount of urine, swelling of the ankles, hands, or feet, trouble breathing High white blood cell level--fever, fatigue, trouble breathing, night sweats, change in vision, weight loss Inflammation of the aorta--fever, fatigue, back, chest, or stomach pain, severe headache Kidney injury (glomerulonephritis)--decrease in the amount of urine, red or dark brown urine, foamy or bubbly urine, swelling of the ankles, hands, or feet Shortness of breath or trouble breathing Spleen injury--pain in upper left stomach or shoulder Unusual bruising or bleeding Side effects that usually do not require medical attention (report to your care team if they continue or are bothersome): Bone pain Pain in the hands or feet This list may not describe all possible side effects. Call your doctor for medical advice about side effects. You may report side effects to FDA at 1-800-FDA-1088. Where should I keep my medication? Keep out of the reach of children. If you are using this medication at home, you will be instructed on how to store it. Throw away any unused medication after the expiration date on the label. NOTE: This sheet is a summary. It may not cover all possible information. If you have questions about this medicine, talk to your doctor, pharmacist, or  health care provider.  2024 Elsevier/Gold Standard (2021-11-03 00:00:00)      To help prevent nausea and vomiting after your treatment, we encourage you to take your nausea medication as directed.  BELOW ARE SYMPTOMS THAT SHOULD BE REPORTED IMMEDIATELY: *FEVER GREATER THAN 100.4 F (38 C) OR HIGHER *CHILLS OR SWEATING *NAUSEA AND VOMITING THAT IS NOT CONTROLLED WITH YOUR NAUSEA MEDICATION *UNUSUAL SHORTNESS OF BREATH *UNUSUAL BRUISING OR BLEEDING *URINARY PROBLEMS (pain or burning when urinating, or frequent urination) *BOWEL PROBLEMS (unusual diarrhea, constipation, pain near the anus) TENDERNESS IN MOUTH AND THROAT WITH OR WITHOUT PRESENCE OF ULCERS (sore throat, sores in mouth, or a toothache) UNUSUAL RASH, SWELLING OR PAIN  UNUSUAL VAGINAL DISCHARGE OR ITCHING   Items with * indicate a potential emergency and should be followed up as soon as possible or go to the Emergency Department if any problems should occur.  Please show the CHEMOTHERAPY ALERT CARD or IMMUNOTHERAPY ALERT CARD at check-in to the Emergency Department and triage nurse.  Should you have questions after your visit or need to cancel or reschedule your appointment, please contact [facility]  Dept: [phone]  and follow the prompts.  Office hours are 8:00 a.m. to 4:30 p.m. Monday - Friday. Please note that voicemails left  after 4:00 p.m. may not be returned until the following business day.  We are closed weekends and major holidays. You have access to a nurse at all times for urgent questions. Please call the main number to the clinic Dept: [phone] and follow the prompts.   For any non-urgent questions, you may also contact your provider using MyChart. We now offer e-Visits for anyone 15 and older to request care online for non-urgent symptoms. For details visit [email or web address].   Also download the MyChart app! Go to the app store, search "MyChart",  open the app, select [facility], and log in with your MyChart username and password.

## 2024-03-01 ENCOUNTER — Encounter: Payer: Self-pay | Admitting: Nurse Practitioner

## 2024-03-03 ENCOUNTER — Encounter: Payer: Self-pay | Admitting: *Deleted

## 2024-03-03 ENCOUNTER — Encounter: Payer: Self-pay | Admitting: Oncology

## 2024-03-04 ENCOUNTER — Inpatient Hospital Stay: Payer: 59

## 2024-03-04 ENCOUNTER — Encounter: Payer: Self-pay | Admitting: Nurse Practitioner

## 2024-03-04 ENCOUNTER — Inpatient Hospital Stay (HOSPITAL_BASED_OUTPATIENT_CLINIC_OR_DEPARTMENT_OTHER): Payer: 59 | Admitting: Nurse Practitioner

## 2024-03-04 VITALS — BP 122/76 | HR 77 | Temp 98.1°F | Resp 18 | Ht 68.0 in | Wt 162.7 lb

## 2024-03-04 DIAGNOSIS — Z5111 Encounter for antineoplastic chemotherapy: Secondary | ICD-10-CM | POA: Diagnosis not present

## 2024-03-04 DIAGNOSIS — C259 Malignant neoplasm of pancreas, unspecified: Secondary | ICD-10-CM

## 2024-03-04 DIAGNOSIS — C787 Secondary malignant neoplasm of liver and intrahepatic bile duct: Secondary | ICD-10-CM | POA: Diagnosis not present

## 2024-03-04 LAB — CMP (CANCER CENTER ONLY)
ALT: 30 U/L (ref 0–44)
AST: 21 U/L (ref 15–41)
Albumin: 4.3 g/dL (ref 3.5–5.0)
Alkaline Phosphatase: 283 U/L — ABNORMAL HIGH (ref 38–126)
Anion gap: 9 (ref 5–15)
BUN: 17 mg/dL (ref 6–20)
CO2: 26 mmol/L (ref 22–32)
Calcium: 9.5 mg/dL (ref 8.9–10.3)
Chloride: 101 mmol/L (ref 98–111)
Creatinine: 0.69 mg/dL (ref 0.61–1.24)
GFR, Estimated: >60 mL/min (ref >60)
Glucose, Bld: 257 mg/dL — ABNORMAL HIGH (ref 70–99)
Potassium: 4.1 mmol/L (ref 3.5–5.1)
Sodium: 136 mmol/L (ref 135–145)
Total Bilirubin: 0.6 mg/dL (ref 0.0–1.2)
Total Protein: 7 g/dL (ref 6.5–8.1)

## 2024-03-04 LAB — CBC WITH DIFFERENTIAL (CANCER CENTER ONLY)
Abs Immature Granulocytes: 0.05 10*3/uL (ref 0.00–0.07)
Basophils Absolute: 0 10*3/uL (ref 0.0–0.1)
Basophils Relative: 0 %
Eosinophils Absolute: 0.1 10*3/uL (ref 0.0–0.5)
Eosinophils Relative: 2 %
HCT: 34.9 % — ABNORMAL LOW (ref 39.0–52.0)
Hemoglobin: 12 g/dL — ABNORMAL LOW (ref 13.0–17.0)
Immature Granulocytes: 1 %
Lymphocytes Relative: 19 %
Lymphs Abs: 0.9 10*3/uL (ref 0.7–4.0)
MCH: 35.6 pg — ABNORMAL HIGH (ref 26.0–34.0)
MCHC: 34.4 g/dL (ref 30.0–36.0)
MCV: 103.6 fL — ABNORMAL HIGH (ref 80.0–100.0)
Monocytes Absolute: 0.6 10*3/uL (ref 0.1–1.0)
Monocytes Relative: 12 %
Neutro Abs: 3 10*3/uL (ref 1.7–7.7)
Neutrophils Relative %: 66 %
Platelet Count: 137 10*3/uL — ABNORMAL LOW (ref 150–400)
RBC: 3.37 MIL/uL — ABNORMAL LOW (ref 4.22–5.81)
RDW: 12.1 % (ref 11.5–15.5)
WBC Count: 4.6 10*3/uL (ref 4.0–10.5)
nRBC: 0 % (ref 0.0–0.2)

## 2024-03-04 MED ORDER — SODIUM CHLORIDE 0.9 % IV SOLN
150.0000 mg | Freq: Once | INTRAVENOUS | Status: AC
  Administered 2024-03-04: 150 mg via INTRAVENOUS
  Filled 2024-03-04: qty 150

## 2024-03-04 MED ORDER — SODIUM CHLORIDE 0.9 % IV SOLN: INTRAVENOUS | Status: DC

## 2024-03-04 MED ORDER — LEUCOVORIN CALCIUM INJECTION 350 MG
400.0000 mg/m2 | Freq: Once | INTRAMUSCULAR | Status: AC
  Administered 2024-03-04: 744 mg via INTRAVENOUS
  Filled 2024-03-04: qty 37.2

## 2024-03-04 MED ORDER — SODIUM CHLORIDE 0.9 % IV SOLN
2400.0000 mg/m2 | INTRAVENOUS | Status: DC
  Administered 2024-03-04: 4450 mg via INTRAVENOUS
  Filled 2024-03-04: qty 89

## 2024-03-04 MED ORDER — DEXTROSE 5 % IV SOLN: Freq: Once | INTRAVENOUS | Status: DC

## 2024-03-04 MED ORDER — SODIUM CHLORIDE 0.9 % IV SOLN
150.0000 mg/m2 | Freq: Once | INTRAVENOUS | Status: AC
  Administered 2024-03-04: 300 mg via INTRAVENOUS
  Filled 2024-03-04: qty 15

## 2024-03-04 MED ORDER — PALONOSETRON HCL INJECTION 0.25 MG/5ML
0.2500 mg | Freq: Once | INTRAVENOUS | Status: AC
  Administered 2024-03-04: 0.25 mg via INTRAVENOUS
  Filled 2024-03-04: qty 5

## 2024-03-04 MED ORDER — DEXAMETHASONE SODIUM PHOSPHATE 10 MG/ML IJ SOLN
5.0000 mg | Freq: Once | INTRAMUSCULAR | Status: AC
  Administered 2024-03-04: 5 mg via INTRAVENOUS
  Filled 2024-03-04: qty 1

## 2024-03-04 MED ORDER — ATROPINE SULFATE 1 MG/ML IV SOLN
0.5000 mg | Freq: Once | INTRAVENOUS | Status: AC | PRN
  Administered 2024-03-04: 0.5 mg via INTRAVENOUS
  Filled 2024-03-04: qty 1

## 2024-03-04 NOTE — Patient Instructions (Addendum)
 [facility]   Discharge Instructions: The chemotherapy medication bag should finish at 46 hours, 96 hours, or 7 days. For example, if your pump is scheduled for 46 hours and it was put on at 4:00 p.m., it should finish at 2:00 p.m. the day it is scheduled to come off regardless of your appointment time.     Estimated time to finish at 11:30 FRIDAY, March 06, 2024.   If the display on your pump reads "Low Volume" and it is beeping, take the batteries out of the pump and come to the cancer center for it to be taken off.   If the pump alarms go off prior to the pump reading "Low Volume" then call [phone] and someone can assist you.  If the plunger comes out and the chemotherapy medication is leaking out, please use your home chemo spill kit to clean up the spill. Do NOT use paper towels or other household products.  If you have problems or questions regarding your pump, please call either [phone] (24 hours a day) or the cancer center Monday-Friday 8:00 a.m.- 4:30 p.m. at the clinic number and we will assist you. If you are unable to get assistance, then go to the nearest Emergency Department and ask the staff to contact the IV team for assistance.   Thank you for choosing [facility] to provide your oncology and hematology care.   If you have a lab appointment with the Cancer Center, please go directly to the Cancer Center and check in at the registration area.   Wear comfortable clothing and clothing appropriate for easy access to any Portacath or PICC line.   We strive to give you quality time with your provider. You may need to reschedule your appointment if you arrive late (15 or more minutes).  Arriving late affects you and other patients whose appointments are after yours.  Also, if you miss three or more appointments without notifying the office, you may be dismissed from the clinic at the provider's  discretion.      For prescription refill requests, have your pharmacy contact our office and allow 72 hours for refills to be completed.    Today you received the following chemotherapy and/or immunotherapy agents IRINOTECAN, LEUCOVORIN, FLUOROURACIL      To help prevent nausea and vomiting after your treatment, we encourage you to take your nausea medication as directed.  BELOW ARE SYMPTOMS THAT SHOULD BE REPORTED IMMEDIATELY: *FEVER GREATER THAN 100.4 F (38 C) OR HIGHER *CHILLS OR SWEATING *NAUSEA AND VOMITING THAT IS NOT CONTROLLED WITH YOUR NAUSEA MEDICATION *UNUSUAL SHORTNESS OF BREATH *UNUSUAL BRUISING OR BLEEDING *URINARY PROBLEMS (pain or burning when urinating, or frequent urination) *BOWEL PROBLEMS (unusual diarrhea, constipation, pain near the anus) TENDERNESS IN MOUTH AND THROAT WITH OR WITHOUT PRESENCE OF ULCERS (sore throat, sores in mouth, or a toothache) UNUSUAL RASH, SWELLING OR PAIN  UNUSUAL VAGINAL DISCHARGE OR ITCHING   Items with * indicate a potential emergency and should be followed up as soon as possible or go to the Emergency Department if any problems should occur.  Please show the CHEMOTHERAPY ALERT CARD or IMMUNOTHERAPY ALERT CARD at check-in to the Emergency Department and triage nurse.  Should you have questions after your visit or need to cancel or reschedule your appointment, please contact [facility]  Dept: [phone]  and follow the prompts.  Office hours are 8:00 a.m. to 4:30 p.m. Monday - Friday. Please note that voicemails left after 4:00 p.m. may not be returned until the following business day.  We are closed weekends and major holidays. You have access to a nurse at all times for urgent questions. Please call the main number to the clinic Dept: [phone] and follow the prompts.   For any non-urgent questions, you may also contact your provider using MyChart. We now offer e-Visits for  anyone 90 and older to request care online for non-urgent symptoms. For details visit [email or web address].   Also download the MyChart app! Go to the app store, search "MyChart", open the app, select [place], and log in with your MyChart username and password.  Irinotecan Injection What is this medication? IRINOTECAN (ir in oh TEE kan) treats some types of cancer. It works by slowing down the growth of cancer cells. This medicine may be used for other purposes; ask your health care provider or pharmacist if you have questions. COMMON BRAND NAME(S): Camptosar What should I tell my care team before I take this medication? They need to know if you have any of these conditions: Dehydration Diarrhea Infection, especially a viral infection, such as chickenpox, cold sores, herpes Liver disease Low blood cell levels (white cells, red cells, and platelets) Low levels of electrolytes, such as calcium, magnesium, or potassium in your blood Recent or ongoing radiation An unusual or allergic reaction to irinotecan, other medications, foods, dyes, or preservatives If you or your partner are pregnant or trying to get pregnant Breast-feeding How should I use this medication? This medication is injected into a vein. It is given by your care team in a hospital or clinic setting. Talk to your care team about the use of this medication in children. Special care may be needed. Overdosage: If you think you have taken too much of this medicine contact a poison control center or emergency room at once. NOTE: This medicine is only for you. Do not share this medicine with others. What if I miss a dose? Keep appointments for follow-up doses. It is important not to miss your dose. Call your care team if you are unable to keep an appointment. What may interact with this medication? Do not take this medication with any of the following: Cobicistat Itraconazole This medication may also interact with the  following: Certain antibiotics, such as clarithromycin, rifampin, rifabutin Certain antivirals for HIV or AIDS Certain medications for fungal infections, such as ketoconazole, posaconazole, voriconazole Certain medications for seizures, such as carbamazepine, phenobarbital, phenytoin Gemfibrozil Nefazodone St. John's wort This list may not describe all possible interactions. Give your health care provider a list of all the medicines, herbs, non-prescription drugs, or dietary supplements you use. Also tell them if you smoke, drink alcohol, or use illegal drugs. Some items may interact with your medicine. What should I watch for while using this medication? Your condition will be monitored carefully while you are receiving this medication. You may need blood work while taking this medication. This medication may make you feel generally unwell. This is not uncommon as chemotherapy can affect healthy cells as well as cancer cells. Report any side effects. Continue your course of treatment even though you feel ill unless your care team tells you to stop. This medication can cause serious side effects. To reduce the risk, your care team may give you other medications to take before receiving this one. Be sure to follow the directions from your care team. This  medication may affect your coordination, reaction time, or judgement. Do not drive or operate machinery until you know how this medication affects you. Sit up or stand slowly to reduce the risk of dizzy or fainting spells. Drinking alcohol with this medication can increase the risk of these side effects. This medication may increase your risk of getting an infection. Call your care team for advice if you get a fever, chills, sore throat, or other symptoms of a cold or flu. Do not treat yourself. Try to avoid being around people who are sick. Avoid taking medications that contain aspirin, acetaminophen, ibuprofen, naproxen, or ketoprofen unless  instructed by your care team. These medications may hide a fever. This medication may increase your risk to bruise or bleed. Call your care team if you notice any unusual bleeding. Be careful brushing or flossing your teeth or using a toothpick because you may get an infection or bleed more easily. If you have any dental work done, tell your dentist you are receiving this medication. Talk to your care team if you or your partner are pregnant or think either of you might be pregnant. This medication can cause serious birth defects if taken during pregnancy and for 6 months after the last dose. You will need a negative pregnancy test before starting this medication. Contraception is recommended while taking this medication and for 6 months after the last dose. Your care team can help you find the option that works for you. Do not father a child while taking this medication and for 3 months after the last dose. Use a condom for contraception during this time period. Do not breastfeed while taking this medication and for 7 days after the last dose. This medication may cause infertility. Talk to your care team if you are concerned about your fertility. What side effects may I notice from receiving this medication? Side effects that you should report to your care team as soon as possible: Allergic reactions--skin rash, itching, hives, swelling of the face, lips, tongue, or throat Dry cough, shortness of breath or trouble breathing Increased saliva or tears, increased sweating, stomach cramping, diarrhea, small pupils, unusual weakness or fatigue, slow heartbeat Infection--fever, chills, cough, sore throat, wounds that don't heal, pain or trouble when passing urine, general feeling of discomfort or being unwell Kidney injury--decrease in the amount of urine, swelling of the ankles, hands, or feet Low red blood cell level--unusual weakness or fatigue, dizziness, headache, trouble breathing Severe or prolonged  diarrhea Unusual bruising or bleeding Side effects that usually do not require medical attention (report to your care team if they continue or are bothersome): Constipation Diarrhea Hair loss Loss of appetite Nausea Stomach pain This list may not describe all possible side effects. Call your doctor for medical advice about side effects. You may report side effects to FDA at 1-800-FDA-1088. Where should I keep my medication? This medication is given in a hospital or clinic. It will not be stored at home. NOTE: This sheet is a summary. It may not cover all possible information. If you have questions about this medicine, talk to your doctor, pharmacist, or health care provider.  2024 Elsevier/Gold Standard (2022-04-16 00:00:00) Leucovorin Injection What is this medication? LEUCOVORIN (loo koe VOR in) prevents side effects from certain medications, such as methotrexate. It works by increasing folate levels. This helps protect healthy cells in your body. It may also be used to treat anemia caused by low levels of folate. It can also be used with fluorouracil, a  type of chemotherapy, to treat colorectal cancer. It works by increasing the effects of fluorouracil in the body. This medicine may be used for other purposes; ask your health care provider or pharmacist if you have questions. What should I tell my care team before I take this medication? They need to know if you have any of these conditions: Anemia from low levels of vitamin B12 in the blood An unusual or allergic reaction to leucovorin, folic acid, other medications, foods, dyes, or preservatives Pregnant or trying to get pregnant Breastfeeding How should I use this medication? This medication is injected into a vein or a muscle. It is given by your care team in a hospital or clinic setting. Talk to your care team about the use of this medication in children. Special care may be needed. Overdosage: If you think you have taken too much  of this medicine contact a poison control center or emergency room at once. NOTE: This medicine is only for you. Do not share this medicine with others. What if I miss a dose? Keep appointments for follow-up doses. It is important not to miss your dose. Call your care team if you are unable to keep an appointment. What may interact with this medication? Capecitabine Fluorouracil Phenobarbital Phenytoin Primidone Trimethoprim;sulfamethoxazole This list may not describe all possible interactions. Give your health care provider a list of all the medicines, herbs, non-prescription drugs, or dietary supplements you use. Also tell them if you smoke, drink alcohol, or use illegal drugs. Some items may interact with your medicine. What should I watch for while using this medication? Your condition will be monitored carefully while you are receiving this medication. This medication may increase the side effects of 5-fluorouracil. Tell your care team if you have diarrhea or mouth sores that do not get better or that get worse. What side effects may I notice from receiving this medication? Side effects that you should report to your care team as soon as possible: Allergic reactions--skin rash, itching, hives, swelling of the face, lips, tongue, or throat This list may not describe all possible side effects. Call your doctor for medical advice about side effects. You may report side effects to FDA at 1-800-FDA-1088. Where should I keep my medication? This medication is given in a hospital or clinic. It will not be stored at home. NOTE: This sheet is a summary. It may not cover all possible information. If you have questions about this medicine, talk to your doctor, pharmacist, or health care provider.  2024 Elsevier/Gold Standard (2022-05-08 00:00:00)  Fluorouracil Injection What is this medication? FLUOROURACIL (flure oh YOOR a sil) treats some types of cancer. It works by slowing down the growth of  cancer cells. This medicine may be used for other purposes; ask your health care provider or pharmacist if you have questions. COMMON BRAND NAME(S): Adrucil What should I tell my care team before I take this medication? They need to know if you have any of these conditions: Blood disorders Dihydropyrimidine dehydrogenase (DPD) deficiency Infection, such as chickenpox, cold sores, herpes Kidney disease Liver disease Poor nutrition Recent or ongoing radiation therapy An unusual or allergic reaction to fluorouracil, other medications, foods, dyes, or preservatives If you or your partner are pregnant or trying to get pregnant Breast-feeding How should I use this medication? This medication is injected into a vein. It is administered by your care team in a hospital or clinic setting. Talk to your care team about the use of this medication in children.  Special care may be needed. Overdosage: If you think you have taken too much of this medicine contact a poison control center or emergency room at once. NOTE: This medicine is only for you. Do not share this medicine with others. What if I miss a dose? Keep appointments for follow-up doses. It is important not to miss your dose. Call your care team if you are unable to keep an appointment. What may interact with this medication? Do not take this medication with any of the following: Live virus vaccines This medication may also interact with the following: Medications that treat or prevent blood clots, such as warfarin, enoxaparin, dalteparin This list may not describe all possible interactions. Give your health care provider a list of all the medicines, herbs, non-prescription drugs, or dietary supplements you use. Also tell them if you smoke, drink alcohol, or use illegal drugs. Some items may interact with your medicine. What should I watch for while using this medication? Your condition will be monitored carefully while you are receiving this  medication. This medication may make you feel generally unwell. This is not uncommon as chemotherapy can affect healthy cells as well as cancer cells. Report any side effects. Continue your course of treatment even though you feel ill unless your care team tells you to stop. In some cases, you may be given additional medications to help with side effects. Follow all directions for their use. This medication may increase your risk of getting an infection. Call your care team for advice if you get a fever, chills, sore throat, or other symptoms of a cold or flu. Do not treat yourself. Try to avoid being around people who are sick. This medication may increase your risk to bruise or bleed. Call your care team if you notice any unusual bleeding. Be careful brushing or flossing your teeth or using a toothpick because you may get an infection or bleed more easily. If you have any dental work done, tell your dentist you are receiving this medication. Avoid taking medications that contain aspirin, acetaminophen, ibuprofen, naproxen, or ketoprofen unless instructed by your care team. These medications may hide a fever. Do not treat diarrhea with over the counter products. Contact your care team if you have diarrhea that lasts more than 2 days or if it is severe and watery. This medication can make you more sensitive to the sun. Keep out of the sun. If you cannot avoid being in the sun, wear protective clothing and sunscreen. Do not use sun lamps, tanning beds, or tanning booths. Talk to your care team if you or your partner wish to become pregnant or think you might be pregnant. This medication can cause serious birth defects if taken during pregnancy and for 3 months after the last dose. A reliable form of contraception is recommended while taking this medication and for 3 months after the last dose. Talk to your care team about effective forms of contraception. Do not father a child while taking this medication  and for 3 months after the last dose. Use a condom while having sex during this time period. Do not breastfeed while taking this medication. This medication may cause infertility. Talk to your care team if you are concerned about your fertility. What side effects may I notice from receiving this medication? Side effects that you should report to your care team as soon as possible: Allergic reactions--skin rash, itching, hives, swelling of the face, lips, tongue, or throat Heart attack--pain or tightness in the chest,  shoulders, arms, or jaw, nausea, shortness of breath, cold or clammy skin, feeling faint or lightheaded Heart failure--shortness of breath, swelling of the ankles, feet, or hands, sudden weight gain, unusual weakness or fatigue Heart rhythm changes--fast or irregular heartbeat, dizziness, feeling faint or lightheaded, chest pain, trouble breathing High ammonia level--unusual weakness or fatigue, confusion, loss of appetite, nausea, vomiting, seizures Infection--fever, chills, cough, sore throat, wounds that don't heal, pain or trouble when passing urine, general feeling of discomfort or being unwell Low red blood cell level--unusual weakness or fatigue, dizziness, headache, trouble breathing Pain, tingling, or numbness in the hands or feet, muscle weakness, change in vision, confusion or trouble speaking, loss of balance or coordination, trouble walking, seizures Redness, swelling, and blistering of the skin over hands and feet Severe or prolonged diarrhea Unusual bruising or bleeding Side effects that usually do not require medical attention (report to your care team if they continue or are bothersome): Dry skin Headache Increased tears Nausea Pain, redness, or swelling with sores inside the mouth or throat Sensitivity to light Vomiting This list may not describe all possible side effects. Call your doctor for medical advice about side effects. You may report side effects to FDA  at 1-800-FDA-1088. Where should I keep my medication? This medication is given in a hospital or clinic. It will not be stored at home. NOTE: This sheet is a summary. It may not cover all possible information. If you have questions about this medicine, talk to your doctor, pharmacist, or health care provider.  2024 Elsevier/Gold Standard (2022-04-10 00:00:00)

## 2024-03-04 NOTE — Progress Notes (Signed)
 [facility] OFFICE PROGRESS NOTE   Diagnosis: Pancreas cancer  INTERVAL HISTORY:   David [name] returns as scheduled.  He completed another cycle of FOLFIRI 02/19/2024.  He denies nausea/vomiting.  No mouth sores.  No diarrhea.  He has periodic loose stools.  He wonders if this is due to an increased dose of metformin.  Stable neuropathy symptoms.  He had "constant" hiccups for 3 to 4 days after treatment.  Baclofen was not effective.  The hiccups resolved with Thorazine.  Since then he has soreness/tenderness of abdominal muscles, in particular the right anterolateral abdomen.  No fever, cough, shortness of breath.  Objective:  Vital signs in last 24 hours:  Blood pressure 122/76, pulse 77, temperature 98.1 F (36.7 C), temperature source Temporal, resp. rate 18, height 5\' 8"  (1.727 m), weight 162 lb 11.2 oz (73.8 kg), SpO2 100%.    HEENT: No thrush or ulcers. Resp: Lungs clear bilaterally. Cardio: Regular rate and rhythm. GI: Abdomen soft.  Tender with deep palpation at the right anterolateral abdomen just below the ribs.  No hepatomegaly.  No mass. Vascular: No leg edema. Skin: Palms without erythema. Port-A-Cath without erythema.  Lab Results:  Lab Results  Component Value Date   WBC 4.6 03/04/2024   HGB 12.0 (L) 03/04/2024   HCT 34.9 (L) 03/04/2024   MCV 103.6 (H) 03/04/2024   PLT 137 (L) 03/04/2024   NEUTROABS 3.0 03/04/2024    Imaging:  No results found.  Medications: I have reviewed the patient's current medications.  Assessment/Plan: Pancreas cancer CT angiogram chest 04/24/2023-low-density mass in the dome of the liver with at least 3 additional lesions in both hepatic lobes, borderline lymphadenopathy in the hepatoduodenal ligament Right upper quadrant ultrasound 04/24/2023-large circumscribed mass of the liver dome CT abdomen/pelvis 04/24/2023-multiple liver lesions poorly defined on noncontrast exam, prominent portacaval and porta hepatis nodes MRI  abdomen 05/05/2023-multiple rim-hypoenhancing liver lesions consistent with hepatic metastases, suspicion for a pancreas tail mass Ultrasound-guided biopsy of dominant right liver lesion 05/10/2023-adenocarcinoma, CK7 and CDX2 positive, abundant cytoplasm and mucin with extracellular mucin, foci of lymphovascular invasion Tempus gene panel-K-ras G12V, T P53 mild tumor mutation burden 3.2, MSS PET 06/04/2023-multiple hypermetabolic liver lesions consistent with metastases, low-level FDG uptake in the soft tissue fullness at the tip of the pancreas tail, low-level FDG activity involving a small soft tissue nodule between the gastric fundus and spleen, tiny foci of accumulation at the right costovertebral junction at T10 and roof of the left acetabulum without an underlying CT lesion Elevated CEA and CA 19-9 EUS 06/07/2023 ,22x 15 mm irregular mass in the tail the pancreas, 11 mm subcarinal lymph node, FNA biopsy of the pancreas mass-suspicious for malignancy Cycle 1 FOLFIRINOX 06/19/2023 Cycle 2 FOLFIRINOX 07/03/2023 Cycle 3 FOLFIRINOX 07/17/2023 Cycle 4 FOLFIRINOX 07/31/2023 Cycle 5 FOLFIRINOX 08/14/2023, oxaliplatin and Decadron dose reduced Cycle 6 FOLFIRINOX 08/28/2023 Cycle 7 FOLFIRINOX 09/11/2023 CTs at [facility] 09/16/2023, compared to 04/24/2023-unchanged pancreas tail mass, decrease in attenuation of hepatic metastases, several lesions have decreased in size, no new lesions Cycle 8 FOLFIRINOX 09/25/2023 Cycle 9 FOLFIRINOX 10/09/2023 Cycle 10 FOLFIRINOX 10/22/2023 Cycle 11 FOLFIRINOX 11/05/2023 Cycle 12 FOLFIRINOX 11/20/2023 Cycle 13 FOLFIRINOX 12/04/2023 Cycle 14 FOLFIRINOX 12/19/2023 CTs at [facility] 12/23/2023: Stable to slight decrease in hepatic metastases, no new lesions, stable pancreas tail mass MRI liver at [facility] 12/26/2023: Multifocal nonenhancing hepatic metastases, pancreas tail mass not well-visualized Cycle 15 FOLFIRINOX 01/01/2024, oxaliplatin held secondary to neuropathy Cycle 16 FOLFIRINOX 01/15/2024,  oxaliplatin held secondary to neuropathy Cycle 17  FOLFIRINOX 02/05/2024, 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  Resolved with Reglan.  Recurrent hiccups following cycle 4 FOLFIRINOX-not relieved with Reglan        Disposition: [name] appears stable.  He continues FOLFIRI every 2 weeks.  He is tolerating treatment well.  Most recent CA 19-9 was further improved.  Plan to proceed with another cycle today.  He is scheduled for restaging CTs at [facility] in April.  CBC and chemistry panel reviewed.  Labs adequate to proceed as above.  He had consistent hiccups for about 4 days following the most recent treatment.  The tenderness at the right abdomen may be due to the hiccups.  He understands to contact the office with worsening pain or onset of other symptoms.  He will continue baclofen and/or Thorazine for recurrent hiccups.  He will return for follow-up and treatment as scheduled in 2 weeks.  We are available to see him sooner if needed.    Lonna Cobb ANP/GNP-BC   03/04/2024  9:37 AM

## 2024-03-04 NOTE — Progress Notes (Signed)
 Patient seen by Lonna Cobb NP today  Vitals are within treatment parameters:Yes   Labs are within treatment parameters: Yes   Treatment plan has been signed: Yes   Per physician team, Patient is ready for treatment and there are NO modifications to the treatment plan.

## 2024-03-05 LAB — CANCER ANTIGEN 19-9: CA 19-9: 81 U/mL — ABNORMAL HIGH (ref 0–35)

## 2024-03-06 ENCOUNTER — Inpatient Hospital Stay: Payer: 59

## 2024-03-06 ENCOUNTER — Other Ambulatory Visit: Payer: Self-pay

## 2024-03-06 VITALS — BP 117/71 | HR 84 | Temp 98.0°F | Resp 18

## 2024-03-06 DIAGNOSIS — Z5111 Encounter for antineoplastic chemotherapy: Secondary | ICD-10-CM | POA: Diagnosis not present

## 2024-03-06 DIAGNOSIS — C259 Malignant neoplasm of pancreas, unspecified: Secondary | ICD-10-CM

## 2024-03-06 MED ORDER — HEPARIN SOD (PORK) LOCK FLUSH 100 UNIT/ML IV SOLN
500.0000 [IU] | Freq: Once | INTRAVENOUS | Status: AC | PRN
  Administered 2024-03-06: 500 [IU]

## 2024-03-06 MED ORDER — PEGFILGRASTIM-JMDB 6 MG/0.6ML ~~LOC~~ SOSY
6.0000 mg | PREFILLED_SYRINGE | Freq: Once | SUBCUTANEOUS | Status: AC
  Administered 2024-03-06: 6 mg via SUBCUTANEOUS
  Filled 2024-03-06: qty 0.6

## 2024-03-06 MED ORDER — SODIUM CHLORIDE 0.9% FLUSH
10.0000 mL | INTRAVENOUS | Status: DC | PRN
  Administered 2024-03-06: 10 mL

## 2024-03-06 NOTE — Patient Instructions (Signed)

## 2024-03-12 ENCOUNTER — Other Ambulatory Visit: Payer: Self-pay | Admitting: Oncology

## 2024-03-17 ENCOUNTER — Encounter: Payer: Self-pay | Admitting: Oncology

## 2024-03-18 ENCOUNTER — Inpatient Hospital Stay: Payer: 59 | Attending: Hematology

## 2024-03-18 ENCOUNTER — Inpatient Hospital Stay: Payer: 59

## 2024-03-18 ENCOUNTER — Inpatient Hospital Stay (HOSPITAL_BASED_OUTPATIENT_CLINIC_OR_DEPARTMENT_OTHER): Payer: 59 | Admitting: Oncology

## 2024-03-18 VITALS — BP 120/70 | HR 83 | Temp 97.9°F | Resp 18 | Ht 68.0 in | Wt 156.6 lb

## 2024-03-18 VITALS — BP 123/73 | HR 65

## 2024-03-18 DIAGNOSIS — C259 Malignant neoplasm of pancreas, unspecified: Secondary | ICD-10-CM | POA: Diagnosis not present

## 2024-03-18 DIAGNOSIS — R066 Hiccough: Secondary | ICD-10-CM | POA: Insufficient documentation

## 2024-03-18 DIAGNOSIS — Z5189 Encounter for other specified aftercare: Secondary | ICD-10-CM | POA: Insufficient documentation

## 2024-03-18 DIAGNOSIS — C252 Malignant neoplasm of tail of pancreas: Secondary | ICD-10-CM | POA: Insufficient documentation

## 2024-03-18 DIAGNOSIS — Z5111 Encounter for antineoplastic chemotherapy: Secondary | ICD-10-CM | POA: Insufficient documentation

## 2024-03-18 DIAGNOSIS — C787 Secondary malignant neoplasm of liver and intrahepatic bile duct: Secondary | ICD-10-CM | POA: Insufficient documentation

## 2024-03-18 DIAGNOSIS — G629 Polyneuropathy, unspecified: Secondary | ICD-10-CM | POA: Diagnosis not present

## 2024-03-18 DIAGNOSIS — Z800 Family history of malignant neoplasm of digestive organs: Secondary | ICD-10-CM | POA: Insufficient documentation

## 2024-03-18 DIAGNOSIS — R197 Diarrhea, unspecified: Secondary | ICD-10-CM | POA: Diagnosis not present

## 2024-03-18 LAB — CBC WITH DIFFERENTIAL (CANCER CENTER ONLY)
Abs Immature Granulocytes: 0.08 10*3/uL — ABNORMAL HIGH (ref 0.00–0.07)
Basophils Absolute: 0 10*3/uL (ref 0.0–0.1)
Basophils Relative: 1 %
Eosinophils Absolute: 0.1 10*3/uL (ref 0.0–0.5)
Eosinophils Relative: 2 %
HCT: 34.9 % — ABNORMAL LOW (ref 39.0–52.0)
Hemoglobin: 11.9 g/dL — ABNORMAL LOW (ref 13.0–17.0)
Immature Granulocytes: 1 %
Lymphocytes Relative: 16 %
Lymphs Abs: 1 10*3/uL (ref 0.7–4.0)
MCH: 35.6 pg — ABNORMAL HIGH (ref 26.0–34.0)
MCHC: 34.1 g/dL (ref 30.0–36.0)
MCV: 104.5 fL — ABNORMAL HIGH (ref 80.0–100.0)
Monocytes Absolute: 0.6 10*3/uL (ref 0.1–1.0)
Monocytes Relative: 9 %
Neutro Abs: 4.6 10*3/uL (ref 1.7–7.7)
Neutrophils Relative %: 71 %
Platelet Count: 138 10*3/uL — ABNORMAL LOW (ref 150–400)
RBC: 3.34 MIL/uL — ABNORMAL LOW (ref 4.22–5.81)
RDW: 12.5 % (ref 11.5–15.5)
WBC Count: 6.5 10*3/uL (ref 4.0–10.5)
nRBC: 0 % (ref 0.0–0.2)

## 2024-03-18 LAB — CMP (CANCER CENTER ONLY)
ALT: 29 U/L (ref 0–44)
AST: 22 U/L (ref 15–41)
Albumin: 4.4 g/dL (ref 3.5–5.0)
Alkaline Phosphatase: 186 U/L — ABNORMAL HIGH (ref 38–126)
Anion gap: 10 (ref 5–15)
BUN: 19 mg/dL (ref 6–20)
CO2: 25 mmol/L (ref 22–32)
Calcium: 9.2 mg/dL (ref 8.9–10.3)
Chloride: 102 mmol/L (ref 98–111)
Creatinine: 0.81 mg/dL (ref 0.61–1.24)
GFR, Estimated: >60 mL/min (ref >60)
Glucose, Bld: 185 mg/dL — ABNORMAL HIGH (ref 70–99)
Potassium: 3.9 mmol/L (ref 3.5–5.1)
Sodium: 137 mmol/L (ref 135–145)
Total Bilirubin: 1 mg/dL (ref 0.0–1.2)
Total Protein: 7.1 g/dL (ref 6.5–8.1)

## 2024-03-18 MED ORDER — ATROPINE SULFATE 1 MG/ML IV SOLN
0.5000 mg | Freq: Once | INTRAVENOUS | Status: AC | PRN
  Administered 2024-03-18: 0.5 mg via INTRAVENOUS
  Filled 2024-03-18: qty 1

## 2024-03-18 MED ORDER — SODIUM CHLORIDE 0.9 % IV SOLN
150.0000 mg/m2 | Freq: Once | INTRAVENOUS | Status: AC
  Administered 2024-03-18: 300 mg via INTRAVENOUS
  Filled 2024-03-18: qty 15

## 2024-03-18 MED ORDER — SODIUM CHLORIDE 0.9 % IV SOLN
400.0000 mg/m2 | Freq: Once | INTRAVENOUS | Status: AC
  Administered 2024-03-18: 744 mg via INTRAVENOUS
  Filled 2024-03-18: qty 25

## 2024-03-18 MED ORDER — FOSAPREPITANT DIMEGLUMINE INJECTION 150 MG
150.0000 mg | Freq: Once | INTRAVENOUS | Status: AC
  Administered 2024-03-18: 150 mg via INTRAVENOUS
  Filled 2024-03-18: qty 150

## 2024-03-18 MED ORDER — SODIUM CHLORIDE 0.9 % IV SOLN
2400.0000 mg/m2 | INTRAVENOUS | Status: DC
  Administered 2024-03-18: 4450 mg via INTRAVENOUS
  Filled 2024-03-18: qty 89

## 2024-03-18 MED ORDER — SODIUM CHLORIDE 0.9 % IV SOLN: INTRAVENOUS | Status: DC

## 2024-03-18 MED ORDER — DEXAMETHASONE SODIUM PHOSPHATE 10 MG/ML IJ SOLN
5.0000 mg | Freq: Once | INTRAMUSCULAR | Status: AC
Start: 2024-03-18 — End: 2024-03-18
  Administered 2024-03-18: 5 mg via INTRAVENOUS
  Filled 2024-03-18: qty 1

## 2024-03-18 MED ORDER — PALONOSETRON HCL INJECTION 0.25 MG/5ML
0.2500 mg | Freq: Once | INTRAVENOUS | Status: AC
  Administered 2024-03-18: 0.25 mg via INTRAVENOUS
  Filled 2024-03-18: qty 5

## 2024-03-18 NOTE — Patient Instructions (Addendum)
 [facility]   Discharge Instructions: The chemotherapy medication bag should finish at 46 hours, 96 hours, or 7 days. For example, if your pump is scheduled for 46 hours and it was put on at 4:00 p.m., it should finish at 2:00 p.m. the day it is scheduled to come off regardless of your appointment time.     Estimated time to finish at 11:15 FRIDAY, March 20, 2024.   If the display on your pump reads "Low Volume" and it is beeping, take the batteries out of the pump and come to the cancer center for it to be taken off.   If the pump alarms go off prior to the pump reading "Low Volume" then call [phone] and someone can assist you.  If the plunger comes out and the chemotherapy medication is leaking out, please use your home chemo spill kit to clean up the spill. Do NOT use paper towels or other household products.  If you have problems or questions regarding your pump, please call either [phone] (24 hours a day) or the cancer center Monday-Friday 8:00 a.m.- 4:30 p.m. at the clinic number and we will assist you. If you are unable to get assistance, then go to the nearest Emergency Department and ask the staff to contact the IV team for assistance.   Thank you for choosing [facility] to provide your oncology and hematology care.   If you have a lab appointment with the Cancer Center, please go directly to the Cancer Center and check in at the registration area.   Wear comfortable clothing and clothing appropriate for easy access to any Portacath or PICC line.   We strive to give you quality time with your provider. You may need to reschedule your appointment if you arrive late (15 or more minutes).  Arriving late affects you and other patients whose appointments are after yours.  Also, if you miss three or more appointments without notifying the office, you may be dismissed from the clinic at the provider's  discretion.      For prescription refill requests, have your pharmacy contact our office and allow 72 hours for refills to be completed.    Today you received the following chemotherapy and/or immunotherapy agents IRINOTECAN/LEUCOVORIN/FLUOROURACIL      To help prevent nausea and vomiting after your treatment, we encourage you to take your nausea medication as directed.  BELOW ARE SYMPTOMS THAT SHOULD BE REPORTED IMMEDIATELY: *FEVER GREATER THAN 100.4 F (38 C) OR HIGHER *CHILLS OR SWEATING *NAUSEA AND VOMITING THAT IS NOT CONTROLLED WITH YOUR NAUSEA MEDICATION *UNUSUAL SHORTNESS OF BREATH *UNUSUAL BRUISING OR BLEEDING *URINARY PROBLEMS (pain or burning when urinating, or frequent urination) *BOWEL PROBLEMS (unusual diarrhea, constipation, pain near the anus) TENDERNESS IN MOUTH AND THROAT WITH OR WITHOUT PRESENCE OF ULCERS (sore throat, sores in mouth, or a toothache) UNUSUAL RASH, SWELLING OR PAIN  UNUSUAL VAGINAL DISCHARGE OR ITCHING   Items with * indicate a potential emergency and should be followed up as soon as possible or go to the Emergency Department if any problems should occur.  Please show the CHEMOTHERAPY ALERT CARD or IMMUNOTHERAPY ALERT CARD at check-in to the Emergency Department and triage nurse.  Should you have questions after your visit or need to cancel or reschedule your appointment, please contact [facility]  Dept: [phone]  and follow the prompts.  Office hours  are 8:00 a.m. to 4:30 p.m. Monday - Friday. Please note that voicemails left after 4:00 p.m. may not be returned until the following business day.  We are closed weekends and major holidays. You have access to a nurse at all times for urgent questions. Please call the main number to the clinic Dept: [phone] and follow the prompts.   For any non-urgent questions, you may also contact your provider using MyChart. We now offer e-Visits for  anyone 61 and older to request care online for non-urgent symptoms. For details visit [email or web address].   Also download the MyChart app! Go to the app store, search "MyChart", open the app, select [facility], and log in with your MyChart username and password.  Irinotecan Injection What is this medication? IRINOTECAN (ir in oh TEE kan) treats some types of cancer. It works by slowing down the growth of cancer cells. This medicine may be used for other purposes; ask your health care provider or pharmacist if you have questions. COMMON BRAND NAME(S): Camptosar What should I tell my care team before I take this medication? They need to know if you have any of these conditions: Dehydration Diarrhea Infection, especially a viral infection, such as chickenpox, cold sores, herpes Liver disease Low blood cell levels (white cells, red cells, and platelets) Low levels of electrolytes, such as calcium, magnesium, or potassium in your blood Recent or ongoing radiation An unusual or allergic reaction to irinotecan, other medications, foods, dyes, or preservatives If you or your partner are pregnant or trying to get pregnant Breast-feeding How should I use this medication? This medication is injected into a vein. It is given by your care team in a hospital or clinic setting. Talk to your care team about the use of this medication in children. Special care may be needed. Overdosage: If you think you have taken too much of this medicine contact a poison control center or emergency room at once. NOTE: This medicine is only for you. Do not share this medicine with others. What if I miss a dose? Keep appointments for follow-up doses. It is important not to miss your dose. Call your care team if you are unable to keep an appointment. What may interact with this medication? Do not take this medication with any of the following: Cobicistat Itraconazole This medication may also interact with the  following: Certain antibiotics, such as clarithromycin, rifampin, rifabutin Certain antivirals for HIV or AIDS Certain medications for fungal infections, such as ketoconazole, posaconazole, voriconazole Certain medications for seizures, such as carbamazepine, phenobarbital, phenytoin Gemfibrozil Nefazodone St. John's wort This list may not describe all possible interactions. Give your health care provider a list of all the medicines, herbs, non-prescription drugs, or dietary supplements you use. Also tell them if you smoke, drink alcohol, or use illegal drugs. Some items may interact with your medicine. What should I watch for while using this medication? Your condition will be monitored carefully while you are receiving this medication. You may need blood work while taking this medication. This medication may make you feel generally unwell. This is not uncommon as chemotherapy can affect healthy cells as well as cancer cells. Report any side effects. Continue your course of treatment even though you feel ill unless your care team tells you to stop. This medication can cause serious side effects. To reduce the risk, your care team may give you other medications to take before receiving this one. Be sure to follow the directions from your care team. This medication may  affect your coordination, reaction time, or judgement. Do not drive or operate machinery until you know how this medication affects you. Sit up or stand slowly to reduce the risk of dizzy or fainting spells. Drinking alcohol with this medication can increase the risk of these side effects. This medication may increase your risk of getting an infection. Call your care team for advice if you get a fever, chills, sore throat, or other symptoms of a cold or flu. Do not treat yourself. Try to avoid being around people who are sick. Avoid taking medications that contain aspirin, acetaminophen, ibuprofen, naproxen, or ketoprofen unless  instructed by your care team. These medications may hide a fever. This medication may increase your risk to bruise or bleed. Call your care team if you notice any unusual bleeding. Be careful brushing or flossing your teeth or using a toothpick because you may get an infection or bleed more easily. If you have any dental work done, tell your dentist you are receiving this medication. Talk to your care team if you or your partner are pregnant or think either of you might be pregnant. This medication can cause serious birth defects if taken during pregnancy and for 6 months after the last dose. You will need a negative pregnancy test before starting this medication. Contraception is recommended while taking this medication and for 6 months after the last dose. Your care team can help you find the option that works for you. Do not father a child while taking this medication and for 3 months after the last dose. Use a condom for contraception during this time period. Do not breastfeed while taking this medication and for 7 days after the last dose. This medication may cause infertility. Talk to your care team if you are concerned about your fertility. What side effects may I notice from receiving this medication? Side effects that you should report to your care team as soon as possible: Allergic reactions--skin rash, itching, hives, swelling of the face, lips, tongue, or throat Dry cough, shortness of breath or trouble breathing Increased saliva or tears, increased sweating, stomach cramping, diarrhea, small pupils, unusual weakness or fatigue, slow heartbeat Infection--fever, chills, cough, sore throat, wounds that don't heal, pain or trouble when passing urine, general feeling of discomfort or being unwell Kidney injury--decrease in the amount of urine, swelling of the ankles, hands, or feet Low red blood cell level--unusual weakness or fatigue, dizziness, headache, trouble breathing Severe or prolonged  diarrhea Unusual bruising or bleeding Side effects that usually do not require medical attention (report to your care team if they continue or are bothersome): Constipation Diarrhea Hair loss Loss of appetite Nausea Stomach pain This list may not describe all possible side effects. Call your doctor for medical advice about side effects. You may report side effects to FDA at 1-800-FDA-1088. Where should I keep my medication? This medication is given in a hospital or clinic. It will not be stored at home. NOTE: This sheet is a summary. It may not cover all possible information. If you have questions about this medicine, talk to your doctor, pharmacist, or health care provider.  2024 Elsevier/Gold Standard (2022-04-16 00:00:00) Leucovorin Injection What is this medication? LEUCOVORIN (loo koe VOR in) prevents side effects from certain medications, such as methotrexate. It works by increasing folate levels. This helps protect healthy cells in your body. It may also be used to treat anemia caused by low levels of folate. It can also be used with fluorouracil, a type of  chemotherapy, to treat colorectal cancer. It works by increasing the effects of fluorouracil in the body. This medicine may be used for other purposes; ask your health care provider or pharmacist if you have questions. What should I tell my care team before I take this medication? They need to know if you have any of these conditions: Anemia from low levels of vitamin B12 in the blood An unusual or allergic reaction to leucovorin, folic acid, other medications, foods, dyes, or preservatives Pregnant or trying to get pregnant Breastfeeding How should I use this medication? This medication is injected into a vein or a muscle. It is given by your care team in a hospital or clinic setting. Talk to your care team about the use of this medication in children. Special care may be needed. Overdosage: If you think you have taken too much  of this medicine contact a poison control center or emergency room at once. NOTE: This medicine is only for you. Do not share this medicine with others. What if I miss a dose? Keep appointments for follow-up doses. It is important not to miss your dose. Call your care team if you are unable to keep an appointment. What may interact with this medication? Capecitabine Fluorouracil Phenobarbital Phenytoin Primidone Trimethoprim;sulfamethoxazole This list may not describe all possible interactions. Give your health care provider a list of all the medicines, herbs, non-prescription drugs, or dietary supplements you use. Also tell them if you smoke, drink alcohol, or use illegal drugs. Some items may interact with your medicine. What should I watch for while using this medication? Your condition will be monitored carefully while you are receiving this medication. This medication may increase the side effects of 5-fluorouracil. Tell your care team if you have diarrhea or mouth sores that do not get better or that get worse. What side effects may I notice from receiving this medication? Side effects that you should report to your care team as soon as possible: Allergic reactions--skin rash, itching, hives, swelling of the face, lips, tongue, or throat This list may not describe all possible side effects. Call your doctor for medical advice about side effects. You may report side effects to FDA at 1-800-FDA-1088. Where should I keep my medication? This medication is given in a hospital or clinic. It will not be stored at home. NOTE: This sheet is a summary. It may not cover all possible information. If you have questions about this medicine, talk to your doctor, pharmacist, or health care provider.  2024 Elsevier/Gold Standard (2022-05-08 00:00:00)  Fluorouracil Injection What is this medication? FLUOROURACIL (flure oh YOOR a sil) treats some types of cancer. It works by slowing down the growth of  cancer cells. This medicine may be used for other purposes; ask your health care provider or pharmacist if you have questions. COMMON BRAND NAME(S): Adrucil What should I tell my care team before I take this medication? They need to know if you have any of these conditions: Blood disorders Dihydropyrimidine dehydrogenase (DPD) deficiency Infection, such as chickenpox, cold sores, herpes Kidney disease Liver disease Poor nutrition Recent or ongoing radiation therapy An unusual or allergic reaction to fluorouracil, other medications, foods, dyes, or preservatives If you or your partner are pregnant or trying to get pregnant Breast-feeding How should I use this medication? This medication is injected into a vein. It is administered by your care team in a hospital or clinic setting. Talk to your care team about the use of this medication in children. Special care  may be needed. Overdosage: If you think you have taken too much of this medicine contact a poison control center or emergency room at once. NOTE: This medicine is only for you. Do not share this medicine with others. What if I miss a dose? Keep appointments for follow-up doses. It is important not to miss your dose. Call your care team if you are unable to keep an appointment. What may interact with this medication? Do not take this medication with any of the following: Live virus vaccines This medication may also interact with the following: Medications that treat or prevent blood clots, such as warfarin, enoxaparin, dalteparin This list may not describe all possible interactions. Give your health care provider a list of all the medicines, herbs, non-prescription drugs, or dietary supplements you use. Also tell them if you smoke, drink alcohol, or use illegal drugs. Some items may interact with your medicine. What should I watch for while using this medication? Your condition will be monitored carefully while you are receiving this  medication. This medication may make you feel generally unwell. This is not uncommon as chemotherapy can affect healthy cells as well as cancer cells. Report any side effects. Continue your course of treatment even though you feel ill unless your care team tells you to stop. In some cases, you may be given additional medications to help with side effects. Follow all directions for their use. This medication may increase your risk of getting an infection. Call your care team for advice if you get a fever, chills, sore throat, or other symptoms of a cold or flu. Do not treat yourself. Try to avoid being around people who are sick. This medication may increase your risk to bruise or bleed. Call your care team if you notice any unusual bleeding. Be careful brushing or flossing your teeth or using a toothpick because you may get an infection or bleed more easily. If you have any dental work done, tell your dentist you are receiving this medication. Avoid taking medications that contain aspirin, acetaminophen, ibuprofen, naproxen, or ketoprofen unless instructed by your care team. These medications may hide a fever. Do not treat diarrhea with over the counter products. Contact your care team if you have diarrhea that lasts more than 2 days or if it is severe and watery. This medication can make you more sensitive to the sun. Keep out of the sun. If you cannot avoid being in the sun, wear protective clothing and sunscreen. Do not use sun lamps, tanning beds, or tanning booths. Talk to your care team if you or your partner wish to become pregnant or think you might be pregnant. This medication can cause serious birth defects if taken during pregnancy and for 3 months after the last dose. A reliable form of contraception is recommended while taking this medication and for 3 months after the last dose. Talk to your care team about effective forms of contraception. Do not father a child while taking this medication  and for 3 months after the last dose. Use a condom while having sex during this time period. Do not breastfeed while taking this medication. This medication may cause infertility. Talk to your care team if you are concerned about your fertility. What side effects may I notice from receiving this medication? Side effects that you should report to your care team as soon as possible: Allergic reactions--skin rash, itching, hives, swelling of the face, lips, tongue, or throat Heart attack--pain or tightness in the chest, shoulders, arms,  or jaw, nausea, shortness of breath, cold or clammy skin, feeling faint or lightheaded Heart failure--shortness of breath, swelling of the ankles, feet, or hands, sudden weight gain, unusual weakness or fatigue Heart rhythm changes--fast or irregular heartbeat, dizziness, feeling faint or lightheaded, chest pain, trouble breathing High ammonia level--unusual weakness or fatigue, confusion, loss of appetite, nausea, vomiting, seizures Infection--fever, chills, cough, sore throat, wounds that don't heal, pain or trouble when passing urine, general feeling of discomfort or being unwell Low red blood cell level--unusual weakness or fatigue, dizziness, headache, trouble breathing Pain, tingling, or numbness in the hands or feet, muscle weakness, change in vision, confusion or trouble speaking, loss of balance or coordination, trouble walking, seizures Redness, swelling, and blistering of the skin over hands and feet Severe or prolonged diarrhea Unusual bruising or bleeding Side effects that usually do not require medical attention (report to your care team if they continue or are bothersome): Dry skin Headache Increased tears Nausea Pain, redness, or swelling with sores inside the mouth or throat Sensitivity to light Vomiting This list may not describe all possible side effects. Call your doctor for medical advice about side effects. You may report side effects to FDA  at 1-800-FDA-1088. Where should I keep my medication? This medication is given in a hospital or clinic. It will not be stored at home. NOTE: This sheet is a summary. It may not cover all possible information. If you have questions about this medicine, talk to your doctor, pharmacist, or health care provider.  2024 Elsevier/Gold Standard (2022-04-10 00:00:00)

## 2024-03-18 NOTE — Progress Notes (Signed)
 Patient seen by Dr. Thornton Papas today  Vitals are within treatment parameters:Yes   Labs are within treatment parameters: Yes   Treatment plan has been signed: Yes   Per physician team, Patient is ready for treatment and there are NO modifications to the treatment plan.

## 2024-03-18 NOTE — Progress Notes (Signed)
 [facility] OFFICE PROGRESS NOTE   Diagnosis: Pancreas cancer  INTERVAL HISTORY:   David [name] completed another cycle of FOLFIRI on 03/04/2024.  No nausea/vomiting, mouth sores, or diarrhea.  He reports approximately 2 loose stools daily since beginning metformin at an increased dose.  He has discomfort at the right anterior costal margin for the past few weeks.  He had hiccups on 1 day following the most recent chemotherapy.  He reports persistent neuropathy symptoms in the extremities.  Objective:  Vital signs in last 24 hours:  Blood pressure 120/70, pulse 83, temperature 97.9 F (36.6 C), temperature source Oral, resp. rate 18, height 5\' 8"  (1.727 m), weight 156 lb 9.6 oz (71 kg), SpO2 98%.    HEENT: No thrush or ulcers Resp: Lungs clear bilaterally Cardio: Regular rate and rhythm GI: No hepatosplenomegaly, no mass, mild tenderness at the lateral right costal margin Vascular: No leg edema   Portacath/PICC-without erythema  Lab Results:  Lab Results  Component Value Date   WBC 6.5 03/18/2024   HGB 11.9 (L) 03/18/2024   HCT 34.9 (L) 03/18/2024   MCV 104.5 (H) 03/18/2024   PLT 138 (L) 03/18/2024   NEUTROABS 4.6 03/18/2024    CMP  Lab Results  Component Value Date   NA 137 03/18/2024   K 3.9 03/18/2024   CL 102 03/18/2024   CO2 25 03/18/2024   GLUCOSE 185 (H) 03/18/2024   BUN 19 03/18/2024   CREATININE 0.81 03/18/2024   CALCIUM 9.2 03/18/2024   PROT 7.1 03/18/2024   ALBUMIN 4.4 03/18/2024   AST 22 03/18/2024   ALT 29 03/18/2024   ALKPHOS 186 (H) 03/18/2024   BILITOT 1.0 03/18/2024   GFRNONAA >60 03/18/2024   GFRAA >60 03/26/2018    Lab Results  Component Value Date   CEA 772.45 (H) 05/02/2023   HYQ657 81 (H) 03/04/2024     Medications: I have reviewed the patient's current medications.   Assessment/Plan: Pancreas cancer CT angiogram chest 04/24/2023-low-density mass in the dome of the liver with at least 3 additional lesions in both  hepatic lobes, borderline lymphadenopathy in the hepatoduodenal ligament Right upper quadrant ultrasound 04/24/2023-large circumscribed mass of the liver dome CT abdomen/pelvis 04/24/2023-multiple liver lesions poorly defined on noncontrast exam, prominent portacaval and porta hepatis nodes MRI abdomen 05/05/2023-multiple rim-hypoenhancing liver lesions consistent with hepatic metastases, suspicion for a pancreas tail mass Ultrasound-guided biopsy of dominant right liver lesion 05/10/2023-adenocarcinoma, CK7 and CDX2 positive, abundant cytoplasm and mucin with extracellular mucin, foci of lymphovascular invasion Tempus gene panel-K-ras G12V, T P53 mild tumor mutation burden 3.2, MSS PET 06/04/2023-multiple hypermetabolic liver lesions consistent with metastases, low-level FDG uptake in the soft tissue fullness at the tip of the pancreas tail, low-level FDG activity involving a small soft tissue nodule between the gastric fundus and spleen, tiny foci of accumulation at the right costovertebral junction at T10 and roof of the left acetabulum without an underlying CT lesion Elevated CEA and CA 19-9 EUS 06/07/2023 ,22x 15 mm irregular mass in the tail the pancreas, 11 mm subcarinal lymph node, FNA biopsy of the pancreas mass-suspicious for malignancy Cycle 1 FOLFIRINOX 06/19/2023 Cycle 2 FOLFIRINOX 07/03/2023 Cycle 3 FOLFIRINOX 07/17/2023 Cycle 4 FOLFIRINOX 07/31/2023 Cycle 5 FOLFIRINOX 08/14/2023, oxaliplatin and Decadron dose reduced Cycle 6 FOLFIRINOX 08/28/2023 Cycle 7 FOLFIRINOX 09/11/2023 CTs at [facility] 09/16/2023, compared to 04/24/2023-unchanged pancreas tail mass, decrease in attenuation of hepatic metastases, several lesions have decreased in size, no new lesions Cycle 8 FOLFIRINOX 09/25/2023 Cycle 9 FOLFIRINOX 10/09/2023 Cycle  10 FOLFIRINOX 10/22/2023 Cycle 11 FOLFIRINOX 11/05/2023 Cycle 12 FOLFIRINOX 11/20/2023 Cycle 13 FOLFIRINOX 12/04/2023 Cycle 14 FOLFIRINOX 12/19/2023 CT[facility] at [facility] 12/23/2023: Stable to slight  decrease in hepatic metastases, no new lesions, stable pancreas tail mass MRI liver at [facility] 12/26/2023: Multifocal nonenhancing hepatic metastases, pancreas tail mass not well-visualized Cycle 15 FOLFIRINOX 01/01/2024, oxaliplatin held secondary to neuropathy Cycle 16 FOLFIRINOX 01/15/2024, oxaliplatin held secondary to neuropathy Cycle 17 FOLFIRINOX 02/05/2024, oxaliplatin held secondary to neuropathy Cycle 18 FOLFIRINOX 02/19/2024, oxaliplatin held secondary to neuropathy Cycle 19 FOLFIRINOX 03/04/2024, oxaliplatin held secondary to neuropathy Cycle 20 FOLFIRINOX 03/18/2024, 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  Resolved with Reglan.  Recurrent hiccups following cycle 4 FOLFIRINOX-not relieved with Reglan         Disposition: Mr. Aston appears stable.  He will complete another cycle of chemotherapy today.  The CA 19-9 was lower on 03/04/2024.  He is scheduled undergo restaging CT[facility] at [facility] next week.  The right costal margin discomfort is most likely related to a benign musculoskeletal condition.  He will return for an office visit in 2 weeks.  Thornton Papas, MD  03/18/2024  10:04 AM

## 2024-03-19 LAB — CANCER ANTIGEN 19-9: CA 19-9: 72 U/mL — ABNORMAL HIGH (ref 0–35)

## 2024-03-20 ENCOUNTER — Inpatient Hospital Stay: Payer: 59

## 2024-03-20 ENCOUNTER — Telehealth: Payer: Self-pay

## 2024-03-20 VITALS — BP 133/78 | HR 83 | Temp 98.3°F | Resp 18

## 2024-03-20 DIAGNOSIS — Z5111 Encounter for antineoplastic chemotherapy: Secondary | ICD-10-CM | POA: Diagnosis not present

## 2024-03-20 DIAGNOSIS — C259 Malignant neoplasm of pancreas, unspecified: Secondary | ICD-10-CM

## 2024-03-20 MED ORDER — HEPARIN SOD (PORK) LOCK FLUSH 100 UNIT/ML IV SOLN
500.0000 [IU] | Freq: Once | INTRAVENOUS | Status: AC | PRN
  Administered 2024-03-20: 500 [IU]

## 2024-03-20 MED ORDER — PEGFILGRASTIM-JMDB 6 MG/0.6ML ~~LOC~~ SOSY
6.0000 mg | PREFILLED_SYRINGE | Freq: Once | SUBCUTANEOUS | Status: AC
  Administered 2024-03-20: 6 mg via SUBCUTANEOUS

## 2024-03-20 MED ORDER — SODIUM CHLORIDE 0.9% FLUSH
10.0000 mL | INTRAVENOUS | Status: DC | PRN
  Administered 2024-03-20: 10 mL

## 2024-03-20 NOTE — Progress Notes (Signed)
 Patient presents today for Pump Stop and Fulphila injection. Patient's port flushed without difficulty.  Good blood return noted with no bruising or swelling noted at site.  Band aid applied.   Patient tolerated injection in left arm with no complaints voiced.  Site clean and dry with no bruising or swelling noted.  No complaints of pain.  Discharged with vital signs stable and no signs or symptoms of distress noted.

## 2024-03-20 NOTE — Telephone Encounter (Signed)
-----   Message from Lonna Cobb sent at 03/20/2024  1:04 PM EDT ----- Please let him know the CA 19-9 tumor marker was lower.  Follow-up as scheduled.

## 2024-03-20 NOTE — Patient Instructions (Addendum)
 [facility]  Discharge Instructions: Thank you for choosing [facility] to provide your oncology and hematology care.   If you have a lab appointment with the Cancer Center, please go directly to the Cancer Center and check in at the registration area.   Wear comfortable clothing and clothing appropriate for easy access to any Portacath or PICC line.   We strive to give you quality time with your provider. You may need to reschedule your appointment if you arrive late (15 or more minutes).  Arriving late affects you and other patients whose appointments are after yours.  Also, if you miss three or more appointments without notifying the office, you may be dismissed from the clinic at the provider's discretion.      For prescription refill requests, have your pharmacy contact our office and allow 72 hours for refills to be completed.    Today you received the following chemotherapy and/or immunotherapy agents Fulphila.  Pegfilgrastim Injection What is this medication? PEGFILGRASTIM (PEG fil gra stim) lowers the risk of infection in people who are receiving chemotherapy. It works by Systems analyst make more white blood cells, which protects your body from infection. It may also be used to help people who have been exposed to high doses of radiation. This medicine may be used for other purposes; ask your health care provider or pharmacist if you have questions. COMMON BRAND NAME(S): Cherly Hensen, Neulasta, Nyvepria, Stimufend, UDENYCA, UDENYCA ONBODY, Ziextenzo What should I tell my care team before I take this medication? They need to know if you have any of these conditions: Kidney disease Latex allergy Ongoing radiation therapy Sickle cell disease Skin reactions to acrylic adhesives (On-Body Injector only) An unusual or allergic reaction to pegfilgrastim, filgrastim, other medications, foods, dyes, or  preservatives Pregnant or trying to get pregnant Breast-feeding How should I use this medication? This medication is for injection under the skin. If you get this medication at home, you will be taught how to prepare and give the pre-filled syringe or how to use the On-body Injector. Refer to the patient Instructions for Use for detailed instructions. Use exactly as directed. Tell your care team immediately if you suspect that the On-body Injector may not have performed as intended or if you suspect the use of the On-body Injector resulted in a missed or partial dose. It is important that you put your used needles and syringes in a special sharps container. Do not put them in a trash can. If you do not have a sharps container, call your pharmacist or care team to get one. Talk to your care team about the use of this medication in children. While this medication may be prescribed for selected conditions, precautions do apply. Overdosage: If you think you have taken too much of this medicine contact a poison control center or emergency room at once. NOTE: This medicine is only for you. Do not share this medicine with others. What if I miss a dose? It is important not to miss your dose. Call your care team if you miss your dose. If you miss a dose due to an On-body Injector failure or leakage, a new dose should be administered as soon as possible using a single prefilled syringe for manual use. What may interact with this medication? Interactions have not been studied. This list may not describe all possible interactions. Give your health care provider a list of all the  medicines, herbs, non-prescription drugs, or dietary supplements you use. Also tell them if you smoke, drink alcohol, or use illegal drugs. Some items may interact with your medicine. What should I watch for while using this medication? Your condition will be monitored carefully while you are receiving this medication. You may need blood  work done while you are taking this medication. Talk to your care team about your risk of cancer. You may be more at risk for certain types of cancer if you take this medication. If you are going to need a MRI, CT scan, or other procedure, tell your care team that you are using this medication (On-Body Injector only). What side effects may I notice from receiving this medication? Side effects that you should report to your care team as soon as possible: Allergic reactions--skin rash, itching, hives, swelling of the face, lips, tongue, or throat Capillary leak syndrome--stomach or muscle pain, unusual weakness or fatigue, feeling faint or lightheaded, decrease in the amount of urine, swelling of the ankles, hands, or feet, trouble breathing High white blood cell level--fever, fatigue, trouble breathing, night sweats, change in vision, weight loss Inflammation of the aorta--fever, fatigue, back, chest, or stomach pain, severe headache Kidney injury (glomerulonephritis)--decrease in the amount of urine, red or dark brown urine, foamy or bubbly urine, swelling of the ankles, hands, or feet Shortness of breath or trouble breathing Spleen injury--pain in upper left stomach or shoulder Unusual bruising or bleeding Side effects that usually do not require medical attention (report to your care team if they continue or are bothersome): Bone pain Pain in the hands or feet This list may not describe all possible side effects. Call your doctor for medical advice about side effects. You may report side effects to FDA at 1-800-FDA-1088. Where should I keep my medication? Keep out of the reach of children. If you are using this medication at home, you will be instructed on how to store it. Throw away any unused medication after the expiration date on the label. NOTE: This sheet is a summary. It may not cover all possible information. If you have questions about this medicine, talk to your doctor, pharmacist, or  health care provider.  2024 Elsevier/Gold Standard (2021-11-03 00:00:00)      To help prevent nausea and vomiting after your treatment, we encourage you to take your nausea medication as directed.  BELOW ARE SYMPTOMS THAT SHOULD BE REPORTED IMMEDIATELY: *FEVER GREATER THAN 100.4 F (38 C) OR HIGHER *CHILLS OR SWEATING *NAUSEA AND VOMITING THAT IS NOT CONTROLLED WITH YOUR NAUSEA MEDICATION *UNUSUAL SHORTNESS OF BREATH *UNUSUAL BRUISING OR BLEEDING *URINARY PROBLEMS (pain or burning when urinating, or frequent urination) *BOWEL PROBLEMS (unusual diarrhea, constipation, pain near the anus) TENDERNESS IN MOUTH AND THROAT WITH OR WITHOUT PRESENCE OF ULCERS (sore throat, sores in mouth, or a toothache) UNUSUAL RASH, SWELLING OR PAIN  UNUSUAL VAGINAL DISCHARGE OR ITCHING   Items with * indicate a potential emergency and should be followed up as soon as possible or go to the Emergency Department if any problems should occur.  Please show the CHEMOTHERAPY ALERT CARD or IMMUNOTHERAPY ALERT CARD at check-in to the Emergency Department and triage nurse.  Should you have questions after your visit or need to cancel or reschedule your appointment, please contact [facility]  Dept: [phone]  and follow the prompts.  Office hours are 8:00 a.m. to 4:30 p.m. Monday - Friday. Please note that voicemails left  after 4:00 p.m. may not be returned until the following business day.  We are closed weekends and major holidays. You have access to a nurse at all times for urgent questions. Please call the main number to the clinic Dept: [phone] and follow the prompts.   For any non-urgent questions, you may also contact your provider using MyChart. We now offer e-Visits for anyone 15 and older to request care online for non-urgent symptoms. For details visit [email or web address].   Also download the MyChart app! Go to the app store, search "MyChart",  open the app, select [place], and log in with your MyChart username and password.

## 2024-03-20 NOTE — Telephone Encounter (Signed)
 Patient gave verbal understanding and had no further questions or concerns

## 2024-03-29 ENCOUNTER — Other Ambulatory Visit: Payer: Self-pay | Admitting: Oncology

## 2024-04-01 ENCOUNTER — Inpatient Hospital Stay

## 2024-04-01 ENCOUNTER — Encounter: Payer: Self-pay | Admitting: Oncology

## 2024-04-01 ENCOUNTER — Other Ambulatory Visit: Payer: Self-pay | Admitting: *Deleted

## 2024-04-01 ENCOUNTER — Inpatient Hospital Stay (HOSPITAL_BASED_OUTPATIENT_CLINIC_OR_DEPARTMENT_OTHER): Admitting: Oncology

## 2024-04-01 ENCOUNTER — Inpatient Hospital Stay
Admission: RE | Admit: 2024-04-01 | Discharge: 2024-04-01 | Disposition: A | Discharge status: Home / Self Care | Payer: Self-pay | Source: Ambulatory Visit | Attending: Oncology | Admitting: Oncology

## 2024-04-01 VITALS — BP 113/67 | HR 65

## 2024-04-01 VITALS — BP 119/69 | HR 73 | Temp 98.2°F | Resp 18 | Ht 68.0 in | Wt 158.4 lb

## 2024-04-01 DIAGNOSIS — C259 Malignant neoplasm of pancreas, unspecified: Secondary | ICD-10-CM

## 2024-04-01 DIAGNOSIS — C787 Secondary malignant neoplasm of liver and intrahepatic bile duct: Secondary | ICD-10-CM | POA: Diagnosis not present

## 2024-04-01 DIAGNOSIS — Z5111 Encounter for antineoplastic chemotherapy: Secondary | ICD-10-CM | POA: Diagnosis not present

## 2024-04-01 LAB — CMP (CANCER CENTER ONLY)
ALT: 32 U/L (ref 0–44)
AST: 23 U/L (ref 15–41)
Albumin: 4.2 g/dL (ref 3.5–5.0)
Alkaline Phosphatase: 175 U/L — ABNORMAL HIGH (ref 38–126)
Anion gap: 7 (ref 5–15)
BUN: 21 mg/dL — ABNORMAL HIGH (ref 6–20)
CO2: 26 mmol/L (ref 22–32)
Calcium: 10 mg/dL (ref 8.9–10.3)
Chloride: 103 mmol/L (ref 98–111)
Creatinine: 0.79 mg/dL (ref 0.61–1.24)
GFR, Estimated: >60 mL/min
Glucose, Bld: 176 mg/dL — ABNORMAL HIGH (ref 70–99)
Potassium: 4.4 mmol/L (ref 3.5–5.1)
Sodium: 136 mmol/L (ref 135–145)
Total Bilirubin: 0.7 mg/dL (ref 0.0–1.2)
Total Protein: 7.1 g/dL (ref 6.5–8.1)

## 2024-04-01 LAB — CBC WITH DIFFERENTIAL (CANCER CENTER ONLY)
Abs Immature Granulocytes: 0.08 10*3/uL — ABNORMAL HIGH (ref 0.00–0.07)
Basophils Absolute: 0 10*3/uL (ref 0.0–0.1)
Basophils Relative: 1 %
Eosinophils Absolute: 0.1 10*3/uL (ref 0.0–0.5)
Eosinophils Relative: 2 %
HCT: 34.5 % — ABNORMAL LOW (ref 39.0–52.0)
Hemoglobin: 11.6 g/dL — ABNORMAL LOW (ref 13.0–17.0)
Immature Granulocytes: 2 %
Lymphocytes Relative: 18 %
Lymphs Abs: 0.8 10*3/uL (ref 0.7–4.0)
MCH: 35 pg — ABNORMAL HIGH (ref 26.0–34.0)
MCHC: 33.6 g/dL (ref 30.0–36.0)
MCV: 104.2 fL — ABNORMAL HIGH (ref 80.0–100.0)
Monocytes Absolute: 0.5 10*3/uL (ref 0.1–1.0)
Monocytes Relative: 12 %
Neutro Abs: 3.1 10*3/uL (ref 1.7–7.7)
Neutrophils Relative %: 65 %
Platelet Count: 148 10*3/uL — ABNORMAL LOW (ref 150–400)
RBC: 3.31 MIL/uL — ABNORMAL LOW (ref 4.22–5.81)
RDW: 12.9 % (ref 11.5–15.5)
WBC Count: 4.7 10*3/uL (ref 4.0–10.5)
nRBC: 0 % (ref 0.0–0.2)

## 2024-04-01 MED ORDER — SODIUM CHLORIDE 0.9 % IV SOLN: INTRAVENOUS | Status: DC

## 2024-04-01 MED ORDER — ATROPINE SULFATE 1 MG/ML IV SOLN
0.5000 mg | Freq: Once | INTRAVENOUS | Status: AC | PRN
  Administered 2024-04-01: 0.5 mg via INTRAVENOUS
  Filled 2024-04-01: qty 1

## 2024-04-01 MED ORDER — SODIUM CHLORIDE 0.9 % IV SOLN
400.0000 mg/m2 | Freq: Once | INTRAVENOUS | Status: AC
  Administered 2024-04-01: 744 mg via INTRAVENOUS
  Filled 2024-04-01: qty 25

## 2024-04-01 MED ORDER — DEXAMETHASONE SODIUM PHOSPHATE 10 MG/ML IJ SOLN
5.0000 mg | Freq: Once | INTRAMUSCULAR | Status: AC
  Administered 2024-04-01: 5 mg via INTRAVENOUS
  Filled 2024-04-01: qty 1

## 2024-04-01 MED ORDER — PALONOSETRON HCL INJECTION 0.25 MG/5ML
0.2500 mg | Freq: Once | INTRAVENOUS | Status: AC
  Administered 2024-04-01: 0.25 mg via INTRAVENOUS
  Filled 2024-04-01: qty 5

## 2024-04-01 MED ORDER — SODIUM CHLORIDE 0.9 % IV SOLN
150.0000 mg/m2 | Freq: Once | INTRAVENOUS | Status: AC
  Administered 2024-04-01: 300 mg via INTRAVENOUS
  Filled 2024-04-01: qty 15

## 2024-04-01 MED ORDER — SODIUM CHLORIDE 0.9 % IV SOLN
2400.0000 mg/m2 | INTRAVENOUS | Status: DC
  Administered 2024-04-01: 4450 mg via INTRAVENOUS
  Filled 2024-04-01: qty 89

## 2024-04-01 MED ORDER — SODIUM CHLORIDE 0.9 % IV SOLN
150.0000 mg | Freq: Once | INTRAVENOUS | Status: AC
  Administered 2024-04-01: 150 mg via INTRAVENOUS
  Filled 2024-04-01: qty 150

## 2024-04-01 NOTE — Progress Notes (Signed)
 [facility] OFFICE PROGRESS NOTE   Diagnosis: Pancreas cancer  INTERVAL HISTORY:   David [name] completed another cycle of FOLFIRI on 03/18/2024.  No nausea.  He had hiccups on 2 nights following chemotherapy.  This improved with Thorazine.  He continues to have neuropathy symptoms in the fingers and toes.  This does not interfere with activity.  He relates diarrhea to metformin.  Diarrhea improved with Imodium. He underwent a restaging evaluation and follow-up with Dr. Brenita Callow on 03/27/2024.  The CTs revealed stable disease.  Dr. Brenita Callow recommends continuing FOLFIRI.  Objective:  Vital signs in last 24 hours:  Blood pressure 119/69, pulse 73, temperature 98.2 F (36.8 C), temperature source Temporal, resp. rate 18, height 5\' 8"  (1.727 m), weight 158 lb 6.4 oz (71.8 kg), SpO2 99%.    HEENT: No thrush or ulcers Resp: Lungs clear bilaterally Cardio: Regular rate and rhythm GI: No mass, nontender, no hepatosplenomegaly Vascular: Leg edema   Portacath/PICC-without erythema  Lab Results:  Lab Results  Component Value Date   WBC 4.7 04/01/2024   HGB 11.6 (L) 04/01/2024   HCT 34.5 (L) 04/01/2024   MCV 104.2 (H) 04/01/2024   PLT 148 (L) 04/01/2024   NEUTROABS 3.1 04/01/2024    CMP  Lab Results  Component Value Date   NA 136 04/01/2024   K 4.4 04/01/2024   CL 103 04/01/2024   CO2 26 04/01/2024   GLUCOSE 176 (H) 04/01/2024   BUN 21 (H) 04/01/2024   CREATININE 0.79 04/01/2024   CALCIUM 10.0 04/01/2024   PROT 7.1 04/01/2024   ALBUMIN 4.2 04/01/2024   AST 23 04/01/2024   ALT 32 04/01/2024   ALKPHOS 175 (H) 04/01/2024   BILITOT 0.7 04/01/2024   GFRNONAA >60 04/01/2024   GFRAA >60 03/26/2018    Lab Results  Component Value Date   CEA 772.45 (H) 05/02/2023   GMW102 72 (H) 03/18/2024    Medications: I have reviewed the patient's current medications.   Assessment/Plan: Pancreas cancer CT angiogram chest 04/24/2023-low-density mass in the dome of the liver  with at least 3 additional lesions in both hepatic lobes, borderline lymphadenopathy in the hepatoduodenal ligament Right upper quadrant ultrasound 04/24/2023-large circumscribed mass of the liver dome CT abdomen/pelvis 04/24/2023-multiple liver lesions poorly defined on noncontrast exam, prominent portacaval and porta hepatis nodes MRI abdomen 05/05/2023-multiple rim-hypoenhancing liver lesions consistent with hepatic metastases, suspicion for a pancreas tail mass Ultrasound-guided biopsy of dominant right liver lesion 05/10/2023-adenocarcinoma, CK7 and CDX2 positive, abundant cytoplasm and mucin with extracellular mucin, foci of lymphovascular invasion Tempus gene panel-K-ras G12V, T P53 mild tumor mutation burden 3.2, MSS PET 06/04/2023-multiple hypermetabolic liver lesions consistent with metastases, low-level FDG uptake in the soft tissue fullness at the tip of the pancreas tail, low-level FDG activity involving a small soft tissue nodule between the gastric fundus and spleen, tiny foci of accumulation at the right costovertebral junction at T10 and roof of the left acetabulum without an underlying CT lesion Elevated CEA and CA 19-9 EUS 06/07/2023 ,22x 15 mm irregular mass in the tail the pancreas, 11 mm subcarinal lymph node, FNA biopsy of the pancreas mass-suspicious for malignancy Cycle 1 FOLFIRINOX 06/19/2023 Cycle 2 FOLFIRINOX 07/03/2023 Cycle 3 FOLFIRINOX 07/17/2023 Cycle 4 FOLFIRINOX 07/31/2023 Cycle 5 FOLFIRINOX 08/14/2023, oxaliplatin and Decadron dose reduced Cycle 6 FOLFIRINOX 08/28/2023 Cycle 7 FOLFIRINOX 09/11/2023 CTs at [facility] 09/16/2023, compared to 04/24/2023-unchanged pancreas tail mass, decrease in attenuation of hepatic metastases, several lesions have decreased in size, no new lesions Cycle 8 FOLFIRINOX 09/25/2023  Cycle 9 FOLFIRINOX 10/09/2023 Cycle 10 FOLFIRINOX 10/22/2023 Cycle 11 FOLFIRINOX 11/05/2023 Cycle 12 FOLFIRINOX 11/20/2023 Cycle 13 FOLFIRINOX 12/04/2023 Cycle 14 FOLFIRINOX  12/19/2023 CTs at [facility] 12/23/2023: Stable to slight decrease in hepatic metastases, no new lesions, stable pancreas tail mass MRI liver at [facility] 12/26/2023: Multifocal nonenhancing hepatic metastases, pancreas tail mass not well-visualized Cycle 15 FOLFIRINOX 01/01/2024, oxaliplatin held secondary to neuropathy Cycle 16 FOLFIRINOX 01/15/2024, oxaliplatin held secondary to neuropathy Cycle 17 FOLFIRINOX 02/05/2024, oxaliplatin held secondary to neuropathy Cycle 18 FOLFIRINOX 02/19/2024, oxaliplatin held secondary to neuropathy Cycle 19 FOLFIRINOX 03/04/2024, oxaliplatin held secondary to neuropathy Cycle 20 FOLFIRINOX 03/18/2024, oxaliplatin held secondary to neuropathy CTs at [facility] 03/27/2024: Stable hepatic metastases, no new hepatic lesion, pancreas tail lesion not well-visualized unchanged right lower lobe groundglass opacity Cycle 21 FOLFIRINOX 04/01/2024, 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  Resolved with Reglan.  Recurrent hiccups following cycle 4 FOLFIRINOX-not relieved with Reglan          Disposition: David [name] appears stable.  I discussed the restaging findings with him.  CA 19-9 was lower on 03/18/2024.  I agree with continuing FOLFIRI.  He will complete another cycle today.  He will return for an office visit and chemotherapy in 2 weeks.  Coni Deep, MD  04/01/2024  9:35 AM

## 2024-04-01 NOTE — Progress Notes (Signed)
 Telephone call to patient requesting the patient return to infusion as I failed to have his pump checked by another infusion nurse.Patient returned and pump checked

## 2024-04-01 NOTE — Progress Notes (Signed)
 Staff message to [facility] in radiology requesting upload of [facility] CT CAP images from 12/23/23 and 03/27/24 to EPIC.

## 2024-04-01 NOTE — Progress Notes (Signed)
 Patient seen by Dr. Thornton Papas today  Vitals are within treatment parameters:Yes   Labs are within treatment parameters: Yes   Treatment plan has been signed: Yes   Per physician team, Patient is ready for treatment and there are NO modifications to the treatment plan.

## 2024-04-01 NOTE — Patient Instructions (Addendum)
 [facility] - A DEPT OF [facility]   Discharge Instructions: The chemotherapy medication bag should finish at 46 hours, 96 hours, or 7 days. For example, if your pump is scheduled for 46 hours and it was put on at 4:00 p.m., it should finish at 2:00 p.m. the day it is scheduled to come off regardless of your appointment time.     Estimated time to finish at 11:15  FRIDAY, April 04, 2024.   If the display on your pump reads "Low Volume" and it is beeping, take the batteries out of the pump and come to the cancer center for it to be taken off.   If the pump alarms go off prior to the pump reading "Low Volume" then call [phone] and someone can assist you.  If the plunger comes out and the chemotherapy medication is leaking out, please use your home chemo spill kit to clean up the spill. Do NOT use paper towels or other household products.  If you have problems or questions regarding your pump, please call either [phone] (24 hours a day) or the cancer center Monday-Friday 8:00 a.m.- 4:30 p.m. at the clinic number and we will assist you. If you are unable to get assistance, then go to the nearest Emergency Department and ask the staff to contact the IV team for assistance.   Thank you for choosing [facility] to provide your oncology and hematology care.   If you have a lab appointment with the Cancer Center, please go directly to the Cancer Center and check in at the registration area.   Wear comfortable clothing and clothing appropriate for easy access to any Portacath or PICC line.   We strive to give you quality time with your provider. You may need to reschedule your appointment if you arrive late (15 or more minutes).  Arriving late affects you and other patients whose appointments are after yours.  Also, if you miss three or more appointments without notifying the office, you may be dismissed from the clinic at the provider's  discretion.      For prescription refill requests, have your pharmacy contact our office and allow 72 hours for refills to be completed.    Today you received the following chemotherapy and/or immunotherapy agents IRINOTECAN/LEUCOVORIN/FLUOROURACIL     To help prevent nausea and vomiting after your treatment, we encourage you to take your nausea medication as directed.  BELOW ARE SYMPTOMS THAT SHOULD BE REPORTED IMMEDIATELY: *FEVER GREATER THAN 100.4 F (38 C) OR HIGHER *CHILLS OR SWEATING *NAUSEA AND VOMITING THAT IS NOT CONTROLLED WITH YOUR NAUSEA MEDICATION *UNUSUAL SHORTNESS OF BREATH *UNUSUAL BRUISING OR BLEEDING *URINARY PROBLEMS (pain or burning when urinating, or frequent urination) *BOWEL PROBLEMS (unusual diarrhea, constipation, pain near the anus) TENDERNESS IN MOUTH AND THROAT WITH OR WITHOUT PRESENCE OF ULCERS (sore throat, sores in mouth, or a toothache) UNUSUAL RASH, SWELLING OR PAIN  UNUSUAL VAGINAL DISCHARGE OR ITCHING   Items with * indicate a potential emergency and should be followed up as soon as possible or go to the Emergency Department if any problems should occur.  Please show the CHEMOTHERAPY ALERT CARD or IMMUNOTHERAPY ALERT CARD at check-in to the Emergency Department and triage nurse.  Should you have questions after your visit or need to cancel or reschedule your appointment, please contact [facility] - A DEPT OF [facility]  Dept: [phone]  and follow the prompts.  Office hours  are 8:00 a.m. to 4:30 p.m. Monday - Friday. Please note that voicemails left after 4:00 p.m. may not be returned until the following business day.  We are closed weekends and major holidays. You have access to a nurse at all times for urgent questions. Please call the main number to the clinic Dept: [phone] and follow the prompts.   For any non-urgent questions, you may also contact your provider using MyChart. We now offer e-Visits for anyone  64 and older to request care online for non-urgent symptoms. For details visit [email or web address].   Also download the MyChart app! Go to the app store, search "MyChart", open the app, select [facility], and log in with your MyChart username and password.  Irinotecan Injection What is this medication? IRINOTECAN (ir in oh TEE kan) treats some types of cancer. It works by slowing down the growth of cancer cells. This medicine may be used for other purposes; ask your health care provider or pharmacist if you have questions. COMMON BRAND NAME(S): Camptosar What should I tell my care team before I take this medication? They need to know if you have any of these conditions: Dehydration Diarrhea Infection, especially a viral infection, such as chickenpox, cold sores, herpes Liver disease Low blood cell levels (white cells, red cells, and platelets) Low levels of electrolytes, such as calcium, magnesium, or potassium in your blood Recent or ongoing radiation An unusual or allergic reaction to irinotecan, other medications, foods, dyes, or preservatives If you or your partner are pregnant or trying to get pregnant Breast-feeding How should I use this medication? This medication is injected into a vein. It is given by your care team in a hospital or clinic setting. Talk to your care team about the use of this medication in children. Special care may be needed. Overdosage: If you think you have taken too much of this medicine contact a poison control center or emergency room at once. NOTE: This medicine is only for you. Do not share this medicine with others. What if I miss a dose? Keep appointments for follow-up doses. It is important not to miss your dose. Call your care team if you are unable to keep an appointment. What may interact with this medication? Do not take this medication with any of the following: Cobicistat Itraconazole This medication may also interact with the  following: Certain antibiotics, such as clarithromycin, rifampin, rifabutin Certain antivirals for HIV or AIDS Certain medications for fungal infections, such as ketoconazole, posaconazole, voriconazole Certain medications for seizures, such as carbamazepine, phenobarbital, phenytoin Gemfibrozil Nefazodone St. John's wort This list may not describe all possible interactions. Give your health care provider a list of all the medicines, herbs, non-prescription drugs, or dietary supplements you use. Also tell them if you smoke, drink alcohol, or use illegal drugs. Some items may interact with your medicine. What should I watch for while using this medication? Your condition will be monitored carefully while you are receiving this medication. You may need blood work while taking this medication. This medication may make you feel generally unwell. This is not uncommon as chemotherapy can affect healthy cells as well as cancer cells. Report any side effects. Continue your course of treatment even though you feel ill unless your care team tells you to stop. This medication can cause serious side effects. To reduce the risk, your care team may give you other medications to take before receiving this one. Be sure to follow the directions from your care team. This medication may  affect your coordination, reaction time, or judgement. Do not drive or operate machinery until you know how this medication affects you. Sit up or stand slowly to reduce the risk of dizzy or fainting spells. Drinking alcohol with this medication can increase the risk of these side effects. This medication may increase your risk of getting an infection. Call your care team for advice if you get a fever, chills, sore throat, or other symptoms of a cold or flu. Do not treat yourself. Try to avoid being around people who are sick. Avoid taking medications that contain aspirin, acetaminophen, ibuprofen, naproxen, or ketoprofen unless  instructed by your care team. These medications may hide a fever. This medication may increase your risk to bruise or bleed. Call your care team if you notice any unusual bleeding. Be careful brushing or flossing your teeth or using a toothpick because you may get an infection or bleed more easily. If you have any dental work done, tell your dentist you are receiving this medication. Talk to your care team if you or your partner are pregnant or think either of you might be pregnant. This medication can cause serious birth defects if taken during pregnancy and for 6 months after the last dose. You will need a negative pregnancy test before starting this medication. Contraception is recommended while taking this medication and for 6 months after the last dose. Your care team can help you find the option that works for you. Do not father a child while taking this medication and for 3 months after the last dose. Use a condom for contraception during this time period. Do not breastfeed while taking this medication and for 7 days after the last dose. This medication may cause infertility. Talk to your care team if you are concerned about your fertility. What side effects may I notice from receiving this medication? Side effects that you should report to your care team as soon as possible: Allergic reactions--skin rash, itching, hives, swelling of the face, lips, tongue, or throat Dry cough, shortness of breath or trouble breathing Increased saliva or tears, increased sweating, stomach cramping, diarrhea, small pupils, unusual weakness or fatigue, slow heartbeat Infection--fever, chills, cough, sore throat, wounds that don't heal, pain or trouble when passing urine, general feeling of discomfort or being unwell Kidney injury--decrease in the amount of urine, swelling of the ankles, hands, or feet Low red blood cell level--unusual weakness or fatigue, dizziness, headache, trouble breathing Severe or prolonged  diarrhea Unusual bruising or bleeding Side effects that usually do not require medical attention (report to your care team if they continue or are bothersome): Constipation Diarrhea Hair loss Loss of appetite Nausea Stomach pain This list may not describe all possible side effects. Call your doctor for medical advice about side effects. You may report side effects to FDA at 1-800-FDA-1088. Where should I keep my medication? This medication is given in a hospital or clinic. It will not be stored at home. NOTE: This sheet is a summary. It may not cover all possible information. If you have questions about this medicine, talk to your doctor, pharmacist, or health care provider.  2024 Elsevier/Gold Standard (2022-04-16 00:00:00)  Leucovorin Injection What is this medication? LEUCOVORIN (loo koe VOR in) prevents side effects from certain medications, such as methotrexate. It works by increasing folate levels. This helps protect healthy cells in your body. It may also be used to treat anemia caused by low levels of folate. It can also be used with fluorouracil, a type  of chemotherapy, to treat colorectal cancer. It works by increasing the effects of fluorouracil in the body. This medicine may be used for other purposes; ask your health care provider or pharmacist if you have questions. What should I tell my care team before I take this medication? They need to know if you have any of these conditions: Anemia from low levels of vitamin B12 in the blood An unusual or allergic reaction to leucovorin, folic acid, other medications, foods, dyes, or preservatives Pregnant or trying to get pregnant Breastfeeding How should I use this medication? This medication is injected into a vein or a muscle. It is given by your care team in a hospital or clinic setting. Talk to your care team about the use of this medication in children. Special care may be needed. Overdosage: If you think you have taken too  much of this medicine contact a poison control center or emergency room at once. NOTE: This medicine is only for you. Do not share this medicine with others. What if I miss a dose? Keep appointments for follow-up doses. It is important not to miss your dose. Call your care team if you are unable to keep an appointment. What may interact with this medication? Capecitabine Fluorouracil Phenobarbital Phenytoin Primidone Trimethoprim;sulfamethoxazole This list may not describe all possible interactions. Give your health care provider a list of all the medicines, herbs, non-prescription drugs, or dietary supplements you use. Also tell them if you smoke, drink alcohol, or use illegal drugs. Some items may interact with your medicine. What should I watch for while using this medication? Your condition will be monitored carefully while you are receiving this medication. This medication may increase the side effects of 5-fluorouracil. Tell your care team if you have diarrhea or mouth sores that do not get better or that get worse. What side effects may I notice from receiving this medication? Side effects that you should report to your care team as soon as possible: Allergic reactions--skin rash, itching, hives, swelling of the face, lips, tongue, or throat This list may not describe all possible side effects. Call your doctor for medical advice about side effects. You may report side effects to FDA at 1-800-FDA-1088. Where should I keep my medication? This medication is given in a hospital or clinic. It will not be stored at home. NOTE: This sheet is a summary. It may not cover all possible information. If you have questions about this medicine, talk to your doctor, pharmacist, or health care provider.  2024 Elsevier/Gold Standard (2022-05-08 00:00:00)  Fluorouracil Injection What is this medication? FLUOROURACIL (flure oh YOOR a sil) treats some types of cancer. It works by slowing down the  growth of cancer cells. This medicine may be used for other purposes; ask your health care provider or pharmacist if you have questions. COMMON BRAND NAME(S): Adrucil What should I tell my care team before I take this medication? They need to know if you have any of these conditions: Blood disorders Dihydropyrimidine dehydrogenase (DPD) deficiency Infection, such as chickenpox, cold sores, herpes Kidney disease Liver disease Poor nutrition Recent or ongoing radiation therapy An unusual or allergic reaction to fluorouracil, other medications, foods, dyes, or preservatives If you or your partner are pregnant or trying to get pregnant Breast-feeding How should I use this medication? This medication is injected into a vein. It is administered by your care team in a hospital or clinic setting. Talk to your care team about the use of this medication in children. Special  care may be needed. Overdosage: If you think you have taken too much of this medicine contact a poison control center or emergency room at once. NOTE: This medicine is only for you. Do not share this medicine with others. What if I miss a dose? Keep appointments for follow-up doses. It is important not to miss your dose. Call your care team if you are unable to keep an appointment. What may interact with this medication? Do not take this medication with any of the following: Live virus vaccines This medication may also interact with the following: Medications that treat or prevent blood clots, such as warfarin, enoxaparin, dalteparin This list may not describe all possible interactions. Give your health care provider a list of all the medicines, herbs, non-prescription drugs, or dietary supplements you use. Also tell them if you smoke, drink alcohol, or use illegal drugs. Some items may interact with your medicine. What should I watch for while using this medication? Your condition will be monitored carefully while you are  receiving this medication. This medication may make you feel generally unwell. This is not uncommon as chemotherapy can affect healthy cells as well as cancer cells. Report any side effects. Continue your course of treatment even though you feel ill unless your care team tells you to stop. In some cases, you may be given additional medications to help with side effects. Follow all directions for their use. This medication may increase your risk of getting an infection. Call your care team for advice if you get a fever, chills, sore throat, or other symptoms of a cold or flu. Do not treat yourself. Try to avoid being around people who are sick. This medication may increase your risk to bruise or bleed. Call your care team if you notice any unusual bleeding. Be careful brushing or flossing your teeth or using a toothpick because you may get an infection or bleed more easily. If you have any dental work done, tell your dentist you are receiving this medication. Avoid taking medications that contain aspirin, acetaminophen, ibuprofen, naproxen, or ketoprofen unless instructed by your care team. These medications may hide a fever. Do not treat diarrhea with over the counter products. Contact your care team if you have diarrhea that lasts more than 2 days or if it is severe and watery. This medication can make you more sensitive to the sun. Keep out of the sun. If you cannot avoid being in the sun, wear protective clothing and sunscreen. Do not use sun lamps, tanning beds, or tanning booths. Talk to your care team if you or your partner wish to become pregnant or think you might be pregnant. This medication can cause serious birth defects if taken during pregnancy and for 3 months after the last dose. A reliable form of contraception is recommended while taking this medication and for 3 months after the last dose. Talk to your care team about effective forms of contraception. Do not father a child while taking  this medication and for 3 months after the last dose. Use a condom while having sex during this time period. Do not breastfeed while taking this medication. This medication may cause infertility. Talk to your care team if you are concerned about your fertility. What side effects may I notice from receiving this medication? Side effects that you should report to your care team as soon as possible: Allergic reactions--skin rash, itching, hives, swelling of the face, lips, tongue, or throat Heart attack--pain or tightness in the chest, shoulders,  arms, or jaw, nausea, shortness of breath, cold or clammy skin, feeling faint or lightheaded Heart failure--shortness of breath, swelling of the ankles, feet, or hands, sudden weight gain, unusual weakness or fatigue Heart rhythm changes--fast or irregular heartbeat, dizziness, feeling faint or lightheaded, chest pain, trouble breathing High ammonia level--unusual weakness or fatigue, confusion, loss of appetite, nausea, vomiting, seizures Infection--fever, chills, cough, sore throat, wounds that don't heal, pain or trouble when passing urine, general feeling of discomfort or being unwell Low red blood cell level--unusual weakness or fatigue, dizziness, headache, trouble breathing Pain, tingling, or numbness in the hands or feet, muscle weakness, change in vision, confusion or trouble speaking, loss of balance or coordination, trouble walking, seizures Redness, swelling, and blistering of the skin over hands and feet Severe or prolonged diarrhea Unusual bruising or bleeding Side effects that usually do not require medical attention (report to your care team if they continue or are bothersome): Dry skin Headache Increased tears Nausea Pain, redness, or swelling with sores inside the mouth or throat Sensitivity to light Vomiting This list may not describe all possible side effects. Call your doctor for medical advice about side effects. You may report  side effects to FDA at 1-800-FDA-1088. Where should I keep my medication? This medication is given in a hospital or clinic. It will not be stored at home. NOTE: This sheet is a summary. It may not cover all possible information. If you have questions about this medicine, talk to your doctor, pharmacist, or health care provider.  2024 Elsevier/Gold Standard (2022-04-10 00:00:00)

## 2024-04-02 ENCOUNTER — Other Ambulatory Visit: Payer: Self-pay | Admitting: Oncology

## 2024-04-02 ENCOUNTER — Other Ambulatory Visit: Payer: Self-pay

## 2024-04-02 LAB — CANCER ANTIGEN 19-9: CA 19-9: 59 U/mL — ABNORMAL HIGH (ref 0–35)

## 2024-04-03 ENCOUNTER — Encounter: Payer: Self-pay | Admitting: Oncology

## 2024-04-03 ENCOUNTER — Inpatient Hospital Stay

## 2024-04-03 VITALS — BP 136/73 | HR 83 | Temp 98.2°F | Resp 18

## 2024-04-03 DIAGNOSIS — Z5111 Encounter for antineoplastic chemotherapy: Secondary | ICD-10-CM | POA: Diagnosis not present

## 2024-04-03 DIAGNOSIS — C259 Malignant neoplasm of pancreas, unspecified: Secondary | ICD-10-CM

## 2024-04-03 MED ORDER — SODIUM CHLORIDE 0.9% FLUSH
10.0000 mL | INTRAVENOUS | Status: DC | PRN
  Administered 2024-04-03: 10 mL

## 2024-04-03 MED ORDER — HEPARIN SOD (PORK) LOCK FLUSH 100 UNIT/ML IV SOLN
500.0000 [IU] | Freq: Once | INTRAVENOUS | Status: AC | PRN
  Administered 2024-04-03: 500 [IU]

## 2024-04-03 MED ORDER — PEGFILGRASTIM-JMDB 6 MG/0.6ML ~~LOC~~ SOSY
6.0000 mg | PREFILLED_SYRINGE | Freq: Once | SUBCUTANEOUS | Status: AC
Start: 2024-04-03 — End: 2024-04-03
  Administered 2024-04-03: 6 mg via SUBCUTANEOUS
  Filled 2024-04-03: qty 0.6

## 2024-04-08 ENCOUNTER — Encounter: Payer: Self-pay | Admitting: Oncology

## 2024-04-12 ENCOUNTER — Other Ambulatory Visit: Payer: Self-pay | Admitting: Oncology

## 2024-04-13 ENCOUNTER — Encounter: Payer: Self-pay | Admitting: Oncology

## 2024-04-15 ENCOUNTER — Inpatient Hospital Stay

## 2024-04-15 ENCOUNTER — Inpatient Hospital Stay (HOSPITAL_BASED_OUTPATIENT_CLINIC_OR_DEPARTMENT_OTHER): Admitting: Nurse Practitioner

## 2024-04-15 ENCOUNTER — Encounter: Payer: Self-pay | Admitting: Nurse Practitioner

## 2024-04-15 VITALS — BP 135/74 | HR 80 | Temp 97.9°F | Resp 18 | Ht 68.0 in | Wt 160.0 lb

## 2024-04-15 VITALS — BP 119/73 | HR 68

## 2024-04-15 DIAGNOSIS — C259 Malignant neoplasm of pancreas, unspecified: Secondary | ICD-10-CM

## 2024-04-15 DIAGNOSIS — C787 Secondary malignant neoplasm of liver and intrahepatic bile duct: Secondary | ICD-10-CM | POA: Diagnosis not present

## 2024-04-15 DIAGNOSIS — Z5111 Encounter for antineoplastic chemotherapy: Secondary | ICD-10-CM | POA: Diagnosis not present

## 2024-04-15 LAB — CBC WITH DIFFERENTIAL (CANCER CENTER ONLY)
Abs Immature Granulocytes: 0.16 10*3/uL — ABNORMAL HIGH (ref 0.00–0.07)
Basophils Absolute: 0 10*3/uL (ref 0.0–0.1)
Basophils Relative: 0 %
Eosinophils Absolute: 0.1 10*3/uL (ref 0.0–0.5)
Eosinophils Relative: 1 %
HCT: 33.8 % — ABNORMAL LOW (ref 39.0–52.0)
Hemoglobin: 11.2 g/dL — ABNORMAL LOW (ref 13.0–17.0)
Immature Granulocytes: 3 %
Lymphocytes Relative: 16 %
Lymphs Abs: 0.9 10*3/uL (ref 0.7–4.0)
MCH: 34.7 pg — ABNORMAL HIGH (ref 26.0–34.0)
MCHC: 33.1 g/dL (ref 30.0–36.0)
MCV: 104.6 fL — ABNORMAL HIGH (ref 80.0–100.0)
Monocytes Absolute: 0.5 10*3/uL (ref 0.1–1.0)
Monocytes Relative: 9 %
Neutro Abs: 3.7 10*3/uL (ref 1.7–7.7)
Neutrophils Relative %: 71 %
Platelet Count: 136 10*3/uL — ABNORMAL LOW (ref 150–400)
RBC: 3.23 MIL/uL — ABNORMAL LOW (ref 4.22–5.81)
RDW: 13 % (ref 11.5–15.5)
WBC Count: 5.3 10*3/uL (ref 4.0–10.5)
nRBC: 0 % (ref 0.0–0.2)

## 2024-04-15 LAB — CMP (CANCER CENTER ONLY)
ALT: 35 U/L (ref 0–44)
AST: 28 U/L (ref 15–41)
Albumin: 4.3 g/dL (ref 3.5–5.0)
Alkaline Phosphatase: 225 U/L — ABNORMAL HIGH (ref 38–126)
Anion gap: 13 (ref 5–15)
BUN: 17 mg/dL (ref 6–20)
CO2: 23 mmol/L (ref 22–32)
Calcium: 10 mg/dL (ref 8.9–10.3)
Chloride: 101 mmol/L (ref 98–111)
Creatinine: 1.03 mg/dL (ref 0.61–1.24)
GFR, Estimated: >60 mL/min (ref >60)
Glucose, Bld: 199 mg/dL — ABNORMAL HIGH (ref 70–99)
Potassium: 4.3 mmol/L (ref 3.5–5.1)
Sodium: 136 mmol/L (ref 135–145)
Total Bilirubin: 0.4 mg/dL (ref 0.0–1.2)
Total Protein: 6.8 g/dL (ref 6.5–8.1)

## 2024-04-15 MED ORDER — PALONOSETRON HCL INJECTION 0.25 MG/5ML
0.2500 mg | Freq: Once | INTRAVENOUS | Status: AC
  Administered 2024-04-15: 0.25 mg via INTRAVENOUS
  Filled 2024-04-15: qty 5

## 2024-04-15 MED ORDER — DEXAMETHASONE SODIUM PHOSPHATE 10 MG/ML IJ SOLN
5.0000 mg | Freq: Once | INTRAMUSCULAR | Status: AC
  Administered 2024-04-15: 5 mg via INTRAVENOUS
  Filled 2024-04-15: qty 1

## 2024-04-15 MED ORDER — SODIUM CHLORIDE 0.9 % IV SOLN
150.0000 mg | Freq: Once | INTRAVENOUS | Status: AC
  Administered 2024-04-15: 150 mg via INTRAVENOUS
  Filled 2024-04-15: qty 150

## 2024-04-15 MED ORDER — ATROPINE SULFATE 1 MG/ML IV SOLN
0.5000 mg | Freq: Once | INTRAVENOUS | Status: AC | PRN
  Administered 2024-04-15: 0.5 mg via INTRAVENOUS
  Filled 2024-04-15: qty 1

## 2024-04-15 MED ORDER — SODIUM CHLORIDE 0.9 % IV SOLN
Freq: Once | INTRAVENOUS | Status: AC
Start: 2024-04-15 — End: 2024-04-15

## 2024-04-15 MED ORDER — SODIUM CHLORIDE 0.9 % IV SOLN
150.0000 mg/m2 | Freq: Once | INTRAVENOUS | Status: AC
  Administered 2024-04-15: 300 mg via INTRAVENOUS
  Filled 2024-04-15: qty 15

## 2024-04-15 MED ORDER — SODIUM CHLORIDE 0.9 % IV SOLN
400.0000 mg/m2 | Freq: Once | INTRAVENOUS | Status: AC
  Administered 2024-04-15: 744 mg via INTRAVENOUS
  Filled 2024-04-15: qty 37.2

## 2024-04-15 MED ORDER — SODIUM CHLORIDE 0.9 % IV SOLN
2400.0000 mg/m2 | INTRAVENOUS | Status: DC
  Administered 2024-04-15: 4450 mg via INTRAVENOUS
  Filled 2024-04-15: qty 39

## 2024-04-15 NOTE — Patient Instructions (Signed)
 [facility] - A DEPT OF [place]   Discharge Instructions: The chemotherapy medication bag should finish at 46 hours, 96 hours, or 7 days. For example, if your pump is scheduled for 46 hours and it was put on at 4:00 p.m., it should finish at 2:00 p.m. the day it is scheduled to come off regardless of your appointment time.     Estimated time to finish at 11:15 FRIDAY, Apr 17, 2024.   If the display on your pump reads "Low Volume" and it is beeping, take the batteries out of the pump and come to the cancer center for it to be taken off.   If the pump alarms go off prior to the pump reading "Low Volume" then call [phone] and someone can assist you.  If the plunger comes out and the chemotherapy medication is leaking out, please use your home chemo spill kit to clean up the spill. Do NOT use paper towels or other household products.  If you have problems or questions regarding your pump, please call either [phone] (24 hours a day) or the cancer center Monday-Friday 8:00 a.m.- 4:30 p.m. at the clinic number and we will assist you. If you are unable to get assistance, then go to the nearest Emergency Department and ask the staff to contact the IV team for assistance.   Thank you for choosing  Cancer Center to provide your oncology and hematology care.   If you have a lab appointment with the Cancer Center, please go directly to the Cancer Center and check in at the registration area.   Wear comfortable clothing and clothing appropriate for easy access to any Portacath or PICC line.   We strive to give you quality time with your provider. You may need to reschedule your appointment if you arrive late (15 or more minutes).  Arriving late affects you and other patients whose appointments are after yours.  Also, if you miss three or more appointments without notifying the office, you may be dismissed from the clinic at the provider's discretion.       For prescription refill requests, have your pharmacy contact our office and allow 72 hours for refills to be completed.    Today you received the following chemotherapy and/or immunotherapy agents IRINOTECAN /LEUCOVORIN /FLUOROURACIL .      To help prevent nausea and vomiting after your treatment, we encourage you to take your nausea medication as directed.  BELOW ARE SYMPTOMS THAT SHOULD BE REPORTED IMMEDIATELY: *FEVER GREATER THAN 100.4 F (38 C) OR HIGHER *CHILLS OR SWEATING *NAUSEA AND VOMITING THAT IS NOT CONTROLLED WITH YOUR NAUSEA MEDICATION *UNUSUAL SHORTNESS OF BREATH *UNUSUAL BRUISING OR BLEEDING *URINARY PROBLEMS (pain or burning when urinating, or frequent urination) *BOWEL PROBLEMS (unusual diarrhea, constipation, pain near the anus) TENDERNESS IN MOUTH AND THROAT WITH OR WITHOUT PRESENCE OF ULCERS (sore throat, sores in mouth, or a toothache) UNUSUAL RASH, SWELLING OR PAIN  UNUSUAL VAGINAL DISCHARGE OR ITCHING   Items with * indicate a potential emergency and should be followed up as soon as possible or go to the Emergency Department if any problems should occur.  Please show the CHEMOTHERAPY ALERT CARD or IMMUNOTHERAPY ALERT CARD at check-in to the Emergency Department and triage nurse.  Should you have questions after your visit or need to cancel or reschedule your appointment, please contact [facility] - A DEPT OF [facility]  Dept: [phone]  and follow the prompts.  Office hours  are 8:00 a.m. to 4:30 p.m. Monday - Friday. Please note that voicemails left after 4:00 p.m. may not be returned until the following business day.  We are closed weekends and major holidays. You have access to a nurse at all times for urgent questions. Please call the main number to the clinic Dept: [phone] and follow the prompts.   For any non-urgent questions, you may also contact your provider using MyChart. We now offer e-Visits for anyone 46 and  older to request care online for non-urgent symptoms. For details visit [email or web address].   Also download the MyChart app! Go to the app store, search "MyChart", open the app, select [facility], and log in with your MyChart username and password.  Irinotecan  Injection What is this medication? IRINOTECAN  (ir in oh TEE kan) treats some types of cancer. It works by slowing down the growth of cancer cells. This medicine may be used for other purposes; ask your health care provider or pharmacist if you have questions. COMMON BRAND NAME(S): Camptosar  What should I tell my care team before I take this medication? They need to know if you have any of these conditions: Dehydration Diarrhea Infection, especially a viral infection, such as chickenpox, cold sores, herpes Liver disease Low blood cell levels (white cells, red cells, and platelets) Low levels of electrolytes, such as calcium , magnesium, or potassium in your blood Recent or ongoing radiation An unusual or allergic reaction to irinotecan , other medications, foods, dyes, or preservatives If you or your partner are pregnant or trying to get pregnant Breast-feeding How should I use this medication? This medication is injected into a vein. It is given by your care team in a hospital or clinic setting. Talk to your care team about the use of this medication in children. Special care may be needed. Overdosage: If you think you have taken too much of this medicine contact a poison control center or emergency room at once. NOTE: This medicine is only for you. Do not share this medicine with others. What if I miss a dose? Keep appointments for follow-up doses. It is important not to miss your dose. Call your care team if you are unable to keep an appointment. What may interact with this medication? Do not take this medication with any of the following: Cobicistat Itraconazole This medication may also interact with the  following: Certain antibiotics, such as clarithromycin, rifampin, rifabutin Certain antivirals for HIV or AIDS Certain medications for fungal infections, such as ketoconazole, posaconazole, voriconazole Certain medications for seizures, such as carbamazepine, phenobarbital, phenytoin Gemfibrozil Nefazodone St. John's wort This list may not describe all possible interactions. Give your health care provider a list of all the medicines, herbs, non-prescription drugs, or dietary supplements you use. Also tell them if you smoke, drink alcohol, or use illegal drugs. Some items may interact with your medicine. What should I watch for while using this medication? Your condition will be monitored carefully while you are receiving this medication. You may need blood work while taking this medication. This medication may make you feel generally unwell. This is not uncommon as chemotherapy can affect healthy cells as well as cancer cells. Report any side effects. Continue your course of treatment even though you feel ill unless your care team tells you to stop. This medication can cause serious side effects. To reduce the risk, your care team may give you other medications to take before receiving this one. Be sure to follow the directions from your care team. This medication may  affect your coordination, reaction time, or judgement. Do not drive or operate machinery until you know how this medication affects you. Sit up or stand slowly to reduce the risk of dizzy or fainting spells. Drinking alcohol with this medication can increase the risk of these side effects. This medication may increase your risk of getting an infection. Call your care team for advice if you get a fever, chills, sore throat, or other symptoms of a cold or flu. Do not treat yourself. Try to avoid being around people who are sick. Avoid taking medications that contain aspirin, acetaminophen , ibuprofen, naproxen, or ketoprofen unless  instructed by your care team. These medications may hide a fever. This medication may increase your risk to bruise or bleed. Call your care team if you notice any unusual bleeding. Be careful brushing or flossing your teeth or using a toothpick because you may get an infection or bleed more easily. If you have any dental work done, tell your dentist you are receiving this medication. Talk to your care team if you or your partner are pregnant or think either of you might be pregnant. This medication can cause serious birth defects if taken during pregnancy and for 6 months after the last dose. You will need a negative pregnancy test before starting this medication. Contraception is recommended while taking this medication and for 6 months after the last dose. Your care team can help you find the option that works for you. Do not father a child while taking this medication and for 3 months after the last dose. Use a condom for contraception during this time period. Do not breastfeed while taking this medication and for 7 days after the last dose. This medication may cause infertility. Talk to your care team if you are concerned about your fertility. What side effects may I notice from receiving this medication? Side effects that you should report to your care team as soon as possible: Allergic reactions--skin rash, itching, hives, swelling of the face, lips, tongue, or throat Dry cough, shortness of breath or trouble breathing Increased saliva or tears, increased sweating, stomach cramping, diarrhea, small pupils, unusual weakness or fatigue, slow heartbeat Infection--fever, chills, cough, sore throat, wounds that don't heal, pain or trouble when passing urine, general feeling of discomfort or being unwell Kidney injury--decrease in the amount of urine, swelling of the ankles, hands, or feet Low red blood cell level--unusual weakness or fatigue, dizziness, headache, trouble breathing Severe or prolonged  diarrhea Unusual bruising or bleeding Side effects that usually do not require medical attention (report to your care team if they continue or are bothersome): Constipation Diarrhea Hair loss Loss of appetite Nausea Stomach pain This list may not describe all possible side effects. Call your doctor for medical advice about side effects. You may report side effects to FDA at 1-800-FDA-1088. Where should I keep my medication? This medication is given in a hospital or clinic. It will not be stored at home. NOTE: This sheet is a summary. It may not cover all possible information. If you have questions about this medicine, talk to your doctor, pharmacist, or health care provider.  2024 Elsevier/Gold Standard (2022-04-16 00:00:00)  Leucovorin  Injection What is this medication? LEUCOVORIN  (loo koe VOR in) prevents side effects from certain medications, such as methotrexate. It works by increasing folate levels. This helps protect healthy cells in your body. It may also be used to treat anemia caused by low levels of folate. It can also be used with fluorouracil , a type  of chemotherapy, to treat colorectal cancer. It works by increasing the effects of fluorouracil  in the body. This medicine may be used for other purposes; ask your health care provider or pharmacist if you have questions. What should I tell my care team before I take this medication? They need to know if you have any of these conditions: Anemia from low levels of vitamin B12 in the blood An unusual or allergic reaction to leucovorin , folic acid, other medications, foods, dyes, or preservatives Pregnant or trying to get pregnant Breastfeeding How should I use this medication? This medication is injected into a vein or a muscle. It is given by your care team in a hospital or clinic setting. Talk to your care team about the use of this medication in children. Special care may be needed. Overdosage: If you think you have taken too  much of this medicine contact a poison control center or emergency room at once. NOTE: This medicine is only for you. Do not share this medicine with others. What if I miss a dose? Keep appointments for follow-up doses. It is important not to miss your dose. Call your care team if you are unable to keep an appointment. What may interact with this medication? Capecitabine Fluorouracil  Phenobarbital Phenytoin Primidone Trimethoprim;sulfamethoxazole This list may not describe all possible interactions. Give your health care provider a list of all the medicines, herbs, non-prescription drugs, or dietary supplements you use. Also tell them if you smoke, drink alcohol, or use illegal drugs. Some items may interact with your medicine. What should I watch for while using this medication? Your condition will be monitored carefully while you are receiving this medication. This medication may increase the side effects of 5-fluorouracil . Tell your care team if you have diarrhea or mouth sores that do not get better or that get worse. What side effects may I notice from receiving this medication? Side effects that you should report to your care team as soon as possible: Allergic reactions--skin rash, itching, hives, swelling of the face, lips, tongue, or throat This list may not describe all possible side effects. Call your doctor for medical advice about side effects. You may report side effects to FDA at 1-800-FDA-1088. Where should I keep my medication? This medication is given in a hospital or clinic. It will not be stored at home. NOTE: This sheet is a summary. It may not cover all possible information. If you have questions about this medicine, talk to your doctor, pharmacist, or health care provider.  2024 Elsevier/Gold Standard (2022-05-08 00:00:00)  Fluorouracil  Injection What is this medication? FLUOROURACIL  (flure oh YOOR a sil) treats some types of cancer. It works by slowing down the  growth of cancer cells. This medicine may be used for other purposes; ask your health care provider or pharmacist if you have questions. COMMON BRAND NAME(S): Adrucil  What should I tell my care team before I take this medication? They need to know if you have any of these conditions: Blood disorders Dihydropyrimidine dehydrogenase (DPD) deficiency Infection, such as chickenpox, cold sores, herpes Kidney disease Liver disease Poor nutrition Recent or ongoing radiation therapy An unusual or allergic reaction to fluorouracil , other medications, foods, dyes, or preservatives If you or your partner are pregnant or trying to get pregnant Breast-feeding How should I use this medication? This medication is injected into a vein. It is administered by your care team in a hospital or clinic setting. Talk to your care team about the use of this medication in children. Special  care may be needed. Overdosage: If you think you have taken too much of this medicine contact a poison control center or emergency room at once. NOTE: This medicine is only for you. Do not share this medicine with others. What if I miss a dose? Keep appointments for follow-up doses. It is important not to miss your dose. Call your care team if you are unable to keep an appointment. What may interact with this medication? Do not take this medication with any of the following: Live virus vaccines This medication may also interact with the following: Medications that treat or prevent blood clots, such as warfarin, enoxaparin, dalteparin This list may not describe all possible interactions. Give your health care provider a list of all the medicines, herbs, non-prescription drugs, or dietary supplements you use. Also tell them if you smoke, drink alcohol, or use illegal drugs. Some items may interact with your medicine. What should I watch for while using this medication? Your condition will be monitored carefully while you are  receiving this medication. This medication may make you feel generally unwell. This is not uncommon as chemotherapy can affect healthy cells as well as cancer cells. Report any side effects. Continue your course of treatment even though you feel ill unless your care team tells you to stop. In some cases, you may be given additional medications to help with side effects. Follow all directions for their use. This medication may increase your risk of getting an infection. Call your care team for advice if you get a fever, chills, sore throat, or other symptoms of a cold or flu. Do not treat yourself. Try to avoid being around people who are sick. This medication may increase your risk to bruise or bleed. Call your care team if you notice any unusual bleeding. Be careful brushing or flossing your teeth or using a toothpick because you may get an infection or bleed more easily. If you have any dental work done, tell your dentist you are receiving this medication. Avoid taking medications that contain aspirin, acetaminophen , ibuprofen, naproxen, or ketoprofen unless instructed by your care team. These medications may hide a fever. Do not treat diarrhea with over the counter products. Contact your care team if you have diarrhea that lasts more than 2 days or if it is severe and watery. This medication can make you more sensitive to the sun. Keep out of the sun. If you cannot avoid being in the sun, wear protective clothing and sunscreen. Do not use sun lamps, tanning beds, or tanning booths. Talk to your care team if you or your partner wish to become pregnant or think you might be pregnant. This medication can cause serious birth defects if taken during pregnancy and for 3 months after the last dose. A reliable form of contraception is recommended while taking this medication and for 3 months after the last dose. Talk to your care team about effective forms of contraception. Do not father a child while taking  this medication and for 3 months after the last dose. Use a condom while having sex during this time period. Do not breastfeed while taking this medication. This medication may cause infertility. Talk to your care team if you are concerned about your fertility. What side effects may I notice from receiving this medication? Side effects that you should report to your care team as soon as possible: Allergic reactions--skin rash, itching, hives, swelling of the face, lips, tongue, or throat Heart attack--pain or tightness in the chest, shoulders,  arms, or jaw, nausea, shortness of breath, cold or clammy skin, feeling faint or lightheaded Heart failure--shortness of breath, swelling of the ankles, feet, or hands, sudden weight gain, unusual weakness or fatigue Heart rhythm changes--fast or irregular heartbeat, dizziness, feeling faint or lightheaded, chest pain, trouble breathing High ammonia level--unusual weakness or fatigue, confusion, loss of appetite, nausea, vomiting, seizures Infection--fever, chills, cough, sore throat, wounds that don't heal, pain or trouble when passing urine, general feeling of discomfort or being unwell Low red blood cell level--unusual weakness or fatigue, dizziness, headache, trouble breathing Pain, tingling, or numbness in the hands or feet, muscle weakness, change in vision, confusion or trouble speaking, loss of balance or coordination, trouble walking, seizures Redness, swelling, and blistering of the skin over hands and feet Severe or prolonged diarrhea Unusual bruising or bleeding Side effects that usually do not require medical attention (report to your care team if they continue or are bothersome): Dry skin Headache Increased tears Nausea Pain, redness, or swelling with sores inside the mouth or throat Sensitivity to light Vomiting This list may not describe all possible side effects. Call your doctor for medical advice about side effects. You may report  side effects to FDA at 1-800-FDA-1088. Where should I keep my medication? This medication is given in a hospital or clinic. It will not be stored at home. NOTE: This sheet is a summary. It may not cover all possible information. If you have questions about this medicine, talk to your doctor, pharmacist, or health care provider.  2024 Elsevier/Gold Standard (2022-04-10 00:00:00)

## 2024-04-15 NOTE — Progress Notes (Signed)
 Patient seen by Lonna Cobb NP today  Vitals are within treatment parameters:Yes   Labs are within treatment parameters: Yes   Treatment plan has been signed: Yes   Per physician team, Patient is ready for treatment and there are NO modifications to the treatment plan.

## 2024-04-15 NOTE — Progress Notes (Signed)
 [name] OFFICE PROGRESS NOTE   Diagnosis: Pancreas [name]  INTERVAL HISTORY:   [name] returns as scheduled.  He completed another cycle of FOLFIRI 04/01/2024.  He developed hiccups the night of treatment.  Resolved with Compazine .  No nausea or vomiting.  No mouth sores.  Loose stools which he attributes to metformin.  Stable neuropathy symptoms in the fingertips and toes.  Symptoms do not interfere with activity.  Appetite is described as "great".  He is gaining weight.  No abdominal pain.  Objective:  Vital signs in last 24 hours:  Blood pressure 135/74, pulse 80, temperature 97.9 F (36.6 C), temperature source Temporal, resp. rate 18, height 5\' 8"  (1.727 m), weight 160 lb (72.6 kg), SpO2 100%.    HEENT: No thrush or ulcers. Resp: Lungs clear bilaterally. Cardio: Regular rate and rhythm. GI: Abdomen soft and nontender.  No hepatosplenomegaly.  No mass. Vascular: No leg edema. Skin: Palms without erythema. Port-A-Cath without erythema.  Lab Results:  Lab Results  Component Value Date   WBC 5.3 04/15/2024   HGB 11.2 (L) 04/15/2024   HCT 33.8 (L) 04/15/2024   MCV 104.6 (H) 04/15/2024   PLT 136 (L) 04/15/2024   NEUTROABS 3.7 04/15/2024    Imaging:  No results found.  Medications: I have reviewed the patient's current medications.  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, CK7 and CDX2 positive, abundant cytoplasm and mucin with extracellular mucin, foci of lymphovascular invasion Tempus gene panel-K-ras G12V, T P53 mild tumor mutation burden 3.2, MSS PET 06/04/2023-multiple hypermetabolic liver lesions consistent with metastases, low-level FDG uptake in the soft tissue fullness at the tip of the pancreas tail, low-level FDG activity involving a small soft tissue nodule between the gastric fundus and spleen, tiny foci of accumulation at the right costovertebral junction at T10 and roof of the left acetabulum without an underlying CT lesion Elevated CEA and CA 19-9 EUS 06/07/2023 ,22x 15 mm irregular mass in the tail the pancreas, 11 mm subcarinal lymph node, FNA biopsy of the pancreas mass-suspicious for malignancy Cycle 1 FOLFIRINOX 06/19/2023 Cycle 2 FOLFIRINOX 07/03/2023 Cycle 3 FOLFIRINOX 07/17/2023 Cycle 4 FOLFIRINOX 07/31/2023 Cycle 5 FOLFIRINOX 08/14/2023, oxaliplatin  and Decadron  dose reduced Cycle 6 FOLFIRINOX 08/28/2023 Cycle 7 FOLFIRINOX 09/11/2023 CT[facility] at [facility] 09/16/2023, compared to 04/24/2023-unchanged pancreas tail mass, decrease in attenuation of hepatic metastases, several lesions have decreased in size, no new lesions Cycle 8 FOLFIRINOX 09/25/2023 Cycle 9 FOLFIRINOX 10/09/2023 Cycle 10 FOLFIRINOX 10/22/2023 Cycle 11 FOLFIRINOX 11/05/2023 Cycle 12 FOLFIRINOX 11/20/2023 Cycle 13 FOLFIRINOX 12/04/2023 Cycle 14 FOLFIRINOX 12/19/2023 CTs at [facility] 12/23/2023: Stable to slight decrease in hepatic metastases, no new lesions, stable pancreas tail mass MRI liver at [facility] 12/26/2023: Multifocal nonenhancing hepatic metastases, pancreas tail mass not well-visualized Cycle 15 FOLFIRINOX 01/01/2024, 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 Cycle 16 FOLFIRINOX 01/15/2024, 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 Cycle 17 FOLFIRINOX 02/05/2024, 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 Cycle 18 FOLFIRINOX 02/19/2024, oxaliplatin  held secondary to  neuropathy Cycle 19 FOLFIRINOX 03/04/2024, 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 Cycle 20 FOLFIRINOX 03/18/2024, oxaliplatin  held secondary to  neuropathy CTs at [facility] 03/27/2024: Stable hepatic metastases, no new hepatic lesion, pancreas tail lesion not well-visualized unchanged right lower lobe groundglass opacity Cycle 21 FOLFIRINOX 04/01/2024, 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 Cycle 22 FOLFIRINOX 04/15/2024, 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  Resolved with Reglan .  Recurrent hiccups following cycle 4 FOLFIRINOX-not relieved with Reglan       Disposition: David [name] appears stable.  He continues FOLFIRI every 2 weeks.  Oxaliplatin  has been placed on hold due to neuropathy.  There is no clinical evidence of disease progression.  He is tolerating treatment well.  Plan to continue the same, cycle 22 today.  CBC and chemistry panel reviewed.  Labs adequate to proceed as above.  He will return for follow-up and treatment in 2 weeks.  We are available to see him sooner if needed.    Diana Forster ANP/GNP-BC   04/15/2024  9:45 AM

## 2024-04-16 ENCOUNTER — Other Ambulatory Visit: Payer: Self-pay

## 2024-04-16 ENCOUNTER — Encounter: Payer: Self-pay | Admitting: Oncology

## 2024-04-16 LAB — CANCER ANTIGEN 19-9: CA 19-9: 52 U/mL — ABNORMAL HIGH (ref 0–35)

## 2024-04-17 ENCOUNTER — Inpatient Hospital Stay: Attending: Hematology

## 2024-04-17 VITALS — BP 122/79 | HR 89 | Temp 98.8°F | Resp 18

## 2024-04-17 DIAGNOSIS — Z800 Family history of malignant neoplasm of digestive organs: Secondary | ICD-10-CM | POA: Insufficient documentation

## 2024-04-17 DIAGNOSIS — C259 Malignant neoplasm of pancreas, unspecified: Secondary | ICD-10-CM

## 2024-04-17 DIAGNOSIS — Z5111 Encounter for antineoplastic chemotherapy: Secondary | ICD-10-CM | POA: Insufficient documentation

## 2024-04-17 DIAGNOSIS — Z452 Encounter for adjustment and management of vascular access device: Secondary | ICD-10-CM | POA: Diagnosis not present

## 2024-04-17 DIAGNOSIS — K5900 Constipation, unspecified: Secondary | ICD-10-CM | POA: Insufficient documentation

## 2024-04-17 DIAGNOSIS — Z5189 Encounter for other specified aftercare: Secondary | ICD-10-CM | POA: Diagnosis not present

## 2024-04-17 DIAGNOSIS — C252 Malignant neoplasm of tail of pancreas: Secondary | ICD-10-CM | POA: Diagnosis present

## 2024-04-17 DIAGNOSIS — G629 Polyneuropathy, unspecified: Secondary | ICD-10-CM | POA: Diagnosis not present

## 2024-04-17 DIAGNOSIS — C787 Secondary malignant neoplasm of liver and intrahepatic bile duct: Secondary | ICD-10-CM | POA: Diagnosis present

## 2024-04-17 MED ORDER — SODIUM CHLORIDE 0.9% FLUSH
10.0000 mL | INTRAVENOUS | Status: DC | PRN
  Administered 2024-04-17: 10 mL

## 2024-04-17 MED ORDER — HEPARIN SOD (PORK) LOCK FLUSH 100 UNIT/ML IV SOLN
500.0000 [IU] | Freq: Once | INTRAVENOUS | Status: AC | PRN
  Administered 2024-04-17: 500 [IU]

## 2024-04-17 MED ORDER — PEGFILGRASTIM-JMDB 6 MG/0.6ML ~~LOC~~ SOSY
6.0000 mg | PREFILLED_SYRINGE | Freq: Once | SUBCUTANEOUS | Status: AC
Start: 2024-04-17 — End: 2024-04-17
  Administered 2024-04-17: 6 mg via SUBCUTANEOUS
  Filled 2024-04-17: qty 0.6

## 2024-04-17 NOTE — Patient Instructions (Signed)

## 2024-04-21 ENCOUNTER — Encounter: Payer: Self-pay | Admitting: Oncology

## 2024-04-24 ENCOUNTER — Encounter: Payer: Self-pay | Admitting: Oncology

## 2024-04-25 ENCOUNTER — Other Ambulatory Visit: Payer: Self-pay | Admitting: Oncology

## 2024-04-29 ENCOUNTER — Inpatient Hospital Stay (HOSPITAL_BASED_OUTPATIENT_CLINIC_OR_DEPARTMENT_OTHER): Admitting: Nurse Practitioner

## 2024-04-29 ENCOUNTER — Inpatient Hospital Stay

## 2024-04-29 ENCOUNTER — Encounter: Payer: Self-pay | Admitting: Nurse Practitioner

## 2024-04-29 VITALS — BP 127/69 | HR 74 | Resp 18

## 2024-04-29 VITALS — BP 120/72 | HR 69 | Temp 98.2°F | Resp 18 | Ht 68.0 in | Wt 159.6 lb

## 2024-04-29 DIAGNOSIS — C787 Secondary malignant neoplasm of liver and intrahepatic bile duct: Secondary | ICD-10-CM

## 2024-04-29 DIAGNOSIS — Z95828 Presence of other vascular implants and grafts: Secondary | ICD-10-CM

## 2024-04-29 DIAGNOSIS — C259 Malignant neoplasm of pancreas, unspecified: Secondary | ICD-10-CM

## 2024-04-29 DIAGNOSIS — Z5111 Encounter for antineoplastic chemotherapy: Secondary | ICD-10-CM | POA: Diagnosis not present

## 2024-04-29 LAB — CMP (CANCER CENTER ONLY)
ALT: 42 U/L (ref 0–44)
AST: 30 U/L (ref 15–41)
Albumin: 4.2 g/dL (ref 3.5–5.0)
Alkaline Phosphatase: 243 U/L — ABNORMAL HIGH (ref 38–126)
Anion gap: 12 (ref 5–15)
BUN: 21 mg/dL — ABNORMAL HIGH (ref 6–20)
CO2: 23 mmol/L (ref 22–32)
Calcium: 9.9 mg/dL (ref 8.9–10.3)
Chloride: 103 mmol/L (ref 98–111)
Creatinine: 0.93 mg/dL (ref 0.61–1.24)
GFR, Estimated: >60 mL/min (ref >60)
Glucose, Bld: 197 mg/dL — ABNORMAL HIGH (ref 70–99)
Potassium: 4.2 mmol/L (ref 3.5–5.1)
Sodium: 138 mmol/L (ref 135–145)
Total Bilirubin: 0.5 mg/dL (ref 0.0–1.2)
Total Protein: 6.9 g/dL (ref 6.5–8.1)

## 2024-04-29 LAB — CBC WITH DIFFERENTIAL (CANCER CENTER ONLY)
Abs Immature Granulocytes: 0.14 10*3/uL — ABNORMAL HIGH (ref 0.00–0.07)
Basophils Absolute: 0 10*3/uL (ref 0.0–0.1)
Basophils Relative: 1 %
Eosinophils Absolute: 0.1 10*3/uL (ref 0.0–0.5)
Eosinophils Relative: 2 %
HCT: 33.6 % — ABNORMAL LOW (ref 39.0–52.0)
Hemoglobin: 11.2 g/dL — ABNORMAL LOW (ref 13.0–17.0)
Immature Granulocytes: 3 %
Lymphocytes Relative: 14 %
Lymphs Abs: 0.8 10*3/uL (ref 0.7–4.0)
MCH: 35 pg — ABNORMAL HIGH (ref 26.0–34.0)
MCHC: 33.3 g/dL (ref 30.0–36.0)
MCV: 105 fL — ABNORMAL HIGH (ref 80.0–100.0)
Monocytes Absolute: 0.7 10*3/uL (ref 0.1–1.0)
Monocytes Relative: 13 %
Neutro Abs: 3.8 10*3/uL (ref 1.7–7.7)
Neutrophils Relative %: 67 %
Platelet Count: 147 10*3/uL — ABNORMAL LOW (ref 150–400)
RBC: 3.2 MIL/uL — ABNORMAL LOW (ref 4.22–5.81)
RDW: 13.3 % (ref 11.5–15.5)
WBC Count: 5.6 10*3/uL (ref 4.0–10.5)
nRBC: 0 % (ref 0.0–0.2)

## 2024-04-29 MED ORDER — HYDROCODONE-ACETAMINOPHEN 5-325 MG PO TABS: 0.5000 | ORAL_TABLET | Freq: Four times a day (QID) | ORAL | 0 refills | Status: DC | PRN

## 2024-04-29 MED ORDER — ATROPINE SULFATE 1 MG/ML IV SOLN
0.5000 mg | Freq: Once | INTRAVENOUS | Status: AC | PRN
  Administered 2024-04-29: 0.5 mg via INTRAVENOUS
  Filled 2024-04-29: qty 1

## 2024-04-29 MED ORDER — SODIUM CHLORIDE 0.9 % IV SOLN
400.0000 mg/m2 | Freq: Once | INTRAVENOUS | Status: AC
  Administered 2024-04-29: 744 mg via INTRAVENOUS
  Filled 2024-04-29: qty 37.2

## 2024-04-29 MED ORDER — SODIUM CHLORIDE 0.9% FLUSH
10.0000 mL | Freq: Once | INTRAVENOUS | Status: AC
  Administered 2024-04-29: 10 mL via INTRAVENOUS

## 2024-04-29 MED ORDER — SODIUM CHLORIDE 0.9 % IV SOLN: Freq: Once | INTRAVENOUS | Status: AC

## 2024-04-29 MED ORDER — SODIUM CHLORIDE 0.9 % IV SOLN
2400.0000 mg/m2 | INTRAVENOUS | Status: DC
  Administered 2024-04-29: 4450 mg via INTRAVENOUS
  Filled 2024-04-29: qty 89

## 2024-04-29 MED ORDER — SODIUM CHLORIDE 0.9 % IV SOLN
150.0000 mg/m2 | Freq: Once | INTRAVENOUS | Status: AC
  Administered 2024-04-29: 300 mg via INTRAVENOUS
  Filled 2024-04-29: qty 15

## 2024-04-29 MED ORDER — DEXAMETHASONE SODIUM PHOSPHATE 10 MG/ML IJ SOLN
5.0000 mg | Freq: Once | INTRAMUSCULAR | Status: AC
  Administered 2024-04-29: 5 mg via INTRAVENOUS
  Filled 2024-04-29: qty 1

## 2024-04-29 MED ORDER — PALONOSETRON HCL INJECTION 0.25 MG/5ML
0.2500 mg | Freq: Once | INTRAVENOUS | Status: AC
  Administered 2024-04-29: 0.25 mg via INTRAVENOUS
  Filled 2024-04-29: qty 5

## 2024-04-29 MED ORDER — SODIUM CHLORIDE 0.9 % IV SOLN
150.0000 mg | Freq: Once | INTRAVENOUS | Status: AC
  Administered 2024-04-29: 150 mg via INTRAVENOUS
  Filled 2024-04-29: qty 150

## 2024-04-29 NOTE — Progress Notes (Signed)
 Patient presents today for chemotherapy infusion Leucovorin , Irinotecan , and Adrucil . Patient is in satisfactory condition with no new complaints voiced.  Vital signs are stable.  Labs reviewed by Dr. Coni Deep during the office visit and all labs are within treatment parameters.  We will proceed with treatment per MD orders.   Patient tolerated treatment well with no complaints voiced.  Patient left ambulatory in stable condition.  Vital signs stable at discharge.  Follow up as scheduled.

## 2024-04-29 NOTE — Progress Notes (Signed)
 Patient seen by Lonna Cobb NP today  Vitals are within treatment parameters:Yes   Labs are within treatment parameters: Yes   Treatment plan has been signed: Yes   Per physician team, Patient is ready for treatment and there are NO modifications to the treatment plan.

## 2024-04-29 NOTE — Patient Instructions (Signed)

## 2024-04-29 NOTE — Patient Instructions (Signed)
 [facility] - A DEPT OF [facility]  Discharge Instructions: Thank you for choosing [facility] to provide your oncology and hematology care.   If you have a lab appointment with the Cancer Center, please go directly to the Cancer Center and check in at the registration area.   Wear comfortable clothing and clothing appropriate for easy access to any Portacath or PICC line.   We strive to give you quality time with your provider. You may need to reschedule your appointment if you arrive late (15 or more minutes).  Arriving late affects you and other patients whose appointments are after yours.  Also, if you miss three or more appointments without notifying the office, you may be dismissed from the clinic at the provider's discretion.      For prescription refill requests, have your pharmacy contact our office and allow 72 hours for refills to be completed.    Today you received the following chemotherapy and/or immunotherapy agents Leucovorin , Irinotecan , and Adrucil .  Leucovorin  Injection What is this medication? LEUCOVORIN  (loo koe VOR in) prevents side effects from certain medications, such as methotrexate. It works by increasing folate levels. This helps protect healthy cells in your body. It may also be used to treat anemia caused by low levels of folate. It can also be used with fluorouracil , a type of chemotherapy, to treat colorectal cancer. It works by increasing the effects of fluorouracil  in the body. This medicine may be used for other purposes; ask your health care provider or pharmacist if you have questions. What should I tell my care team before I take this medication? They need to know if you have any of these conditions: Anemia from low levels of vitamin B12 in the blood An unusual or allergic reaction to leucovorin , folic acid, other medications, foods, dyes, or preservatives Pregnant or trying to get pregnant Breastfeeding How  should I use this medication? This medication is injected into a vein or a muscle. It is given by your care team in a hospital or clinic setting. Talk to your care team about the use of this medication in children. Special care may be needed. Overdosage: If you think you have taken too much of this medicine contact a poison control center or emergency room at once. NOTE: This medicine is only for you. Do not share this medicine with others. What if I miss a dose? Keep appointments for follow-up doses. It is important not to miss your dose. Call your care team if you are unable to keep an appointment. What may interact with this medication? Capecitabine Fluorouracil  Phenobarbital Phenytoin Primidone Trimethoprim;sulfamethoxazole This list may not describe all possible interactions. Give your health care provider a list of all the medicines, herbs, non-prescription drugs, or dietary supplements you use. Also tell them if you smoke, drink alcohol, or use illegal drugs. Some items may interact with your medicine. What should I watch for while using this medication? Your condition will be monitored carefully while you are receiving this medication. This medication may increase the side effects of 5-fluorouracil . Tell your care team if you have diarrhea or mouth sores that do not get better or that get worse. What side effects may I notice from receiving this medication? Side effects that you should report to your care team as soon as possible: Allergic reactions--skin rash, itching, hives, swelling of the face, lips, tongue, or throat This list may not describe all possible side effects. Call your doctor for  medical advice about side effects. You may report side effects to FDA at 1-800-FDA-1088. Where should I keep my medication? This medication is given in a hospital or clinic. It will not be stored at home. NOTE: This sheet is a summary. It may not cover all possible information. If you have  questions about this medicine, talk to your doctor, pharmacist, or health care provider.  2024 Elsevier/Gold Standard (2022-05-08 00:00:00)  Irinotecan  Injection What is this medication? IRINOTECAN  (ir in oh TEE kan) treats some types of cancer. It works by slowing down the growth of cancer cells. This medicine may be used for other purposes; ask your health care provider or pharmacist if you have questions. COMMON BRAND NAME(S): Camptosar  What should I tell my care team before I take this medication? They need to know if you have any of these conditions: Dehydration Diarrhea Infection, especially a viral infection, such as chickenpox, cold sores, herpes Liver disease Low blood cell levels (white cells, red cells, and platelets) Low levels of electrolytes, such as calcium , magnesium, or potassium in your blood Recent or ongoing radiation An unusual or allergic reaction to irinotecan , other medications, foods, dyes, or preservatives If you or your partner are pregnant or trying to get pregnant Breast-feeding How should I use this medication? This medication is injected into a vein. It is given by your care team in a hospital or clinic setting. Talk to your care team about the use of this medication in children. Special care may be needed. Overdosage: If you think you have taken too much of this medicine contact a poison control center or emergency room at once. NOTE: This medicine is only for you. Do not share this medicine with others. What if I miss a dose? Keep appointments for follow-up doses. It is important not to miss your dose. Call your care team if you are unable to keep an appointment. What may interact with this medication? Do not take this medication with any of the following: Cobicistat Itraconazole This medication may also interact with the following: Certain antibiotics, such as clarithromycin, rifampin, rifabutin Certain antivirals for HIV or AIDS Certain  medications for fungal infections, such as ketoconazole, posaconazole, voriconazole Certain medications for seizures, such as carbamazepine, phenobarbital, phenytoin Gemfibrozil Nefazodone St. John's wort This list may not describe all possible interactions. Give your health care provider a list of all the medicines, herbs, non-prescription drugs, or dietary supplements you use. Also tell them if you smoke, drink alcohol, or use illegal drugs. Some items may interact with your medicine. What should I watch for while using this medication? Your condition will be monitored carefully while you are receiving this medication. You may need blood work while taking this medication. This medication may make you feel generally unwell. This is not uncommon as chemotherapy can affect healthy cells as well as cancer cells. Report any side effects. Continue your course of treatment even though you feel ill unless your care team tells you to stop. This medication can cause serious side effects. To reduce the risk, your care team may give you other medications to take before receiving this one. Be sure to follow the directions from your care team. This medication may affect your coordination, reaction time, or judgement. Do not drive or operate machinery until you know how this medication affects you. Sit up or stand slowly to reduce the risk of dizzy or fainting spells. Drinking alcohol with this medication can increase the risk of these side effects. This medication may increase  your risk of getting an infection. Call your care team for advice if you get a fever, chills, sore throat, or other symptoms of a cold or flu. Do not treat yourself. Try to avoid being around people who are sick. Avoid taking medications that contain aspirin, acetaminophen , ibuprofen, naproxen, or ketoprofen unless instructed by your care team. These medications may hide a fever. This medication may increase your risk to bruise or bleed. Call  your care team if you notice any unusual bleeding. Be careful brushing or flossing your teeth or using a toothpick because you may get an infection or bleed more easily. If you have any dental work done, tell your dentist you are receiving this medication. Talk to your care team if you or your partner are pregnant or think either of you might be pregnant. This medication can cause serious birth defects if taken during pregnancy and for 6 months after the last dose. You will need a negative pregnancy test before starting this medication. Contraception is recommended while taking this medication and for 6 months after the last dose. Your care team can help you find the option that works for you. Do not father a child while taking this medication and for 3 months after the last dose. Use a condom for contraception during this time period. Do not breastfeed while taking this medication and for 7 days after the last dose. This medication may cause infertility. Talk to your care team if you are concerned about your fertility. What side effects may I notice from receiving this medication? Side effects that you should report to your care team as soon as possible: Allergic reactions--skin rash, itching, hives, swelling of the face, lips, tongue, or throat Dry cough, shortness of breath or trouble breathing Increased saliva or tears, increased sweating, stomach cramping, diarrhea, small pupils, unusual weakness or fatigue, slow heartbeat Infection--fever, chills, cough, sore throat, wounds that don't heal, pain or trouble when passing urine, general feeling of discomfort or being unwell Kidney injury--decrease in the amount of urine, swelling of the ankles, hands, or feet Low red blood cell level--unusual weakness or fatigue, dizziness, headache, trouble breathing Severe or prolonged diarrhea Unusual bruising or bleeding Side effects that usually do not require medical attention (report to your care team if  they continue or are bothersome): Constipation Diarrhea Hair loss Loss of appetite Nausea Stomach pain This list may not describe all possible side effects. Call your doctor for medical advice about side effects. You may report side effects to FDA at 1-800-FDA-1088. Where should I keep my medication? This medication is given in a hospital or clinic. It will not be stored at home. NOTE: This sheet is a summary. It may not cover all possible information. If you have questions about this medicine, talk to your doctor, pharmacist, or health care provider.  2024 Elsevier/Gold Standard (2022-04-16 00:00:00)  Fluorouracil  Injection What is this medication? FLUOROURACIL  (flure oh YOOR a sil) treats some types of cancer. It works by slowing down the growth of cancer cells. This medicine may be used for other purposes; ask your health care provider or pharmacist if you have questions. COMMON BRAND NAME(S): Adrucil  What should I tell my care team before I take this medication? They need to know if you have any of these conditions: Blood disorders Dihydropyrimidine dehydrogenase (DPD) deficiency Infection, such as chickenpox, cold sores, herpes Kidney disease Liver disease Poor nutrition Recent or ongoing radiation therapy An unusual or allergic reaction to fluorouracil , other medications, foods, dyes, or  preservatives If you or your partner are pregnant or trying to get pregnant Breast-feeding How should I use this medication? This medication is injected into a vein. It is administered by your care team in a hospital or clinic setting. Talk to your care team about the use of this medication in children. Special care may be needed. Overdosage: If you think you have taken too much of this medicine contact a poison control center or emergency room at once. NOTE: This medicine is only for you. Do not share this medicine with others. What if I miss a dose? Keep appointments for follow-up doses.  It is important not to miss your dose. Call your care team if you are unable to keep an appointment. What may interact with this medication? Do not take this medication with any of the following: Live virus vaccines This medication may also interact with the following: Medications that treat or prevent blood clots, such as warfarin, enoxaparin, dalteparin This list may not describe all possible interactions. Give your health care provider a list of all the medicines, herbs, non-prescription drugs, or dietary supplements you use. Also tell them if you smoke, drink alcohol, or use illegal drugs. Some items may interact with your medicine. What should I watch for while using this medication? Your condition will be monitored carefully while you are receiving this medication. This medication may make you feel generally unwell. This is not uncommon as chemotherapy can affect healthy cells as well as cancer cells. Report any side effects. Continue your course of treatment even though you feel ill unless your care team tells you to stop. In some cases, you may be given additional medications to help with side effects. Follow all directions for their use. This medication may increase your risk of getting an infection. Call your care team for advice if you get a fever, chills, sore throat, or other symptoms of a cold or flu. Do not treat yourself. Try to avoid being around people who are sick. This medication may increase your risk to bruise or bleed. Call your care team if you notice any unusual bleeding. Be careful brushing or flossing your teeth or using a toothpick because you may get an infection or bleed more easily. If you have any dental work done, tell your dentist you are receiving this medication. Avoid taking medications that contain aspirin, acetaminophen , ibuprofen, naproxen, or ketoprofen unless instructed by your care team. These medications may hide a fever. Do not treat diarrhea with over the  counter products. Contact your care team if you have diarrhea that lasts more than 2 days or if it is severe and watery. This medication can make you more sensitive to the sun. Keep out of the sun. If you cannot avoid being in the sun, wear protective clothing and sunscreen. Do not use sun lamps, tanning beds, or tanning booths. Talk to your care team if you or your partner wish to become pregnant or think you might be pregnant. This medication can cause serious birth defects if taken during pregnancy and for 3 months after the last dose. A reliable form of contraception is recommended while taking this medication and for 3 months after the last dose. Talk to your care team about effective forms of contraception. Do not father a child while taking this medication and for 3 months after the last dose. Use a condom while having sex during this time period. Do not breastfeed while taking this medication. This medication may cause infertility. Talk to your  care team if you are concerned about your fertility. What side effects may I notice from receiving this medication? Side effects that you should report to your care team as soon as possible: Allergic reactions--skin rash, itching, hives, swelling of the face, lips, tongue, or throat Heart attack--pain or tightness in the chest, shoulders, arms, or jaw, nausea, shortness of breath, cold or clammy skin, feeling faint or lightheaded Heart failure--shortness of breath, swelling of the ankles, feet, or hands, sudden weight gain, unusual weakness or fatigue Heart rhythm changes--fast or irregular heartbeat, dizziness, feeling faint or lightheaded, chest pain, trouble breathing High ammonia level--unusual weakness or fatigue, confusion, loss of appetite, nausea, vomiting, seizures Infection--fever, chills, cough, sore throat, wounds that don't heal, pain or trouble when passing urine, general feeling of discomfort or being unwell Low red blood cell  level--unusual weakness or fatigue, dizziness, headache, trouble breathing Pain, tingling, or numbness in the hands or feet, muscle weakness, change in vision, confusion or trouble speaking, loss of balance or coordination, trouble walking, seizures Redness, swelling, and blistering of the skin over hands and feet Severe or prolonged diarrhea Unusual bruising or bleeding Side effects that usually do not require medical attention (report to your care team if they continue or are bothersome): Dry skin Headache Increased tears Nausea Pain, redness, or swelling with sores inside the mouth or throat Sensitivity to light Vomiting This list may not describe all possible side effects. Call your doctor for medical advice about side effects. You may report side effects to FDA at 1-800-FDA-1088. Where should I keep my medication? This medication is given in a hospital or clinic. It will not be stored at home. NOTE: This sheet is a summary. It may not cover all possible information. If you have questions about this medicine, talk to your doctor, pharmacist, or health care provider.  2024 Elsevier/Gold Standard (2022-04-10 00:00:00)      To help prevent nausea and vomiting after your treatment, we encourage you to take your nausea medication as directed.  BELOW ARE SYMPTOMS THAT SHOULD BE REPORTED IMMEDIATELY: *FEVER GREATER THAN 100.4 F (38 C) OR HIGHER *CHILLS OR SWEATING *NAUSEA AND VOMITING THAT IS NOT CONTROLLED WITH YOUR NAUSEA MEDICATION *UNUSUAL SHORTNESS OF BREATH *UNUSUAL BRUISING OR BLEEDING *URINARY PROBLEMS (pain or burning when urinating, or frequent urination) *BOWEL PROBLEMS (unusual diarrhea, constipation, pain near the anus) TENDERNESS IN MOUTH AND THROAT WITH OR WITHOUT PRESENCE OF ULCERS (sore throat, sores in mouth, or a toothache) UNUSUAL RASH, SWELLING OR PAIN  UNUSUAL VAGINAL DISCHARGE OR ITCHING   Items with * indicate a potential emergency and should be followed up as  soon as possible or go to the Emergency Department if any problems should occur.  Please show the CHEMOTHERAPY ALERT CARD or IMMUNOTHERAPY ALERT CARD at check-in to the Emergency Department and triage nurse.  Should you have questions after your visit or need to cancel or reschedule your appointment, please contact [facility] - A DEPT OF [facility]  Dept: [phone]  and follow the prompts.  Office hours are 8:00 a.m. to 4:30 p.m. Monday - Friday. Please note that voicemails left after 4:00 p.m. may not be returned until the following business day.  We are closed weekends and major holidays. You have access to a nurse at all times for urgent questions. Please call the main number to the clinic Dept: [phone] and follow the prompts.   For any non-urgent questions, you may also contact your provider using MyChart. We  now offer e-Visits for anyone 34 and older to request care online for non-urgent symptoms. For details visit [email or web address].   Also download the MyChart app! Go to the app store, search "MyChart", open the app, select [place], and log in with your MyChart username and password.

## 2024-04-29 NOTE — Progress Notes (Signed)
 [facility] OFFICE PROGRESS NOTE   Diagnosis:  Pancreas cancer  INTERVAL HISTORY:   David [name] returns as scheduled.  He completed another cycle of FOLFIRI 04/15/2024.  He denies nausea.  No mouth sores.  He had less diarrhea.  He thinks he took an antidiarrheal medication once.  Constipation last week.  Stable neuropathy symptoms.  He has a good appetite.  He has occasional bilateral upper leg pain after Fulphila .  He takes half of a Vicodin tablet if needed with good relief.  Objective:  Vital signs in last 24 hours:  Blood pressure 120/72, pulse 69, temperature 98.2 F (36.8 C), temperature source Temporal, resp. rate 18, height 5\' 8"  (1.727 m), weight 159 lb 9.6 oz (72.4 kg), SpO2 100%.    HEENT: No thrush or ulcers. Resp: Lungs clear bilaterally. Cardio: Regular rate and rhythm. GI: Abdomen soft and nontender.  No hepatosplenomegaly. Vascular: No leg edema. Skin: Palms without erythema. Port-A-Cath without erythema.  Lab Results:  Lab Results  Component Value Date   WBC 5.6 04/29/2024   HGB 11.2 (L) 04/29/2024   HCT 33.6 (L) 04/29/2024   MCV 105.0 (H) 04/29/2024   PLT 147 (L) 04/29/2024   NEUTROABS 3.8 04/29/2024    Imaging:  No results found.  Medications: I have reviewed the patient's current medications.  Assessment/Plan: Pancreas cancer CT angiogram chest 04/24/2023-low-density mass in the dome of the liver with at least 3 additional lesions in both hepatic lobes, borderline lymphadenopathy in the hepatoduodenal ligament Right upper quadrant ultrasound 04/24/2023-large circumscribed mass of the liver dome CT abdomen/pelvis 04/24/2023-multiple liver lesions poorly defined on noncontrast exam, prominent portacaval and porta hepatis nodes MRI abdomen 05/05/2023-multiple rim-hypoenhancing liver lesions consistent with hepatic metastases, suspicion for a pancreas tail mass Ultrasound-guided biopsy of dominant right liver lesion 05/10/2023-adenocarcinoma,  CK7 and CDX2 positive, abundant cytoplasm and mucin with extracellular mucin, foci of lymphovascular invasion Tempus gene panel-K-ras G12V, T P53 mild tumor mutation burden 3.2, MSS PET 06/04/2023-multiple hypermetabolic liver lesions consistent with metastases, low-level FDG uptake in the soft tissue fullness at the tip of the pancreas tail, low-level FDG activity involving a small soft tissue nodule between the gastric fundus and spleen, tiny foci of accumulation at the right costovertebral junction at T10 and roof of the left acetabulum without an underlying CT lesion Elevated CEA and CA 19-9 EUS 06/07/2023 ,22x 15 mm irregular mass in the tail the pancreas, 11 mm subcarinal lymph node, FNA biopsy of the pancreas mass-suspicious for malignancy Cycle 1 FOLFIRINOX 06/19/2023 Cycle 2 FOLFIRINOX 07/03/2023 Cycle 3 FOLFIRINOX 07/17/2023 Cycle 4 FOLFIRINOX 07/31/2023 Cycle 5 FOLFIRINOX 08/14/2023, oxaliplatin  and Decadron  dose reduced Cycle 6 FOLFIRINOX 08/28/2023 Cycle 7 FOLFIRINOX 09/11/2023 CTs at [facility] 09/16/2023, compared to 04/24/2023-unchanged pancreas tail mass, decrease in attenuation of hepatic metastases, several lesions have decreased in size, no new lesions Cycle 8 FOLFIRINOX 09/25/2023 Cycle 9 FOLFIRINOX 10/09/2023 Cycle 10 FOLFIRINOX 10/22/2023 Cycle 11 FOLFIRINOX 11/05/2023 Cycle 12 FOLFIRINOX 11/20/2023 Cycle 13 FOLFIRINOX 12/04/2023 Cycle 14 FOLFIRINOX 12/19/2023 CTs at [facility] 12/23/2023: Stable to slight decrease in hepatic metastases, no new lesions, stable pancreas tail mass MRI liver at [facility] 12/26/2023: Multifocal nonenhancing hepatic metastases, pancreas tail mass not well-visualized Cycle 15 FOLFIRINOX 01/01/2024, 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 Cycle 16 FOLFIRINOX 01/15/2024, 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 Cycle 17 FOLFIRINOX 02/05/2024, 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 Cycle 18 FOLFIRINOX 02/19/2024, 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 Cycle 19 FOLFIRINOX 03/04/2024,  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 Cycle 20 FOLFIRINOX 03/18/2024, 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  CTs at [facility] 03/27/2024: Stable hepatic metastases, no new hepatic lesion, pancreas tail lesion not well-visualized unchanged right lower lobe groundglass opacity Cycle 21 FOLFIRINOX 04/01/2024, 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 Cycle 22 FOLFIRINOX 04/15/2024, 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 Cycle 23 FOLFIRINOX 04/29/2024, 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  Resolved with Reglan .  Recurrent hiccups following cycle 4 FOLFIRINOX-not relieved with Reglan     Disposition: David [name] appears stable.  He has completed 22 cycles of systemic therapy.  Oxaliplatin  has been deleted from the regimen due to neuropathy.  He continues FOLFIRI.  He is tolerating treatment well.  There is no clinical evidence of disease progression.  Most recent CA 19-9 with continued improvement.  Plan to proceed with FOLFIRI today as scheduled.  CBC and chemistry panel reviewed.  Labs adequate to proceed as above.  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  A new prescription was sent to his pharmacy today.  He will return for follow-up in 3 weeks rather than 2 per his request due to a vacation.  We are available to see him sooner if needed.    Diana Forster ANP/GNP-BC   04/29/2024  8:24 AM

## 2024-04-30 ENCOUNTER — Other Ambulatory Visit: Payer: Self-pay

## 2024-04-30 LAB — CANCER ANTIGEN 19-9: CA 19-9: 48 U/mL — ABNORMAL HIGH (ref 0–35)

## 2024-05-01 ENCOUNTER — Inpatient Hospital Stay

## 2024-05-01 VITALS — BP 139/79 | HR 80 | Temp 98.5°F | Resp 18

## 2024-05-01 DIAGNOSIS — Z5111 Encounter for antineoplastic chemotherapy: Secondary | ICD-10-CM | POA: Diagnosis not present

## 2024-05-01 DIAGNOSIS — C259 Malignant neoplasm of pancreas, unspecified: Secondary | ICD-10-CM

## 2024-05-01 MED ORDER — SODIUM CHLORIDE 0.9% FLUSH
10.0000 mL | INTRAVENOUS | Status: DC | PRN
Start: 2024-05-01 — End: 2024-05-01
  Administered 2024-05-01: 10 mL

## 2024-05-01 MED ORDER — PEGFILGRASTIM-JMDB 6 MG/0.6ML ~~LOC~~ SOSY
6.0000 mg | PREFILLED_SYRINGE | Freq: Once | SUBCUTANEOUS | Status: AC
  Administered 2024-05-01: 6 mg via SUBCUTANEOUS
  Filled 2024-05-01: qty 0.6

## 2024-05-01 MED ORDER — HEPARIN SOD (PORK) LOCK FLUSH 100 UNIT/ML IV SOLN
500.0000 [IU] | Freq: Once | INTRAVENOUS | Status: AC | PRN
  Administered 2024-05-01: 500 [IU]

## 2024-05-01 NOTE — Patient Instructions (Signed)

## 2024-05-05 ENCOUNTER — Encounter: Payer: Self-pay | Admitting: Oncology

## 2024-05-14 ENCOUNTER — Other Ambulatory Visit: Payer: Self-pay | Admitting: Oncology

## 2024-05-19 ENCOUNTER — Encounter: Payer: Self-pay | Admitting: Oncology

## 2024-05-20 ENCOUNTER — Inpatient Hospital Stay

## 2024-05-20 ENCOUNTER — Inpatient Hospital Stay (HOSPITAL_BASED_OUTPATIENT_CLINIC_OR_DEPARTMENT_OTHER): Admitting: Nurse Practitioner

## 2024-05-20 ENCOUNTER — Inpatient Hospital Stay: Attending: Hematology

## 2024-05-20 ENCOUNTER — Encounter: Payer: Self-pay | Admitting: Nurse Practitioner

## 2024-05-20 VITALS — BP 120/68 | HR 69

## 2024-05-20 VITALS — BP 124/84 | HR 80 | Temp 98.1°F | Resp 18 | Ht 68.0 in | Wt 158.1 lb

## 2024-05-20 DIAGNOSIS — C259 Malignant neoplasm of pancreas, unspecified: Secondary | ICD-10-CM

## 2024-05-20 DIAGNOSIS — C787 Secondary malignant neoplasm of liver and intrahepatic bile duct: Secondary | ICD-10-CM | POA: Diagnosis present

## 2024-05-20 DIAGNOSIS — R197 Diarrhea, unspecified: Secondary | ICD-10-CM | POA: Insufficient documentation

## 2024-05-20 DIAGNOSIS — Z5189 Encounter for other specified aftercare: Secondary | ICD-10-CM | POA: Insufficient documentation

## 2024-05-20 DIAGNOSIS — G629 Polyneuropathy, unspecified: Secondary | ICD-10-CM | POA: Diagnosis not present

## 2024-05-20 DIAGNOSIS — Z452 Encounter for adjustment and management of vascular access device: Secondary | ICD-10-CM | POA: Insufficient documentation

## 2024-05-20 DIAGNOSIS — Z5111 Encounter for antineoplastic chemotherapy: Secondary | ICD-10-CM | POA: Insufficient documentation

## 2024-05-20 DIAGNOSIS — Z800 Family history of malignant neoplasm of digestive organs: Secondary | ICD-10-CM | POA: Insufficient documentation

## 2024-05-20 DIAGNOSIS — C252 Malignant neoplasm of tail of pancreas: Secondary | ICD-10-CM | POA: Diagnosis present

## 2024-05-20 LAB — CBC WITH DIFFERENTIAL (CANCER CENTER ONLY)
Abs Immature Granulocytes: 0.08 10*3/uL — ABNORMAL HIGH (ref 0.00–0.07)
Basophils Absolute: 0.1 10*3/uL (ref 0.0–0.1)
Basophils Relative: 1 %
Eosinophils Absolute: 0.2 10*3/uL (ref 0.0–0.5)
Eosinophils Relative: 3 %
HCT: 37.4 % — ABNORMAL LOW (ref 39.0–52.0)
Hemoglobin: 12.5 g/dL — ABNORMAL LOW (ref 13.0–17.0)
Immature Granulocytes: 1 %
Lymphocytes Relative: 18 %
Lymphs Abs: 1.1 10*3/uL (ref 0.7–4.0)
MCH: 34.5 pg — ABNORMAL HIGH (ref 26.0–34.0)
MCHC: 33.4 g/dL (ref 30.0–36.0)
MCV: 103.3 fL — ABNORMAL HIGH (ref 80.0–100.0)
Monocytes Absolute: 0.7 10*3/uL (ref 0.1–1.0)
Monocytes Relative: 12 %
Neutro Abs: 3.9 10*3/uL (ref 1.7–7.7)
Neutrophils Relative %: 65 %
Platelet Count: 184 10*3/uL (ref 150–400)
RBC: 3.62 MIL/uL — ABNORMAL LOW (ref 4.22–5.81)
RDW: 12.7 % (ref 11.5–15.5)
WBC Count: 6 10*3/uL (ref 4.0–10.5)
nRBC: 0 % (ref 0.0–0.2)

## 2024-05-20 LAB — CMP (CANCER CENTER ONLY)
ALT: 34 U/L (ref 0–44)
AST: 35 U/L (ref 15–41)
Albumin: 4.5 g/dL (ref 3.5–5.0)
Alkaline Phosphatase: 182 U/L — ABNORMAL HIGH (ref 38–126)
Anion gap: 15 (ref 5–15)
BUN: 16 mg/dL (ref 6–20)
CO2: 21 mmol/L — ABNORMAL LOW (ref 22–32)
Calcium: 10.2 mg/dL (ref 8.9–10.3)
Chloride: 99 mmol/L (ref 98–111)
Creatinine: 0.87 mg/dL (ref 0.61–1.24)
GFR, Estimated: >60 mL/min (ref >60)
Glucose, Bld: 184 mg/dL — ABNORMAL HIGH (ref 70–99)
Potassium: 4.3 mmol/L (ref 3.5–5.1)
Sodium: 135 mmol/L (ref 135–145)
Total Bilirubin: 1.2 mg/dL (ref 0.0–1.2)
Total Protein: 7.7 g/dL (ref 6.5–8.1)

## 2024-05-20 MED ORDER — SODIUM CHLORIDE 0.9 % IV SOLN
150.0000 mg | Freq: Once | INTRAVENOUS | Status: AC
  Administered 2024-05-20: 150 mg via INTRAVENOUS
  Filled 2024-05-20: qty 150

## 2024-05-20 MED ORDER — DEXAMETHASONE SODIUM PHOSPHATE 10 MG/ML IJ SOLN
5.0000 mg | Freq: Once | INTRAMUSCULAR | Status: AC
  Administered 2024-05-20: 5 mg via INTRAVENOUS
  Filled 2024-05-20: qty 1

## 2024-05-20 MED ORDER — ONDANSETRON HCL 8 MG PO TABS: 8.0000 mg | ORAL_TABLET | Freq: Three times a day (TID) | ORAL | 3 refills | Status: DC | PRN

## 2024-05-20 MED ORDER — ATROPINE SULFATE 1 MG/ML IV SOLN
0.5000 mg | Freq: Once | INTRAVENOUS | Status: AC | PRN
  Administered 2024-05-20: 0.5 mg via INTRAVENOUS
  Filled 2024-05-20: qty 1

## 2024-05-20 MED ORDER — SODIUM CHLORIDE 0.9 % IV SOLN: INTRAVENOUS | Status: DC

## 2024-05-20 MED ORDER — SODIUM CHLORIDE 0.9 % IV SOLN
2400.0000 mg/m2 | INTRAVENOUS | Status: DC
  Administered 2024-05-20: 4450 mg via INTRAVENOUS
  Filled 2024-05-20: qty 89

## 2024-05-20 MED ORDER — SODIUM CHLORIDE 0.9 % IV SOLN
400.0000 mg/m2 | Freq: Once | INTRAVENOUS | Status: AC
  Administered 2024-05-20: 744 mg via INTRAVENOUS
  Filled 2024-05-20: qty 37.2

## 2024-05-20 MED ORDER — SODIUM CHLORIDE 0.9 % IV SOLN
150.0000 mg/m2 | Freq: Once | INTRAVENOUS | Status: AC
  Administered 2024-05-20: 300 mg via INTRAVENOUS
  Filled 2024-05-20: qty 15

## 2024-05-20 MED ORDER — PALONOSETRON HCL INJECTION 0.25 MG/5ML
0.2500 mg | Freq: Once | INTRAVENOUS | Status: AC
  Administered 2024-05-20: 0.25 mg via INTRAVENOUS
  Filled 2024-05-20: qty 5

## 2024-05-20 NOTE — Progress Notes (Signed)
 [facility] OFFICE PROGRESS NOTE   Diagnosis: Pancreas cancer  INTERVAL HISTORY:   David [name] returns as scheduled.  He completed another cycle of FOLFIRI 04/29/2024.  He denies nausea/vomiting.  No mouth sores.  Continued loose stools which he attributes to metformin.  No increase following chemotherapy.  He had hiccups for 1 or 2 days.  He takes Thorazine  as needed.  Stable neuropathy symptoms.  No abdominal pain.  He has a good appetite.  Objective:  Vital signs in last 24 hours:  Blood pressure 124/84, pulse 80, temperature 98.1 F (36.7 C), temperature source Temporal, resp. rate 18, height 5\' 8"  (1.727 m), weight 158 lb 1.6 oz (71.7 kg), SpO2 100%.    HEENT: No thrush or ulcers. Resp: Lungs clear bilaterally. Cardio: Regular rate and rhythm. GI: Abdomen soft and nontender.  No hepatosplenomegaly.  No mass. Vascular: No leg edema. Skin: Palms without erythema. Port-A-Cath without erythema.  Lab Results:  Lab Results  Component Value Date   WBC 6.0 05/20/2024   HGB 12.5 (L) 05/20/2024   HCT 37.4 (L) 05/20/2024   MCV 103.3 (H) 05/20/2024   PLT 184 05/20/2024   NEUTROABS 3.9 05/20/2024    Imaging:  No results found.  Medications: I have reviewed the patient's current medications.  Assessment/Plan: Pancreas cancer CT angiogram chest 04/24/2023-low-density mass in the dome of the liver with at least 3 additional lesions in both hepatic lobes, borderline lymphadenopathy in the hepatoduodenal ligament Right upper quadrant ultrasound 04/24/2023-large circumscribed mass of the liver dome CT abdomen/pelvis 04/24/2023-multiple liver lesions poorly defined on noncontrast exam, prominent portacaval and porta hepatis nodes MRI abdomen 05/05/2023-multiple rim-hypoenhancing liver lesions consistent with hepatic metastases, suspicion for a pancreas tail mass Ultrasound-guided biopsy of dominant right liver lesion 05/10/2023-adenocarcinoma, CK7 and CDX2 positive, abundant  cytoplasm and mucin with extracellular mucin, foci of lymphovascular invasion Tempus gene panel-K-ras G12V, T P53 mild tumor mutation burden 3.2, MSS PET 06/04/2023-multiple hypermetabolic liver lesions consistent with metastases, low-level FDG uptake in the soft tissue fullness at the tip of the pancreas tail, low-level FDG activity involving a small soft tissue nodule between the gastric fundus and spleen, tiny foci of accumulation at the right costovertebral junction at T10 and roof of the left acetabulum without an underlying CT lesion Elevated CEA and CA 19-9 EUS 06/07/2023 ,22x 15 mm irregular mass in the tail the pancreas, 11 mm subcarinal lymph node, FNA biopsy of the pancreas mass-suspicious for malignancy Cycle 1 FOLFIRINOX 06/19/2023 Cycle 2 FOLFIRINOX 07/03/2023 Cycle 3 FOLFIRINOX 07/17/2023 Cycle 4 FOLFIRINOX 07/31/2023 Cycle 5 FOLFIRINOX 08/14/2023, oxaliplatin  and Decadron  dose reduced Cycle 6 FOLFIRINOX 08/28/2023 Cycle 7 FOLFIRINOX 09/11/2023 CTs at [facility] 09/16/2023, compared to 04/24/2023-unchanged pancreas tail mass, decrease in attenuation of hepatic metastases, several lesions have decreased in size, no new lesions Cycle 8 FOLFIRINOX 09/25/2023 Cycle 9 FOLFIRINOX 10/09/2023 Cycle 10 FOLFIRINOX 10/22/2023 Cycle 11 FOLFIRINOX 11/05/2023 Cycle 12 FOLFIRINOX 11/20/2023 Cycle 13 FOLFIRINOX 12/04/2023 Cycle 14 FOLFIRINOX 12/19/2023 CTs at [facility] 12/23/2023: Stable to slight decrease in hepatic metastases, no new lesions, stable pancreas tail mass MRI liver at [facility] 12/26/2023: Multifocal nonenhancing hepatic metastases, pancreas tail mass not well-visualized Cycle 15 FOLFIRINOX 01/01/2024, 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 Cycle 16 FOLFIRINOX 01/15/2024, 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 Cycle 17 FOLFIRINOX 02/05/2024, 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 Cycle 18 FOLFIRINOX 02/19/2024, 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 Cycle 19 FOLFIRINOX 03/04/2024, oxaliplatin  held secondary to  neuropathy Cycle 20 FOLFIRINOX 03/18/2024, 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 CTs at [facility] 03/27/2024: Stable hepatic metastases,  no new hepatic lesion, pancreas tail lesion not well-visualized unchanged right lower lobe groundglass opacity Cycle 21 FOLFIRINOX 04/01/2024, 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 Cycle 22 FOLFIRINOX 04/15/2024, 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 Cycle 23 FOLFIRINOX 04/29/2024, 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 Cycle 24 FOLFIRINOX 05/20/2024, 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  Resolved with Reglan .  Recurrent hiccups following cycle 4 FOLFIRINOX-not relieved with Reglan     Disposition: David [name] appears stable.  He has completed 23 cycles of systemic therapy.  Oxaliplatin  remains on hold due to neuropathy.  He is tolerating treatment well.  There is no clinical evidence of disease progression.  Most recent CA 19-9 further improved.  Plan to proceed with cycle 24 FOLFIRI today as scheduled.  CBC and chemistry panel reviewed.  Labs adequate for treatment.  He will return for follow-up and treatment in 3 weeks rather than 2 due to a planned vacation.    Diana Forster ANP/GNP-BC   05/20/2024  8:42 AM

## 2024-05-20 NOTE — Patient Instructions (Signed)
 [facility] - A DEPT OF [facility]   Discharge Instructions: The chemotherapy medication bag should finish at 46 hours, 96 hours, or 7 days. For example, if your pump is scheduled for 46 hours and it was put on at 4:00 p.m., it should finish at 2:00 p.m. the day it is scheduled to come off regardless of your appointment time.     Estimated time to finish at 10:00 FRIDAY, May 22, 2024.   If the display on your pump reads "Low Volume" and it is beeping, take the batteries out of the pump and come to the cancer center for it to be taken off.   If the pump alarms go off prior to the pump reading "Low Volume" then call [phone] and someone can assist you.  If the plunger comes out and the chemotherapy medication is leaking out, please use your home chemo spill kit to clean up the spill. Do NOT use paper towels or other household products.  If you have problems or questions regarding your pump, please call either [phone] (24 hours a day) or the cancer center Monday-Friday 8:00 a.m.- 4:30 p.m. at the clinic number and we will assist you. If you are unable to get assistance, then go to the nearest Emergency Department and ask the staff to contact the IV team for assistance.   Thank you for choosing [facility] to provide your oncology and hematology care.   If you have a lab appointment with the Cancer Center, please go directly to the Cancer Center and check in at the registration area.   Wear comfortable clothing and clothing appropriate for easy access to any Portacath or PICC line.   We strive to give you quality time with your provider. You may need to reschedule your appointment if you arrive late (15 or more minutes).  Arriving late affects you and other patients whose appointments are after yours.  Also, if you miss three or more appointments without notifying the office, you may be dismissed from the clinic at the provider's  discretion.      For prescription refill requests, have your pharmacy contact our office and allow 72 hours for refills to be completed.    Today you received the following chemotherapy and/or immunotherapy agents IRINOTECAN /LEUCOVORIN /FLUOROURACIL       To help prevent nausea and vomiting after your treatment, we encourage you to take your nausea medication as directed.  BELOW ARE SYMPTOMS THAT SHOULD BE REPORTED IMMEDIATELY: *FEVER GREATER THAN 100.4 F (38 C) OR HIGHER *CHILLS OR SWEATING *NAUSEA AND VOMITING THAT IS NOT CONTROLLED WITH YOUR NAUSEA MEDICATION *UNUSUAL SHORTNESS OF BREATH *UNUSUAL BRUISING OR BLEEDING *URINARY PROBLEMS (pain or burning when urinating, or frequent urination) *BOWEL PROBLEMS (unusual diarrhea, constipation, pain near the anus) TENDERNESS IN MOUTH AND THROAT WITH OR WITHOUT PRESENCE OF ULCERS (sore throat, sores in mouth, or a toothache) UNUSUAL RASH, SWELLING OR PAIN  UNUSUAL VAGINAL DISCHARGE OR ITCHING   Items with * indicate a potential emergency and should be followed up as soon as possible or go to the Emergency Department if any problems should occur.  Please show the CHEMOTHERAPY ALERT CARD or IMMUNOTHERAPY ALERT CARD at check-in to the Emergency Department and triage nurse.  Should you have questions after your visit or need to cancel or reschedule your appointment, please contact [facility] - A DEPT OF [facility]  Dept: [phone]  and follow the prompts.  Office hours  are 8:00 a.m. to 4:30 p.m. Monday - Friday. Please note that voicemails left after 4:00 p.m. may not be returned until the following business day.  We are closed weekends and major holidays. You have access to a nurse at all times for urgent questions. Please call the main number to the clinic Dept: [phone] and follow the prompts.   For any non-urgent questions, you may also contact your provider using MyChart. We now offer e-Visits for  anyone 7 and older to request care online for non-urgent symptoms. For details visit [email or web address].   Also download the MyChart app! Go to the app store, search "MyChart", open the app, select [facility], and log in with your MyChart username and password.  Irinotecan  Injection What is this medication? IRINOTECAN  (ir in oh TEE kan) treats some types of cancer. It works by slowing down the growth of cancer cells. This medicine may be used for other purposes; ask your health care provider or pharmacist if you have questions. COMMON BRAND NAME(S): Camptosar  What should I tell my care team before I take this medication? They need to know if you have any of these conditions: Dehydration Diarrhea Infection, especially a viral infection, such as chickenpox, cold sores, herpes Liver disease Low blood cell levels (white cells, red cells, and platelets) Low levels of electrolytes, such as calcium , magnesium, or potassium in your blood Recent or ongoing radiation An unusual or allergic reaction to irinotecan , other medications, foods, dyes, or preservatives If you or your partner are pregnant or trying to get pregnant Breast-feeding How should I use this medication? This medication is injected into a vein. It is given by your care team in a hospital or clinic setting. Talk to your care team about the use of this medication in children. Special care may be needed. Overdosage: If you think you have taken too much of this medicine contact a poison control center or emergency room at once. NOTE: This medicine is only for you. Do not share this medicine with others. What if I miss a dose? Keep appointments for follow-up doses. It is important not to miss your dose. Call your care team if you are unable to keep an appointment. What may interact with this medication? Do not take this medication with any of the following: Cobicistat Itraconazole This medication may also interact with the  following: Certain antibiotics, such as clarithromycin, rifampin, rifabutin Certain antivirals for HIV or AIDS Certain medications for fungal infections, such as ketoconazole, posaconazole, voriconazole Certain medications for seizures, such as carbamazepine, phenobarbital, phenytoin Gemfibrozil Nefazodone St. John's wort This list may not describe all possible interactions. Give your health care provider a list of all the medicines, herbs, non-prescription drugs, or dietary supplements you use. Also tell them if you smoke, drink alcohol, or use illegal drugs. Some items may interact with your medicine. What should I watch for while using this medication? Your condition will be monitored carefully while you are receiving this medication. You may need blood work while taking this medication. This medication may make you feel generally unwell. This is not uncommon as chemotherapy can affect healthy cells as well as cancer cells. Report any side effects. Continue your course of treatment even though you feel ill unless your care team tells you to stop. This medication can cause serious side effects. To reduce the risk, your care team may give you other medications to take before receiving this one. Be sure to follow the directions from your care team. This medication may  affect your coordination, reaction time, or judgement. Do not drive or operate machinery until you know how this medication affects you. Sit up or stand slowly to reduce the risk of dizzy or fainting spells. Drinking alcohol with this medication can increase the risk of these side effects. This medication may increase your risk of getting an infection. Call your care team for advice if you get a fever, chills, sore throat, or other symptoms of a cold or flu. Do not treat yourself. Try to avoid being around people who are sick. Avoid taking medications that contain aspirin, acetaminophen , ibuprofen, naproxen, or ketoprofen unless  instructed by your care team. These medications may hide a fever. This medication may increase your risk to bruise or bleed. Call your care team if you notice any unusual bleeding. Be careful brushing or flossing your teeth or using a toothpick because you may get an infection or bleed more easily. If you have any dental work done, tell your dentist you are receiving this medication. Talk to your care team if you or your partner are pregnant or think either of you might be pregnant. This medication can cause serious birth defects if taken during pregnancy and for 6 months after the last dose. You will need a negative pregnancy test before starting this medication. Contraception is recommended while taking this medication and for 6 months after the last dose. Your care team can help you find the option that works for you. Do not father a child while taking this medication and for 3 months after the last dose. Use a condom for contraception during this time period. Do not breastfeed while taking this medication and for 7 days after the last dose. This medication may cause infertility. Talk to your care team if you are concerned about your fertility. What side effects may I notice from receiving this medication? Side effects that you should report to your care team as soon as possible: Allergic reactions--skin rash, itching, hives, swelling of the face, lips, tongue, or throat Dry cough, shortness of breath or trouble breathing Increased saliva or tears, increased sweating, stomach cramping, diarrhea, small pupils, unusual weakness or fatigue, slow heartbeat Infection--fever, chills, cough, sore throat, wounds that don't heal, pain or trouble when passing urine, general feeling of discomfort or being unwell Kidney injury--decrease in the amount of urine, swelling of the ankles, hands, or feet Low red blood cell level--unusual weakness or fatigue, dizziness, headache, trouble breathing Severe or prolonged  diarrhea Unusual bruising or bleeding Side effects that usually do not require medical attention (report to your care team if they continue or are bothersome): Constipation Diarrhea Hair loss Loss of appetite Nausea Stomach pain This list may not describe all possible side effects. Call your doctor for medical advice about side effects. You may report side effects to FDA at 1-800-FDA-1088. Where should I keep my medication? This medication is given in a hospital or clinic. It will not be stored at home. NOTE: This sheet is a summary. It may not cover all possible information. If you have questions about this medicine, talk to your doctor, pharmacist, or health care provider.  2024 Elsevier/Gold Standard (2022-04-16 00:00:00)  Leucovorin  Injection What is this medication? LEUCOVORIN  (loo koe VOR in) prevents side effects from certain medications, such as methotrexate. It works by increasing folate levels. This helps protect healthy cells in your body. It may also be used to treat anemia caused by low levels of folate. It can also be used with fluorouracil , a type  of chemotherapy, to treat colorectal cancer. It works by increasing the effects of fluorouracil  in the body. This medicine may be used for other purposes; ask your health care provider or pharmacist if you have questions. What should I tell my care team before I take this medication? They need to know if you have any of these conditions: Anemia from low levels of vitamin B12 in the blood An unusual or allergic reaction to leucovorin , folic acid, other medications, foods, dyes, or preservatives Pregnant or trying to get pregnant Breastfeeding How should I use this medication? This medication is injected into a vein or a muscle. It is given by your care team in a hospital or clinic setting. Talk to your care team about the use of this medication in children. Special care may be needed. Overdosage: If you think you have taken too  much of this medicine contact a poison control center or emergency room at once. NOTE: This medicine is only for you. Do not share this medicine with others. What if I miss a dose? Keep appointments for follow-up doses. It is important not to miss your dose. Call your care team if you are unable to keep an appointment. What may interact with this medication? Capecitabine Fluorouracil  Phenobarbital Phenytoin Primidone Trimethoprim;sulfamethoxazole This list may not describe all possible interactions. Give your health care provider a list of all the medicines, herbs, non-prescription drugs, or dietary supplements you use. Also tell them if you smoke, drink alcohol, or use illegal drugs. Some items may interact with your medicine. What should I watch for while using this medication? Your condition will be monitored carefully while you are receiving this medication. This medication may increase the side effects of 5-fluorouracil . Tell your care team if you have diarrhea or mouth sores that do not get better or that get worse. What side effects may I notice from receiving this medication? Side effects that you should report to your care team as soon as possible: Allergic reactions--skin rash, itching, hives, swelling of the face, lips, tongue, or throat This list may not describe all possible side effects. Call your doctor for medical advice about side effects. You may report side effects to FDA at 1-800-FDA-1088. Where should I keep my medication? This medication is given in a hospital or clinic. It will not be stored at home. NOTE: This sheet is a summary. It may not cover all possible information. If you have questions about this medicine, talk to your doctor, pharmacist, or health care provider.  2024 Elsevier/Gold Standard (2022-05-08 00:00:00)  Fluorouracil  Injection What is this medication? FLUOROURACIL  (flure oh YOOR a sil) treats some types of cancer. It works by slowing down the  growth of cancer cells. This medicine may be used for other purposes; ask your health care provider or pharmacist if you have questions. COMMON BRAND NAME(S): Adrucil  What should I tell my care team before I take this medication? They need to know if you have any of these conditions: Blood disorders Dihydropyrimidine dehydrogenase (DPD) deficiency Infection, such as chickenpox, cold sores, herpes Kidney disease Liver disease Poor nutrition Recent or ongoing radiation therapy An unusual or allergic reaction to fluorouracil , other medications, foods, dyes, or preservatives If you or your partner are pregnant or trying to get pregnant Breast-feeding How should I use this medication? This medication is injected into a vein. It is administered by your care team in a hospital or clinic setting. Talk to your care team about the use of this medication in children. Special  care may be needed. Overdosage: If you think you have taken too much of this medicine contact a poison control center or emergency room at once. NOTE: This medicine is only for you. Do not share this medicine with others. What if I miss a dose? Keep appointments for follow-up doses. It is important not to miss your dose. Call your care team if you are unable to keep an appointment. What may interact with this medication? Do not take this medication with any of the following: Live virus vaccines This medication may also interact with the following: Medications that treat or prevent blood clots, such as warfarin, enoxaparin, dalteparin This list may not describe all possible interactions. Give your health care provider a list of all the medicines, herbs, non-prescription drugs, or dietary supplements you use. Also tell them if you smoke, drink alcohol, or use illegal drugs. Some items may interact with your medicine. What should I watch for while using this medication? Your condition will be monitored carefully while you are  receiving this medication. This medication may make you feel generally unwell. This is not uncommon as chemotherapy can affect healthy cells as well as cancer cells. Report any side effects. Continue your course of treatment even though you feel ill unless your care team tells you to stop. In some cases, you may be given additional medications to help with side effects. Follow all directions for their use. This medication may increase your risk of getting an infection. Call your care team for advice if you get a fever, chills, sore throat, or other symptoms of a cold or flu. Do not treat yourself. Try to avoid being around people who are sick. This medication may increase your risk to bruise or bleed. Call your care team if you notice any unusual bleeding. Be careful brushing or flossing your teeth or using a toothpick because you may get an infection or bleed more easily. If you have any dental work done, tell your dentist you are receiving this medication. Avoid taking medications that contain aspirin, acetaminophen , ibuprofen, naproxen, or ketoprofen unless instructed by your care team. These medications may hide a fever. Do not treat diarrhea with over the counter products. Contact your care team if you have diarrhea that lasts more than 2 days or if it is severe and watery. This medication can make you more sensitive to the sun. Keep out of the sun. If you cannot avoid being in the sun, wear protective clothing and sunscreen. Do not use sun lamps, tanning beds, or tanning booths. Talk to your care team if you or your partner wish to become pregnant or think you might be pregnant. This medication can cause serious birth defects if taken during pregnancy and for 3 months after the last dose. A reliable form of contraception is recommended while taking this medication and for 3 months after the last dose. Talk to your care team about effective forms of contraception. Do not father a child while taking  this medication and for 3 months after the last dose. Use a condom while having sex during this time period. Do not breastfeed while taking this medication. This medication may cause infertility. Talk to your care team if you are concerned about your fertility. What side effects may I notice from receiving this medication? Side effects that you should report to your care team as soon as possible: Allergic reactions--skin rash, itching, hives, swelling of the face, lips, tongue, or throat Heart attack--pain or tightness in the chest, shoulders,  arms, or jaw, nausea, shortness of breath, cold or clammy skin, feeling faint or lightheaded Heart failure--shortness of breath, swelling of the ankles, feet, or hands, sudden weight gain, unusual weakness or fatigue Heart rhythm changes--fast or irregular heartbeat, dizziness, feeling faint or lightheaded, chest pain, trouble breathing High ammonia level--unusual weakness or fatigue, confusion, loss of appetite, nausea, vomiting, seizures Infection--fever, chills, cough, sore throat, wounds that don't heal, pain or trouble when passing urine, general feeling of discomfort or being unwell Low red blood cell level--unusual weakness or fatigue, dizziness, headache, trouble breathing Pain, tingling, or numbness in the hands or feet, muscle weakness, change in vision, confusion or trouble speaking, loss of balance or coordination, trouble walking, seizures Redness, swelling, and blistering of the skin over hands and feet Severe or prolonged diarrhea Unusual bruising or bleeding Side effects that usually do not require medical attention (report to your care team if they continue or are bothersome): Dry skin Headache Increased tears Nausea Pain, redness, or swelling with sores inside the mouth or throat Sensitivity to light Vomiting This list may not describe all possible side effects. Call your doctor for medical advice about side effects. You may report  side effects to FDA at 1-800-FDA-1088. Where should I keep my medication? This medication is given in a hospital or clinic. It will not be stored at home. NOTE: This sheet is a summary. It may not cover all possible information. If you have questions about this medicine, talk to your doctor, pharmacist, or health care provider.  2024 Elsevier/Gold Standard (2022-04-10 00:00:00)

## 2024-05-20 NOTE — Progress Notes (Signed)
 Patient seen by Lonna Cobb NP today  Vitals are within treatment parameters:Yes   Labs are within treatment parameters: Yes   Treatment plan has been signed: Yes   Per physician team, Patient is ready for treatment and there are NO modifications to the treatment plan.

## 2024-05-21 ENCOUNTER — Other Ambulatory Visit: Payer: Self-pay

## 2024-05-21 ENCOUNTER — Telehealth: Payer: Self-pay

## 2024-05-21 LAB — CANCER ANTIGEN 19-9: CA 19-9: 52 U/mL — ABNORMAL HIGH (ref 0–35)

## 2024-05-21 NOTE — Telephone Encounter (Signed)
 I contacted the patient to check on his condition, and he reports that he is doing well. He has requested to reschedule his appointment to one week earlier. I have notified the scheduler and requested to reschedule the appointment for the 18th.

## 2024-05-22 ENCOUNTER — Encounter: Payer: Self-pay | Admitting: *Deleted

## 2024-05-22 ENCOUNTER — Inpatient Hospital Stay

## 2024-05-22 VITALS — BP 121/75 | HR 76 | Temp 98.2°F | Resp 17

## 2024-05-22 DIAGNOSIS — Z5111 Encounter for antineoplastic chemotherapy: Secondary | ICD-10-CM | POA: Diagnosis not present

## 2024-05-22 DIAGNOSIS — C259 Malignant neoplasm of pancreas, unspecified: Secondary | ICD-10-CM

## 2024-05-22 MED ORDER — SODIUM CHLORIDE 0.9% FLUSH
10.0000 mL | INTRAVENOUS | Status: DC | PRN
  Administered 2024-05-22: 10 mL

## 2024-05-22 MED ORDER — HEPARIN SOD (PORK) LOCK FLUSH 100 UNIT/ML IV SOLN
500.0000 [IU] | Freq: Once | INTRAVENOUS | Status: AC | PRN
  Administered 2024-05-22: 500 [IU]

## 2024-05-22 MED ORDER — PEGFILGRASTIM-JMDB 6 MG/0.6ML ~~LOC~~ SOSY
6.0000 mg | PREFILLED_SYRINGE | Freq: Once | SUBCUTANEOUS | Status: AC
  Administered 2024-05-22: 6 mg via SUBCUTANEOUS
  Filled 2024-05-22: qty 0.6

## 2024-05-22 NOTE — Patient Instructions (Signed)

## 2024-05-22 NOTE — Progress Notes (Unsigned)
 [name] called scheduler to request that he return to every 2 week chemotherapy treatments. Wishes to begin on 6/19. 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

## 2024-05-25 ENCOUNTER — Other Ambulatory Visit

## 2024-05-25 ENCOUNTER — Ambulatory Visit: Admitting: Oncology

## 2024-05-25 ENCOUNTER — Ambulatory Visit

## 2024-05-27 ENCOUNTER — Encounter

## 2024-05-30 ENCOUNTER — Other Ambulatory Visit: Payer: Self-pay | Admitting: Oncology

## 2024-06-03 ENCOUNTER — Other Ambulatory Visit: Payer: Self-pay

## 2024-06-03 ENCOUNTER — Ambulatory Visit: Payer: Self-pay | Admitting: Nurse Practitioner

## 2024-06-03 ENCOUNTER — Ambulatory Visit: Payer: Self-pay

## 2024-06-03 ENCOUNTER — Telehealth: Payer: Self-pay

## 2024-06-03 DIAGNOSIS — C259 Malignant neoplasm of pancreas, unspecified: Secondary | ICD-10-CM

## 2024-06-03 NOTE — Telephone Encounter (Signed)
 The patient contacted us  to cancel his appointments. He explained that he called the after-hours line to notify us  that he would be unable to attend. The patient mentioned his flight was delayed, and he is currently tired and lacking the energy to come in. He stated he will contact us  later to reschedule his next appointment.

## 2024-06-04 ENCOUNTER — Other Ambulatory Visit: Payer: Self-pay

## 2024-06-04 ENCOUNTER — Encounter: Payer: Self-pay | Admitting: Oncology

## 2024-06-10 ENCOUNTER — Encounter: Payer: Self-pay | Admitting: Nurse Practitioner

## 2024-06-10 ENCOUNTER — Inpatient Hospital Stay (HOSPITAL_BASED_OUTPATIENT_CLINIC_OR_DEPARTMENT_OTHER): Admitting: Nurse Practitioner

## 2024-06-10 ENCOUNTER — Inpatient Hospital Stay

## 2024-06-10 ENCOUNTER — Other Ambulatory Visit

## 2024-06-10 ENCOUNTER — Ambulatory Visit

## 2024-06-10 ENCOUNTER — Ambulatory Visit: Admitting: Oncology

## 2024-06-10 VITALS — BP 124/80 | HR 75 | Temp 98.3°F | Ht 68.0 in | Wt 161.1 lb

## 2024-06-10 DIAGNOSIS — C259 Malignant neoplasm of pancreas, unspecified: Secondary | ICD-10-CM

## 2024-06-10 DIAGNOSIS — C787 Secondary malignant neoplasm of liver and intrahepatic bile duct: Secondary | ICD-10-CM

## 2024-06-10 DIAGNOSIS — Z5111 Encounter for antineoplastic chemotherapy: Secondary | ICD-10-CM | POA: Diagnosis not present

## 2024-06-10 LAB — CBC WITH DIFFERENTIAL (CANCER CENTER ONLY)
Abs Immature Granulocytes: 0.06 10*3/uL (ref 0.00–0.07)
Basophils Absolute: 0 10*3/uL (ref 0.0–0.1)
Basophils Relative: 1 %
Eosinophils Absolute: 0.1 10*3/uL (ref 0.0–0.5)
Eosinophils Relative: 2 %
HCT: 33.5 % — ABNORMAL LOW (ref 39.0–52.0)
Hemoglobin: 11.3 g/dL — ABNORMAL LOW (ref 13.0–17.0)
Immature Granulocytes: 1 %
Lymphocytes Relative: 15 %
Lymphs Abs: 0.9 10*3/uL (ref 0.7–4.0)
MCH: 34.1 pg — ABNORMAL HIGH (ref 26.0–34.0)
MCHC: 33.7 g/dL (ref 30.0–36.0)
MCV: 101.2 fL — ABNORMAL HIGH (ref 80.0–100.0)
Monocytes Absolute: 0.6 10*3/uL (ref 0.1–1.0)
Monocytes Relative: 11 %
Neutro Abs: 4.2 10*3/uL (ref 1.7–7.7)
Neutrophils Relative %: 70 %
Platelet Count: 167 10*3/uL (ref 150–400)
RBC: 3.31 MIL/uL — ABNORMAL LOW (ref 4.22–5.81)
RDW: 12.6 % (ref 11.5–15.5)
WBC Count: 5.9 10*3/uL (ref 4.0–10.5)
nRBC: 0 % (ref 0.0–0.2)

## 2024-06-10 LAB — CMP (CANCER CENTER ONLY)
ALT: 32 U/L (ref 0–44)
AST: 30 U/L (ref 15–41)
Albumin: 4.3 g/dL (ref 3.5–5.0)
Alkaline Phosphatase: 190 U/L — ABNORMAL HIGH (ref 38–126)
Anion gap: 10 (ref 5–15)
BUN: 15 mg/dL (ref 6–20)
CO2: 25 mmol/L (ref 22–32)
Calcium: 9.7 mg/dL (ref 8.9–10.3)
Chloride: 104 mmol/L (ref 98–111)
Creatinine: 0.75 mg/dL (ref 0.61–1.24)
GFR, Estimated: >60 mL/min (ref >60)
Glucose, Bld: 132 mg/dL — ABNORMAL HIGH (ref 70–99)
Potassium: 4.2 mmol/L (ref 3.5–5.1)
Sodium: 139 mmol/L (ref 135–145)
Total Bilirubin: 0.4 mg/dL (ref 0.0–1.2)
Total Protein: 7.1 g/dL (ref 6.5–8.1)

## 2024-06-10 NOTE — Progress Notes (Signed)
 Patient seen by Lonna Cobb NP today  Vitals are within treatment parameters:Yes   Labs are within treatment parameters: Yes   Treatment plan has been signed: Yes   Per physician team, Patient is ready for treatment and there are NO modifications to the treatment plan.

## 2024-06-10 NOTE — Progress Notes (Signed)
 [facility] OFFICE PROGRESS NOTE   Diagnosis: Pancreas cancer  INTERVAL HISTORY:   Mr. Salguero returns as scheduled.  He completed another cycle of FOLFIRI 05/20/2024.  He denies nausea/vomiting.  No mouth sores.  No diarrhea.  No hand or foot pain or redness.  He did not have hiccups.  Numbness in the fingertips is better, stable in the feet.  Objective:  Vital signs in last 24 hours:  Blood pressure 124/80, pulse 75, temperature 98.3 F (36.8 C), temperature source Temporal, height 5' 8 (1.727 m), weight 161 lb 1.6 oz (73.1 kg), SpO2 100%.    HEENT: No thrush or ulcers. Resp: Lungs clear bilaterally. Cardio: Regular rate and rhythm. GI: No hepatosplenomegaly. Vascular: No leg edema. Skin: Palms without erythema. Port-A-Cath without erythema.  Lab Results:  Lab Results  Component Value Date   WBC 5.9 06/10/2024   HGB 11.3 (L) 06/10/2024   HCT 33.5 (L) 06/10/2024   MCV 101.2 (H) 06/10/2024   PLT 167 06/10/2024   NEUTROABS 4.2 06/10/2024    Imaging:  No results found.  Medications: I have reviewed the patient's current medications.  Assessment/Plan: Pancreas cancer CT angiogram chest 04/24/2023-low-density mass in the dome of the liver with at least 3 additional lesions in both hepatic lobes, borderline lymphadenopathy in the hepatoduodenal ligament Right upper quadrant ultrasound 04/24/2023-large circumscribed mass of the liver dome CT abdomen/pelvis 04/24/2023-multiple liver lesions poorly defined on noncontrast exam, prominent portacaval and porta hepatis nodes MRI abdomen 05/05/2023-multiple rim-hypoenhancing liver lesions consistent with hepatic metastases, suspicion for a pancreas tail mass Ultrasound-guided biopsy of dominant right liver lesion 05/10/2023-adenocarcinoma, CK7 and CDX2 positive, abundant cytoplasm and mucin with extracellular mucin, foci of lymphovascular invasion Tempus gene panel-K-ras G12V, T P53 mild tumor mutation burden 3.2, MSS PET  06/04/2023-multiple hypermetabolic liver lesions consistent with metastases, low-level FDG uptake in the soft tissue fullness at the tip of the pancreas tail, low-level FDG activity involving a small soft tissue nodule between the gastric fundus and spleen, tiny foci of accumulation at the right costovertebral junction at T10 and roof of the left acetabulum without an underlying CT lesion Elevated CEA and CA 19-9 EUS 06/07/2023 ,22x 15 mm irregular mass in the tail the pancreas, 11 mm subcarinal lymph node, FNA biopsy of the pancreas mass-suspicious for malignancy Cycle 1 FOLFIRINOX 06/19/2023 Cycle 2 FOLFIRINOX 07/03/2023 Cycle 3 FOLFIRINOX 07/17/2023 Cycle 4 FOLFIRINOX 07/31/2023 Cycle 5 FOLFIRINOX 08/14/2023, oxaliplatin  and Decadron  dose reduced Cycle 6 FOLFIRINOX 08/28/2023 Cycle 7 FOLFIRINOX 09/11/2023 CTs at [facility] 09/16/2023, compared to 04/24/2023-unchanged pancreas tail mass, decrease in attenuation of hepatic metastases, several lesions have decreased in size, no new lesions Cycle 8 FOLFIRINOX 09/25/2023 Cycle 9 FOLFIRINOX 10/09/2023 Cycle 10 FOLFIRINOX 10/22/2023 Cycle 11 FOLFIRINOX 11/05/2023 Cycle 12 FOLFIRINOX 11/20/2023 Cycle 13 FOLFIRINOX 12/04/2023 Cycle 14 FOLFIRINOX 12/19/2023 CTs at [facility] 12/23/2023: Stable to slight decrease in hepatic metastases, no new lesions, stable pancreas tail mass MRI liver at [facility] 12/26/2023: Multifocal nonenhancing hepatic metastases, pancreas tail mass not well-visualized Cycle 15 FOLFIRINOX 01/01/2024, 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 Cycle 16 FOLFIRINOX 01/15/2024, 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 Cycle 17 FOLFIRINOX 02/05/2024, 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 Cycle 18 FOLFIRINOX 02/19/2024, 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 Cycle 19 FOLFIRINOX 03/04/2024, 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 Cycle 20 FOLFIRINOX 03/18/2024, 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 CTs at [facility] 03/27/2024: Stable hepatic metastases, no new hepatic lesion,  pancreas tail lesion not well-visualized unchanged right lower lobe groundglass opacity Cycle 21 FOLFIRINOX 04/01/2024, 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  Resolved with Reglan .  Recurrent hiccups following cycle 4 FOLFIRINOX-not relieved with Reglan   Disposition: Mr. Batson appears stable.  He continues FOLFIRI.  He tolerated the most recent cycle well.  No clinical evidence of disease progression.  Plan to proceed with treatment as scheduled 06/11/2024.  CBC and chemistry panel reviewed.  Labs adequate for treatment.  He will return for follow-up in the next cycle of chemotherapy in 2 weeks.  We are available to see him sooner if needed.    Olam Ned ANP/GNP-BC   06/10/2024  2:37 PM

## 2024-06-11 ENCOUNTER — Inpatient Hospital Stay

## 2024-06-11 ENCOUNTER — Other Ambulatory Visit: Payer: Self-pay

## 2024-06-11 VITALS — BP 128/79 | HR 66 | Temp 98.1°F | Resp 18

## 2024-06-11 DIAGNOSIS — Z5111 Encounter for antineoplastic chemotherapy: Secondary | ICD-10-CM | POA: Diagnosis not present

## 2024-06-11 DIAGNOSIS — C259 Malignant neoplasm of pancreas, unspecified: Secondary | ICD-10-CM

## 2024-06-11 LAB — CANCER ANTIGEN 19-9: CA 19-9: 48 U/mL — ABNORMAL HIGH (ref 0–35)

## 2024-06-11 MED ORDER — SODIUM CHLORIDE 0.9 % IV SOLN
400.0000 mg/m2 | Freq: Once | INTRAVENOUS | Status: DC
  Filled 2024-06-11: qty 37.2

## 2024-06-11 MED ORDER — SODIUM CHLORIDE 0.9 % IV SOLN
150.0000 mg/m2 | Freq: Once | INTRAVENOUS | Status: AC
  Administered 2024-06-11: 300 mg via INTRAVENOUS
  Filled 2024-06-11: qty 15

## 2024-06-11 MED ORDER — DEXAMETHASONE SODIUM PHOSPHATE 10 MG/ML IJ SOLN
5.0000 mg | Freq: Once | INTRAMUSCULAR | Status: AC
  Administered 2024-06-11: 5 mg via INTRAVENOUS
  Filled 2024-06-11: qty 1

## 2024-06-11 MED ORDER — SODIUM CHLORIDE 0.9 % IV SOLN: INTRAVENOUS | Status: DC

## 2024-06-11 MED ORDER — ATROPINE SULFATE 1 MG/ML IV SOLN
0.5000 mg | Freq: Once | INTRAVENOUS | Status: AC | PRN
  Administered 2024-06-11: 0.5 mg via INTRAVENOUS
  Filled 2024-06-11: qty 1

## 2024-06-11 MED ORDER — SODIUM CHLORIDE 0.9 % IV SOLN
2400.0000 mg/m2 | INTRAVENOUS | Status: DC
  Administered 2024-06-11: 4450 mg via INTRAVENOUS
  Filled 2024-06-11: qty 89

## 2024-06-11 MED ORDER — SODIUM CHLORIDE 0.9 % IV SOLN
150.0000 mg | Freq: Once | INTRAVENOUS | Status: AC
  Administered 2024-06-11: 150 mg via INTRAVENOUS
  Filled 2024-06-11: qty 150

## 2024-06-11 MED ORDER — PALONOSETRON HCL INJECTION 0.25 MG/5ML
0.2500 mg | Freq: Once | INTRAVENOUS | Status: AC
  Administered 2024-06-11: 0.25 mg via INTRAVENOUS
  Filled 2024-06-11: qty 5

## 2024-06-11 MED ORDER — SODIUM CHLORIDE 0.9 % IV SOLN
400.0000 mg/m2 | Freq: Once | INTRAVENOUS | Status: AC
  Administered 2024-06-11: 744 mg via INTRAVENOUS
  Filled 2024-06-11: qty 37.2

## 2024-06-11 NOTE — Patient Instructions (Signed)
 [facility] - A DEPT OF [facility]   Discharge Instructions: Thank you for choosing [facility] to provide your oncology and hematology care.   If you have a lab appointment with the Cancer Center, please go directly to the Cancer Center and check in at the registration area.   Wear comfortable clothing and clothing appropriate for easy access to any Portacath or PICC line.   We strive to give you quality time with your provider. You may need to reschedule your appointment if you arrive late (15 or more minutes).  Arriving late affects you and other patients whose appointments are after yours.  Also, if you miss three or more appointments without notifying the office, you may be dismissed from the clinic at the provider's discretion.      For prescription refill requests, have your pharmacy contact our office and allow 72 hours for refills to be completed.    Today you received the following chemotherapy and/or immunotherapy agents Irinotecan  (CAMPTOSAR ), Leucovorin  & Flourouracil (ADRUCIL ).      To help prevent nausea and vomiting after your treatment, we encourage you to take your nausea medication as directed.  BELOW ARE SYMPTOMS THAT SHOULD BE REPORTED IMMEDIATELY: *FEVER GREATER THAN 100.4 F (38 C) OR HIGHER *CHILLS OR SWEATING *NAUSEA AND VOMITING THAT IS NOT CONTROLLED WITH YOUR NAUSEA MEDICATION *UNUSUAL SHORTNESS OF BREATH *UNUSUAL BRUISING OR BLEEDING *URINARY PROBLEMS (pain or burning when urinating, or frequent urination) *BOWEL PROBLEMS (unusual diarrhea, constipation, pain near the anus) TENDERNESS IN MOUTH AND THROAT WITH OR WITHOUT PRESENCE OF ULCERS (sore throat, sores in mouth, or a toothache) UNUSUAL RASH, SWELLING OR PAIN  UNUSUAL VAGINAL DISCHARGE OR ITCHING   Items with * indicate a potential emergency and should be followed up as soon as possible or go to the Emergency Department if any problems should  occur.  Please show the CHEMOTHERAPY ALERT CARD or IMMUNOTHERAPY ALERT CARD at check-in to the Emergency Department and triage nurse.  Should you have questions after your visit or need to cancel or reschedule your appointment, please contact [facility] - A DEPT OF [facility]  Dept: [phone]  and follow the prompts.  Office hours are 8:00 a.m. to 4:30 p.m. Monday - Friday. Please note that voicemails left after 4:00 p.m. may not be returned until the following business day.  We are closed weekends and major holidays. You have access to a nurse at all times for urgent questions. Please call the main number to the clinic Dept: [phone] and follow the prompts.   For any non-urgent questions, you may also contact your provider using MyChart. We now offer e-Visits for anyone 63 and older to request care online for non-urgent symptoms. For details visit [email or web address].   Also download the MyChart app! Go to the app store, search MyChart, open the app, select [place], and log in with your MyChart username and password.  Irinotecan  Injection What is this medication? IRINOTECAN  (ir in oh TEE kan) treats some types of cancer. It works by slowing down the growth of cancer cells. This medicine may be used for other purposes; ask your health care provider or pharmacist if you have questions. COMMON BRAND NAME(S): Camptosar  What should I tell my care team before I take this medication? They need to know if you have any of these conditions: Dehydration Diarrhea Infection, especially a viral infection, such as chickenpox, cold sores, herpes Liver disease Low blood  cell levels (white cells, red cells, and platelets) Low levels of electrolytes, such as calcium , magnesium, or potassium in your blood Recent or ongoing radiation An unusual or allergic reaction to irinotecan , other medications, foods, dyes, or preservatives If you or your partner are  pregnant or trying to get pregnant Breast-feeding How should I use this medication? This medication is injected into a vein. It is given by your care team in a hospital or clinic setting. Talk to your care team about the use of this medication in children. Special care may be needed. Overdosage: If you think you have taken too much of this medicine contact a poison control center or emergency room at once. NOTE: This medicine is only for you. Do not share this medicine with others. What if I miss a dose? Keep appointments for follow-up doses. It is important not to miss your dose. Call your care team if you are unable to keep an appointment. What may interact with this medication? Do not take this medication with any of the following: Cobicistat Itraconazole This medication may also interact with the following: Certain antibiotics, such as clarithromycin, rifampin, rifabutin Certain antivirals for HIV or AIDS Certain medications for fungal infections, such as ketoconazole, posaconazole, voriconazole Certain medications for seizures, such as carbamazepine, phenobarbital, phenytoin Gemfibrozil Nefazodone St. John's wort This list may not describe all possible interactions. Give your health care provider a list of all the medicines, herbs, non-prescription drugs, or dietary supplements you use. Also tell them if you smoke, drink alcohol, or use illegal drugs. Some items may interact with your medicine. What should I watch for while using this medication? Your condition will be monitored carefully while you are receiving this medication. You may need blood work while taking this medication. This medication may make you feel generally unwell. This is not uncommon as chemotherapy can affect healthy cells as well as cancer cells. Report any side effects. Continue your course of treatment even though you feel ill unless your care team tells you to stop. This medication can cause serious side  effects. To reduce the risk, your care team may give you other medications to take before receiving this one. Be sure to follow the directions from your care team. This medication may affect your coordination, reaction time, or judgement. Do not drive or operate machinery until you know how this medication affects you. Sit up or stand slowly to reduce the risk of dizzy or fainting spells. Drinking alcohol with this medication can increase the risk of these side effects. This medication may increase your risk of getting an infection. Call your care team for advice if you get a fever, chills, sore throat, or other symptoms of a cold or flu. Do not treat yourself. Try to avoid being around people who are sick. Avoid taking medications that contain aspirin, acetaminophen , ibuprofen, naproxen, or ketoprofen unless instructed by your care team. These medications may hide a fever. This medication may increase your risk to bruise or bleed. Call your care team if you notice any unusual bleeding. Be careful brushing or flossing your teeth or using a toothpick because you may get an infection or bleed more easily. If you have any dental work done, tell your dentist you are receiving this medication. Talk to your care team if you or your partner are pregnant or think either of you might be pregnant. This medication can cause serious birth defects if taken during pregnancy and for 6 months after the last dose. You will need  a negative pregnancy test before starting this medication. Contraception is recommended while taking this medication and for 6 months after the last dose. Your care team can help you find the option that works for you. Do not father a child while taking this medication and for 3 months after the last dose. Use a condom for contraception during this time period. Do not breastfeed while taking this medication and for 7 days after the last dose. This medication may cause infertility. Talk to your care  team if you are concerned about your fertility. What side effects may I notice from receiving this medication? Side effects that you should report to your care team as soon as possible: Allergic reactions--skin rash, itching, hives, swelling of the face, lips, tongue, or throat Dry cough, shortness of breath or trouble breathing Increased saliva or tears, increased sweating, stomach cramping, diarrhea, small pupils, unusual weakness or fatigue, slow heartbeat Infection--fever, chills, cough, sore throat, wounds that don't heal, pain or trouble when passing urine, general feeling of discomfort or being unwell Kidney injury--decrease in the amount of urine, swelling of the ankles, hands, or feet Low red blood cell level--unusual weakness or fatigue, dizziness, headache, trouble breathing Severe or prolonged diarrhea Unusual bruising or bleeding Side effects that usually do not require medical attention (report to your care team if they continue or are bothersome): Constipation Diarrhea Hair loss Loss of appetite Nausea Stomach pain This list may not describe all possible side effects. Call your doctor for medical advice about side effects. You may report side effects to FDA at 1-800-FDA-1088. Where should I keep my medication? This medication is given in a hospital or clinic. It will not be stored at home. NOTE: This sheet is a summary. It may not cover all possible information. If you have questions about this medicine, talk to your doctor, pharmacist, or health care provider.  2024 Elsevier/Gold Standard (2022-04-16 00:00:00)  Leucovorin  Injection What is this medication? LEUCOVORIN  (loo koe VOR in) prevents side effects from certain medications, such as methotrexate. It works by increasing folate levels. This helps protect healthy cells in your body. It may also be used to treat anemia caused by low levels of folate. It can also be used with fluorouracil , a type of chemotherapy, to treat  colorectal cancer. It works by increasing the effects of fluorouracil  in the body. This medicine may be used for other purposes; ask your health care provider or pharmacist if you have questions. What should I tell my care team before I take this medication? They need to know if you have any of these conditions: Anemia from low levels of vitamin B12 in the blood An unusual or allergic reaction to leucovorin , folic acid, other medications, foods, dyes, or preservatives Pregnant or trying to get pregnant Breastfeeding How should I use this medication? This medication is injected into a vein or a muscle. It is given by your care team in a hospital or clinic setting. Talk to your care team about the use of this medication in children. Special care may be needed. Overdosage: If you think you have taken too much of this medicine contact a poison control center or emergency room at once. NOTE: This medicine is only for you. Do not share this medicine with others. What if I miss a dose? Keep appointments for follow-up doses. It is important not to miss your dose. Call your care team if you are unable to keep an appointment. What may interact with this medication? Capecitabine Fluorouracil  Phenobarbital  Phenytoin Primidone Trimethoprim;sulfamethoxazole This list may not describe all possible interactions. Give your health care provider a list of all the medicines, herbs, non-prescription drugs, or dietary supplements you use. Also tell them if you smoke, drink alcohol, or use illegal drugs. Some items may interact with your medicine. What should I watch for while using this medication? Your condition will be monitored carefully while you are receiving this medication. This medication may increase the side effects of 5-fluorouracil . Tell your care team if you have diarrhea or mouth sores that do not get better or that get worse. What side effects may I notice from receiving this medication? Side  effects that you should report to your care team as soon as possible: Allergic reactions--skin rash, itching, hives, swelling of the face, lips, tongue, or throat This list may not describe all possible side effects. Call your doctor for medical advice about side effects. You may report side effects to FDA at 1-800-FDA-1088. Where should I keep my medication? This medication is given in a hospital or clinic. It will not be stored at home. NOTE: This sheet is a summary. It may not cover all possible information. If you have questions about this medicine, talk to your doctor, pharmacist, or health care provider.  2024 Elsevier/Gold Standard (2022-05-08 00:00:00)  Fluorouracil  Injection What is this medication? FLUOROURACIL  (flure oh YOOR a sil) treats some types of cancer. It works by slowing down the growth of cancer cells. This medicine may be used for other purposes; ask your health care provider or pharmacist if you have questions. COMMON BRAND NAME(S): Adrucil  What should I tell my care team before I take this medication? They need to know if you have any of these conditions: Blood disorders Dihydropyrimidine dehydrogenase (DPD) deficiency Infection, such as chickenpox, cold sores, herpes Kidney disease Liver disease Poor nutrition Recent or ongoing radiation therapy An unusual or allergic reaction to fluorouracil , other medications, foods, dyes, or preservatives If you or your partner are pregnant or trying to get pregnant Breast-feeding How should I use this medication? This medication is injected into a vein. It is administered by your care team in a hospital or clinic setting. Talk to your care team about the use of this medication in children. Special care may be needed. Overdosage: If you think you have taken too much of this medicine contact a poison control center or emergency room at once. NOTE: This medicine is only for you. Do not share this medicine with others. What if  I miss a dose? Keep appointments for follow-up doses. It is important not to miss your dose. Call your care team if you are unable to keep an appointment. What may interact with this medication? Do not take this medication with any of the following: Live virus vaccines This medication may also interact with the following: Medications that treat or prevent blood clots, such as warfarin, enoxaparin, dalteparin This list may not describe all possible interactions. Give your health care provider a list of all the medicines, herbs, non-prescription drugs, or dietary supplements you use. Also tell them if you smoke, drink alcohol, or use illegal drugs. Some items may interact with your medicine. What should I watch for while using this medication? Your condition will be monitored carefully while you are receiving this medication. This medication may make you feel generally unwell. This is not uncommon as chemotherapy can affect healthy cells as well as cancer cells. Report any side effects. Continue your course of treatment even though you feel ill  unless your care team tells you to stop. In some cases, you may be given additional medications to help with side effects. Follow all directions for their use. This medication may increase your risk of getting an infection. Call your care team for advice if you get a fever, chills, sore throat, or other symptoms of a cold or flu. Do not treat yourself. Try to avoid being around people who are sick. This medication may increase your risk to bruise or bleed. Call your care team if you notice any unusual bleeding. Be careful brushing or flossing your teeth or using a toothpick because you may get an infection or bleed more easily. If you have any dental work done, tell your dentist you are receiving this medication. Avoid taking medications that contain aspirin, acetaminophen , ibuprofen, naproxen, or ketoprofen unless instructed by your care team. These medications  may hide a fever. Do not treat diarrhea with over the counter products. Contact your care team if you have diarrhea that lasts more than 2 days or if it is severe and watery. This medication can make you more sensitive to the sun. Keep out of the sun. If you cannot avoid being in the sun, wear protective clothing and sunscreen. Do not use sun lamps, tanning beds, or tanning booths. Talk to your care team if you or your partner wish to become pregnant or think you might be pregnant. This medication can cause serious birth defects if taken during pregnancy and for 3 months after the last dose. A reliable form of contraception is recommended while taking this medication and for 3 months after the last dose. Talk to your care team about effective forms of contraception. Do not father a child while taking this medication and for 3 months after the last dose. Use a condom while having sex during this time period. Do not breastfeed while taking this medication. This medication may cause infertility. Talk to your care team if you are concerned about your fertility. What side effects may I notice from receiving this medication? Side effects that you should report to your care team as soon as possible: Allergic reactions--skin rash, itching, hives, swelling of the face, lips, tongue, or throat Heart attack--pain or tightness in the chest, shoulders, arms, or jaw, nausea, shortness of breath, cold or clammy skin, feeling faint or lightheaded Heart failure--shortness of breath, swelling of the ankles, feet, or hands, sudden weight gain, unusual weakness or fatigue Heart rhythm changes--fast or irregular heartbeat, dizziness, feeling faint or lightheaded, chest pain, trouble breathing High ammonia level--unusual weakness or fatigue, confusion, loss of appetite, nausea, vomiting, seizures Infection--fever, chills, cough, sore throat, wounds that don't heal, pain or trouble when passing urine, general feeling of  discomfort or being unwell Low red blood cell level--unusual weakness or fatigue, dizziness, headache, trouble breathing Pain, tingling, or numbness in the hands or feet, muscle weakness, change in vision, confusion or trouble speaking, loss of balance or coordination, trouble walking, seizures Redness, swelling, and blistering of the skin over hands and feet Severe or prolonged diarrhea Unusual bruising or bleeding Side effects that usually do not require medical attention (report to your care team if they continue or are bothersome): Dry skin Headache Increased tears Nausea Pain, redness, or swelling with sores inside the mouth or throat Sensitivity to light Vomiting This list may not describe all possible side effects. Call your doctor for medical advice about side effects. You may report side effects to FDA at 1-800-FDA-1088. Where should I keep my medication?  This medication is given in a hospital or clinic. It will not be stored at home. NOTE: This sheet is a summary. It may not cover all possible information. If you have questions about this medicine, talk to your doctor, pharmacist, or health care provider.  2024 Elsevier/Gold Standard (2022-04-10 00:00:00)  The chemotherapy medication bag should finish at 46 hours, 96 hours, or 7 days. For example, if your pump is scheduled for 46 hours and it was put on at 4:00 p.m., it should finish at 2:00 p.m. the day it is scheduled to come off regardless of your appointment time.     Estimated time to finish at 2:00 p.m. on Saturday 06/13/2024   If the display on your pump reads Low Volume and it is beeping, take the batteries out of the pump and come to the cancer center for it to be taken off.   If the pump alarms go off prior to the pump reading Low Volume then call [phone] and someone can assist you.  If the plunger comes out and the chemotherapy medication is leaking out, please use your home chemo spill kit to clean up the  spill. Do NOT use paper towels or other household products.  If you have problems or questions regarding your pump, please call either [phone] (24 hours a day) or the cancer center Monday-Friday 8:00 a.m.- 4:30 p.m. at the clinic number and we will assist you. If you are unable to get assistance, then go to the nearest Emergency Department and ask the staff to contact the IV team for assistance.

## 2024-06-12 ENCOUNTER — Encounter

## 2024-06-13 ENCOUNTER — Inpatient Hospital Stay

## 2024-06-13 VITALS — BP 136/87 | HR 85 | Temp 97.7°F | Resp 20

## 2024-06-13 DIAGNOSIS — Z5111 Encounter for antineoplastic chemotherapy: Secondary | ICD-10-CM | POA: Diagnosis not present

## 2024-06-13 DIAGNOSIS — C259 Malignant neoplasm of pancreas, unspecified: Secondary | ICD-10-CM

## 2024-06-13 MED ORDER — HEPARIN SOD (PORK) LOCK FLUSH 100 UNIT/ML IV SOLN
500.0000 [IU] | Freq: Once | INTRAVENOUS | Status: AC | PRN
Start: 2024-06-13 — End: 2024-06-13
  Administered 2024-06-13: 500 [IU]

## 2024-06-13 MED ORDER — SODIUM CHLORIDE 0.9% FLUSH
10.0000 mL | INTRAVENOUS | Status: DC | PRN
  Administered 2024-06-13: 10 mL

## 2024-06-13 MED ORDER — PEGFILGRASTIM-JMDB 6 MG/0.6ML ~~LOC~~ SOSY
6.0000 mg | PREFILLED_SYRINGE | Freq: Once | SUBCUTANEOUS | Status: AC
  Administered 2024-06-13: 6 mg via SUBCUTANEOUS

## 2024-06-16 ENCOUNTER — Other Ambulatory Visit: Payer: Self-pay

## 2024-06-16 ENCOUNTER — Ambulatory Visit: Payer: Self-pay

## 2024-06-16 ENCOUNTER — Ambulatory Visit: Payer: Self-pay | Admitting: Nurse Practitioner

## 2024-06-17 ENCOUNTER — Encounter: Payer: Self-pay | Admitting: Oncology

## 2024-06-19 ENCOUNTER — Encounter: Payer: Self-pay | Admitting: Oncology

## 2024-06-21 ENCOUNTER — Other Ambulatory Visit: Payer: Self-pay | Admitting: Oncology

## 2024-06-21 DIAGNOSIS — C259 Malignant neoplasm of pancreas, unspecified: Secondary | ICD-10-CM

## 2024-06-23 ENCOUNTER — Ambulatory Visit: Payer: Self-pay

## 2024-06-23 ENCOUNTER — Other Ambulatory Visit: Payer: Self-pay

## 2024-06-23 ENCOUNTER — Ambulatory Visit: Payer: Self-pay | Admitting: Nurse Practitioner

## 2024-06-24 ENCOUNTER — Other Ambulatory Visit

## 2024-06-24 ENCOUNTER — Ambulatory Visit

## 2024-06-24 ENCOUNTER — Ambulatory Visit: Admitting: Oncology

## 2024-06-25 ENCOUNTER — Inpatient Hospital Stay: Payer: Self-pay | Admitting: Nurse Practitioner

## 2024-06-25 ENCOUNTER — Inpatient Hospital Stay: Payer: Self-pay

## 2024-06-25 ENCOUNTER — Encounter: Payer: Self-pay | Admitting: Nurse Practitioner

## 2024-06-25 ENCOUNTER — Encounter: Payer: Self-pay | Admitting: Oncology

## 2024-06-25 ENCOUNTER — Inpatient Hospital Stay: Payer: Self-pay | Attending: Hematology

## 2024-06-25 VITALS — BP 133/80 | HR 63 | Temp 97.6°F | Resp 16

## 2024-06-25 VITALS — BP 130/76 | HR 75 | Temp 98.1°F | Resp 18 | Ht 68.0 in | Wt 162.9 lb

## 2024-06-25 DIAGNOSIS — Z452 Encounter for adjustment and management of vascular access device: Secondary | ICD-10-CM | POA: Insufficient documentation

## 2024-06-25 DIAGNOSIS — C787 Secondary malignant neoplasm of liver and intrahepatic bile duct: Secondary | ICD-10-CM | POA: Insufficient documentation

## 2024-06-25 DIAGNOSIS — Z800 Family history of malignant neoplasm of digestive organs: Secondary | ICD-10-CM | POA: Insufficient documentation

## 2024-06-25 DIAGNOSIS — Z5111 Encounter for antineoplastic chemotherapy: Secondary | ICD-10-CM | POA: Insufficient documentation

## 2024-06-25 DIAGNOSIS — C259 Malignant neoplasm of pancreas, unspecified: Secondary | ICD-10-CM

## 2024-06-25 DIAGNOSIS — C252 Malignant neoplasm of tail of pancreas: Secondary | ICD-10-CM | POA: Insufficient documentation

## 2024-06-25 DIAGNOSIS — Z5189 Encounter for other specified aftercare: Secondary | ICD-10-CM | POA: Diagnosis not present

## 2024-06-25 DIAGNOSIS — G629 Polyneuropathy, unspecified: Secondary | ICD-10-CM | POA: Diagnosis not present

## 2024-06-25 LAB — CBC WITH DIFFERENTIAL (CANCER CENTER ONLY)
Abs Immature Granulocytes: 0.18 K/uL — ABNORMAL HIGH (ref 0.00–0.07)
Basophils Absolute: 0 K/uL (ref 0.0–0.1)
Basophils Relative: 1 %
Eosinophils Absolute: 0.1 K/uL (ref 0.0–0.5)
Eosinophils Relative: 1 %
HCT: 34.3 % — ABNORMAL LOW (ref 39.0–52.0)
Hemoglobin: 11.5 g/dL — ABNORMAL LOW (ref 13.0–17.0)
Immature Granulocytes: 3 %
Lymphocytes Relative: 16 %
Lymphs Abs: 1 K/uL (ref 0.7–4.0)
MCH: 34.5 pg — ABNORMAL HIGH (ref 26.0–34.0)
MCHC: 33.5 g/dL (ref 30.0–36.0)
MCV: 103 fL — ABNORMAL HIGH (ref 80.0–100.0)
Monocytes Absolute: 0.5 K/uL (ref 0.1–1.0)
Monocytes Relative: 8 %
Neutro Abs: 4.3 K/uL (ref 1.7–7.7)
Neutrophils Relative %: 71 %
Platelet Count: 146 K/uL — ABNORMAL LOW (ref 150–400)
RBC: 3.33 MIL/uL — ABNORMAL LOW (ref 4.22–5.81)
RDW: 12.8 % (ref 11.5–15.5)
WBC Count: 6 K/uL (ref 4.0–10.5)
nRBC: 0 % (ref 0.0–0.2)

## 2024-06-25 LAB — CMP (CANCER CENTER ONLY)
ALT: 34 U/L (ref 0–44)
AST: 26 U/L (ref 15–41)
Albumin: 4.3 g/dL (ref 3.5–5.0)
Alkaline Phosphatase: 207 U/L — ABNORMAL HIGH (ref 38–126)
Anion gap: 13 (ref 5–15)
BUN: 16 mg/dL (ref 6–20)
CO2: 23 mmol/L (ref 22–32)
Calcium: 9.6 mg/dL (ref 8.9–10.3)
Chloride: 103 mmol/L (ref 98–111)
Creatinine: 0.76 mg/dL (ref 0.61–1.24)
GFR, Estimated: >60 mL/min (ref >60)
Glucose, Bld: 176 mg/dL — ABNORMAL HIGH (ref 70–99)
Potassium: 4.2 mmol/L (ref 3.5–5.1)
Sodium: 138 mmol/L (ref 135–145)
Total Bilirubin: 0.4 mg/dL (ref 0.0–1.2)
Total Protein: 6.9 g/dL (ref 6.5–8.1)

## 2024-06-25 MED ORDER — ATROPINE SULFATE 1 MG/ML IV SOLN
0.5000 mg | Freq: Once | INTRAVENOUS | Status: AC | PRN
Start: 2024-06-25 — End: 2024-06-25
  Administered 2024-06-25: 0.5 mg via INTRAVENOUS
  Filled 2024-06-25: qty 1

## 2024-06-25 MED ORDER — SODIUM CHLORIDE 0.9 % IV SOLN
150.0000 mg | Freq: Once | INTRAVENOUS | Status: AC
  Administered 2024-06-25: 150 mg via INTRAVENOUS
  Filled 2024-06-25: qty 150

## 2024-06-25 MED ORDER — SODIUM CHLORIDE 0.9 % IV SOLN: INTRAVENOUS | Status: DC

## 2024-06-25 MED ORDER — PALONOSETRON HCL INJECTION 0.25 MG/5ML
0.2500 mg | Freq: Once | INTRAVENOUS | Status: AC
  Administered 2024-06-25: 0.25 mg via INTRAVENOUS
  Filled 2024-06-25: qty 5

## 2024-06-25 MED ORDER — SODIUM CHLORIDE 0.9 % IV SOLN
400.0000 mg/m2 | Freq: Once | INTRAVENOUS | Status: AC
  Administered 2024-06-25: 744 mg via INTRAVENOUS
  Filled 2024-06-25: qty 37.2

## 2024-06-25 MED ORDER — SODIUM CHLORIDE 0.9 % IV SOLN
150.0000 mg/m2 | Freq: Once | INTRAVENOUS | Status: AC
  Administered 2024-06-25: 300 mg via INTRAVENOUS
  Filled 2024-06-25: qty 15

## 2024-06-25 MED ORDER — DEXAMETHASONE SODIUM PHOSPHATE 10 MG/ML IJ SOLN
5.0000 mg | Freq: Once | INTRAMUSCULAR | Status: AC
  Administered 2024-06-25: 5 mg via INTRAVENOUS
  Filled 2024-06-25: qty 1

## 2024-06-25 MED ORDER — SODIUM CHLORIDE 0.9 % IV SOLN
2400.0000 mg/m2 | INTRAVENOUS | Status: DC
  Administered 2024-06-25: 4450 mg via INTRAVENOUS
  Filled 2024-06-25: qty 89

## 2024-06-25 NOTE — Progress Notes (Signed)
 Patient seen by Lonna Cobb NP today  Vitals are within treatment parameters:Yes   Labs are within treatment parameters: Yes   Treatment plan has been signed: Yes   Per physician team, Patient is ready for treatment and there are NO modifications to the treatment plan.

## 2024-06-25 NOTE — Patient Instructions (Signed)
 [facility] - A DEPT OF [facility]  Discharge Instructions: Thank you for choosing [facility] to provide your oncology and hematology care.   If you have a lab appointment with the Cancer Center, please go directly to the Cancer Center and check in at the registration area.   Wear comfortable clothing and clothing appropriate for easy access to any Portacath or PICC line.   We strive to give you quality time with your provider. You may need to reschedule your appointment if you arrive late (15 or more minutes).  Arriving late affects you and other patients whose appointments are after yours.  Also, if you miss three or more appointments without notifying the office, you may be dismissed from the clinic at the provider's discretion.      For prescription refill requests, have your pharmacy contact our office and allow 72 hours for refills to be completed.    Today you received the following chemotherapy and/or immunotherapy agents Irinotecan , Leucovorin , and Adrucil .  Irinotecan  Injection What is this medication? IRINOTECAN  (ir in oh TEE kan) treats some types of cancer. It works by slowing down the growth of cancer cells. This medicine may be used for other purposes; ask your health care provider or pharmacist if you have questions. COMMON BRAND NAME(S): Camptosar  What should I tell my care team before I take this medication? They need to know if you have any of these conditions: Dehydration Diarrhea Infection, especially a viral infection, such as chickenpox, cold sores, herpes Liver disease Low blood cell levels (white cells, red cells, and platelets) Low levels of electrolytes, such as calcium , magnesium, or potassium in your blood Recent or ongoing radiation An unusual or allergic reaction to irinotecan , other medications, foods, dyes, or preservatives If you or your partner are pregnant or trying to get pregnant Breast-feeding How  should I use this medication? This medication is injected into a vein. It is given by your care team in a hospital or clinic setting. Talk to your care team about the use of this medication in children. Special care may be needed. Overdosage: If you think you have taken too much of this medicine contact a poison control center or emergency room at once. NOTE: This medicine is only for you. Do not share this medicine with others. What if I miss a dose? Keep appointments for follow-up doses. It is important not to miss your dose. Call your care team if you are unable to keep an appointment. What may interact with this medication? Do not take this medication with any of the following: Cobicistat Itraconazole This medication may also interact with the following: Certain antibiotics, such as clarithromycin, rifampin, rifabutin Certain antivirals for HIV or AIDS Certain medications for fungal infections, such as ketoconazole, posaconazole, voriconazole Certain medications for seizures, such as carbamazepine, phenobarbital, phenytoin Gemfibrozil Nefazodone St. John's wort This list may not describe all possible interactions. Give your health care provider a list of all the medicines, herbs, non-prescription drugs, or dietary supplements you use. Also tell them if you smoke, drink alcohol, or use illegal drugs. Some items may interact with your medicine. What should I watch for while using this medication? Your condition will be monitored carefully while you are receiving this medication. You may need blood work while taking this medication. This medication may make you feel generally unwell. This is not uncommon as chemotherapy can affect healthy cells as well as cancer cells. Report any side effects. Continue your  course of treatment even though you feel ill unless your care team tells you to stop. This medication can cause serious side effects. To reduce the risk, your care team may give you other  medications to take before receiving this one. Be sure to follow the directions from your care team. This medication may affect your coordination, reaction time, or judgement. Do not drive or operate machinery until you know how this medication affects you. Sit up or stand slowly to reduce the risk of dizzy or fainting spells. Drinking alcohol with this medication can increase the risk of these side effects. This medication may increase your risk of getting an infection. Call your care team for advice if you get a fever, chills, sore throat, or other symptoms of a cold or flu. Do not treat yourself. Try to avoid being around people who are sick. Avoid taking medications that contain aspirin, acetaminophen , ibuprofen, naproxen, or ketoprofen unless instructed by your care team. These medications may hide a fever. This medication may increase your risk to bruise or bleed. Call your care team if you notice any unusual bleeding. Be careful brushing or flossing your teeth or using a toothpick because you may get an infection or bleed more easily. If you have any dental work done, tell your dentist you are receiving this medication. Talk to your care team if you or your partner are pregnant or think either of you might be pregnant. This medication can cause serious birth defects if taken during pregnancy and for 6 months after the last dose. You will need a negative pregnancy test before starting this medication. Contraception is recommended while taking this medication and for 6 months after the last dose. Your care team can help you find the option that works for you. Do not father a child while taking this medication and for 3 months after the last dose. Use a condom for contraception during this time period. Do not breastfeed while taking this medication and for 7 days after the last dose. This medication may cause infertility. Talk to your care team if you are concerned about your fertility. What side  effects may I notice from receiving this medication? Side effects that you should report to your care team as soon as possible: Allergic reactions--skin rash, itching, hives, swelling of the face, lips, tongue, or throat Dry cough, shortness of breath or trouble breathing Increased saliva or tears, increased sweating, stomach cramping, diarrhea, small pupils, unusual weakness or fatigue, slow heartbeat Infection--fever, chills, cough, sore throat, wounds that don't heal, pain or trouble when passing urine, general feeling of discomfort or being unwell Kidney injury--decrease in the amount of urine, swelling of the ankles, hands, or feet Low red blood cell level--unusual weakness or fatigue, dizziness, headache, trouble breathing Severe or prolonged diarrhea Unusual bruising or bleeding Side effects that usually do not require medical attention (report to your care team if they continue or are bothersome): Constipation Diarrhea Hair loss Loss of appetite Nausea Stomach pain This list may not describe all possible side effects. Call your doctor for medical advice about side effects. You may report side effects to FDA at 1-800-FDA-1088. Where should I keep my medication? This medication is given in a hospital or clinic. It will not be stored at home. NOTE: This sheet is a summary. It may not cover all possible information. If you have questions about this medicine, talk to your doctor, pharmacist, or health care provider.  2024 Elsevier/Gold Standard (2022-04-16 00:00:00)  Leucovorin  Injection What  is this medication? LEUCOVORIN  (loo koe VOR in) prevents side effects from certain medications, such as methotrexate. It works by increasing folate levels. This helps protect healthy cells in your body. It may also be used to treat anemia caused by low levels of folate. It can also be used with fluorouracil , a type of chemotherapy, to treat colorectal cancer. It works by increasing the effects of  fluorouracil  in the body. This medicine may be used for other purposes; ask your health care provider or pharmacist if you have questions. What should I tell my care team before I take this medication? They need to know if you have any of these conditions: Anemia from low levels of vitamin B12 in the blood An unusual or allergic reaction to leucovorin , folic acid, other medications, foods, dyes, or preservatives Pregnant or trying to get pregnant Breastfeeding How should I use this medication? This medication is injected into a vein or a muscle. It is given by your care team in a hospital or clinic setting. Talk to your care team about the use of this medication in children. Special care may be needed. Overdosage: If you think you have taken too much of this medicine contact a poison control center or emergency room at once. NOTE: This medicine is only for you. Do not share this medicine with others. What if I miss a dose? Keep appointments for follow-up doses. It is important not to miss your dose. Call your care team if you are unable to keep an appointment. What may interact with this medication? Capecitabine Fluorouracil  Phenobarbital Phenytoin Primidone Trimethoprim;sulfamethoxazole This list may not describe all possible interactions. Give your health care provider a list of all the medicines, herbs, non-prescription drugs, or dietary supplements you use. Also tell them if you smoke, drink alcohol, or use illegal drugs. Some items may interact with your medicine. What should I watch for while using this medication? Your condition will be monitored carefully while you are receiving this medication. This medication may increase the side effects of 5-fluorouracil . Tell your care team if you have diarrhea or mouth sores that do not get better or that get worse. What side effects may I notice from receiving this medication? Side effects that you should report to your care team as soon as  possible: Allergic reactions--skin rash, itching, hives, swelling of the face, lips, tongue, or throat This list may not describe all possible side effects. Call your doctor for medical advice about side effects. You may report side effects to FDA at 1-800-FDA-1088. Where should I keep my medication? This medication is given in a hospital or clinic. It will not be stored at home. NOTE: This sheet is a summary. It may not cover all possible information. If you have questions about this medicine, talk to your doctor, pharmacist, or health care provider.  2024 Elsevier/Gold Standard (2022-05-08 00:00:00)  Fluorouracil  Injection What is this medication? FLUOROURACIL  (flure oh YOOR a sil) treats some types of cancer. It works by slowing down the growth of cancer cells. This medicine may be used for other purposes; ask your health care provider or pharmacist if you have questions. COMMON BRAND NAME(S): Adrucil  What should I tell my care team before I take this medication? They need to know if you have any of these conditions: Blood disorders Dihydropyrimidine dehydrogenase (DPD) deficiency Infection, such as chickenpox, cold sores, herpes Kidney disease Liver disease Poor nutrition Recent or ongoing radiation therapy An unusual or allergic reaction to fluorouracil , other medications, foods, dyes, or  preservatives If you or your partner are pregnant or trying to get pregnant Breast-feeding How should I use this medication? This medication is injected into a vein. It is administered by your care team in a hospital or clinic setting. Talk to your care team about the use of this medication in children. Special care may be needed. Overdosage: If you think you have taken too much of this medicine contact a poison control center or emergency room at once. NOTE: This medicine is only for you. Do not share this medicine with others. What if I miss a dose? Keep appointments for follow-up doses. It is  important not to miss your dose. Call your care team if you are unable to keep an appointment. What may interact with this medication? Do not take this medication with any of the following: Live virus vaccines This medication may also interact with the following: Medications that treat or prevent blood clots, such as warfarin, enoxaparin, dalteparin This list may not describe all possible interactions. Give your health care provider a list of all the medicines, herbs, non-prescription drugs, or dietary supplements you use. Also tell them if you smoke, drink alcohol, or use illegal drugs. Some items may interact with your medicine. What should I watch for while using this medication? Your condition will be monitored carefully while you are receiving this medication. This medication may make you feel generally unwell. This is not uncommon as chemotherapy can affect healthy cells as well as cancer cells. Report any side effects. Continue your course of treatment even though you feel ill unless your care team tells you to stop. In some cases, you may be given additional medications to help with side effects. Follow all directions for their use. This medication may increase your risk of getting an infection. Call your care team for advice if you get a fever, chills, sore throat, or other symptoms of a cold or flu. Do not treat yourself. Try to avoid being around people who are sick. This medication may increase your risk to bruise or bleed. Call your care team if you notice any unusual bleeding. Be careful brushing or flossing your teeth or using a toothpick because you may get an infection or bleed more easily. If you have any dental work done, tell your dentist you are receiving this medication. Avoid taking medications that contain aspirin, acetaminophen , ibuprofen, naproxen, or ketoprofen unless instructed by your care team. These medications may hide a fever. Do not treat diarrhea with over the  counter products. Contact your care team if you have diarrhea that lasts more than 2 days or if it is severe and watery. This medication can make you more sensitive to the sun. Keep out of the sun. If you cannot avoid being in the sun, wear protective clothing and sunscreen. Do not use sun lamps, tanning beds, or tanning booths. Talk to your care team if you or your partner wish to become pregnant or think you might be pregnant. This medication can cause serious birth defects if taken during pregnancy and for 3 months after the last dose. A reliable form of contraception is recommended while taking this medication and for 3 months after the last dose. Talk to your care team about effective forms of contraception. Do not father a child while taking this medication and for 3 months after the last dose. Use a condom while having sex during this time period. Do not breastfeed while taking this medication. This medication may cause infertility. Talk to your  care team if you are concerned about your fertility. What side effects may I notice from receiving this medication? Side effects that you should report to your care team as soon as possible: Allergic reactions--skin rash, itching, hives, swelling of the face, lips, tongue, or throat Heart attack--pain or tightness in the chest, shoulders, arms, or jaw, nausea, shortness of breath, cold or clammy skin, feeling faint or lightheaded Heart failure--shortness of breath, swelling of the ankles, feet, or hands, sudden weight gain, unusual weakness or fatigue Heart rhythm changes--fast or irregular heartbeat, dizziness, feeling faint or lightheaded, chest pain, trouble breathing High ammonia level--unusual weakness or fatigue, confusion, loss of appetite, nausea, vomiting, seizures Infection--fever, chills, cough, sore throat, wounds that don't heal, pain or trouble when passing urine, general feeling of discomfort or being unwell Low red blood cell  level--unusual weakness or fatigue, dizziness, headache, trouble breathing Pain, tingling, or numbness in the hands or feet, muscle weakness, change in vision, confusion or trouble speaking, loss of balance or coordination, trouble walking, seizures Redness, swelling, and blistering of the skin over hands and feet Severe or prolonged diarrhea Unusual bruising or bleeding Side effects that usually do not require medical attention (report to your care team if they continue or are bothersome): Dry skin Headache Increased tears Nausea Pain, redness, or swelling with sores inside the mouth or throat Sensitivity to light Vomiting This list may not describe all possible side effects. Call your doctor for medical advice about side effects. You may report side effects to FDA at 1-800-FDA-1088. Where should I keep my medication? This medication is given in a hospital or clinic. It will not be stored at home. NOTE: This sheet is a summary. It may not cover all possible information. If you have questions about this medicine, talk to your doctor, pharmacist, or health care provider.  2024 Elsevier/Gold Standard (2022-04-10 00:00:00)   To help prevent nausea and vomiting after your treatment, we encourage you to take your nausea medication as directed.  BELOW ARE SYMPTOMS THAT SHOULD BE REPORTED IMMEDIATELY: *FEVER GREATER THAN 100.4 F (38 C) OR HIGHER *CHILLS OR SWEATING *NAUSEA AND VOMITING THAT IS NOT CONTROLLED WITH YOUR NAUSEA MEDICATION *UNUSUAL SHORTNESS OF BREATH *UNUSUAL BRUISING OR BLEEDING *URINARY PROBLEMS (pain or burning when urinating, or frequent urination) *BOWEL PROBLEMS (unusual diarrhea, constipation, pain near the anus) TENDERNESS IN MOUTH AND THROAT WITH OR WITHOUT PRESENCE OF ULCERS (sore throat, sores in mouth, or a toothache) UNUSUAL RASH, SWELLING OR PAIN  UNUSUAL VAGINAL DISCHARGE OR ITCHING   Items with * indicate a potential emergency and should be followed up as  soon as possible or go to the Emergency Department if any problems should occur.  Please show the CHEMOTHERAPY ALERT CARD or IMMUNOTHERAPY ALERT CARD at check-in to the Emergency Department and triage nurse.  Should you have questions after your visit or need to cancel or reschedule your appointment, please contact [facility] - A DEPT OF [facility]  Dept: [phone]  and follow the prompts.  Office hours are 8:00 a.m. to 4:30 p.m. Monday - Friday. Please note that voicemails left after 4:00 p.m. may not be returned until the following business day.  We are closed weekends and major holidays. You have access to a nurse at all times for urgent questions. Please call the main number to the clinic Dept: [phone] and follow the prompts.   For any non-urgent questions, you may also contact your provider using MyChart. We now offer e-Visits  for anyone 75 and older to request care online for non-urgent symptoms. For details visit [email or web address].   Also download the MyChart app! Go to the app store, search MyChart, open the app, select [place], and log in with your MyChart username and password.

## 2024-06-25 NOTE — Progress Notes (Signed)
  [facility] OFFICE PROGRESS NOTE   Diagnosis: Pancreas cancer  INTERVAL HISTORY:   David [name] returns as scheduled.  He completed another cycle of FOLFIRI 06/11/2024.  He had mild hiccups for less than 1 day.  No nausea or vomiting.  No mouth sores.  No diarrhea.  Neuropathy symptoms in the hands improved, stable feet.  No abdominal pain.  Objective:  Vital signs in last 24 hours:  Blood pressure 130/76, pulse 75, temperature 98.1 F (36.7 C), temperature source Temporal, resp. rate 18, height 5' 8 (1.727 m), weight 162 lb 14.4 oz (73.9 kg), SpO2 100%.    HEENT: No thrush or ulcers. Resp: Lungs clear bilaterally. Cardio: Regular rate and rhythm. GI: Abdomen soft and nontender.  No hepatosplenomegaly.  No mass or ascites. Vascular: No leg edema. Skin: Palms without erythema. Port-A-Cath without erythema.  Lab Results:  Lab Results  Component Value Date   WBC 6.0 06/25/2024   HGB 11.5 (L) 06/25/2024   HCT 34.3 (L) 06/25/2024   MCV 103.0 (H) 06/25/2024   PLT 146 (L) 06/25/2024   NEUTROABS 4.3 06/25/2024    Imaging:  No results found.  Medications: I have reviewed the patient's current medications.  Assessment/Plan: Pancreas cancer CT angiogram chest 04/24/2023-low-density mass in the dome of the liver with at least 3 additional lesions in both hepatic lobes, borderline lymphadenopathy in the hepatoduodenal ligament Right upper quadrant ultrasound 04/24/2023-large circumscribed mass of the liver dome CT abdomen/pelvis 04/24/2023-multiple liver lesions poorly defined on noncontrast exam, prominent portacaval and porta hepatis nodes MRI abdomen 05/05/2023-multiple rim-hypoenhancing liver lesions consistent with hepatic metastases, suspicion for a pancreas tail mass Ultrasound-guided biopsy of dominant right liver lesion 05/10/2023-adenocarcinoma, CK7 and CDX2 positive, abundant cytoplasm and mucin with extracellular mucin, foci of lymphovascular invasion Tempus  gene panel-K-ras G12V, T P53 mild tumor mutation burden 3.2, MSS PET 06/04/2023-multiple hypermetabolic liver lesions consistent with metastases, low-level FDG uptake in the soft tissue fullness at the tip of the pancreas tail, low-level FDG activity involving a small soft tissue nodule between the gastric fundus and spleen, tiny foci of accumulation at the right costovertebral junction at T10 and roof of the left acetabulum without an underlying CT lesion Elevated CEA and CA 19-9 EUS 06/07/2023 ,22x 15 mm irregular mass in the tail the pancreas, 11 mm subcarinal lymph node, FNA biopsy of the pancreas mass-suspicious for malignancy Cycle 1 FOLFIRINOX 06/19/2023 Cycle 2 FOLFIRINOX 07/03/2023 Cycle 3 FOLFIRINOX 07/17/2023 Cycle 4 FOLFIRINOX 07/31/2023 Cycle 5 FOLFIRINOX 08/14/2023, oxaliplatin  and Decadron  dose reduced Cycle 6 FOLFIRINOX 08/28/2023 Cycle 7 FOLFIRINOX 09/11/2023 CT[facility] at [facility] 09/16/2023, compared to 04/24/2023-unchanged pancreas tail mass, decrease in attenuation of hepatic metastases, several lesions have decreased in size, no new lesions Cycle 8 FOLFIRINOX 09/25/2023 Cycle 9 FOLFIRINOX 10/09/2023 Cycle 10 FOLFIRINOX 10/22/2023 Cycle 11 FOLFIRINOX 11/05/2023 Cycle 12 FOLFIRINOX 11/20/2023 Cycle 13 FOLFIRINOX 12/04/2023 Cycle 14 FOLFIRINOX 12/19/2023 CTs at [facility] 12/23/2023: Stable to slight decrease in hepatic metastases, no new lesions, stable pancreas tail mass MRI liver at [facility] 12/26/2023: Multifocal nonenhancing hepatic metastases, pancreas tail mass not well-visualized Cycle 15 FOLFIRINOX 01/01/2024, 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 Cycle 16 FOLFIRINOX 01/15/2024, 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 Cycle 17 FOLFIRINOX 02/05/2024, 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 Cycle 18 FOLFIRINOX 02/19/2024, 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 Cycle 19 FOLFIRINOX 03/04/2024, 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 Cycle 20 FOLFIRINOX 03/18/2024, 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 CTs  at [facility] 03/27/2024: Stable hepatic metastases, no new hepatic lesion, pancreas tail lesion not well-visualized unchanged right lower lobe  groundglass opacity Cycle 21 FOLFIRINOX 04/01/2024, 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 Cycle 22 FOLFIRINOX 04/15/2024, 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 Cycle 23 FOLFIRINOX 04/29/2024, 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 Cycle 24 FOLFIRINOX 05/20/2024, 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 Cycle 25 FOLFIRINOX 06/11/2024, 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 Cycle 26 FOLFIRINOX 06/25/2024, 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  Resolved with Reglan .  Recurrent hiccups following cycle 4 FOLFIRINOX-not relieved with Reglan     Disposition: David [name] appears stable.  He has completed 25 cycles of FOLFIRINOX.  Oxaliplatin  remains on hold due to neuropathy.  He continues to tolerate treatment well.  There is no clinical evidence of disease progression.  Most recent CA 19-9 stable.  Plan to proceed with cycle 26 today as scheduled.  CBC and chemistry panel reviewed.  Labs adequate for treatment.  He is scheduled for scans/follow-up with Dr. Wendell 07/03/2024.  He will return for follow-up and treatment in 2 weeks.  He will contact the office in the interim with any problems.    David [name] ANP/GNP-BC   06/25/2024  10:30 AM

## 2024-06-26 ENCOUNTER — Other Ambulatory Visit: Payer: Self-pay

## 2024-06-26 ENCOUNTER — Encounter

## 2024-06-26 LAB — CANCER ANTIGEN 19-9: CA 19-9: 46 U/mL — ABNORMAL HIGH (ref 0–35)

## 2024-06-27 ENCOUNTER — Inpatient Hospital Stay: Payer: Self-pay

## 2024-06-27 VITALS — BP 128/77 | HR 80 | Temp 99.3°F | Resp 18

## 2024-06-27 DIAGNOSIS — Z5111 Encounter for antineoplastic chemotherapy: Secondary | ICD-10-CM | POA: Diagnosis not present

## 2024-06-27 DIAGNOSIS — C259 Malignant neoplasm of pancreas, unspecified: Secondary | ICD-10-CM

## 2024-06-27 MED ORDER — HEPARIN SOD (PORK) LOCK FLUSH 100 UNIT/ML IV SOLN
250.0000 [IU] | Freq: Once | INTRAVENOUS | Status: AC | PRN
  Administered 2024-06-27: 250 [IU]

## 2024-06-27 MED ORDER — PEGFILGRASTIM-JMDB 6 MG/0.6ML ~~LOC~~ SOSY
6.0000 mg | PREFILLED_SYRINGE | Freq: Once | SUBCUTANEOUS | Status: AC
  Administered 2024-06-27: 6 mg via SUBCUTANEOUS

## 2024-06-27 MED ORDER — SODIUM CHLORIDE 0.9% FLUSH
10.0000 mL | INTRAVENOUS | Status: DC | PRN
  Administered 2024-06-27: 10 mL

## 2024-07-01 ENCOUNTER — Other Ambulatory Visit

## 2024-07-01 ENCOUNTER — Ambulatory Visit: Admitting: Oncology

## 2024-07-01 ENCOUNTER — Ambulatory Visit

## 2024-07-03 ENCOUNTER — Encounter

## 2024-07-03 ENCOUNTER — Other Ambulatory Visit: Payer: Self-pay | Admitting: Oncology

## 2024-07-03 DIAGNOSIS — C259 Malignant neoplasm of pancreas, unspecified: Secondary | ICD-10-CM

## 2024-07-07 MED ORDER — SODIUM CHLORIDE 0.9 % IV SOLN
2400.0000 mg/m2 | INTRAVENOUS | Status: DC
  Administered 2024-07-08: 4450 mg via INTRAVENOUS
  Filled 2024-07-07: qty 89

## 2024-07-08 ENCOUNTER — Other Ambulatory Visit

## 2024-07-08 ENCOUNTER — Inpatient Hospital Stay

## 2024-07-08 ENCOUNTER — Inpatient Hospital Stay (HOSPITAL_BASED_OUTPATIENT_CLINIC_OR_DEPARTMENT_OTHER): Admitting: Oncology

## 2024-07-08 ENCOUNTER — Ambulatory Visit: Admitting: Oncology

## 2024-07-08 ENCOUNTER — Ambulatory Visit

## 2024-07-08 VITALS — BP 136/84 | HR 100 | Temp 98.0°F | Resp 18 | Ht 68.0 in | Wt 158.0 lb

## 2024-07-08 DIAGNOSIS — C787 Secondary malignant neoplasm of liver and intrahepatic bile duct: Secondary | ICD-10-CM

## 2024-07-08 DIAGNOSIS — C259 Malignant neoplasm of pancreas, unspecified: Secondary | ICD-10-CM

## 2024-07-08 DIAGNOSIS — Z95828 Presence of other vascular implants and grafts: Secondary | ICD-10-CM

## 2024-07-08 DIAGNOSIS — Z5111 Encounter for antineoplastic chemotherapy: Secondary | ICD-10-CM | POA: Diagnosis not present

## 2024-07-08 LAB — CMP (CANCER CENTER ONLY)
ALT: 42 U/L (ref 0–44)
AST: 31 U/L (ref 15–41)
Albumin: 4.6 g/dL (ref 3.5–5.0)
Alkaline Phosphatase: 248 U/L — ABNORMAL HIGH (ref 38–126)
Anion gap: 13 (ref 5–15)
BUN: 14 mg/dL (ref 6–20)
CO2: 23 mmol/L (ref 22–32)
Calcium: 10.1 mg/dL (ref 8.9–10.3)
Chloride: 103 mmol/L (ref 98–111)
Creatinine: 0.93 mg/dL (ref 0.61–1.24)
GFR, Estimated: >60 mL/min (ref >60)
Glucose, Bld: 175 mg/dL — ABNORMAL HIGH (ref 70–99)
Potassium: 4.3 mmol/L (ref 3.5–5.1)
Sodium: 140 mmol/L (ref 135–145)
Total Bilirubin: 0.6 mg/dL (ref 0.0–1.2)
Total Protein: 7.6 g/dL (ref 6.5–8.1)

## 2024-07-08 LAB — CBC WITH DIFFERENTIAL (CANCER CENTER ONLY)
Abs Immature Granulocytes: 0.18 K/uL — ABNORMAL HIGH (ref 0.00–0.07)
Basophils Absolute: 0 K/uL (ref 0.0–0.1)
Basophils Relative: 0 %
Eosinophils Absolute: 0.1 K/uL (ref 0.0–0.5)
Eosinophils Relative: 1 %
HCT: 36.5 % — ABNORMAL LOW (ref 39.0–52.0)
Hemoglobin: 12.3 g/dL — ABNORMAL LOW (ref 13.0–17.0)
Immature Granulocytes: 2 %
Lymphocytes Relative: 15 %
Lymphs Abs: 1.2 K/uL (ref 0.7–4.0)
MCH: 34.4 pg — ABNORMAL HIGH (ref 26.0–34.0)
MCHC: 33.7 g/dL (ref 30.0–36.0)
MCV: 102 fL — ABNORMAL HIGH (ref 80.0–100.0)
Monocytes Absolute: 0.6 K/uL (ref 0.1–1.0)
Monocytes Relative: 8 %
Neutro Abs: 5.9 K/uL (ref 1.7–7.7)
Neutrophils Relative %: 74 %
Platelet Count: 140 K/uL — ABNORMAL LOW (ref 150–400)
RBC: 3.58 MIL/uL — ABNORMAL LOW (ref 4.22–5.81)
RDW: 13.1 % (ref 11.5–15.5)
WBC Count: 8 K/uL (ref 4.0–10.5)
nRBC: 0 % (ref 0.0–0.2)

## 2024-07-08 MED ORDER — SODIUM CHLORIDE 0.9 % IV SOLN
400.0000 mg/m2 | Freq: Once | INTRAVENOUS | Status: AC
  Administered 2024-07-08: 744 mg via INTRAVENOUS
  Filled 2024-07-08: qty 37.2

## 2024-07-08 MED ORDER — PALONOSETRON HCL INJECTION 0.25 MG/5ML
0.2500 mg | Freq: Once | INTRAVENOUS | Status: AC
  Administered 2024-07-08: 0.25 mg via INTRAVENOUS
  Filled 2024-07-08: qty 5

## 2024-07-08 MED ORDER — SODIUM CHLORIDE 0.9 % IV SOLN
150.0000 mg | Freq: Once | INTRAVENOUS | Status: AC
  Administered 2024-07-08: 150 mg via INTRAVENOUS
  Filled 2024-07-08: qty 150

## 2024-07-08 MED ORDER — ATROPINE SULFATE 1 MG/ML IV SOLN
0.5000 mg | Freq: Once | INTRAVENOUS | Status: AC | PRN
Start: 2024-07-08 — End: 2024-07-08
  Administered 2024-07-08: 0.5 mg via INTRAVENOUS
  Filled 2024-07-08: qty 1

## 2024-07-08 MED ORDER — DEXAMETHASONE SODIUM PHOSPHATE 10 MG/ML IJ SOLN
5.0000 mg | Freq: Once | INTRAMUSCULAR | Status: AC
  Administered 2024-07-08: 5 mg via INTRAVENOUS
  Filled 2024-07-08: qty 1

## 2024-07-08 MED ORDER — PANTOPRAZOLE SODIUM 40 MG PO TBEC: 40.0000 mg | DELAYED_RELEASE_TABLET | Freq: Every day | ORAL | 3 refills | Status: DC

## 2024-07-08 MED ORDER — SODIUM CHLORIDE 0.9 % IV SOLN
150.0000 mg/m2 | Freq: Once | INTRAVENOUS | Status: AC
  Administered 2024-07-08: 300 mg via INTRAVENOUS
  Filled 2024-07-08: qty 15

## 2024-07-08 MED ORDER — SODIUM CHLORIDE 0.9 % IV SOLN: INTRAVENOUS | Status: DC

## 2024-07-08 MED ORDER — SODIUM CHLORIDE 0.9% FLUSH
10.0000 mL | INTRAVENOUS | Status: AC | PRN
  Administered 2024-07-08: 10 mL

## 2024-07-08 NOTE — Progress Notes (Signed)
 Patient seen by Dr. Thornton Papas today  Vitals are within treatment parameters:Yes   Labs are within treatment parameters: Yes   Treatment plan has been signed: Yes   Per physician team, Patient is ready for treatment and there are NO modifications to the treatment plan.

## 2024-07-08 NOTE — Progress Notes (Signed)
 [facility] OFFICE PROGRESS NOTE   Diagnosis: Pancreas cancer  INTERVAL HISTORY:   David [name] complete another cycle of FOLFIRI on 06/25/2024.  No nausea/vomiting, mouth sores, or diarrhea.  He has intermittent heart burn .  He takes over-the-counter antacids.  He reports improvement in hiccups following the last few cycles of chemotherapy.  He continues to have neuropathy symptoms in the feet.  Objective:  Vital signs in last 24 hours:  Blood pressure 136/84, pulse 100, temperature 98 F (36.7 C), temperature source Temporal, resp. rate 18, height 5' 8 (1.727 m), weight 158 lb (71.7 kg), SpO2 100%.    HEENT: No thrush or ulcers Resp: Lungs clear bilaterally Cardio: Regular rate and rhythm GI: No hepatosplenomegaly, no mass, nontender Vascular: No leg edema   Portacath/PICC-without erythema  Lab Results:  Lab Results  Component Value Date   WBC 8.0 07/08/2024   HGB 12.3 (L) 07/08/2024   HCT 36.5 (L) 07/08/2024   MCV 102.0 (H) 07/08/2024   PLT 140 (L) 07/08/2024   NEUTROABS 5.9 07/08/2024    CMP  Lab Results  Component Value Date   NA 138 06/25/2024   K 4.2 06/25/2024   CL 103 06/25/2024   CO2 23 06/25/2024   GLUCOSE 176 (H) 06/25/2024   BUN 16 06/25/2024   CREATININE 0.76 06/25/2024   CALCIUM  9.6 06/25/2024   PROT 6.9 06/25/2024   ALBUMIN 4.3 06/25/2024   AST 26 06/25/2024   ALT 34 06/25/2024   ALKPHOS 207 (H) 06/25/2024   BILITOT 0.4 06/25/2024   GFRNONAA >60 06/25/2024   GFRAA >60 03/26/2018    Lab Results  Component Value Date   CEA 772.45 (H) 05/02/2023   CAN199 46 (H) 06/25/2024    Lab Results  Component Value Date   INR 0.9 05/10/2023   LABPROT 12.7 05/10/2023    Imaging:  No results found.  Medications: I have reviewed the patient's current medications.   Assessment/Plan: Pancreas cancer CT angiogram chest 04/24/2023-low-density mass in the dome of the liver with at least 3 additional lesions in both hepatic  lobes, borderline lymphadenopathy in the hepatoduodenal ligament Right upper quadrant ultrasound 04/24/2023-large circumscribed mass of the liver dome CT abdomen/pelvis 04/24/2023-multiple liver lesions poorly defined on noncontrast exam, prominent portacaval and porta hepatis nodes MRI abdomen 05/05/2023-multiple rim-hypoenhancing liver lesions consistent with hepatic metastases, suspicion for a pancreas tail mass Ultrasound-guided biopsy of dominant right liver lesion 05/10/2023-adenocarcinoma, CK7 and CDX2 positive, abundant cytoplasm and mucin with extracellular mucin, foci of lymphovascular invasion Tempus gene panel-K-ras G12V, T P53 mild tumor mutation burden 3.2, MSS PET 06/04/2023-multiple hypermetabolic liver lesions consistent with metastases, low-level FDG uptake in the soft tissue fullness at the tip of the pancreas tail, low-level FDG activity involving a small soft tissue nodule between the gastric fundus and spleen, tiny foci of accumulation at the right costovertebral junction at T10 and roof of the left acetabulum without an underlying CT lesion Elevated CEA and CA 19-9 EUS 06/07/2023 ,22x 15 mm irregular mass in the tail the pancreas, 11 mm subcarinal lymph node, FNA biopsy of the pancreas mass-suspicious for malignancy Cycle 1 FOLFIRINOX 06/19/2023 Cycle 2 FOLFIRINOX 07/03/2023 Cycle 3 FOLFIRINOX 07/17/2023 Cycle 4 FOLFIRINOX 07/31/2023 Cycle 5 FOLFIRINOX 08/14/2023, oxaliplatin  and Decadron  dose reduced Cycle 6 FOLFIRINOX 08/28/2023 Cycle 7 FOLFIRINOX 09/11/2023 CTs at [facility] 09/16/2023, compared to 04/24/2023-unchanged pancreas tail mass, decrease in attenuation of hepatic metastases, several lesions have decreased in size, no new lesions Cycle 8 FOLFIRINOX 09/25/2023 Cycle 9 FOLFIRINOX 10/09/2023 Cycle 10  FOLFIRINOX 10/22/2023 Cycle 11 FOLFIRINOX 11/05/2023 Cycle 12 FOLFIRINOX 11/20/2023 Cycle 13 FOLFIRINOX 12/04/2023 Cycle 14 FOLFIRINOX 12/19/2023 CTs at [facility] 12/23/2023: Stable to slight decrease  in hepatic metastases, no new lesions, stable pancreas tail mass MRI liver at [facility] 12/26/2023: Multifocal nonenhancing hepatic metastases, pancreas tail mass not well-visualized Cycle 15 FOLFIRINOX 01/01/2024, 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 Cycle 16 FOLFIRINOX 01/15/2024, 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 Cycle 17 FOLFIRINOX 02/05/2024, 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 Cycle 18 FOLFIRINOX 02/19/2024, 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 Cycle 19 FOLFIRINOX 03/04/2024, 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 Cycle 20 FOLFIRINOX 03/18/2024, 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 CTs at [facility] 03/27/2024: Stable hepatic metastases, no new hepatic lesion, pancreas tail lesion not well-visualized unchanged right lower lobe groundglass opacity Cycle 21 FOLFIRINOX 04/01/2024, 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 Cycle 22 FOLFIRINOX 04/15/2024, 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 Cycle 23 FOLFIRINOX 04/29/2024, 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 Cycle 24 FOLFIRINOX 05/20/2024, 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 Cycle 25 FOLFIRINOX 06/11/2024, 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 Cycle 26 FOLFIRINOX 06/25/2024, 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 Cycle 27 FOLFIRINOX 07/08/2024, 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  Resolved with Reglan .  Recurrent hiccups following cycle 4 FOLFIRINOX-not relieved with Reglan       Disposition: David [name] appears stable.  He continues to tolerate the chemotherapy well.  There is no clinical evidence for progression of the pancreas cancer.  He will begin a trial of pantoprazole  for reflux symptoms.  The CA 19-9 has been stable over the past few months.  He will complete another cycle of FOLFIRI today. David [name] will undergo restaging evaluation at [facility] next week.  He will  return for an office visit in 2 weeks.  Arley Hof, MD  07/08/2024  9:58 AM

## 2024-07-08 NOTE — Patient Instructions (Signed)

## 2024-07-09 LAB — CANCER ANTIGEN 19-9: CA 19-9: 51 U/mL — ABNORMAL HIGH (ref 0–35)

## 2024-07-10 ENCOUNTER — Inpatient Hospital Stay

## 2024-07-10 VITALS — BP 135/80 | HR 77 | Temp 98.1°F | Resp 18

## 2024-07-10 DIAGNOSIS — C259 Malignant neoplasm of pancreas, unspecified: Secondary | ICD-10-CM

## 2024-07-10 DIAGNOSIS — Z5111 Encounter for antineoplastic chemotherapy: Secondary | ICD-10-CM | POA: Diagnosis not present

## 2024-07-10 MED ORDER — SODIUM CHLORIDE 0.9% FLUSH
10.0000 mL | INTRAVENOUS | Status: DC | PRN
  Administered 2024-07-10: 10 mL

## 2024-07-10 MED ORDER — PEGFILGRASTIM-JMDB 6 MG/0.6ML ~~LOC~~ SOSY
6.0000 mg | PREFILLED_SYRINGE | Freq: Once | SUBCUTANEOUS | Status: AC
Start: 2024-07-10 — End: 2024-07-10
  Administered 2024-07-10: 6 mg via SUBCUTANEOUS
  Filled 2024-07-10: qty 0.6

## 2024-07-10 MED ORDER — HEPARIN SOD (PORK) LOCK FLUSH 100 UNIT/ML IV SOLN
500.0000 [IU] | Freq: Once | INTRAVENOUS | Status: AC | PRN
  Administered 2024-07-10: 500 [IU]

## 2024-07-18 ENCOUNTER — Other Ambulatory Visit: Payer: Self-pay | Admitting: Oncology

## 2024-07-18 DIAGNOSIS — C259 Malignant neoplasm of pancreas, unspecified: Secondary | ICD-10-CM

## 2024-07-21 MED ORDER — SODIUM CHLORIDE 0.9 % IV SOLN
2400.0000 mg/m2 | INTRAVENOUS | Status: DC
  Administered 2024-07-22: 4450 mg via INTRAVENOUS
  Filled 2024-07-21: qty 89

## 2024-07-22 ENCOUNTER — Inpatient Hospital Stay

## 2024-07-22 ENCOUNTER — Inpatient Hospital Stay (HOSPITAL_BASED_OUTPATIENT_CLINIC_OR_DEPARTMENT_OTHER): Admitting: Oncology

## 2024-07-22 ENCOUNTER — Inpatient Hospital Stay: Attending: Hematology

## 2024-07-22 ENCOUNTER — Inpatient Hospital Stay: Payer: Self-pay

## 2024-07-22 ENCOUNTER — Other Ambulatory Visit: Payer: Self-pay | Admitting: *Deleted

## 2024-07-22 VITALS — BP 147/74 | HR 66 | Temp 98.0°F | Resp 18

## 2024-07-22 VITALS — BP 121/79 | HR 77 | Temp 98.1°F | Resp 18 | Ht 68.0 in | Wt 161.2 lb

## 2024-07-22 DIAGNOSIS — C259 Malignant neoplasm of pancreas, unspecified: Secondary | ICD-10-CM

## 2024-07-22 DIAGNOSIS — G629 Polyneuropathy, unspecified: Secondary | ICD-10-CM | POA: Diagnosis not present

## 2024-07-22 DIAGNOSIS — C252 Malignant neoplasm of tail of pancreas: Secondary | ICD-10-CM | POA: Insufficient documentation

## 2024-07-22 DIAGNOSIS — Z5189 Encounter for other specified aftercare: Secondary | ICD-10-CM | POA: Insufficient documentation

## 2024-07-22 DIAGNOSIS — Z800 Family history of malignant neoplasm of digestive organs: Secondary | ICD-10-CM | POA: Diagnosis not present

## 2024-07-22 DIAGNOSIS — C787 Secondary malignant neoplasm of liver and intrahepatic bile duct: Secondary | ICD-10-CM | POA: Diagnosis present

## 2024-07-22 DIAGNOSIS — Z5111 Encounter for antineoplastic chemotherapy: Secondary | ICD-10-CM | POA: Diagnosis present

## 2024-07-22 LAB — CBC WITH DIFFERENTIAL (CANCER CENTER ONLY)
Abs Immature Granulocytes: 0.19 K/uL — ABNORMAL HIGH (ref 0.00–0.07)
Basophils Absolute: 0 K/uL (ref 0.0–0.1)
Basophils Relative: 1 %
Eosinophils Absolute: 0.1 K/uL (ref 0.0–0.5)
Eosinophils Relative: 2 %
HCT: 35.3 % — ABNORMAL LOW (ref 39.0–52.0)
Hemoglobin: 12 g/dL — ABNORMAL LOW (ref 13.0–17.0)
Immature Granulocytes: 3 %
Lymphocytes Relative: 14 %
Lymphs Abs: 0.9 K/uL (ref 0.7–4.0)
MCH: 34.2 pg — ABNORMAL HIGH (ref 26.0–34.0)
MCHC: 34 g/dL (ref 30.0–36.0)
MCV: 100.6 fL — ABNORMAL HIGH (ref 80.0–100.0)
Monocytes Absolute: 0.6 K/uL (ref 0.1–1.0)
Monocytes Relative: 10 %
Neutro Abs: 4.3 K/uL (ref 1.7–7.7)
Neutrophils Relative %: 70 %
Platelet Count: 160 K/uL (ref 150–400)
RBC: 3.51 MIL/uL — ABNORMAL LOW (ref 4.22–5.81)
RDW: 13.1 % (ref 11.5–15.5)
WBC Count: 6.1 K/uL (ref 4.0–10.5)
nRBC: 0 % (ref 0.0–0.2)

## 2024-07-22 LAB — CMP (CANCER CENTER ONLY)
ALT: 48 U/L — ABNORMAL HIGH (ref 0–44)
AST: 31 U/L (ref 15–41)
Albumin: 4.7 g/dL (ref 3.5–5.0)
Alkaline Phosphatase: 235 U/L — ABNORMAL HIGH (ref 38–126)
Anion gap: 14 (ref 5–15)
BUN: 13 mg/dL (ref 6–20)
CO2: 25 mmol/L (ref 22–32)
Calcium: 10.3 mg/dL (ref 8.9–10.3)
Chloride: 100 mmol/L (ref 98–111)
Creatinine: 0.91 mg/dL (ref 0.61–1.24)
GFR, Estimated: >60 mL/min (ref >60)
Glucose, Bld: 195 mg/dL — ABNORMAL HIGH (ref 70–99)
Potassium: 4.3 mmol/L (ref 3.5–5.1)
Sodium: 139 mmol/L (ref 135–145)
Total Bilirubin: 0.8 mg/dL (ref 0.0–1.2)
Total Protein: 7.3 g/dL (ref 6.5–8.1)

## 2024-07-22 MED ORDER — DEXAMETHASONE SODIUM PHOSPHATE 10 MG/ML IJ SOLN
5.0000 mg | Freq: Once | INTRAMUSCULAR | Status: AC
  Administered 2024-07-22: 5 mg via INTRAVENOUS
  Filled 2024-07-22: qty 1

## 2024-07-22 MED ORDER — PALONOSETRON HCL INJECTION 0.25 MG/5ML
0.2500 mg | Freq: Once | INTRAVENOUS | Status: AC
  Administered 2024-07-22: 0.25 mg via INTRAVENOUS
  Filled 2024-07-22: qty 5

## 2024-07-22 MED ORDER — SODIUM CHLORIDE 0.9 % IV SOLN
150.0000 mg | Freq: Once | INTRAVENOUS | Status: AC
  Administered 2024-07-22: 150 mg via INTRAVENOUS
  Filled 2024-07-22: qty 150

## 2024-07-22 MED ORDER — SODIUM CHLORIDE 0.9 % IV SOLN
150.0000 mg/m2 | Freq: Once | INTRAVENOUS | Status: AC
  Administered 2024-07-22: 300 mg via INTRAVENOUS
  Filled 2024-07-22: qty 15

## 2024-07-22 MED ORDER — SODIUM CHLORIDE 0.9 % IV SOLN: INTRAVENOUS | Status: DC

## 2024-07-22 MED ORDER — ATROPINE SULFATE 1 MG/ML IV SOLN
0.5000 mg | Freq: Once | INTRAVENOUS | Status: AC | PRN
  Administered 2024-07-22: 0.5 mg via INTRAVENOUS
  Filled 2024-07-22: qty 1

## 2024-07-22 MED ORDER — SODIUM CHLORIDE 0.9 % IV SOLN
400.0000 mg/m2 | Freq: Once | INTRAVENOUS | Status: AC
  Administered 2024-07-22: 744 mg via INTRAVENOUS
  Filled 2024-07-22: qty 37.2

## 2024-07-22 MED ORDER — ALPRAZOLAM 0.5 MG PO TABS: 0.5000 mg | ORAL_TABLET | Freq: Every day | ORAL | 1 refills | Status: DC | PRN

## 2024-07-22 NOTE — Progress Notes (Signed)
 Patient seen by Dr. Thornton Papas today  Vitals are within treatment parameters:Yes   Labs are within treatment parameters: Yes   Treatment plan has been signed: Yes   Per physician team, Patient is ready for treatment and there are NO modifications to the treatment plan.

## 2024-07-22 NOTE — Progress Notes (Signed)
 [facility] OFFICE PROGRESS NOTE   Diagnosis: Pancreas cancer  INTERVAL HISTORY:   David [name] completed another cycle of chemotherapy on 07/08/2024.  No nausea/vomiting, mouth sores, or diarrhea.  Neuropathy symptoms have improved.  He had leg pain for a few days following G-CSF therapy.  He saw Dr. Wendell 07/14/2024.  Dr. Wendell recommends continuing FOLFIRI.  Objective:  Vital signs in last 24 hours:  Blood pressure 121/79, pulse 77, temperature 98.1 F (36.7 C), temperature source Temporal, resp. rate 18, height 5' 8 (1.727 m), weight 161 lb 3.2 oz (73.1 kg), SpO2 100%.    HEENT: No thrush or ulcers Resp: Lungs clear bilaterally Cardio: Regular rate and rhythm GI: No hepatosplenomegaly, no mass, nontender Vascular: No leg edema  Portacath/PICC-without erythema  Lab Results:  Lab Results  Component Value Date   WBC 6.1 07/22/2024   HGB 12.0 (L) 07/22/2024   HCT 35.3 (L) 07/22/2024   MCV 100.6 (H) 07/22/2024   PLT 160 07/22/2024   NEUTROABS 4.3 07/22/2024    CMP  Lab Results  Component Value Date   NA 139 07/22/2024   K 4.3 07/22/2024   CL 100 07/22/2024   CO2 25 07/22/2024   GLUCOSE 195 (H) 07/22/2024   BUN 13 07/22/2024   CREATININE 0.91 07/22/2024   CALCIUM  10.3 07/22/2024   PROT 7.3 07/22/2024   ALBUMIN 4.7 07/22/2024   AST 31 07/22/2024   ALT 48 (H) 07/22/2024   ALKPHOS 235 (H) 07/22/2024   BILITOT 0.8 07/22/2024   GFRNONAA >60 07/22/2024   GFRAA >60 03/26/2018    Lab Results  Component Value Date   CEA 772.45 (H) 05/02/2023   CAN199 51 (H) 07/08/2024     Medications: I have reviewed the patient's current medications.   Assessment/Plan: Pancreas cancer CT angiogram chest 04/24/2023-low-density mass in the dome of the liver with at least 3 additional lesions in both hepatic lobes, borderline lymphadenopathy in the hepatoduodenal ligament Right upper quadrant ultrasound 04/24/2023-large circumscribed mass of the liver dome CT  abdomen/pelvis 04/24/2023-multiple liver lesions poorly defined on noncontrast exam, prominent portacaval and porta hepatis nodes MRI abdomen 05/05/2023-multiple rim-hypoenhancing liver lesions consistent with hepatic metastases, suspicion for a pancreas tail mass Ultrasound-guided biopsy of dominant right liver lesion 05/10/2023-adenocarcinoma, CK7 and CDX2 positive, abundant cytoplasm and mucin with extracellular mucin, foci of lymphovascular invasion Tempus gene panel-K-ras G12V, T P53 mild tumor mutation burden 3.2, MSS PET 06/04/2023-multiple hypermetabolic liver lesions consistent with metastases, low-level FDG uptake in the soft tissue fullness at the tip of the pancreas tail, low-level FDG activity involving a small soft tissue nodule between the gastric fundus and spleen, tiny foci of accumulation at the right costovertebral junction at T10 and roof of the left acetabulum without an underlying CT lesion Elevated CEA and CA 19-9 EUS 06/07/2023 ,22x 15 mm irregular mass in the tail the pancreas, 11 mm subcarinal lymph node, FNA biopsy of the pancreas mass-suspicious for malignancy Cycle 1 FOLFIRINOX 06/19/2023 Cycle 2 FOLFIRINOX 07/03/2023 Cycle 3 FOLFIRINOX 07/17/2023 Cycle 4 FOLFIRINOX 07/31/2023 Cycle 5 FOLFIRINOX 08/14/2023, oxaliplatin  and Decadron  dose reduced Cycle 6 FOLFIRINOX 08/28/2023 Cycle 7 FOLFIRINOX 09/11/2023 CTs at [facility] 09/16/2023, compared to 04/24/2023-unchanged pancreas tail mass, decrease in attenuation of hepatic metastases, several lesions have decreased in size, no new lesions Cycle 8 FOLFIRINOX 09/25/2023 Cycle 9 FOLFIRINOX 10/09/2023 Cycle 10 FOLFIRINOX 10/22/2023 Cycle 11 FOLFIRINOX 11/05/2023 Cycle 12 FOLFIRINOX 11/20/2023 Cycle 13 FOLFIRINOX 12/04/2023 Cycle 14 FOLFIRINOX 12/19/2023 CTs at [facility] 12/23/2023: Stable to slight decrease in hepatic metastases,  no new lesions, stable pancreas tail mass MRI liver at [facility] 12/26/2023: Multifocal nonenhancing hepatic metastases, pancreas tail  mass not well-visualized Cycle 15 FOLFIRINOX 01/01/2024, 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 Cycle 16 FOLFIRINOX 01/15/2024, 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 Cycle 17 FOLFIRINOX 02/05/2024, 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 Cycle 18 FOLFIRINOX 02/19/2024, 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 Cycle 19 FOLFIRINOX 03/04/2024, 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 Cycle 20 FOLFIRINOX 03/18/2024, 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 CTs at [facility] 03/27/2024: Stable hepatic metastases, no new hepatic lesion, pancreas tail lesion not well-visualized unchanged right lower lobe groundglass opacity Cycle 21 FOLFIRINOX 04/01/2024, 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 Cycle 22 FOLFIRINOX 04/15/2024, 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 Cycle 23 FOLFIRINOX 04/29/2024, 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 Cycle 24 FOLFIRINOX 05/20/2024, 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 Cycle 25 FOLFIRINOX 06/11/2024, 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 Cycle 26 FOLFIRINOX 06/25/2024, 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 Cycle 27 FOLFIRINOX 07/08/2024, 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 CTs at [facility] 07/14/2024: Stable treated hepatic metastatic disease, pancreas tail lesion not visualized Cycle 28 FOLFIRINOX 07/22/2024, 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  Resolved with Reglan .  Recurrent hiccups following cycle 4 FOLFIRINOX-not relieved with Reglan         Disposition: David [name] appears stable.  The restaging CTs 07/14/2024 reveal stable treated disease in the liver.  No evidence of disease progression.  The plan is to continue FOLFIRI.  He will complete another cycle today.  He will return for an office visit and chemotherapy in 2 weeks.  The CA 19-9 has stabilized over the past few months.  We will follow-up on the CA 19-9  from today.  Arley Hof, MD  07/22/2024  10:05 AM

## 2024-07-22 NOTE — Patient Instructions (Signed)
 [facility] - A DEPT OF [facility]  Discharge Instructions: Thank you for choosing [facility] to provide your oncology and hematology care.   If you have a lab appointment with the Cancer Center, please go directly to the Cancer Center and check in at the registration area.   Wear comfortable clothing and clothing appropriate for easy access to any Portacath or PICC line.   We strive to give you quality time with your provider. You may need to reschedule your appointment if you arrive late (15 or more minutes).  Arriving late affects you and other patients whose appointments are after yours.  Also, if you miss three or more appointments without notifying the office, you may be dismissed from the clinic at the provider's discretion.      For prescription refill requests, have your pharmacy contact our office and allow 72 hours for refills to be completed.    Today you received the following chemotherapy and/or immunotherapy agents: leucovorin , irinotecan , fluorouracil        To help prevent nausea and vomiting after your treatment, we encourage you to take your nausea medication as directed.  BELOW ARE SYMPTOMS THAT SHOULD BE REPORTED IMMEDIATELY: *FEVER GREATER THAN 100.4 F (38 C) OR HIGHER *CHILLS OR SWEATING *NAUSEA AND VOMITING THAT IS NOT CONTROLLED WITH YOUR NAUSEA MEDICATION *UNUSUAL SHORTNESS OF BREATH *UNUSUAL BRUISING OR BLEEDING *URINARY PROBLEMS (pain or burning when urinating, or frequent urination) *BOWEL PROBLEMS (unusual diarrhea, constipation, pain near the anus) TENDERNESS IN MOUTH AND THROAT WITH OR WITHOUT PRESENCE OF ULCERS (sore throat, sores in mouth, or a toothache) UNUSUAL RASH, SWELLING OR PAIN  UNUSUAL VAGINAL DISCHARGE OR ITCHING   Items with * indicate a potential emergency and should be followed up as soon as possible or go to the Emergency Department if any problems should occur.  Please show the CHEMOTHERAPY  ALERT CARD or IMMUNOTHERAPY ALERT CARD at check-in to the Emergency Department and triage nurse.  Should you have questions after your visit or need to cancel or reschedule your appointment, please contact [facility] - A DEPT OF [facility]  Dept: [phone]  and follow the prompts.  Office hours are 8:00 a.m. to 4:30 p.m. Monday - Friday. Please note that voicemails left after 4:00 p.m. may not be returned until the following business day.  We are closed weekends and major holidays. You have access to a nurse at all times for urgent questions. Please call the main number to the clinic Dept: [phone] and follow the prompts.   For any non-urgent questions, you may also contact your provider using MyChart. We now offer e-Visits for anyone 29 and older to request care online for non-urgent symptoms. For details visit [email or web address].   Also download the MyChart app! Go to the app store, search MyChart, open the app, select [place], and log in with your MyChart username and password.

## 2024-07-23 ENCOUNTER — Encounter: Payer: Self-pay | Admitting: Oncology

## 2024-07-23 ENCOUNTER — Other Ambulatory Visit: Payer: Self-pay

## 2024-07-23 LAB — CANCER ANTIGEN 19-9: CA 19-9: 50 U/mL — ABNORMAL HIGH (ref 0–35)

## 2024-07-24 ENCOUNTER — Ambulatory Visit

## 2024-07-24 ENCOUNTER — Other Ambulatory Visit: Payer: Self-pay | Admitting: *Deleted

## 2024-07-24 VITALS — BP 127/76 | HR 79 | Temp 98.6°F | Resp 18

## 2024-07-24 DIAGNOSIS — C259 Malignant neoplasm of pancreas, unspecified: Secondary | ICD-10-CM

## 2024-07-24 DIAGNOSIS — Z5111 Encounter for antineoplastic chemotherapy: Secondary | ICD-10-CM | POA: Diagnosis not present

## 2024-07-24 MED ORDER — ALPRAZOLAM 0.5 MG PO TABS: 0.5000 mg | ORAL_TABLET | Freq: Every evening | ORAL | 1 refills | Status: DC | PRN

## 2024-07-24 MED ORDER — PEGFILGRASTIM-JMDB 6 MG/0.6ML ~~LOC~~ SOSY
6.0000 mg | PREFILLED_SYRINGE | Freq: Once | SUBCUTANEOUS | Status: AC
  Administered 2024-07-24: 6 mg via SUBCUTANEOUS
  Filled 2024-07-24: qty 0.6

## 2024-07-24 NOTE — Patient Instructions (Signed)

## 2024-08-02 ENCOUNTER — Other Ambulatory Visit: Payer: Self-pay | Admitting: Oncology

## 2024-08-04 MED ORDER — SODIUM CHLORIDE 0.9 % IV SOLN
2400.0000 mg/m2 | INTRAVENOUS | Status: DC
  Administered 2024-08-05: 4450 mg via INTRAVENOUS
  Filled 2024-08-04: qty 89

## 2024-08-05 ENCOUNTER — Encounter: Payer: Self-pay | Admitting: Nurse Practitioner

## 2024-08-05 ENCOUNTER — Inpatient Hospital Stay (HOSPITAL_BASED_OUTPATIENT_CLINIC_OR_DEPARTMENT_OTHER): Admitting: Nurse Practitioner

## 2024-08-05 ENCOUNTER — Inpatient Hospital Stay

## 2024-08-05 ENCOUNTER — Encounter: Payer: Self-pay | Admitting: Oncology

## 2024-08-05 ENCOUNTER — Other Ambulatory Visit: Payer: Self-pay | Admitting: *Deleted

## 2024-08-05 VITALS — BP 127/85 | HR 83 | Temp 98.2°F | Resp 18 | Ht 68.0 in | Wt 162.1 lb

## 2024-08-05 DIAGNOSIS — C259 Malignant neoplasm of pancreas, unspecified: Secondary | ICD-10-CM

## 2024-08-05 DIAGNOSIS — C787 Secondary malignant neoplasm of liver and intrahepatic bile duct: Secondary | ICD-10-CM | POA: Diagnosis not present

## 2024-08-05 DIAGNOSIS — Z5111 Encounter for antineoplastic chemotherapy: Secondary | ICD-10-CM | POA: Diagnosis not present

## 2024-08-05 LAB — CBC WITH DIFFERENTIAL (CANCER CENTER ONLY)
Abs Immature Granulocytes: 0.06 K/uL (ref 0.00–0.07)
Basophils Absolute: 0 K/uL (ref 0.0–0.1)
Basophils Relative: 1 %
Eosinophils Absolute: 0.1 K/uL (ref 0.0–0.5)
Eosinophils Relative: 1 %
HCT: 34.9 % — ABNORMAL LOW (ref 39.0–52.0)
Hemoglobin: 11.8 g/dL — ABNORMAL LOW (ref 13.0–17.0)
Immature Granulocytes: 1 %
Lymphocytes Relative: 14 %
Lymphs Abs: 0.9 K/uL (ref 0.7–4.0)
MCH: 34.1 pg — ABNORMAL HIGH (ref 26.0–34.0)
MCHC: 33.8 g/dL (ref 30.0–36.0)
MCV: 100.9 fL — ABNORMAL HIGH (ref 80.0–100.0)
Monocytes Absolute: 0.6 K/uL (ref 0.1–1.0)
Monocytes Relative: 9 %
Neutro Abs: 4.7 K/uL (ref 1.7–7.7)
Neutrophils Relative %: 74 %
Platelet Count: 130 K/uL — ABNORMAL LOW (ref 150–400)
RBC: 3.46 MIL/uL — ABNORMAL LOW (ref 4.22–5.81)
RDW: 13.4 % (ref 11.5–15.5)
WBC Count: 6.3 K/uL (ref 4.0–10.5)
nRBC: 0 % (ref 0.0–0.2)

## 2024-08-05 LAB — CMP (CANCER CENTER ONLY)
ALT: 48 U/L — ABNORMAL HIGH (ref 0–44)
AST: 30 U/L (ref 15–41)
Albumin: 4.4 g/dL (ref 3.5–5.0)
Alkaline Phosphatase: 226 U/L — ABNORMAL HIGH (ref 38–126)
Anion gap: 15 (ref 5–15)
BUN: 12 mg/dL (ref 6–20)
CO2: 22 mmol/L (ref 22–32)
Calcium: 9.9 mg/dL (ref 8.9–10.3)
Chloride: 101 mmol/L (ref 98–111)
Creatinine: 0.84 mg/dL (ref 0.61–1.24)
GFR, Estimated: >60 mL/min (ref >60)
Glucose, Bld: 204 mg/dL — ABNORMAL HIGH (ref 70–99)
Potassium: 4.2 mmol/L (ref 3.5–5.1)
Sodium: 138 mmol/L (ref 135–145)
Total Bilirubin: 0.8 mg/dL (ref 0.0–1.2)
Total Protein: 7.1 g/dL (ref 6.5–8.1)

## 2024-08-05 LAB — VITAMIN B12: Vitamin B-12: 2447 pg/mL — ABNORMAL HIGH (ref 180–914)

## 2024-08-05 MED ORDER — SODIUM CHLORIDE 0.9 % IV SOLN: INTRAVENOUS | Status: DC

## 2024-08-05 MED ORDER — SODIUM CHLORIDE 0.9 % IV SOLN
400.0000 mg/m2 | Freq: Once | INTRAVENOUS | Status: AC
  Administered 2024-08-05: 744 mg via INTRAVENOUS
  Filled 2024-08-05: qty 37.2

## 2024-08-05 MED ORDER — ATROPINE SULFATE 1 MG/ML IV SOLN
0.5000 mg | Freq: Once | INTRAVENOUS | Status: AC | PRN
  Administered 2024-08-05: 0.5 mg via INTRAVENOUS
  Filled 2024-08-05: qty 1

## 2024-08-05 MED ORDER — DEXAMETHASONE SODIUM PHOSPHATE 10 MG/ML IJ SOLN
5.0000 mg | Freq: Once | INTRAMUSCULAR | Status: AC
  Administered 2024-08-05: 5 mg via INTRAVENOUS
  Filled 2024-08-05: qty 1

## 2024-08-05 MED ORDER — SODIUM CHLORIDE 0.9 % IV SOLN
150.0000 mg/m2 | Freq: Once | INTRAVENOUS | Status: AC
  Administered 2024-08-05: 300 mg via INTRAVENOUS
  Filled 2024-08-05: qty 15

## 2024-08-05 MED ORDER — SODIUM CHLORIDE 0.9 % IV SOLN
150.0000 mg | Freq: Once | INTRAVENOUS | Status: AC
  Administered 2024-08-05: 150 mg via INTRAVENOUS
  Filled 2024-08-05: qty 150

## 2024-08-05 MED ORDER — PALONOSETRON HCL INJECTION 0.25 MG/5ML
0.2500 mg | Freq: Once | INTRAVENOUS | Status: AC
  Administered 2024-08-05: 0.25 mg via INTRAVENOUS
  Filled 2024-08-05: qty 5

## 2024-08-05 NOTE — Patient Instructions (Signed)
 [facility] - A DEPT OF [facility]  Discharge Instructions: Thank you for choosing [facility] to provide your oncology and hematology care.   If you have a lab appointment with the Cancer Center, please go directly to the Cancer Center and check in at the registration area.   Wear comfortable clothing and clothing appropriate for easy access to any Portacath or PICC line.   We strive to give you quality time with your provider. You may need to reschedule your appointment if you arrive late (15 or more minutes).  Arriving late affects you and other patients whose appointments are after yours.  Also, if you miss three or more appointments without notifying the office, you may be dismissed from the clinic at the provider's discretion.      For prescription refill requests, have your pharmacy contact our office and allow 72 hours for refills to be completed.    Today you received the following chemotherapy and/or immunotherapy agents: leucovorin , irinotecan , fluorouracil        To help prevent nausea and vomiting after your treatment, we encourage you to take your nausea medication as directed.  BELOW ARE SYMPTOMS THAT SHOULD BE REPORTED IMMEDIATELY: *FEVER GREATER THAN 100.4 F (38 C) OR HIGHER *CHILLS OR SWEATING *NAUSEA AND VOMITING THAT IS NOT CONTROLLED WITH YOUR NAUSEA MEDICATION *UNUSUAL SHORTNESS OF BREATH *UNUSUAL BRUISING OR BLEEDING *URINARY PROBLEMS (pain or burning when urinating, or frequent urination) *BOWEL PROBLEMS (unusual diarrhea, constipation, pain near the anus) TENDERNESS IN MOUTH AND THROAT WITH OR WITHOUT PRESENCE OF ULCERS (sore throat, sores in mouth, or a toothache) UNUSUAL RASH, SWELLING OR PAIN  UNUSUAL VAGINAL DISCHARGE OR ITCHING   Items with * indicate a potential emergency and should be followed up as soon as possible or go to the Emergency Department if any problems should occur.  Please show the CHEMOTHERAPY  ALERT CARD or IMMUNOTHERAPY ALERT CARD at check-in to the Emergency Department and triage nurse.  Should you have questions after your visit or need to cancel or reschedule your appointment, please contact [facility] - A DEPT OF [facility]  Dept: [phone]  and follow the prompts.  Office hours are 8:00 a.m. to 4:30 p.m. Monday - Friday. Please note that voicemails left after 4:00 p.m. may not be returned until the following business day.  We are closed weekends and major holidays. You have access to a nurse at all times for urgent questions. Please call the main number to the clinic Dept: [phone] and follow the prompts.   For any non-urgent questions, you may also contact your provider using MyChart. We now offer e-Visits for anyone 29 and older to request care online for non-urgent symptoms. For details visit [email or web address].   Also download the MyChart app! Go to the app store, search MyChart, open the app, select [place], and log in with your MyChart username and password.

## 2024-08-05 NOTE — Patient Instructions (Signed)

## 2024-08-05 NOTE — Progress Notes (Signed)
 [facility] OFFICE PROGRESS NOTE   Diagnosis: Pancreas cancer  INTERVAL HISTORY:   Mr. Sittner returns as scheduled.  He completed another cycle of chemotherapy 07/22/2024.  He did not experience hiccups.  No nausea vomiting.  No mouth sores.  He has periodic diarrhea which he feels is metformin related.  No abdominal pain.  He has a good appetite.  He noted more fatigue.  Objective:  Vital signs in last 24 hours:  Blood pressure 127/85, pulse 83, temperature 98.2 F (36.8 C), temperature source Temporal, resp. rate 18, height 5' 8 (1.727 m), weight 162 lb 1.6 oz (73.5 kg), SpO2 100%.    HEENT: No thrush or ulcers. Resp: Lungs clear bilaterally. Cardio: Regular rate and rhythm. GI: No hepatosplenomegaly.  Nontender. Vascular: No leg edema. Skin: Palms without erythema. Port-A-Cath without erythema.  Lab Results:  Lab Results  Component Value Date   WBC 6.3 08/05/2024   HGB 11.8 (L) 08/05/2024   HCT 34.9 (L) 08/05/2024   MCV 100.9 (H) 08/05/2024   PLT 130 (L) 08/05/2024   NEUTROABS 4.7 08/05/2024    Imaging:  No results found.  Medications: I have reviewed the patient's current medications.  Assessment/Plan: Pancreas cancer CT angiogram chest 04/24/2023-low-density mass in the dome of the liver with at least 3 additional lesions in both hepatic lobes, borderline lymphadenopathy in the hepatoduodenal ligament Right upper quadrant ultrasound 04/24/2023-large circumscribed mass of the liver dome CT abdomen/pelvis 04/24/2023-multiple liver lesions poorly defined on noncontrast exam, prominent portacaval and porta hepatis nodes MRI abdomen 05/05/2023-multiple rim-hypoenhancing liver lesions consistent with hepatic metastases, suspicion for a pancreas tail mass Ultrasound-guided biopsy of dominant right liver lesion 05/10/2023-adenocarcinoma, CK7 and CDX2 positive, abundant cytoplasm and mucin with extracellular mucin, foci of lymphovascular invasion Tempus gene  panel-K-ras G12V, T P53 mild tumor mutation burden 3.2, MSS PET 06/04/2023-multiple hypermetabolic liver lesions consistent with metastases, low-level FDG uptake in the soft tissue fullness at the tip of the pancreas tail, low-level FDG activity involving a small soft tissue nodule between the gastric fundus and spleen, tiny foci of accumulation at the right costovertebral junction at T10 and roof of the left acetabulum without an underlying CT lesion Elevated CEA and CA 19-9 EUS 06/07/2023 ,22x 15 mm irregular mass in the tail the pancreas, 11 mm subcarinal lymph node, FNA biopsy of the pancreas mass-suspicious for malignancy Cycle 1 FOLFIRINOX 06/19/2023 Cycle 2 FOLFIRINOX 07/03/2023 Cycle 3 FOLFIRINOX 07/17/2023 Cycle 4 FOLFIRINOX 07/31/2023 Cycle 5 FOLFIRINOX 08/14/2023, oxaliplatin  and Decadron  dose reduced Cycle 6 FOLFIRINOX 08/28/2023 Cycle 7 FOLFIRINOX 09/11/2023 CT[facility] at [facility] 09/16/2023, compared to 04/24/2023-unchanged pancreas tail mass, decrease in attenuation of hepatic metastases, several lesions have decreased in size, no new lesions Cycle 8 FOLFIRINOX 09/25/2023 Cycle 9 FOLFIRINOX 10/09/2023 Cycle 10 FOLFIRINOX 10/22/2023 Cycle 11 FOLFIRINOX 11/05/2023 Cycle 12 FOLFIRINOX 11/20/2023 Cycle 13 FOLFIRINOX 12/04/2023 Cycle 14 FOLFIRINOX 12/19/2023 CTs at [facility] 12/23/2023: Stable to slight decrease in hepatic metastases, no new lesions, stable pancreas tail mass MRI liver at [facility] 12/26/2023: Multifocal nonenhancing hepatic metastases, pancreas tail mass not well-visualized Cycle 15 FOLFIRINOX 01/01/2024, 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 Cycle 16 FOLFIRINOX 01/15/2024, 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 Cycle 17 FOLFIRINOX 02/05/2024, 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 Cycle 18 FOLFIRINOX 02/19/2024, 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 Cycle 19 FOLFIRINOX 03/04/2024, 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 Cycle 20 FOLFIRINOX 03/18/2024, 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 CTs at  [facility] 03/27/2024: Stable hepatic metastases, no new hepatic lesion, pancreas tail lesion not well-visualized unchanged right lower lobe groundglass opacity Cycle  21 FOLFIRINOX 04/01/2024, 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 Cycle 22 FOLFIRINOX 04/15/2024, 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 Cycle 23 FOLFIRINOX 04/29/2024, 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 Cycle 24 FOLFIRINOX 05/20/2024, 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 Cycle 25 FOLFIRINOX 06/11/2024, 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 Cycle 26 FOLFIRINOX 06/25/2024, 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 Cycle 27 FOLFIRINOX 07/08/2024, 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 CTs at [facility] 07/14/2024: Stable treated hepatic metastatic disease, pancreas tail lesion not visualized Cycle 28 FOLFIRINOX 07/22/2024, 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 Cycle 29 FOLFIRINOX 08/05/2024, 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  Resolved with Reglan .  Recurrent hiccups following cycle 4 FOLFIRINOX-not relieved with Reglan     Disposition: David [name] appears stable.  He has completed 28 cycles of FOLFIRINOX.  Oxaliplatin  is on hold due to neuropathy.  He is tolerating treatment well.  There is no clinical evidence of disease progression.  Plan to proceed with another cycle today, continue to hold oxaliplatin .  CBC and chemistry panel reviewed.  Labs adequate for treatment.  He will return for follow-up and the next cycle of chemotherapy in 2 weeks.  He will contact the office in the interim with any problems.    Olam Ned ANP/GNP-BC   08/05/2024  10:45 AM

## 2024-08-05 NOTE — Progress Notes (Signed)
 Patient seen by Olam Ned NP today  Vitals are within treatment parameters:Yes   Labs are within treatment parameters: Yes   Treatment plan has been signed: Yes   Per physician team, Patient is ready for treatment and there are NO modifications to the treatment plan.

## 2024-08-06 ENCOUNTER — Other Ambulatory Visit: Payer: Self-pay

## 2024-08-06 ENCOUNTER — Encounter: Payer: Self-pay | Admitting: Nurse Practitioner

## 2024-08-06 LAB — CANCER ANTIGEN 19-9: CA 19-9: 52 U/mL — ABNORMAL HIGH (ref 0–35)

## 2024-08-07 ENCOUNTER — Inpatient Hospital Stay

## 2024-08-07 ENCOUNTER — Other Ambulatory Visit: Payer: Self-pay

## 2024-08-07 VITALS — BP 119/75 | HR 81 | Temp 97.6°F | Resp 18

## 2024-08-07 DIAGNOSIS — C259 Malignant neoplasm of pancreas, unspecified: Secondary | ICD-10-CM

## 2024-08-07 DIAGNOSIS — Z5111 Encounter for antineoplastic chemotherapy: Secondary | ICD-10-CM | POA: Diagnosis not present

## 2024-08-07 MED ORDER — PEGFILGRASTIM-JMDB 6 MG/0.6ML ~~LOC~~ SOSY
6.0000 mg | PREFILLED_SYRINGE | Freq: Once | SUBCUTANEOUS | Status: AC
  Administered 2024-08-07: 6 mg via SUBCUTANEOUS
  Filled 2024-08-07: qty 0.6

## 2024-08-15 ENCOUNTER — Other Ambulatory Visit: Payer: Self-pay | Admitting: Oncology

## 2024-08-19 ENCOUNTER — Encounter: Payer: Self-pay | Admitting: Nurse Practitioner

## 2024-08-19 ENCOUNTER — Inpatient Hospital Stay

## 2024-08-19 ENCOUNTER — Inpatient Hospital Stay: Admitting: Nurse Practitioner

## 2024-08-19 ENCOUNTER — Inpatient Hospital Stay: Attending: Hematology

## 2024-08-19 VITALS — BP 115/74 | HR 89 | Temp 98.0°F | Resp 18 | Ht 68.0 in | Wt 159.8 lb

## 2024-08-19 DIAGNOSIS — G629 Polyneuropathy, unspecified: Secondary | ICD-10-CM | POA: Insufficient documentation

## 2024-08-19 DIAGNOSIS — Z7984 Long term (current) use of oral hypoglycemic drugs: Secondary | ICD-10-CM | POA: Insufficient documentation

## 2024-08-19 DIAGNOSIS — C787 Secondary malignant neoplasm of liver and intrahepatic bile duct: Secondary | ICD-10-CM

## 2024-08-19 DIAGNOSIS — Z800 Family history of malignant neoplasm of digestive organs: Secondary | ICD-10-CM | POA: Diagnosis not present

## 2024-08-19 DIAGNOSIS — C252 Malignant neoplasm of tail of pancreas: Secondary | ICD-10-CM | POA: Insufficient documentation

## 2024-08-19 DIAGNOSIS — C259 Malignant neoplasm of pancreas, unspecified: Secondary | ICD-10-CM

## 2024-08-19 DIAGNOSIS — Z5111 Encounter for antineoplastic chemotherapy: Secondary | ICD-10-CM | POA: Diagnosis present

## 2024-08-19 DIAGNOSIS — Z5189 Encounter for other specified aftercare: Secondary | ICD-10-CM | POA: Diagnosis not present

## 2024-08-19 DIAGNOSIS — R197 Diarrhea, unspecified: Secondary | ICD-10-CM | POA: Insufficient documentation

## 2024-08-19 LAB — CMP (CANCER CENTER ONLY)
ALT: 39 U/L (ref 0–44)
AST: 33 U/L (ref 15–41)
Albumin: 4.4 g/dL (ref 3.5–5.0)
Alkaline Phosphatase: 221 U/L — ABNORMAL HIGH (ref 38–126)
Anion gap: 13 (ref 5–15)
BUN: 13 mg/dL (ref 6–20)
CO2: 21 mmol/L — ABNORMAL LOW (ref 22–32)
Calcium: 9.9 mg/dL (ref 8.9–10.3)
Chloride: 101 mmol/L (ref 98–111)
Creatinine: 0.82 mg/dL (ref 0.61–1.24)
GFR, Estimated: >60 mL/min (ref >60)
Glucose, Bld: 174 mg/dL — ABNORMAL HIGH (ref 70–99)
Potassium: 4.3 mmol/L (ref 3.5–5.1)
Sodium: 135 mmol/L (ref 135–145)
Total Bilirubin: 0.5 mg/dL (ref 0.0–1.2)
Total Protein: 7.2 g/dL (ref 6.5–8.1)

## 2024-08-19 LAB — CBC WITH DIFFERENTIAL (CANCER CENTER ONLY)
Abs Immature Granulocytes: 0.17 K/uL — ABNORMAL HIGH (ref 0.00–0.07)
Basophils Absolute: 0.1 K/uL (ref 0.0–0.1)
Basophils Relative: 1 %
Eosinophils Absolute: 0.1 K/uL (ref 0.0–0.5)
Eosinophils Relative: 2 %
HCT: 35.4 % — ABNORMAL LOW (ref 39.0–52.0)
Hemoglobin: 11.9 g/dL — ABNORMAL LOW (ref 13.0–17.0)
Immature Granulocytes: 2 %
Lymphocytes Relative: 15 %
Lymphs Abs: 1.1 K/uL (ref 0.7–4.0)
MCH: 33.4 pg (ref 26.0–34.0)
MCHC: 33.6 g/dL (ref 30.0–36.0)
MCV: 99.4 fL (ref 80.0–100.0)
Monocytes Absolute: 0.5 K/uL (ref 0.1–1.0)
Monocytes Relative: 7 %
Neutro Abs: 5.5 K/uL (ref 1.7–7.7)
Neutrophils Relative %: 73 %
Platelet Count: 197 K/uL (ref 150–400)
RBC: 3.56 MIL/uL — ABNORMAL LOW (ref 4.22–5.81)
RDW: 13.2 % (ref 11.5–15.5)
WBC Count: 7.5 K/uL (ref 4.0–10.5)
nRBC: 0 % (ref 0.0–0.2)

## 2024-08-19 MED ORDER — PALONOSETRON HCL INJECTION 0.25 MG/5ML
0.2500 mg | Freq: Once | INTRAVENOUS | Status: AC
  Administered 2024-08-19: 0.25 mg via INTRAVENOUS
  Filled 2024-08-19: qty 5

## 2024-08-19 MED ORDER — SODIUM CHLORIDE 0.9 % IV SOLN: INTRAVENOUS | Status: DC

## 2024-08-19 MED ORDER — DEXAMETHASONE SODIUM PHOSPHATE 10 MG/ML IJ SOLN
5.0000 mg | Freq: Once | INTRAMUSCULAR | Status: AC
  Administered 2024-08-19: 5 mg via INTRAVENOUS
  Filled 2024-08-19: qty 1

## 2024-08-19 MED ORDER — ATROPINE SULFATE 1 MG/ML IV SOLN
0.5000 mg | Freq: Once | INTRAVENOUS | Status: AC | PRN
  Administered 2024-08-19: 0.5 mg via INTRAVENOUS
  Filled 2024-08-19: qty 1

## 2024-08-19 MED ORDER — SODIUM CHLORIDE 0.9 % IV SOLN
150.0000 mg | Freq: Once | INTRAVENOUS | Status: AC
  Administered 2024-08-19: 150 mg via INTRAVENOUS
  Filled 2024-08-19: qty 150

## 2024-08-19 MED ORDER — SODIUM CHLORIDE 0.9 % IV SOLN
150.0000 mg/m2 | Freq: Once | INTRAVENOUS | Status: AC
  Administered 2024-08-19: 300 mg via INTRAVENOUS
  Filled 2024-08-19: qty 15

## 2024-08-19 MED ORDER — SODIUM CHLORIDE 0.9 % IV SOLN
2400.0000 mg/m2 | INTRAVENOUS | Status: DC
  Administered 2024-08-19: 4450 mg via INTRAVENOUS
  Filled 2024-08-19: qty 89

## 2024-08-19 MED ORDER — SODIUM CHLORIDE 0.9 % IV SOLN
400.0000 mg/m2 | Freq: Once | INTRAVENOUS | Status: AC
  Administered 2024-08-19: 744 mg via INTRAVENOUS
  Filled 2024-08-19: qty 37.2

## 2024-08-19 NOTE — Progress Notes (Signed)
 Patient seen by Olam Ned NP today  Vitals are within treatment parameters:Yes   Labs are within treatment parameters: Yes   Treatment plan has been signed: Yes   Per physician team, Patient is ready for treatment and there are NO modifications to the treatment plan.

## 2024-08-19 NOTE — Patient Instructions (Signed)

## 2024-08-19 NOTE — Patient Instructions (Signed)
 [facility] - A DEPT OF [facility]  Discharge Instructions: Thank you for choosing [facility] to provide your oncology and hematology care.   If you have a lab appointment with the Cancer Center, please go directly to the Cancer Center and check in at the registration area.   Wear comfortable clothing and clothing appropriate for easy access to any Portacath or PICC line.   We strive to give you quality time with your provider. You may need to reschedule your appointment if you arrive late (15 or more minutes).  Arriving late affects you and other patients whose appointments are after yours.  Also, if you miss three or more appointments without notifying the office, you may be dismissed from the clinic at the provider's discretion.      For prescription refill requests, have your pharmacy contact our office and allow 72 hours for refills to be completed.    Today you received the following chemotherapy and/or immunotherapy agents: leucovorin , irinotecan , fluorouracil        To help prevent nausea and vomiting after your treatment, we encourage you to take your nausea medication as directed.  BELOW ARE SYMPTOMS THAT SHOULD BE REPORTED IMMEDIATELY: *FEVER GREATER THAN 100.4 F (38 C) OR HIGHER *CHILLS OR SWEATING *NAUSEA AND VOMITING THAT IS NOT CONTROLLED WITH YOUR NAUSEA MEDICATION *UNUSUAL SHORTNESS OF BREATH *UNUSUAL BRUISING OR BLEEDING *URINARY PROBLEMS (pain or burning when urinating, or frequent urination) *BOWEL PROBLEMS (unusual diarrhea, constipation, pain near the anus) TENDERNESS IN MOUTH AND THROAT WITH OR WITHOUT PRESENCE OF ULCERS (sore throat, sores in mouth, or a toothache) UNUSUAL RASH, SWELLING OR PAIN  UNUSUAL VAGINAL DISCHARGE OR ITCHING   Items with * indicate a potential emergency and should be followed up as soon as possible or go to the Emergency Department if any problems should occur.  Please show the CHEMOTHERAPY  ALERT CARD or IMMUNOTHERAPY ALERT CARD at check-in to the Emergency Department and triage nurse.  Should you have questions after your visit or need to cancel or reschedule your appointment, please contact [facility] - A DEPT OF [facility]  Dept: [phone]  and follow the prompts.  Office hours are 8:00 a.m. to 4:30 p.m. Monday - Friday. Please note that voicemails left after 4:00 p.m. may not be returned until the following business day.  We are closed weekends and major holidays. You have access to a nurse at all times for urgent questions. Please call the main number to the clinic Dept: [phone] and follow the prompts.   For any non-urgent questions, you may also contact your provider using MyChart. We now offer e-Visits for anyone 29 and older to request care online for non-urgent symptoms. For details visit [email or web address].   Also download the MyChart app! Go to the app store, search MyChart, open the app, select [place], and log in with your MyChart username and password.

## 2024-08-19 NOTE — Progress Notes (Signed)
 [facility] OFFICE PROGRESS NOTE   Diagnosis: Pancreas cancer  INTERVAL HISTORY:   David [name] returns as scheduled.  He completed another cycle of chemotherapy 08/05/2024.  He had no hiccups.  Stable neuropathy symptoms in the feet.  No nausea or vomiting.  No mouth sores.  Some diarrhea which he attributes to metformin.  Objective:  Vital signs in last 24 hours:  Blood pressure 115/74, pulse 89, temperature 98 F (36.7 C), temperature source Temporal, resp. rate 18, height 5' 8 (1.727 m), weight 159 lb 12.8 oz (72.5 kg), SpO2 100%.    HEENT: No thrush or ulcers. Resp: Lungs clear bilaterally Cardio: Regular rate and rhythm. GI: Prostatomegaly.  Nontender. Vascular: No leg edema. Skin: Palms without erythema. Port-A-Cath without erythema.  Lab Results:  Lab Results  Component Value Date   WBC 7.5 08/19/2024   HGB 11.9 (L) 08/19/2024   HCT 35.4 (L) 08/19/2024   MCV 99.4 08/19/2024   PLT 197 08/19/2024   NEUTROABS 5.5 08/19/2024    Imaging:  No results found.  Medications: I have reviewed the patient's current medications.  Assessment/Plan: Pancreas cancer CT angiogram chest 04/24/2023-low-density mass in the dome of the liver with at least 3 additional lesions in both hepatic lobes, borderline lymphadenopathy in the hepatoduodenal ligament Right upper quadrant ultrasound 04/24/2023-large circumscribed mass of the liver dome CT abdomen/pelvis 04/24/2023-multiple liver lesions poorly defined on noncontrast exam, prominent portacaval and porta hepatis nodes MRI abdomen 05/05/2023-multiple rim-hypoenhancing liver lesions consistent with hepatic metastases, suspicion for a pancreas tail mass Ultrasound-guided biopsy of dominant right liver lesion 05/10/2023-adenocarcinoma, CK7 and CDX2 positive, abundant cytoplasm and mucin with extracellular mucin, foci of lymphovascular invasion Tempus gene panel-K-ras G12V, T P53 mild tumor mutation burden 3.2, MSS PET  06/04/2023-multiple hypermetabolic liver lesions consistent with metastases, low-level FDG uptake in the soft tissue fullness at the tip of the pancreas tail, low-level FDG activity involving a small soft tissue nodule between the gastric fundus and spleen, tiny foci of accumulation at the right costovertebral junction at T10 and roof of the left acetabulum without an underlying CT lesion Elevated CEA and CA 19-9 EUS 06/07/2023 ,22x 15 mm irregular mass in the tail the pancreas, 11 mm subcarinal lymph node, FNA biopsy of the pancreas mass-suspicious for malignancy Cycle 1 FOLFIRINOX 06/19/2023 Cycle 2 FOLFIRINOX 07/03/2023 Cycle 3 FOLFIRINOX 07/17/2023 Cycle 4 FOLFIRINOX 07/31/2023 Cycle 5 FOLFIRINOX 08/14/2023, oxaliplatin  and Decadron  dose reduced Cycle 6 FOLFIRINOX 08/28/2023 Cycle 7 FOLFIRINOX 09/11/2023 CTs at [facility] 09/16/2023, compared to 04/24/2023-unchanged pancreas tail mass, decrease in attenuation of hepatic metastases, several lesions have decreased in size, no new lesions Cycle 8 FOLFIRINOX 09/25/2023 Cycle 9 FOLFIRINOX 10/09/2023 Cycle 10 FOLFIRINOX 10/22/2023 Cycle 11 FOLFIRINOX 11/05/2023 Cycle 12 FOLFIRINOX 11/20/2023 Cycle 13 FOLFIRINOX 12/04/2023 Cycle 14 FOLFIRINOX 12/19/2023 CTs at [facility] 12/23/2023: Stable to slight decrease in hepatic metastases, no new lesions, stable pancreas tail mass MRI liver at [facility] 12/26/2023: Multifocal nonenhancing hepatic metastases, pancreas tail mass not well-visualized Cycle 15 FOLFIRINOX 01/01/2024, 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 Cycle 16 FOLFIRINOX 01/15/2024, 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 Cycle 17 FOLFIRINOX 02/05/2024, 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 Cycle 18 FOLFIRINOX 02/19/2024, 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 Cycle 19 FOLFIRINOX 03/04/2024, 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 Cycle 20 FOLFIRINOX 03/18/2024, 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 CTs at [facility] 03/27/2024: Stable hepatic metastases, no new hepatic lesion,  pancreas tail lesion not well-visualized unchanged right lower lobe groundglass opacity Cycle 21 FOLFIRINOX 04/01/2024, 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 Cycle 22 FOLFIRINOX 04/15/2024, oxaliplatin  held  secondary to neuropathy Cycle 23 FOLFIRINOX 04/29/2024, 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 Cycle 24 FOLFIRINOX 05/20/2024, 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 Cycle 25 FOLFIRINOX 06/11/2024, 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 Cycle 26 FOLFIRINOX 06/25/2024, 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 Cycle 27 FOLFIRINOX 07/08/2024, 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 CTs at [facility] 07/14/2024: Stable treated hepatic metastatic disease, pancreas tail lesion not visualized Cycle 28 FOLFIRINOX 07/22/2024, 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 Cycle 29 FOLFIRINOX 08/05/2024, 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 Cycle 30 FOLFIRINOX 08/19/2024, 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  Resolved with Reglan .  Recurrent hiccups following cycle 4 FOLFIRINOX-not relieved with Reglan     Disposition: David [name] appears stable.  He has completed 29 cycles of FOLFIRINOX.  Oxaliplatin  has been on hold since cycle 15 due to neuropathy.  Neuropathy symptoms largely unchanged.  He is tolerating current treatment well.  There is no clinical evidence of disease progression.  Plan to proceed with another cycle today, continue to hold oxaliplatin .  CBC reviewed.  Counts adequate for treatment.  He will return for follow-up and treatment in 2 weeks.  We are available to see him sooner if needed.    David [name] ANP/GNP-BC   08/19/2024  9:41 AM

## 2024-08-21 ENCOUNTER — Inpatient Hospital Stay

## 2024-08-21 VITALS — BP 134/82 | HR 91 | Temp 98.8°F | Resp 18

## 2024-08-21 DIAGNOSIS — C259 Malignant neoplasm of pancreas, unspecified: Secondary | ICD-10-CM

## 2024-08-21 DIAGNOSIS — Z5111 Encounter for antineoplastic chemotherapy: Secondary | ICD-10-CM | POA: Diagnosis not present

## 2024-08-21 LAB — CANCER ANTIGEN 19-9: CA 19-9: 52 U/mL — ABNORMAL HIGH (ref 0–35)

## 2024-08-21 MED ORDER — PEGFILGRASTIM-JMDB 6 MG/0.6ML ~~LOC~~ SOSY
6.0000 mg | PREFILLED_SYRINGE | Freq: Once | SUBCUTANEOUS | Status: AC
  Administered 2024-08-21: 6 mg via SUBCUTANEOUS
  Filled 2024-08-21: qty 0.6

## 2024-08-21 NOTE — Patient Instructions (Signed)

## 2024-08-22 ENCOUNTER — Other Ambulatory Visit: Payer: Self-pay

## 2024-08-30 ENCOUNTER — Other Ambulatory Visit: Payer: Self-pay | Admitting: Oncology

## 2024-08-31 ENCOUNTER — Encounter: Payer: Self-pay | Admitting: Hematology

## 2024-09-02 ENCOUNTER — Ambulatory Visit

## 2024-09-02 ENCOUNTER — Inpatient Hospital Stay

## 2024-09-02 ENCOUNTER — Inpatient Hospital Stay (HOSPITAL_BASED_OUTPATIENT_CLINIC_OR_DEPARTMENT_OTHER): Admitting: Oncology

## 2024-09-02 ENCOUNTER — Other Ambulatory Visit

## 2024-09-02 ENCOUNTER — Ambulatory Visit: Admitting: Nurse Practitioner

## 2024-09-02 VITALS — BP 135/78 | HR 82 | Temp 97.9°F | Resp 18 | Ht 68.0 in | Wt 160.1 lb

## 2024-09-02 VITALS — BP 141/74 | HR 69 | Temp 97.9°F | Resp 18

## 2024-09-02 DIAGNOSIS — C259 Malignant neoplasm of pancreas, unspecified: Secondary | ICD-10-CM

## 2024-09-02 DIAGNOSIS — C787 Secondary malignant neoplasm of liver and intrahepatic bile duct: Secondary | ICD-10-CM

## 2024-09-02 DIAGNOSIS — Z5111 Encounter for antineoplastic chemotherapy: Secondary | ICD-10-CM | POA: Diagnosis not present

## 2024-09-02 LAB — CMP (CANCER CENTER ONLY)
ALT: 32 U/L (ref 0–44)
AST: 28 U/L (ref 15–41)
Albumin: 4.5 g/dL (ref 3.5–5.0)
Alkaline Phosphatase: 234 U/L — ABNORMAL HIGH (ref 38–126)
Anion gap: 14 (ref 5–15)
BUN: 15 mg/dL (ref 6–20)
CO2: 23 mmol/L (ref 22–32)
Calcium: 10 mg/dL (ref 8.9–10.3)
Chloride: 100 mmol/L (ref 98–111)
Creatinine: 0.89 mg/dL (ref 0.61–1.24)
GFR, Estimated: >60 mL/min (ref >60)
Glucose, Bld: 186 mg/dL — ABNORMAL HIGH (ref 70–99)
Potassium: 4.3 mmol/L (ref 3.5–5.1)
Sodium: 137 mmol/L (ref 135–145)
Total Bilirubin: 0.6 mg/dL (ref 0.0–1.2)
Total Protein: 7.2 g/dL (ref 6.5–8.1)

## 2024-09-02 LAB — CBC WITH DIFFERENTIAL (CANCER CENTER ONLY)
Abs Immature Granulocytes: 0.15 K/uL — ABNORMAL HIGH (ref 0.00–0.07)
Basophils Absolute: 0 K/uL (ref 0.0–0.1)
Basophils Relative: 0 %
Eosinophils Absolute: 0.1 K/uL (ref 0.0–0.5)
Eosinophils Relative: 2 %
HCT: 35.8 % — ABNORMAL LOW (ref 39.0–52.0)
Hemoglobin: 11.9 g/dL — ABNORMAL LOW (ref 13.0–17.0)
Immature Granulocytes: 2 %
Lymphocytes Relative: 14 %
Lymphs Abs: 1 K/uL (ref 0.7–4.0)
MCH: 33.1 pg (ref 26.0–34.0)
MCHC: 33.2 g/dL (ref 30.0–36.0)
MCV: 99.4 fL (ref 80.0–100.0)
Monocytes Absolute: 0.7 K/uL (ref 0.1–1.0)
Monocytes Relative: 10 %
Neutro Abs: 5.1 K/uL (ref 1.7–7.7)
Neutrophils Relative %: 72 %
Platelet Count: 140 K/uL — ABNORMAL LOW (ref 150–400)
RBC: 3.6 MIL/uL — ABNORMAL LOW (ref 4.22–5.81)
RDW: 13 % (ref 11.5–15.5)
WBC Count: 7.1 K/uL (ref 4.0–10.5)
nRBC: 0 % (ref 0.0–0.2)

## 2024-09-02 MED ORDER — DEXAMETHASONE SODIUM PHOSPHATE 10 MG/ML IJ SOLN
5.0000 mg | Freq: Once | INTRAMUSCULAR | Status: AC
  Administered 2024-09-02: 5 mg via INTRAVENOUS
  Filled 2024-09-02: qty 1

## 2024-09-02 MED ORDER — SODIUM CHLORIDE 0.9 % IV SOLN
2400.0000 mg/m2 | INTRAVENOUS | Status: DC
  Administered 2024-09-02: 4450 mg via INTRAVENOUS
  Filled 2024-09-02: qty 89

## 2024-09-02 MED ORDER — SODIUM CHLORIDE 0.9 % IV SOLN
400.0000 mg/m2 | Freq: Once | INTRAVENOUS | Status: AC
  Administered 2024-09-02: 744 mg via INTRAVENOUS
  Filled 2024-09-02: qty 37.2

## 2024-09-02 MED ORDER — SODIUM CHLORIDE 0.9 % IV SOLN
150.0000 mg | Freq: Once | INTRAVENOUS | Status: AC
  Administered 2024-09-02: 150 mg via INTRAVENOUS
  Filled 2024-09-02: qty 150

## 2024-09-02 MED ORDER — SODIUM CHLORIDE 0.9 % IV SOLN: INTRAVENOUS | Status: DC

## 2024-09-02 MED ORDER — PALONOSETRON HCL INJECTION 0.25 MG/5ML
0.2500 mg | Freq: Once | INTRAVENOUS | Status: AC
  Administered 2024-09-02: 0.25 mg via INTRAVENOUS
  Filled 2024-09-02: qty 5

## 2024-09-02 MED ORDER — ATROPINE SULFATE 1 MG/ML IV SOLN
0.5000 mg | Freq: Once | INTRAVENOUS | Status: AC | PRN
  Administered 2024-09-02: 0.5 mg via INTRAVENOUS
  Filled 2024-09-02: qty 1

## 2024-09-02 MED ORDER — SODIUM CHLORIDE 0.9 % IV SOLN
150.0000 mg/m2 | Freq: Once | INTRAVENOUS | Status: AC
  Administered 2024-09-02: 300 mg via INTRAVENOUS
  Filled 2024-09-02: qty 15

## 2024-09-02 NOTE — Progress Notes (Signed)
 Patient seen by Dr. Arley Hof today  Vitals are within treatment parameters:Yes   Labs are within treatment parameters: Yes   Treatment plan has been signed: Yes   Per physician team, Patient is ready for treatment and there are NO modifications to the treatment plan.

## 2024-09-02 NOTE — Progress Notes (Signed)
 [facility] OFFICE PROGRESS NOTE   Diagnosis: Pancreas cancer  INTERVAL HISTORY:   David [name] completed another cycle of chemotherapy on 08/19/2024.  No nausea/vomiting.  He has loose stool.  He relates this to metformin.  He had mild leg discomfort after receiving G-CSF.  Neuropathy symptoms have improved.  He reports the numbness in his hands has almost completely resolved.  No abdominal pain.  Objective:  Vital signs in last 24 hours:  Blood pressure 135/78, pulse 82, temperature 97.9 F (36.6 C), temperature source Temporal, resp. rate 18, height 5' 8 (1.727 m), weight 160 lb 1.6 oz (72.6 kg), SpO2 100%.    HEENT: No thrush or ulcers Resp: Lungs clear bilaterally Cardio: Regular rate and rhythm GI: No hepatosplenomegaly, nontender, no mass Vascular: No leg edema   Portacath/PICC-without erythema  Lab Results:  Lab Results  Component Value Date   WBC 7.1 09/02/2024   HGB 11.9 (L) 09/02/2024   HCT 35.8 (L) 09/02/2024   MCV 99.4 09/02/2024   PLT 140 (L) 09/02/2024   NEUTROABS 5.1 09/02/2024    CMP  Lab Results  Component Value Date   NA 135 08/19/2024   K 4.3 08/19/2024   CL 101 08/19/2024   CO2 21 (L) 08/19/2024   GLUCOSE 174 (H) 08/19/2024   BUN 13 08/19/2024   CREATININE 0.82 08/19/2024   CALCIUM  9.9 08/19/2024   PROT 7.2 08/19/2024   ALBUMIN 4.4 08/19/2024   AST 33 08/19/2024   ALT 39 08/19/2024   ALKPHOS 221 (H) 08/19/2024   BILITOT 0.5 08/19/2024   GFRNONAA >60 08/19/2024   GFRAA >60 03/26/2018    Lab Results  Component Value Date   CEA 772.45 (H) 05/02/2023   CAN199 52 (H) 08/19/2024    Lab Results  Component Value Date   INR 0.9 05/10/2023   LABPROT 12.7 05/10/2023    Imaging:  No results found.  Medications: I have reviewed the patient's current medications.   Assessment/Plan:  Pancreas cancer CT angiogram chest 04/24/2023-low-density mass in the dome of the liver with at least 3 additional lesions in both  hepatic lobes, borderline lymphadenopathy in the hepatoduodenal ligament Right upper quadrant ultrasound 04/24/2023-large circumscribed mass of the liver dome CT abdomen/pelvis 04/24/2023-multiple liver lesions poorly defined on noncontrast exam, prominent portacaval and porta hepatis nodes MRI abdomen 05/05/2023-multiple rim-hypoenhancing liver lesions consistent with hepatic metastases, suspicion for a pancreas tail mass Ultrasound-guided biopsy of dominant right liver lesion 05/10/2023-adenocarcinoma, CK7 and CDX2 positive, abundant cytoplasm and mucin with extracellular mucin, foci of lymphovascular invasion Tempus gene panel-K-ras G12V, T P53 mild tumor mutation burden 3.2, MSS PET 06/04/2023-multiple hypermetabolic liver lesions consistent with metastases, low-level FDG uptake in the soft tissue fullness at the tip of the pancreas tail, low-level FDG activity involving a small soft tissue nodule between the gastric fundus and spleen, tiny foci of accumulation at the right costovertebral junction at T10 and roof of the left acetabulum without an underlying CT lesion Elevated CEA and CA 19-9 EUS 06/07/2023 ,22x 15 mm irregular mass in the tail the pancreas, 11 mm subcarinal lymph node, FNA biopsy of the pancreas mass-suspicious for malignancy Cycle 1 FOLFIRINOX 06/19/2023 Cycle 2 FOLFIRINOX 07/03/2023 Cycle 3 FOLFIRINOX 07/17/2023 Cycle 4 FOLFIRINOX 07/31/2023 Cycle 5 FOLFIRINOX 08/14/2023, oxaliplatin  and Decadron  dose reduced Cycle 6 FOLFIRINOX 08/28/2023 Cycle 7 FOLFIRINOX 09/11/2023 CTs at [facility] 09/16/2023, compared to 04/24/2023-unchanged pancreas tail mass, decrease in attenuation of hepatic metastases, several lesions have decreased in size, no new lesions Cycle 8 FOLFIRINOX 09/25/2023 Cycle  9 FOLFIRINOX 10/09/2023 Cycle 10 FOLFIRINOX 10/22/2023 Cycle 11 FOLFIRINOX 11/05/2023 Cycle 12 FOLFIRINOX 11/20/2023 Cycle 13 FOLFIRINOX 12/04/2023 Cycle 14 FOLFIRINOX 12/19/2023 CTs at [facility] 12/23/2023: Stable to slight  decrease in hepatic metastases, no new lesions, stable pancreas tail mass MRI liver at [facility] 12/26/2023: Multifocal nonenhancing hepatic metastases, pancreas tail mass not well-visualized Cycle 15 FOLFIRINOX 01/01/2024, 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 Cycle 16 FOLFIRINOX 01/15/2024, 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 Cycle 17 FOLFIRINOX 02/05/2024, 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 Cycle 18 FOLFIRINOX 02/19/2024, 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 Cycle 19 FOLFIRINOX 03/04/2024, 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 Cycle 20 FOLFIRINOX 03/18/2024, 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 CTs at [facility] 03/27/2024: Stable hepatic metastases, no new hepatic lesion, pancreas tail lesion not well-visualized unchanged right lower lobe groundglass opacity Cycle 21 FOLFIRINOX 04/01/2024, 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 Cycle 22 FOLFIRINOX 04/15/2024, 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 Cycle 23 FOLFIRINOX 04/29/2024, 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 Cycle 24 FOLFIRINOX 05/20/2024, 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 Cycle 25 FOLFIRINOX 06/11/2024, 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 Cycle 26 FOLFIRINOX 06/25/2024, 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 Cycle 27 FOLFIRINOX 07/08/2024, 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 CTs at [facility] 07/14/2024: Stable treated hepatic metastatic disease, pancreas tail lesion not visualized Cycle 28 FOLFIRINOX 07/22/2024, 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 Cycle 29 FOLFIRINOX 08/05/2024, 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 Cycle 30 FOLFIRINOX 08/19/2024, 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 Cycle 31 FOLFIRINOX 09/02/2024, 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  Resolved with Reglan .  Recurrent hiccups following cycle 4 FOLFIRINOX-not relieved with Reglan       Disposition: David [name] appears unchanged.  He continues to tolerate the chemotherapy well.  Oxaliplatin  will remain on hold due to persistent, but improving neuropathy symptoms.  He will complete another cycle of FOLFIRI today.  He will return for an office visit and chemotherapy in 2 weeks.  The CA 19-9 has stabilized over the past several months.  Arley Hof, MD  09/02/2024  9:02 AM

## 2024-09-02 NOTE — Patient Instructions (Signed)

## 2024-09-02 NOTE — Patient Instructions (Signed)
 [facility] - A DEPT OF [facility]  Discharge Instructions: Thank you for choosing [facility] to provide your oncology and hematology care.   If you have a lab appointment with the Cancer Center, please go directly to the Cancer Center and check in at the registration area.   Wear comfortable clothing and clothing appropriate for easy access to any Portacath or PICC line.   We strive to give you quality time with your provider. You may need to reschedule your appointment if you arrive late (15 or more minutes).  Arriving late affects you and other patients whose appointments are after yours.  Also, if you miss three or more appointments without notifying the office, you may be dismissed from the clinic at the provider's discretion.      For prescription refill requests, have your pharmacy contact our office and allow 72 hours for refills to be completed.    Today you received the following chemotherapy and/or immunotherapy agents: leucovorin , irinotecan , fluorouracil        To help prevent nausea and vomiting after your treatment, we encourage you to take your nausea medication as directed.  BELOW ARE SYMPTOMS THAT SHOULD BE REPORTED IMMEDIATELY: *FEVER GREATER THAN 100.4 F (38 C) OR HIGHER *CHILLS OR SWEATING *NAUSEA AND VOMITING THAT IS NOT CONTROLLED WITH YOUR NAUSEA MEDICATION *UNUSUAL SHORTNESS OF BREATH *UNUSUAL BRUISING OR BLEEDING *URINARY PROBLEMS (pain or burning when urinating, or frequent urination) *BOWEL PROBLEMS (unusual diarrhea, constipation, pain near the anus) TENDERNESS IN MOUTH AND THROAT WITH OR WITHOUT PRESENCE OF ULCERS (sore throat, sores in mouth, or a toothache) UNUSUAL RASH, SWELLING OR PAIN  UNUSUAL VAGINAL DISCHARGE OR ITCHING   Items with * indicate a potential emergency and should be followed up as soon as possible or go to the Emergency Department if any problems should occur.  Please show the CHEMOTHERAPY  ALERT CARD or IMMUNOTHERAPY ALERT CARD at check-in to the Emergency Department and triage nurse.  Should you have questions after your visit or need to cancel or reschedule your appointment, please contact [facility] - A DEPT OF [facility]  Dept: [phone]  and follow the prompts.  Office hours are 8:00 a.m. to 4:30 p.m. Monday - Friday. Please note that voicemails left after 4:00 p.m. may not be returned until the following business day.  We are closed weekends and major holidays. You have access to a nurse at all times for urgent questions. Please call the main number to the clinic Dept: [phone] and follow the prompts.   For any non-urgent questions, you may also contact your provider using MyChart. We now offer e-Visits for anyone 29 and older to request care online for non-urgent symptoms. For details visit [email or web address].   Also download the MyChart app! Go to the app store, search MyChart, open the app, select [place], and log in with your MyChart username and password.

## 2024-09-03 LAB — CANCER ANTIGEN 19-9: CA 19-9: 46 U/mL — ABNORMAL HIGH (ref 0–35)

## 2024-09-04 ENCOUNTER — Inpatient Hospital Stay

## 2024-09-04 VITALS — BP 138/80 | HR 84 | Temp 97.9°F | Resp 20

## 2024-09-04 DIAGNOSIS — C259 Malignant neoplasm of pancreas, unspecified: Secondary | ICD-10-CM

## 2024-09-04 DIAGNOSIS — Z5111 Encounter for antineoplastic chemotherapy: Secondary | ICD-10-CM | POA: Diagnosis not present

## 2024-09-04 MED ORDER — PEGFILGRASTIM-JMDB 6 MG/0.6ML ~~LOC~~ SOSY
6.0000 mg | PREFILLED_SYRINGE | Freq: Once | SUBCUTANEOUS | Status: AC
  Administered 2024-09-04: 6 mg via SUBCUTANEOUS
  Filled 2024-09-04: qty 0.6

## 2024-09-04 MED ORDER — SODIUM CHLORIDE 0.9% FLUSH
10.0000 mL | INTRAVENOUS | Status: DC | PRN
  Administered 2024-09-04: 10 mL

## 2024-09-04 NOTE — Patient Instructions (Signed)

## 2024-09-16 ENCOUNTER — Other Ambulatory Visit

## 2024-09-16 ENCOUNTER — Ambulatory Visit

## 2024-09-16 ENCOUNTER — Ambulatory Visit: Admitting: Nurse Practitioner

## 2024-09-18 ENCOUNTER — Other Ambulatory Visit: Payer: Self-pay | Admitting: Oncology

## 2024-09-18 ENCOUNTER — Encounter

## 2024-09-18 DIAGNOSIS — C259 Malignant neoplasm of pancreas, unspecified: Secondary | ICD-10-CM

## 2024-09-22 MED ORDER — SODIUM CHLORIDE 0.9 % IV SOLN
2400.0000 mg/m2 | INTRAVENOUS | Status: DC
  Administered 2024-09-23: 4450 mg via INTRAVENOUS
  Filled 2024-09-22: qty 89

## 2024-09-23 ENCOUNTER — Inpatient Hospital Stay

## 2024-09-23 ENCOUNTER — Inpatient Hospital Stay: Attending: Hematology

## 2024-09-23 ENCOUNTER — Inpatient Hospital Stay (HOSPITAL_BASED_OUTPATIENT_CLINIC_OR_DEPARTMENT_OTHER): Admitting: Nurse Practitioner

## 2024-09-23 ENCOUNTER — Encounter: Payer: Self-pay | Admitting: Nurse Practitioner

## 2024-09-23 ENCOUNTER — Other Ambulatory Visit (HOSPITAL_BASED_OUTPATIENT_CLINIC_OR_DEPARTMENT_OTHER): Payer: Self-pay

## 2024-09-23 VITALS — BP 143/89 | HR 97 | Temp 98.2°F | Resp 18 | Ht 68.0 in | Wt 161.0 lb

## 2024-09-23 VITALS — BP 141/82 | HR 73 | Temp 97.6°F | Resp 18

## 2024-09-23 DIAGNOSIS — Z5189 Encounter for other specified aftercare: Secondary | ICD-10-CM | POA: Diagnosis not present

## 2024-09-23 DIAGNOSIS — C259 Malignant neoplasm of pancreas, unspecified: Secondary | ICD-10-CM

## 2024-09-23 DIAGNOSIS — Z800 Family history of malignant neoplasm of digestive organs: Secondary | ICD-10-CM | POA: Diagnosis not present

## 2024-09-23 DIAGNOSIS — Z5111 Encounter for antineoplastic chemotherapy: Secondary | ICD-10-CM | POA: Diagnosis present

## 2024-09-23 DIAGNOSIS — G629 Polyneuropathy, unspecified: Secondary | ICD-10-CM | POA: Insufficient documentation

## 2024-09-23 DIAGNOSIS — C787 Secondary malignant neoplasm of liver and intrahepatic bile duct: Secondary | ICD-10-CM | POA: Insufficient documentation

## 2024-09-23 DIAGNOSIS — C252 Malignant neoplasm of tail of pancreas: Secondary | ICD-10-CM | POA: Insufficient documentation

## 2024-09-23 LAB — CBC WITH DIFFERENTIAL (CANCER CENTER ONLY)
Abs Immature Granulocytes: 0.2 K/uL — ABNORMAL HIGH (ref 0.00–0.07)
Basophils Absolute: 0.1 K/uL (ref 0.0–0.1)
Basophils Relative: 1 %
Eosinophils Absolute: 0.2 K/uL (ref 0.0–0.5)
Eosinophils Relative: 3 %
HCT: 38.4 % — ABNORMAL LOW (ref 39.0–52.0)
Hemoglobin: 12.6 g/dL — ABNORMAL LOW (ref 13.0–17.0)
Immature Granulocytes: 4 %
Lymphocytes Relative: 17 %
Lymphs Abs: 0.9 K/uL (ref 0.7–4.0)
MCH: 32.5 pg (ref 26.0–34.0)
MCHC: 32.8 g/dL (ref 30.0–36.0)
MCV: 99 fL (ref 80.0–100.0)
Monocytes Absolute: 0.7 K/uL (ref 0.1–1.0)
Monocytes Relative: 13 %
Neutro Abs: 3.4 K/uL (ref 1.7–7.7)
Neutrophils Relative %: 62 %
Platelet Count: 178 K/uL (ref 150–400)
RBC: 3.88 MIL/uL — ABNORMAL LOW (ref 4.22–5.81)
RDW: 13.1 % (ref 11.5–15.5)
WBC Count: 5.5 K/uL (ref 4.0–10.5)
nRBC: 0 % (ref 0.0–0.2)

## 2024-09-23 LAB — CMP (CANCER CENTER ONLY)
ALT: 44 U/L (ref 0–44)
AST: 28 U/L (ref 15–41)
Albumin: 4.5 g/dL (ref 3.5–5.0)
Alkaline Phosphatase: 212 U/L — ABNORMAL HIGH (ref 38–126)
Anion gap: 14 (ref 5–15)
BUN: 14 mg/dL (ref 6–20)
CO2: 22 mmol/L (ref 22–32)
Calcium: 10.1 mg/dL (ref 8.9–10.3)
Chloride: 102 mmol/L (ref 98–111)
Creatinine: 0.81 mg/dL (ref 0.61–1.24)
GFR, Estimated: >60 mL/min (ref >60)
Glucose, Bld: 248 mg/dL — ABNORMAL HIGH (ref 70–99)
Potassium: 4.3 mmol/L (ref 3.5–5.1)
Sodium: 138 mmol/L (ref 135–145)
Total Bilirubin: 0.6 mg/dL (ref 0.0–1.2)
Total Protein: 7.4 g/dL (ref 6.5–8.1)

## 2024-09-23 MED ORDER — SODIUM CHLORIDE 0.9 % IV SOLN: INTRAVENOUS | Status: DC

## 2024-09-23 MED ORDER — SODIUM CHLORIDE 0.9 % IV SOLN
400.0000 mg/m2 | Freq: Once | INTRAVENOUS | Status: AC
  Administered 2024-09-23: 744 mg via INTRAVENOUS
  Filled 2024-09-23: qty 37.2

## 2024-09-23 MED ORDER — DEXAMETHASONE SODIUM PHOSPHATE 10 MG/ML IJ SOLN
5.0000 mg | Freq: Once | INTRAMUSCULAR | Status: AC
  Administered 2024-09-23: 5 mg via INTRAVENOUS
  Filled 2024-09-23: qty 1

## 2024-09-23 MED ORDER — PALONOSETRON HCL INJECTION 0.25 MG/5ML
0.2500 mg | Freq: Once | INTRAVENOUS | Status: AC
  Administered 2024-09-23: 0.25 mg via INTRAVENOUS
  Filled 2024-09-23: qty 5

## 2024-09-23 MED ORDER — ATROPINE SULFATE 1 MG/ML IV SOLN
0.5000 mg | Freq: Once | INTRAVENOUS | Status: AC | PRN
  Administered 2024-09-23: 0.5 mg via INTRAVENOUS
  Filled 2024-09-23: qty 1

## 2024-09-23 MED ORDER — SODIUM CHLORIDE 0.9 % IV SOLN
150.0000 mg/m2 | Freq: Once | INTRAVENOUS | Status: AC
  Administered 2024-09-23: 300 mg via INTRAVENOUS
  Filled 2024-09-23: qty 15

## 2024-09-23 MED ORDER — SODIUM CHLORIDE 0.9 % IV SOLN
150.0000 mg | Freq: Once | INTRAVENOUS | Status: AC
  Administered 2024-09-23: 150 mg via INTRAVENOUS
  Filled 2024-09-23: qty 150

## 2024-09-23 NOTE — Progress Notes (Signed)
 Patient seen by Olam Ned NP today  Vitals are within treatment parameters:Yes   Labs are within treatment parameters: Yes   Treatment plan has been signed: Yes   Per physician team, Patient is ready for treatment and there are NO modifications to the treatment plan.

## 2024-09-23 NOTE — Patient Instructions (Signed)
 [facility] - A DEPT OF [facility]  Discharge Instructions: Thank you for choosing [facility] to provide your oncology and hematology care.   If you have a lab appointment with the Cancer Center, please go directly to the Cancer Center and check in at the registration area.   Wear comfortable clothing and clothing appropriate for easy access to any Portacath or PICC line.   We strive to give you quality time with your provider. You may need to reschedule your appointment if you arrive late (15 or more minutes).  Arriving late affects you and other patients whose appointments are after yours.  Also, if you miss three or more appointments without notifying the office, you may be dismissed from the clinic at the provider's discretion.      For prescription refill requests, have your pharmacy contact our office and allow 72 hours for refills to be completed.    Today you received the following chemotherapy and/or immunotherapy agents: leucovorin , irinotecan , fluorouracil        To help prevent nausea and vomiting after your treatment, we encourage you to take your nausea medication as directed.  BELOW ARE SYMPTOMS THAT SHOULD BE REPORTED IMMEDIATELY: *FEVER GREATER THAN 100.4 F (38 C) OR HIGHER *CHILLS OR SWEATING *NAUSEA AND VOMITING THAT IS NOT CONTROLLED WITH YOUR NAUSEA MEDICATION *UNUSUAL SHORTNESS OF BREATH *UNUSUAL BRUISING OR BLEEDING *URINARY PROBLEMS (pain or burning when urinating, or frequent urination) *BOWEL PROBLEMS (unusual diarrhea, constipation, pain near the anus) TENDERNESS IN MOUTH AND THROAT WITH OR WITHOUT PRESENCE OF ULCERS (sore throat, sores in mouth, or a toothache) UNUSUAL RASH, SWELLING OR PAIN  UNUSUAL VAGINAL DISCHARGE OR ITCHING   Items with * indicate a potential emergency and should be followed up as soon as possible or go to the Emergency Department if any problems should occur.  Please show the CHEMOTHERAPY  ALERT CARD or IMMUNOTHERAPY ALERT CARD at check-in to the Emergency Department and triage nurse.  Should you have questions after your visit or need to cancel or reschedule your appointment, please contact [facility] - A DEPT OF [facility]  Dept: [phone]  and follow the prompts.  Office hours are 8:00 a.m. to 4:30 p.m. Monday - Friday. Please note that voicemails left after 4:00 p.m. may not be returned until the following business day.  We are closed weekends and major holidays. You have access to a nurse at all times for urgent questions. Please call the main number to the clinic Dept: [phone] and follow the prompts.   For any non-urgent questions, you may also contact your provider using MyChart. We now offer e-Visits for anyone 29 and older to request care online for non-urgent symptoms. For details visit [email or web address].   Also download the MyChart app! Go to the app store, search MyChart, open the app, select [place], and log in with your MyChart username and password.

## 2024-09-23 NOTE — Patient Instructions (Signed)

## 2024-09-23 NOTE — Progress Notes (Signed)
 David [name]   Diagnosis: Pancreas cancer  INTERVAL HISTORY:   David [name] returns as scheduled.  He completed another cycle of FOLFIRI 09/02/2024.  He denies nausea/vomiting.  No mouth sores though he did [name] some tenderness in his mouth.  For about 1 week after treatment he had more frequent loose stools.  He does not characterize this as diarrhea.  He had back pain when he had a bowel movement.  For the past 2 weeks bowels have been moving normally and he is no longer having back pain.  Neuropathy symptoms are stable.  He had hiccups for a few hours after treatment.  Objective:  Vital signs in last 24 hours:  Blood pressure (!) 143/89, pulse 97, temperature 98.2 F (36.8 C), temperature source Temporal, resp. rate 18, height 5' 8 (1.727 m), weight 161 lb (73 kg), SpO2 100%.    HEENT: No thrush or ulcers. Resp: Lungs clear bilaterally. Cardio: Regular rate and rhythm. GI: No hepatosplenomegaly.  No mass.  Nontender. Vascular: No leg edema. Skin: Palms without erythema. Port-A-Cath without erythema.  Lab Results:  Lab Results  Component Value Date   WBC 5.5 09/23/2024   HGB 12.6 (L) 09/23/2024   HCT 38.4 (L) 09/23/2024   MCV 99.0 09/23/2024   PLT 178 09/23/2024   NEUTROABS 3.4 09/23/2024    Imaging:  No results found.  Medications: I have reviewed the patient's current medications.  Assessment/Plan: Pancreas cancer CT angiogram chest 04/24/2023-low-density mass in the dome of the liver with at least 3 additional lesions in both hepatic lobes, borderline lymphadenopathy in the hepatoduodenal ligament Right upper quadrant ultrasound 04/24/2023-large circumscribed mass of the liver dome CT abdomen/pelvis 04/24/2023-multiple liver lesions poorly defined on noncontrast exam, prominent portacaval and porta hepatis nodes MRI abdomen 05/05/2023-multiple rim-hypoenhancing liver lesions consistent with hepatic metastases, suspicion for a pancreas  tail mass Ultrasound-guided biopsy of dominant right liver lesion 05/10/2023-adenocarcinoma, CK7 and CDX2 positive, abundant cytoplasm and mucin with extracellular mucin, foci of lymphovascular invasion Tempus gene panel-K-ras G12V, T P53 mild tumor mutation burden 3.2, MSS PET 06/04/2023-multiple hypermetabolic liver lesions consistent with metastases, low-level FDG uptake in the soft tissue fullness at the tip of the pancreas tail, low-level FDG activity involving a small soft tissue nodule between the gastric fundus and spleen, tiny foci of accumulation at the right costovertebral junction at T10 and roof of the left acetabulum without an underlying CT lesion Elevated CEA and CA 19-9 EUS 06/07/2023 ,22x 15 mm irregular mass in the tail the pancreas, 11 mm subcarinal lymph node, FNA biopsy of the pancreas mass-suspicious for malignancy Cycle 1 FOLFIRINOX 06/19/2023 Cycle 2 FOLFIRINOX 07/03/2023 Cycle 3 FOLFIRINOX 07/17/2023 Cycle 4 FOLFIRINOX 07/31/2023 Cycle 5 FOLFIRINOX 08/14/2023, oxaliplatin  and Decadron  dose reduced Cycle 6 FOLFIRINOX 08/28/2023 Cycle 7 FOLFIRINOX 09/11/2023 CTs at [facility] 09/16/2023, compared to 04/24/2023-unchanged pancreas tail mass, decrease in attenuation of hepatic metastases, several lesions have decreased in size, no new lesions Cycle 8 FOLFIRINOX 09/25/2023 Cycle 9 FOLFIRINOX 10/09/2023 Cycle 10 FOLFIRINOX 10/22/2023 Cycle 11 FOLFIRINOX 11/05/2023 Cycle 12 FOLFIRINOX 11/20/2023 Cycle 13 FOLFIRINOX 12/04/2023 Cycle 14 FOLFIRINOX 12/19/2023 CTs at [facility] 12/23/2023: Stable to slight decrease in hepatic metastases, no new lesions, stable pancreas tail mass MRI liver at [facility] 12/26/2023: Multifocal nonenhancing hepatic metastases, pancreas tail mass not well-visualized Cycle 15 FOLFIRINOX 01/01/2024, 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 Cycle 16 FOLFIRINOX 01/15/2024, 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 Cycle 17 FOLFIRINOX 02/05/2024, 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 Cycle 18  FOLFIRINOX 02/19/2024, oxaliplatin  held secondary  to neuropathy Cycle 19 FOLFIRINOX 03/04/2024, 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 Cycle 20 FOLFIRINOX 03/18/2024, 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 CTs at [facility] 03/27/2024: Stable hepatic metastases, no new hepatic lesion, pancreas tail lesion not well-visualized unchanged right lower lobe groundglass opacity Cycle 21 FOLFIRINOX 04/01/2024, 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 Cycle 22 FOLFIRINOX 04/15/2024, 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 Cycle 23 FOLFIRINOX 04/29/2024, 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 Cycle 24 FOLFIRINOX 05/20/2024, 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 Cycle 25 FOLFIRINOX 06/11/2024, 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 Cycle 26 FOLFIRINOX 06/25/2024, 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 Cycle 27 FOLFIRINOX 07/08/2024, 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 CTs at [facility] 07/14/2024: Stable treated hepatic metastatic disease, pancreas tail lesion not visualized Cycle 28 FOLFIRINOX 07/22/2024, 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 Cycle 29 FOLFIRINOX 08/05/2024, 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 Cycle 30 FOLFIRINOX 08/19/2024, 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 Cycle 31 FOLFIRINOX 09/02/2024, 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 Cycle 32 FOLFIRINOX 09/23/2024, 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  Resolved with Reglan .  Recurrent hiccups following cycle 4 FOLFIRINOX-not relieved with Reglan       Disposition: David [name] appears stable.  He has completed 31 cycles of systemic therapy.  He is tolerating treatment well.  There is no clinical evidence of disease progression.  Plan to proceed with another cycle of FOLFIRI today as scheduled.  Oxaliplatin  remains on hold due to neuropathy.  CBC and chemistry panel reviewed.  Labs are  adequate for treatment.  We will follow-up on the CA 19-9.  He will return for follow-up and treatment in 3 weeks rather than 2 per his request.    David [name] ANP/GNP-BC   09/23/2024  10:23 AM

## 2024-09-24 ENCOUNTER — Other Ambulatory Visit: Payer: Self-pay

## 2024-09-24 LAB — CANCER ANTIGEN 19-9: CA 19-9: 61 U/mL — ABNORMAL HIGH (ref 0–35)

## 2024-09-25 ENCOUNTER — Inpatient Hospital Stay

## 2024-09-25 VITALS — BP 133/53 | HR 76 | Temp 97.2°F | Resp 18

## 2024-09-25 DIAGNOSIS — Z5111 Encounter for antineoplastic chemotherapy: Secondary | ICD-10-CM | POA: Diagnosis not present

## 2024-09-25 DIAGNOSIS — C259 Malignant neoplasm of pancreas, unspecified: Secondary | ICD-10-CM

## 2024-09-25 MED ORDER — PEGFILGRASTIM-JMDB 6 MG/0.6ML ~~LOC~~ SOSY
6.0000 mg | PREFILLED_SYRINGE | Freq: Once | SUBCUTANEOUS | Status: AC
  Administered 2024-09-25: 6 mg via SUBCUTANEOUS
  Filled 2024-09-25: qty 0.6

## 2024-09-25 NOTE — Patient Instructions (Signed)

## 2024-09-27 ENCOUNTER — Encounter: Payer: Self-pay | Admitting: Oncology

## 2024-10-07 ENCOUNTER — Other Ambulatory Visit: Payer: Self-pay | Admitting: Oncology

## 2024-10-14 ENCOUNTER — Inpatient Hospital Stay

## 2024-10-14 ENCOUNTER — Encounter: Payer: Self-pay | Admitting: Nurse Practitioner

## 2024-10-14 ENCOUNTER — Inpatient Hospital Stay (HOSPITAL_BASED_OUTPATIENT_CLINIC_OR_DEPARTMENT_OTHER): Admitting: Nurse Practitioner

## 2024-10-14 VITALS — BP 138/84 | HR 89 | Temp 97.8°F | Resp 18 | Ht 68.0 in | Wt 162.7 lb

## 2024-10-14 VITALS — BP 151/82 | HR 62 | Temp 98.1°F | Resp 18

## 2024-10-14 DIAGNOSIS — C787 Secondary malignant neoplasm of liver and intrahepatic bile duct: Secondary | ICD-10-CM | POA: Diagnosis not present

## 2024-10-14 DIAGNOSIS — Z5111 Encounter for antineoplastic chemotherapy: Secondary | ICD-10-CM | POA: Diagnosis not present

## 2024-10-14 DIAGNOSIS — C259 Malignant neoplasm of pancreas, unspecified: Secondary | ICD-10-CM | POA: Diagnosis not present

## 2024-10-14 LAB — CBC WITH DIFFERENTIAL (CANCER CENTER ONLY)
Abs Immature Granulocytes: 0.07 K/uL (ref 0.00–0.07)
Basophils Absolute: 0 K/uL (ref 0.0–0.1)
Basophils Relative: 1 %
Eosinophils Absolute: 0.1 K/uL (ref 0.0–0.5)
Eosinophils Relative: 3 %
HCT: 36.9 % — ABNORMAL LOW (ref 39.0–52.0)
Hemoglobin: 12.5 g/dL — ABNORMAL LOW (ref 13.0–17.0)
Immature Granulocytes: 1 %
Lymphocytes Relative: 17 %
Lymphs Abs: 0.9 K/uL (ref 0.7–4.0)
MCH: 32.7 pg (ref 26.0–34.0)
MCHC: 33.9 g/dL (ref 30.0–36.0)
MCV: 96.6 fL (ref 80.0–100.0)
Monocytes Absolute: 0.5 K/uL (ref 0.1–1.0)
Monocytes Relative: 11 %
Neutro Abs: 3.4 K/uL (ref 1.7–7.7)
Neutrophils Relative %: 67 %
Platelet Count: 146 K/uL — ABNORMAL LOW (ref 150–400)
RBC: 3.82 MIL/uL — ABNORMAL LOW (ref 4.22–5.81)
RDW: 13.1 % (ref 11.5–15.5)
WBC Count: 5.1 K/uL (ref 4.0–10.5)
nRBC: 0 % (ref 0.0–0.2)

## 2024-10-14 LAB — CMP (CANCER CENTER ONLY)
ALT: 32 U/L (ref 0–44)
AST: 26 U/L (ref 15–41)
Albumin: 4.3 g/dL (ref 3.5–5.0)
Alkaline Phosphatase: 197 U/L — ABNORMAL HIGH (ref 38–126)
Anion gap: 9 (ref 5–15)
BUN: 15 mg/dL (ref 6–20)
CO2: 26 mmol/L (ref 22–32)
Calcium: 9.9 mg/dL (ref 8.9–10.3)
Chloride: 100 mmol/L (ref 98–111)
Creatinine: 0.83 mg/dL (ref 0.61–1.24)
GFR, Estimated: >60 mL/min (ref >60)
Glucose, Bld: 229 mg/dL — ABNORMAL HIGH (ref 70–99)
Potassium: 4.2 mmol/L (ref 3.5–5.1)
Sodium: 135 mmol/L (ref 135–145)
Total Bilirubin: 1.1 mg/dL (ref 0.0–1.2)
Total Protein: 6.9 g/dL (ref 6.5–8.1)

## 2024-10-14 MED ORDER — SODIUM CHLORIDE 0.9 % IV SOLN
400.0000 mg/m2 | Freq: Once | INTRAVENOUS | Status: AC
  Administered 2024-10-14: 744 mg via INTRAVENOUS
  Filled 2024-10-14: qty 37.2

## 2024-10-14 MED ORDER — SODIUM CHLORIDE 0.9 % IV SOLN: INTRAVENOUS | Status: DC

## 2024-10-14 MED ORDER — SODIUM CHLORIDE 0.9 % IV SOLN
2400.0000 mg/m2 | INTRAVENOUS | Status: DC
  Administered 2024-10-14: 4450 mg via INTRAVENOUS
  Filled 2024-10-14: qty 89

## 2024-10-14 MED ORDER — PALONOSETRON HCL INJECTION 0.25 MG/5ML
0.2500 mg | Freq: Once | INTRAVENOUS | Status: AC
  Administered 2024-10-14: 0.25 mg via INTRAVENOUS
  Filled 2024-10-14: qty 5

## 2024-10-14 MED ORDER — PANTOPRAZOLE SODIUM 40 MG PO TBEC: 40.0000 mg | DELAYED_RELEASE_TABLET | Freq: Every day | ORAL | 3 refills | Status: AC

## 2024-10-14 MED ORDER — SODIUM CHLORIDE 0.9 % IV SOLN
150.0000 mg | Freq: Once | INTRAVENOUS | Status: AC
  Administered 2024-10-14: 150 mg via INTRAVENOUS
  Filled 2024-10-14: qty 150

## 2024-10-14 MED ORDER — SODIUM CHLORIDE 0.9 % IV SOLN
150.0000 mg/m2 | Freq: Once | INTRAVENOUS | Status: AC
  Administered 2024-10-14: 300 mg via INTRAVENOUS
  Filled 2024-10-14: qty 15

## 2024-10-14 MED ORDER — ATROPINE SULFATE 1 MG/ML IV SOLN
0.5000 mg | Freq: Once | INTRAVENOUS | Status: AC | PRN
  Administered 2024-10-14: 0.5 mg via INTRAVENOUS
  Filled 2024-10-14: qty 1

## 2024-10-14 MED ORDER — DEXAMETHASONE SODIUM PHOSPHATE 10 MG/ML IJ SOLN
5.0000 mg | Freq: Once | INTRAMUSCULAR | Status: AC
  Administered 2024-10-14: 5 mg via INTRAVENOUS

## 2024-10-14 NOTE — Progress Notes (Signed)
 [facility] OFFICE PROGRESS NOTE   Diagnosis: Pancreas cancer  INTERVAL HISTORY:   David [name] returns as scheduled.  He completed a cycle of FOLFIRI 09/23/2024.  He had hiccups for about an hour after treatment.  He denies nausea/vomiting.  No mouth sores.  No diarrhea.  Neuropathy symptoms are stable.  No abdominal pain.  Good appetite.  Objective:  Vital signs in last 24 hours:  Blood pressure 138/84, pulse 89, temperature 97.8 F (36.6 C), temperature source Temporal, resp. rate 18, height 5' 8 (1.727 m), weight 162 lb 11.2 oz (73.8 kg), SpO2 100%.    HEENT: No thrush or ulcers. Resp: Lungs clear bilaterally Cardio: Regular rate and rhythm. GI: No hepatosplenomegaly.  Nontender. Vascular: No leg edema. Skin: Palms without erythema. Port-A-Cath without erythema.  Lab Results:  Lab Results  Component Value Date   WBC 5.5 09/23/2024   HGB 12.6 (L) 09/23/2024   HCT 38.4 (L) 09/23/2024   MCV 99.0 09/23/2024   PLT 178 09/23/2024   NEUTROABS 3.4 09/23/2024    Imaging:  No results found.  Medications: I have reviewed the patient's current medications.  Assessment/Plan: Pancreas cancer CT angiogram chest 04/24/2023-low-density mass in the dome of the liver with at least 3 additional lesions in both hepatic lobes, borderline lymphadenopathy in the hepatoduodenal ligament Right upper quadrant ultrasound 04/24/2023-large circumscribed mass of the liver dome CT abdomen/pelvis 04/24/2023-multiple liver lesions poorly defined on noncontrast exam, prominent portacaval and porta hepatis nodes MRI abdomen 05/05/2023-multiple rim-hypoenhancing liver lesions consistent with hepatic metastases, suspicion for a pancreas tail mass Ultrasound-guided biopsy of dominant right liver lesion 05/10/2023-adenocarcinoma, CK7 and CDX2 positive, abundant cytoplasm and mucin with extracellular mucin, foci of lymphovascular invasion Tempus gene panel-K-ras G12V, T P53 mild tumor mutation  burden 3.2, MSS PET 06/04/2023-multiple hypermetabolic liver lesions consistent with metastases, low-level FDG uptake in the soft tissue fullness at the tip of the pancreas tail, low-level FDG activity involving a small soft tissue nodule between the gastric fundus and spleen, tiny foci of accumulation at the right costovertebral junction at T10 and roof of the left acetabulum without an underlying CT lesion Elevated CEA and CA 19-9 EUS 06/07/2023 ,22x 15 mm irregular mass in the tail the pancreas, 11 mm subcarinal lymph node, FNA biopsy of the pancreas mass-suspicious for malignancy Cycle 1 FOLFIRINOX 06/19/2023 Cycle 2 FOLFIRINOX 07/03/2023 Cycle 3 FOLFIRINOX 07/17/2023 Cycle 4 FOLFIRINOX 07/31/2023 Cycle 5 FOLFIRINOX 08/14/2023, oxaliplatin  and Decadron  dose reduced Cycle 6 FOLFIRINOX 08/28/2023 Cycle 7 FOLFIRINOX 09/11/2023 CTs at [facility] 09/16/2023, compared to 04/24/2023-unchanged pancreas tail mass, decrease in attenuation of hepatic metastases, several lesions have decreased in size, no new lesions Cycle 8 FOLFIRINOX 09/25/2023 Cycle 9 FOLFIRINOX 10/09/2023 Cycle 10 FOLFIRINOX 10/22/2023 Cycle 11 FOLFIRINOX 11/05/2023 Cycle 12 FOLFIRINOX 11/20/2023 Cycle 13 FOLFIRINOX 12/04/2023 Cycle 14 FOLFIRINOX 12/19/2023 CTs at [facility] 12/23/2023: Stable to slight decrease in hepatic metastases, no new lesions, stable pancreas tail mass MRI liver at [facility] 12/26/2023: Multifocal nonenhancing hepatic metastases, pancreas tail mass not well-visualized Cycle 15 FOLFIRINOX 01/01/2024, 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 Cycle 16 FOLFIRINOX 01/15/2024, 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 Cycle 17 FOLFIRINOX 02/05/2024, 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 Cycle 18 FOLFIRINOX 02/19/2024, 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 Cycle 19 FOLFIRINOX 03/04/2024, 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 Cycle 20 FOLFIRINOX 03/18/2024, 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 CTs at [facility] 03/27/2024: Stable hepatic metastases, no  new hepatic lesion, pancreas tail lesion not well-visualized unchanged right lower lobe groundglass opacity Cycle 21 FOLFIRINOX 04/01/2024, 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 Cycle  22 FOLFIRINOX 04/15/2024, 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 Cycle 23 FOLFIRINOX 04/29/2024, 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 Cycle 24 FOLFIRINOX 05/20/2024, 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 Cycle 25 FOLFIRINOX 06/11/2024, 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 Cycle 26 FOLFIRINOX 06/25/2024, 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 Cycle 27 FOLFIRINOX 07/08/2024, 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 CTs at [facility] 07/14/2024: Stable treated hepatic metastatic disease, pancreas tail lesion not visualized Cycle 28 FOLFIRINOX 07/22/2024, 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 Cycle 29 FOLFIRINOX 08/05/2024, 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 Cycle 30 FOLFIRINOX 08/19/2024, 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 Cycle 31 FOLFIRINOX 09/02/2024, 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 Cycle 32 FOLFIRINOX 09/23/2024, 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 Cycle 33 FOLFIRINOX 10/14/2024, 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  Resolved with Reglan .  Recurrent hiccups following cycle 4 FOLFIRINOX-not relieved with Reglan       Disposition: David [name] appears stable.  He continues FOLFIRI.  He is tolerating well.  No clinical evidence of disease progression.  Plan to proceed with treatment today as scheduled.  He has scans and follow-up with Dr. Wendell at [facility] next week.  CBC and chemistry panel reviewed.  Labs adequate for treatment.  We will follow-up on the CA 19-9.  He will return for follow-up in 2 weeks.  We are available to see him sooner if needed.    Olam Ned ANP/GNP-BC   10/14/2024  10:04 AM

## 2024-10-14 NOTE — Patient Instructions (Signed)
 [facility] - A DEPT OF [facility]  Discharge Instructions: Thank you for choosing [facility] to provide your oncology and hematology care.   If you have a lab appointment with the Cancer Center, please go directly to the Cancer Center and check in at the registration area.   Wear comfortable clothing and clothing appropriate for easy access to any Portacath or PICC line.   We strive to give you quality time with your provider. You may need to reschedule your appointment if you arrive late (15 or more minutes).  Arriving late affects you and other patients whose appointments are after yours.  Also, if you miss three or more appointments without notifying the office, you may be dismissed from the clinic at the provider's discretion.      For prescription refill requests, have your pharmacy contact our office and allow 72 hours for refills to be completed.    Today you received the following chemotherapy and/or immunotherapy agents: leucovorin , irinotecan , fluorouracil        To help prevent nausea and vomiting after your treatment, we encourage you to take your nausea medication as directed.  BELOW ARE SYMPTOMS THAT SHOULD BE REPORTED IMMEDIATELY: *FEVER GREATER THAN 100.4 F (38 C) OR HIGHER *CHILLS OR SWEATING *NAUSEA AND VOMITING THAT IS NOT CONTROLLED WITH YOUR NAUSEA MEDICATION *UNUSUAL SHORTNESS OF BREATH *UNUSUAL BRUISING OR BLEEDING *URINARY PROBLEMS (pain or burning when urinating, or frequent urination) *BOWEL PROBLEMS (unusual diarrhea, constipation, pain near the anus) TENDERNESS IN MOUTH AND THROAT WITH OR WITHOUT PRESENCE OF ULCERS (sore throat, sores in mouth, or a toothache) UNUSUAL RASH, SWELLING OR PAIN  UNUSUAL VAGINAL DISCHARGE OR ITCHING   Items with * indicate a potential emergency and should be followed up as soon as possible or go to the Emergency Department if any problems should occur.  Please show the CHEMOTHERAPY  ALERT CARD or IMMUNOTHERAPY ALERT CARD at check-in to the Emergency Department and triage nurse.  Should you have questions after your visit or need to cancel or reschedule your appointment, please contact [facility] - A DEPT OF [facility]  Dept: [phone]  and follow the prompts.  Office hours are 8:00 a.m. to 4:30 p.m. Monday - Friday. Please note that voicemails left after 4:00 p.m. may not be returned until the following business day.  We are closed weekends and major holidays. You have access to a nurse at all times for urgent questions. Please call the main number to the clinic Dept: [phone] and follow the prompts.   For any non-urgent questions, you may also contact your provider using MyChart. We now offer e-Visits for anyone 29 and older to request care online for non-urgent symptoms. For details visit [email or web address].   Also download the MyChart app! Go to the app store, search MyChart, open the app, select [place], and log in with your MyChart username and password.

## 2024-10-14 NOTE — Patient Instructions (Signed)

## 2024-10-14 NOTE — Progress Notes (Signed)
 Patient seen by Olam Ned NP today  Vitals are within treatment parameters:Yes   Labs are within treatment parameters: Yes   Treatment plan has been signed: Yes   Per physician team, Patient is ready for treatment and there are NO modifications to the treatment plan.

## 2024-10-15 ENCOUNTER — Other Ambulatory Visit: Payer: Self-pay | Admitting: Nurse Practitioner

## 2024-10-15 DIAGNOSIS — C259 Malignant neoplasm of pancreas, unspecified: Secondary | ICD-10-CM

## 2024-10-15 LAB — CANCER ANTIGEN 19-9: CA 19-9: 74 U/mL — ABNORMAL HIGH (ref 0–35)

## 2024-10-15 MED ORDER — HYDROCODONE-ACETAMINOPHEN 5-325 MG PO TABS: 0.5000 | ORAL_TABLET | Freq: Four times a day (QID) | ORAL | 0 refills | Status: AC | PRN

## 2024-10-16 ENCOUNTER — Other Ambulatory Visit: Payer: Self-pay

## 2024-10-16 ENCOUNTER — Inpatient Hospital Stay

## 2024-10-16 VITALS — BP 147/84 | Temp 98.2°F | Resp 16

## 2024-10-16 DIAGNOSIS — C787 Secondary malignant neoplasm of liver and intrahepatic bile duct: Secondary | ICD-10-CM

## 2024-10-16 DIAGNOSIS — Z5111 Encounter for antineoplastic chemotherapy: Secondary | ICD-10-CM | POA: Diagnosis not present

## 2024-10-16 MED ORDER — PEGFILGRASTIM-JMDB 6 MG/0.6ML ~~LOC~~ SOSY
6.0000 mg | PREFILLED_SYRINGE | Freq: Once | SUBCUTANEOUS | Status: AC
  Administered 2024-10-16: 6 mg via SUBCUTANEOUS

## 2024-10-16 NOTE — Patient Instructions (Signed)
 [facility] - A DEPT OF [facility]  Discharge Instructions: Thank you for choosing [facility] to provide your oncology and hematology care.   If you have a lab appointment with the Cancer Center, please go directly to the Cancer Center and check in at the registration area.   Wear comfortable clothing and clothing appropriate for easy access to any Portacath or PICC line.   We strive to give you quality time with your provider. You may need to reschedule your appointment if you arrive late (15 or more minutes).  Arriving late affects you and other patients whose appointments are after yours.  Also, if you miss three or more appointments without notifying the office, you may be dismissed from the clinic at the provider's discretion.      For prescription refill requests, have your pharmacy contact our office and allow 72 hours for refills to be completed.    Today you received the following Pump stop and Fulphila .  Pegfilgrastim  Injection What is this medication? PEGFILGRASTIM  (PEG fil gra stim) lowers the risk of infection in people who are receiving chemotherapy. It works by systems analyst make more white blood cells, which protects your body from infection. It may also be used to help people who have been exposed to high doses of radiation. This medicine may be used for other purposes; ask your health care provider or pharmacist if you have questions. COMMON BRAND NAME(S): Fulphila , Fylnetra , Neulasta , Nyvepria , Stimufend , UDENYCA , UDENYCA  ONBODY, Ziextenzo  What should I tell my care team before I take this medication? They need to know if you have any of these conditions: Kidney disease Latex allergy Ongoing radiation therapy Sickle cell disease Skin reactions to acrylic adhesives (On-Body Injector only) An unusual or allergic reaction to pegfilgrastim , filgrastim, other medications, foods, dyes, or preservatives Pregnant or trying to  get pregnant Breast-feeding How should I use this medication? This medication is for injection under the skin. If you get this medication at home, you will be taught how to prepare and give the pre-filled syringe or how to use the On-body Injector. Refer to the patient Instructions for Use for detailed instructions. Use exactly as directed. Tell your care team immediately if you suspect that the On-body Injector may not have performed as intended or if you suspect the use of the On-body Injector resulted in a missed or partial dose. It is important that you put your used needles and syringes in a special sharps container. Do not put them in a trash can. If you do not have a sharps container, call your pharmacist or care team to get one. Talk to your care team about the use of this medication in children. While this medication may be prescribed for selected conditions, precautions do apply. Overdosage: If you think you have taken too much of this medicine contact a poison control center or emergency room at once. NOTE: This medicine is only for you. Do not share this medicine with others. What if I miss a dose? It is important not to miss your dose. Call your care team if you miss your dose. If you miss a dose due to an On-body Injector failure or leakage, a new dose should be administered as soon as possible using a single prefilled syringe for manual use. What may interact with this medication? Interactions have not been studied. This list may not describe all possible interactions. Give your health care provider a list of all the medicines,  herbs, non-prescription drugs, or dietary supplements you use. Also tell them if you smoke, drink alcohol, or use illegal drugs. Some items may interact with your medicine. What should I watch for while using this medication? Your condition will be monitored carefully while you are receiving this medication. You may need blood work done while you are taking this  medication. Talk to your care team about your risk of cancer. You may be more at risk for certain types of cancer if you take this medication. If you are going to need a MRI, CT scan, or other procedure, tell your care team that you are using this medication (On-Body Injector only). What side effects may I notice from receiving this medication? Side effects that you should report to your care team as soon as possible: Allergic reactions--skin rash, itching, hives, swelling of the face, lips, tongue, or throat Capillary leak syndrome--stomach or muscle pain, unusual weakness or fatigue, feeling faint or lightheaded, decrease in the amount of urine, swelling of the ankles, hands, or feet, trouble breathing High white blood cell level--fever, fatigue, trouble breathing, night sweats, change in vision, weight loss Inflammation of the aorta--fever, fatigue, back, chest, or stomach pain, severe headache Kidney injury (glomerulonephritis)--decrease in the amount of urine, red or dark brown urine, foamy or bubbly urine, swelling of the ankles, hands, or feet Shortness of breath or trouble breathing Spleen injury--pain in upper left stomach or shoulder Unusual bruising or bleeding Side effects that usually do not require medical attention (report to your care team if they continue or are bothersome): Bone pain Pain in the hands or feet This list may not describe all possible side effects. Call your doctor for medical advice about side effects. You may report side effects to FDA at 1-800-FDA-1088. Where should I keep my medication? Keep out of the reach of children. If you are using this medication at home, you will be instructed on how to store it. Throw away any unused medication after the expiration date on the label. NOTE: This sheet is a summary. It may not cover all possible information. If you have questions about this medicine, talk to your doctor, pharmacist, or health care provider.  2024  Elsevier/Gold Standard (2021-11-03 00:00:00)   To help prevent nausea and vomiting after your treatment, we encourage you to take your nausea medication as directed.  BELOW ARE SYMPTOMS THAT SHOULD BE REPORTED IMMEDIATELY: *FEVER GREATER THAN 100.4 F (38 C) OR HIGHER *CHILLS OR SWEATING *NAUSEA AND VOMITING THAT IS NOT CONTROLLED WITH YOUR NAUSEA MEDICATION *UNUSUAL SHORTNESS OF BREATH *UNUSUAL BRUISING OR BLEEDING *URINARY PROBLEMS (pain or burning when urinating, or frequent urination) *BOWEL PROBLEMS (unusual diarrhea, constipation, pain near the anus) TENDERNESS IN MOUTH AND THROAT WITH OR WITHOUT PRESENCE OF ULCERS (sore throat, sores in mouth, or a toothache) UNUSUAL RASH, SWELLING OR PAIN  UNUSUAL VAGINAL DISCHARGE OR ITCHING   Items with * indicate a potential emergency and should be followed up as soon as possible or go to the Emergency Department if any problems should occur.  Please show the CHEMOTHERAPY ALERT CARD or IMMUNOTHERAPY ALERT CARD at check-in to the Emergency Department and triage nurse.  Should you have questions after your visit or need to cancel or reschedule your appointment, please contact [facility] - A DEPT OF [facility]  Dept: [phone]  and follow the prompts.  Office hours are 8:00 a.m. to 4:30 p.m. Monday - Friday. Please note that voicemails left after 4:00 p.m. may  not be returned until the following business day.  We are closed weekends and major holidays. You have access to a nurse at all times for urgent questions. Please call the main number to the clinic Dept: [phone] and follow the prompts.   For any non-urgent questions, you may also contact your provider using MyChart. We now offer e-Visits for anyone 5 and older to request care online for non-urgent symptoms. For details visit [email or web address].   Also download the MyChart app! Go to the app store, search MyChart, open the app, select [facility], and log in with your MyChart username and password.

## 2024-10-16 NOTE — Progress Notes (Signed)
 Patient presents today for pump stop and Fulphila  injection. Patient's port flushed without difficulty.  Good blood return noted with no bruising or swelling noted at site.  Band aid applied.   Patient tolerated injection with no complaints voiced.  Site clean and dry with no bruising or swelling noted.  No complaints of pain.  Discharged with vital signs stable and no signs or symptoms of distress noted.

## 2024-10-21 ENCOUNTER — Other Ambulatory Visit: Payer: Self-pay | Admitting: Oncology

## 2024-10-28 ENCOUNTER — Inpatient Hospital Stay: Attending: Hematology

## 2024-10-28 ENCOUNTER — Other Ambulatory Visit

## 2024-10-28 ENCOUNTER — Ambulatory Visit

## 2024-10-28 ENCOUNTER — Inpatient Hospital Stay

## 2024-10-28 ENCOUNTER — Inpatient Hospital Stay (HOSPITAL_BASED_OUTPATIENT_CLINIC_OR_DEPARTMENT_OTHER): Admitting: Oncology

## 2024-10-28 ENCOUNTER — Ambulatory Visit: Admitting: Oncology

## 2024-10-28 VITALS — BP 137/67 | HR 71 | Temp 97.7°F | Resp 18

## 2024-10-28 VITALS — BP 121/78 | HR 81 | Temp 98.1°F | Resp 18 | Ht 68.0 in | Wt 163.5 lb

## 2024-10-28 DIAGNOSIS — C787 Secondary malignant neoplasm of liver and intrahepatic bile duct: Secondary | ICD-10-CM

## 2024-10-28 DIAGNOSIS — R739 Hyperglycemia, unspecified: Secondary | ICD-10-CM | POA: Diagnosis not present

## 2024-10-28 DIAGNOSIS — Z5189 Encounter for other specified aftercare: Secondary | ICD-10-CM | POA: Diagnosis not present

## 2024-10-28 DIAGNOSIS — Z800 Family history of malignant neoplasm of digestive organs: Secondary | ICD-10-CM | POA: Insufficient documentation

## 2024-10-28 DIAGNOSIS — C259 Malignant neoplasm of pancreas, unspecified: Secondary | ICD-10-CM | POA: Diagnosis not present

## 2024-10-28 DIAGNOSIS — G629 Polyneuropathy, unspecified: Secondary | ICD-10-CM | POA: Diagnosis not present

## 2024-10-28 DIAGNOSIS — Z794 Long term (current) use of insulin: Secondary | ICD-10-CM | POA: Diagnosis not present

## 2024-10-28 DIAGNOSIS — Z5111 Encounter for antineoplastic chemotherapy: Secondary | ICD-10-CM | POA: Insufficient documentation

## 2024-10-28 DIAGNOSIS — C252 Malignant neoplasm of tail of pancreas: Secondary | ICD-10-CM | POA: Diagnosis present

## 2024-10-28 LAB — CMP (CANCER CENTER ONLY)
ALT: 28 U/L (ref 0–44)
AST: 22 U/L (ref 15–41)
Albumin: 4.2 g/dL (ref 3.5–5.0)
Alkaline Phosphatase: 203 U/L — ABNORMAL HIGH (ref 38–126)
Anion gap: 11 (ref 5–15)
BUN: 17 mg/dL (ref 6–20)
CO2: 26 mmol/L (ref 22–32)
Calcium: 10.1 mg/dL (ref 8.9–10.3)
Chloride: 102 mmol/L (ref 98–111)
Creatinine: 0.83 mg/dL (ref 0.61–1.24)
GFR, Estimated: >60 mL/min (ref >60)
Glucose, Bld: 148 mg/dL — ABNORMAL HIGH (ref 70–99)
Potassium: 4.2 mmol/L (ref 3.5–5.1)
Sodium: 139 mmol/L (ref 135–145)
Total Bilirubin: 0.5 mg/dL (ref 0.0–1.2)
Total Protein: 7 g/dL (ref 6.5–8.1)

## 2024-10-28 LAB — CBC WITH DIFFERENTIAL (CANCER CENTER ONLY)
Abs Immature Granulocytes: 0.04 K/uL (ref 0.00–0.07)
Basophils Absolute: 0 K/uL (ref 0.0–0.1)
Basophils Relative: 0 %
Eosinophils Absolute: 0.1 K/uL (ref 0.0–0.5)
Eosinophils Relative: 3 %
HCT: 36.9 % — ABNORMAL LOW (ref 39.0–52.0)
Hemoglobin: 12.2 g/dL — ABNORMAL LOW (ref 13.0–17.0)
Immature Granulocytes: 1 %
Lymphocytes Relative: 18 %
Lymphs Abs: 0.9 K/uL (ref 0.7–4.0)
MCH: 32.4 pg (ref 26.0–34.0)
MCHC: 33.1 g/dL (ref 30.0–36.0)
MCV: 98.1 fL (ref 80.0–100.0)
Monocytes Absolute: 0.5 K/uL (ref 0.1–1.0)
Monocytes Relative: 10 %
Neutro Abs: 3.3 K/uL (ref 1.7–7.7)
Neutrophils Relative %: 68 %
Platelet Count: 153 K/uL (ref 150–400)
RBC: 3.76 MIL/uL — ABNORMAL LOW (ref 4.22–5.81)
RDW: 13.1 % (ref 11.5–15.5)
WBC Count: 4.8 K/uL (ref 4.0–10.5)
nRBC: 0 % (ref 0.0–0.2)

## 2024-10-28 MED ORDER — SODIUM CHLORIDE 0.9 % IV SOLN
400.0000 mg/m2 | Freq: Once | INTRAVENOUS | Status: AC
  Administered 2024-10-28: 744 mg via INTRAVENOUS
  Filled 2024-10-28: qty 37.2

## 2024-10-28 MED ORDER — SODIUM CHLORIDE 0.9 % IV SOLN: INTRAVENOUS | Status: DC

## 2024-10-28 MED ORDER — PALONOSETRON HCL INJECTION 0.25 MG/5ML
0.2500 mg | Freq: Once | INTRAVENOUS | Status: AC
  Administered 2024-10-28: 0.25 mg via INTRAVENOUS
  Filled 2024-10-28: qty 5

## 2024-10-28 MED ORDER — SODIUM CHLORIDE 0.9 % IV SOLN
150.0000 mg | Freq: Once | INTRAVENOUS | Status: AC
  Administered 2024-10-28: 150 mg via INTRAVENOUS
  Filled 2024-10-28: qty 150

## 2024-10-28 MED ORDER — SODIUM CHLORIDE 0.9 % IV SOLN
2400.0000 mg/m2 | INTRAVENOUS | Status: DC
  Administered 2024-10-28: 4450 mg via INTRAVENOUS
  Filled 2024-10-28: qty 89

## 2024-10-28 MED ORDER — DEXAMETHASONE SODIUM PHOSPHATE 10 MG/ML IJ SOLN
5.0000 mg | Freq: Once | INTRAMUSCULAR | Status: AC
  Administered 2024-10-28: 5 mg via INTRAVENOUS

## 2024-10-28 MED ORDER — SODIUM CHLORIDE 0.9 % IV SOLN
150.0000 mg/m2 | Freq: Once | INTRAVENOUS | Status: AC
  Administered 2024-10-28: 300 mg via INTRAVENOUS
  Filled 2024-10-28: qty 15

## 2024-10-28 MED ORDER — ATROPINE SULFATE 1 MG/ML IV SOLN
0.5000 mg | Freq: Once | INTRAVENOUS | Status: AC | PRN
  Administered 2024-10-28: 0.5 mg via INTRAVENOUS
  Filled 2024-10-28: qty 1

## 2024-10-28 NOTE — Patient Instructions (Signed)
 [facility] - A DEPT OF [facility]  Discharge Instructions: Thank you for choosing [facility] to provide your oncology and hematology care.   If you have a lab appointment with the Cancer Center, please go directly to the Cancer Center and check in at the registration area.   Wear comfortable clothing and clothing appropriate for easy access to any Portacath or PICC line.   We strive to give you quality time with your provider. You may need to reschedule your appointment if you arrive late (15 or more minutes).  Arriving late affects you and other patients whose appointments are after yours.  Also, if you miss three or more appointments without notifying the office, you may be dismissed from the clinic at the provider's discretion.      For prescription refill requests, have your pharmacy contact our office and allow 72 hours for refills to be completed.    Today you received the following chemotherapy and/or immunotherapy agents: leucovorin , irinotecan , fluorouracil        To help prevent nausea and vomiting after your treatment, we encourage you to take your nausea medication as directed.  BELOW ARE SYMPTOMS THAT SHOULD BE REPORTED IMMEDIATELY: *FEVER GREATER THAN 100.4 F (38 C) OR HIGHER *CHILLS OR SWEATING *NAUSEA AND VOMITING THAT IS NOT CONTROLLED WITH YOUR NAUSEA MEDICATION *UNUSUAL SHORTNESS OF BREATH *UNUSUAL BRUISING OR BLEEDING *URINARY PROBLEMS (pain or burning when urinating, or frequent urination) *BOWEL PROBLEMS (unusual diarrhea, constipation, pain near the anus) TENDERNESS IN MOUTH AND THROAT WITH OR WITHOUT PRESENCE OF ULCERS (sore throat, sores in mouth, or a toothache) UNUSUAL RASH, SWELLING OR PAIN  UNUSUAL VAGINAL DISCHARGE OR ITCHING   Items with * indicate a potential emergency and should be followed up as soon as possible or go to the Emergency Department if any problems should occur.  Please show the CHEMOTHERAPY  ALERT CARD or IMMUNOTHERAPY ALERT CARD at check-in to the Emergency Department and triage nurse.  Should you have questions after your visit or need to cancel or reschedule your appointment, please contact [facility] - A DEPT OF [facility]  Dept: [phone]  and follow the prompts.  Office hours are 8:00 a.m. to 4:30 p.m. Monday - Friday. Please note that voicemails left after 4:00 p.m. may not be returned until the following business day.  We are closed weekends and major holidays. You have access to a nurse at all times for urgent questions. Please call the main number to the clinic Dept: [phone] and follow the prompts.   For any non-urgent questions, you may also contact your provider using MyChart. We now offer e-Visits for anyone 29 and older to request care online for non-urgent symptoms. For details visit [email or web address].   Also download the MyChart app! Go to the app store, search MyChart, open the app, select [place], and log in with your MyChart username and password.

## 2024-10-28 NOTE — Progress Notes (Signed)
 Patient seen by Dr. Arley Hof today  Vitals are within treatment parameters:Yes   Labs are within treatment parameters: Yes   Treatment plan has been signed: Yes   Per physician team, Patient is ready for treatment and there are NO modifications to the treatment plan.

## 2024-10-28 NOTE — Progress Notes (Signed)
 [facility] OFFICE PROGRESS NOTE   Diagnosis: Pancreas cancer  INTERVAL HISTORY:   [name] returns as scheduled.  He completed another cycle of FOLFIRI on 10/14/2024.  No nausea/vomiting, mouth sores, or diarrhea.  Neuropathy symptoms in the hands have almost completely resolved.  He has persistent numbness in the toes.  This does not interfere with activity.  He reports a recent increase in his blood sugar.  He was placed on insulin and the hyperglycemia has improved. He underwent a restaging evaluation at [facility] 10/22/2024.  CTs revealed stable disease.  Dr. Wendell recommends continuing FOLFIRI.  Objective:  Vital signs in last 24 hours:  Blood pressure 121/78, pulse 81, temperature 98.1 F (36.7 C), temperature source Temporal, resp. rate 18, height 5' 8 (1.727 m), weight 163 lb 8 oz (74.2 kg), SpO2 100%.    HEENT: No thrush or ulcers Resp: Lungs clear bilaterally Cardio: Regular rate and rhythm GI: No hepatosplenomegaly, no apparent ascites, nontender, no mass Vascular: No leg edema   Portacath/PICC-without erythema  Lab Results:  Lab Results  Component Value Date   WBC 4.8 10/28/2024   HGB 12.2 (L) 10/28/2024   HCT 36.9 (L) 10/28/2024   MCV 98.1 10/28/2024   PLT 153 10/28/2024   NEUTROABS 3.3 10/28/2024    CMP  Lab Results  Component Value Date   NA 139 10/28/2024   K 4.2 10/28/2024   CL 102 10/28/2024   CO2 26 10/28/2024   GLUCOSE 148 (H) 10/28/2024   BUN 17 10/28/2024   CREATININE 0.83 10/28/2024   CALCIUM  10.1 10/28/2024   PROT 7.0 10/28/2024   ALBUMIN 4.2 10/28/2024   AST 22 10/28/2024   ALT 28 10/28/2024   ALKPHOS 203 (H) 10/28/2024   BILITOT 0.5 10/28/2024   GFRNONAA >60 10/28/2024   GFRAA >60 03/26/2018    Lab Results  Component Value Date   CEA 772.45 (H) 05/02/2023   CAN199 74 (H) 10/14/2024     Medications: I have reviewed the patient's current medications.   Assessment/Plan: Pancreas cancer CT angiogram chest  04/24/2023-low-density mass in the dome of the liver with at least 3 additional lesions in both hepatic lobes, borderline lymphadenopathy in the hepatoduodenal ligament Right upper quadrant ultrasound 04/24/2023-large circumscribed mass of the liver dome CT abdomen/pelvis 04/24/2023-multiple liver lesions poorly defined on noncontrast exam, prominent portacaval and porta hepatis nodes MRI abdomen 05/05/2023-multiple rim-hypoenhancing liver lesions consistent with hepatic metastases, suspicion for a pancreas tail mass Ultrasound-guided biopsy of dominant right liver lesion 05/10/2023-adenocarcinoma, CK7 and CDX2 positive, abundant cytoplasm and mucin with extracellular mucin, foci of lymphovascular invasion Tempus gene panel-K-ras G12V, T P53 mild tumor mutation burden 3.2, MSS PET 06/04/2023-multiple hypermetabolic liver lesions consistent with metastases, low-level FDG uptake in the soft tissue fullness at the tip of the pancreas tail, low-level FDG activity involving a small soft tissue nodule between the gastric fundus and spleen, tiny foci of accumulation at the right costovertebral junction at T10 and roof of the left acetabulum without an underlying CT lesion Elevated CEA and CA 19-9 EUS 06/07/2023 ,22x 15 mm irregular mass in the tail the pancreas, 11 mm subcarinal lymph node, FNA biopsy of the pancreas mass-suspicious for malignancy Cycle 1 FOLFIRINOX 06/19/2023 Cycle 2 FOLFIRINOX 07/03/2023 Cycle 3 FOLFIRINOX 07/17/2023 Cycle 4 FOLFIRINOX 07/31/2023 Cycle 5 FOLFIRINOX 08/14/2023, oxaliplatin  and Decadron  dose reduced Cycle 6 FOLFIRINOX 08/28/2023 Cycle 7 FOLFIRINOX 09/11/2023 CTs at [facility] 09/16/2023, compared to 04/24/2023-unchanged pancreas tail mass, decrease in attenuation of hepatic metastases, several lesions have decreased in  size, no new lesions Cycle 8 FOLFIRINOX 09/25/2023 Cycle 9 FOLFIRINOX 10/09/2023 Cycle 10 FOLFIRINOX 10/22/2023 Cycle 11 FOLFIRINOX 11/05/2023 Cycle 12 FOLFIRINOX 11/20/2023 Cycle  13 FOLFIRINOX 12/04/2023 Cycle 14 FOLFIRINOX 12/19/2023 CTs at [facility] 12/23/2023: Stable to slight decrease in hepatic metastases, no new lesions, stable pancreas tail mass MRI liver at [facility] 12/26/2023: Multifocal nonenhancing hepatic metastases, pancreas tail mass not well-visualized Cycle 15 FOLFIRINOX 01/01/2024, 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 Cycle 16 FOLFIRINOX 01/15/2024, 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 Cycle 17 FOLFIRINOX 02/05/2024, 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 Cycle 18 FOLFIRINOX 02/19/2024, 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 Cycle 19 FOLFIRINOX 03/04/2024, 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 Cycle 20 FOLFIRINOX 03/18/2024, 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 CTs at [facility] 03/27/2024: Stable hepatic metastases, no new hepatic lesion, pancreas tail lesion not well-visualized unchanged right lower lobe groundglass opacity Cycle 21 FOLFIRINOX 04/01/2024, 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 Cycle 22 FOLFIRINOX 04/15/2024, 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 Cycle 23 FOLFIRINOX 04/29/2024, 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 Cycle 24 FOLFIRINOX 05/20/2024, 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 Cycle 25 FOLFIRINOX 06/11/2024, 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 Cycle 26 FOLFIRINOX 06/25/2024, 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 Cycle 27 FOLFIRINOX 07/08/2024, 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 CTs at [facility] 07/14/2024: Stable treated hepatic metastatic disease, pancreas tail lesion not visualized Cycle 28 FOLFIRINOX 07/22/2024, 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 Cycle 29 FOLFIRINOX 08/05/2024, 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 Cycle 30 FOLFIRINOX 08/19/2024, 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 Cycle 31 FOLFIRINOX 09/02/2024, 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 Cycle 32 FOLFIRINOX 09/23/2024, 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 Cycle 33 FOLFIRINOX 10/14/2024, 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 CTs at [facility]  10/22/2024: Unchanged hepatic metastatic disease, pancreas tail lesion not well-visualized, no evidence of disease progression Cycle 34 FOLFIRINOX 10/28/2024, 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  Resolved with Reglan .  Recurrent hiccups following cycle 4 FOLFIRINOX-not relieved with Reglan        Disposition: [name] appears stable.  The restaging evaluation at [facility] revealed stable disease.  Dr. Wendell recommends continuing FOLFIRI.  He will complete another cycle FOLFIRI today.  The CA 19-9 has been slightly higher over the past few months.  The plan is to obtain a short interval follow-up CT if the CA 19-9 rises significantly beyond the current level.  We discussed salvage treatment options including resuming oxaliplatin , gemcitabine/Abraxane, and hepatic directed therapy.  Mr. Wickliff will return for an office visit in the next cycle of chemotherapy in 3 weeks.  Arley Hof, MD  10/28/2024  10:08 AM

## 2024-10-28 NOTE — Progress Notes (Signed)
 Patients port flushed without difficulty.  Good blood return noted with no bruising or swelling noted at site. Patient remains accessed for treatment.

## 2024-10-29 ENCOUNTER — Other Ambulatory Visit: Payer: Self-pay

## 2024-10-29 LAB — CANCER ANTIGEN 19-9: CA 19-9: 71 U/mL — ABNORMAL HIGH (ref 0–35)

## 2024-10-30 ENCOUNTER — Inpatient Hospital Stay

## 2024-10-30 ENCOUNTER — Encounter

## 2024-10-30 ENCOUNTER — Other Ambulatory Visit: Payer: Self-pay

## 2024-10-30 VITALS — BP 144/81 | HR 74 | Temp 98.6°F | Resp 18

## 2024-10-30 DIAGNOSIS — Z5111 Encounter for antineoplastic chemotherapy: Secondary | ICD-10-CM | POA: Diagnosis not present

## 2024-10-30 DIAGNOSIS — C259 Malignant neoplasm of pancreas, unspecified: Secondary | ICD-10-CM

## 2024-10-30 MED ORDER — PEGFILGRASTIM-JMDB 6 MG/0.6ML ~~LOC~~ SOSY
6.0000 mg | PREFILLED_SYRINGE | Freq: Once | SUBCUTANEOUS | Status: AC
  Administered 2024-10-30: 6 mg via SUBCUTANEOUS
  Filled 2024-10-30: qty 0.6

## 2024-10-30 NOTE — Patient Instructions (Signed)

## 2024-11-03 ENCOUNTER — Telehealth: Payer: Self-pay | Admitting: *Deleted

## 2024-11-03 ENCOUNTER — Encounter: Payer: Self-pay | Admitting: Oncology

## 2024-11-03 NOTE — Telephone Encounter (Signed)
 Completed FMLA WH-[id number] paperwork   Sent to provider to review, amend, sign and return to this nurse to return to claims benefit manager.

## 2024-11-04 NOTE — Telephone Encounter (Signed)
 Paperwork received signed by provider.  Returned to the [place] [employer] [place] via fax [phone]).  No record request received.   Form to [facility] HIM bin for items designated to be scanned.  Form prepared to be mailed to address on file. [address] [place] [id number] Form process completed with no further instructions received, actions performed or required by this nurse.

## 2024-11-14 ENCOUNTER — Other Ambulatory Visit: Payer: Self-pay | Admitting: Oncology

## 2024-11-14 DIAGNOSIS — C259 Malignant neoplasm of pancreas, unspecified: Secondary | ICD-10-CM

## 2024-11-18 ENCOUNTER — Inpatient Hospital Stay: Admitting: Nurse Practitioner

## 2024-11-18 ENCOUNTER — Inpatient Hospital Stay

## 2024-11-18 ENCOUNTER — Inpatient Hospital Stay: Attending: Hematology

## 2024-11-18 ENCOUNTER — Encounter: Payer: Self-pay | Admitting: Nurse Practitioner

## 2024-11-18 ENCOUNTER — Other Ambulatory Visit

## 2024-11-18 VITALS — BP 125/81 | HR 82 | Temp 97.7°F | Resp 16 | Wt 163.8 lb

## 2024-11-18 VITALS — BP 138/82 | HR 72 | Temp 98.2°F | Resp 18

## 2024-11-18 DIAGNOSIS — C252 Malignant neoplasm of tail of pancreas: Secondary | ICD-10-CM | POA: Insufficient documentation

## 2024-11-18 DIAGNOSIS — C787 Secondary malignant neoplasm of liver and intrahepatic bile duct: Secondary | ICD-10-CM | POA: Diagnosis present

## 2024-11-18 DIAGNOSIS — R066 Hiccough: Secondary | ICD-10-CM | POA: Insufficient documentation

## 2024-11-18 DIAGNOSIS — K5900 Constipation, unspecified: Secondary | ICD-10-CM | POA: Diagnosis not present

## 2024-11-18 DIAGNOSIS — C259 Malignant neoplasm of pancreas, unspecified: Secondary | ICD-10-CM

## 2024-11-18 DIAGNOSIS — Z5111 Encounter for antineoplastic chemotherapy: Secondary | ICD-10-CM | POA: Diagnosis present

## 2024-11-18 DIAGNOSIS — Z800 Family history of malignant neoplasm of digestive organs: Secondary | ICD-10-CM | POA: Insufficient documentation

## 2024-11-18 DIAGNOSIS — G629 Polyneuropathy, unspecified: Secondary | ICD-10-CM | POA: Diagnosis not present

## 2024-11-18 DIAGNOSIS — Z5189 Encounter for other specified aftercare: Secondary | ICD-10-CM | POA: Diagnosis not present

## 2024-11-18 LAB — CBC WITH DIFFERENTIAL (CANCER CENTER ONLY)
Abs Immature Granulocytes: 0.11 K/uL — ABNORMAL HIGH (ref 0.00–0.07)
Basophils Absolute: 0.1 K/uL (ref 0.0–0.1)
Basophils Relative: 1 %
Eosinophils Absolute: 0.1 K/uL (ref 0.0–0.5)
Eosinophils Relative: 2 %
HCT: 35.5 % — ABNORMAL LOW (ref 39.0–52.0)
Hemoglobin: 12 g/dL — ABNORMAL LOW (ref 13.0–17.0)
Immature Granulocytes: 2 %
Lymphocytes Relative: 17 %
Lymphs Abs: 1 K/uL (ref 0.7–4.0)
MCH: 32.7 pg (ref 26.0–34.0)
MCHC: 33.8 g/dL (ref 30.0–36.0)
MCV: 96.7 fL (ref 80.0–100.0)
Monocytes Absolute: 0.6 K/uL (ref 0.1–1.0)
Monocytes Relative: 10 %
Neutro Abs: 4 K/uL (ref 1.7–7.7)
Neutrophils Relative %: 68 %
Platelet Count: 205 K/uL (ref 150–400)
RBC: 3.67 MIL/uL — ABNORMAL LOW (ref 4.22–5.81)
RDW: 13.4 % (ref 11.5–15.5)
WBC Count: 5.8 K/uL (ref 4.0–10.5)
nRBC: 0 % (ref 0.0–0.2)

## 2024-11-18 LAB — CMP (CANCER CENTER ONLY)
ALT: 30 U/L (ref 0–44)
AST: 31 U/L (ref 15–41)
Albumin: 4.3 g/dL (ref 3.5–5.0)
Alkaline Phosphatase: 222 U/L — ABNORMAL HIGH (ref 38–126)
Anion gap: 13 (ref 5–15)
BUN: 20 mg/dL (ref 6–20)
CO2: 23 mmol/L (ref 22–32)
Calcium: 9.8 mg/dL (ref 8.9–10.3)
Chloride: 103 mmol/L (ref 98–111)
Creatinine: 0.89 mg/dL (ref 0.61–1.24)
GFR, Estimated: >60 mL/min (ref >60)
Glucose, Bld: 217 mg/dL — ABNORMAL HIGH (ref 70–99)
Potassium: 4.5 mmol/L (ref 3.5–5.1)
Sodium: 138 mmol/L (ref 135–145)
Total Bilirubin: 0.3 mg/dL (ref 0.0–1.2)
Total Protein: 7.1 g/dL (ref 6.5–8.1)

## 2024-11-18 MED ORDER — PALONOSETRON HCL INJECTION 0.25 MG/5ML
0.2500 mg | Freq: Once | INTRAVENOUS | Status: AC
  Administered 2024-11-18: 0.25 mg via INTRAVENOUS
  Filled 2024-11-18: qty 5

## 2024-11-18 MED ORDER — SODIUM CHLORIDE 0.9 % IV SOLN
150.0000 mg | Freq: Once | INTRAVENOUS | Status: AC
  Administered 2024-11-18: 150 mg via INTRAVENOUS
  Filled 2024-11-18: qty 150

## 2024-11-18 MED ORDER — SODIUM CHLORIDE 0.9 % IV SOLN: INTRAVENOUS | Status: DC

## 2024-11-18 MED ORDER — SODIUM CHLORIDE 0.9 % IV SOLN
2400.0000 mg/m2 | INTRAVENOUS | Status: DC
  Administered 2024-11-18: 4450 mg via INTRAVENOUS
  Filled 2024-11-18: qty 89

## 2024-11-18 MED ORDER — ALPRAZOLAM 0.5 MG PO TABS: 0.5000 mg | ORAL_TABLET | Freq: Every evening | ORAL | 0 refills | Status: AC | PRN

## 2024-11-18 MED ORDER — SODIUM CHLORIDE 0.9 % IV SOLN
400.0000 mg/m2 | Freq: Once | INTRAVENOUS | Status: AC
  Administered 2024-11-18: 744 mg via INTRAVENOUS
  Filled 2024-11-18: qty 37.2

## 2024-11-18 MED ORDER — DEXAMETHASONE SODIUM PHOSPHATE 10 MG/ML IJ SOLN
5.0000 mg | Freq: Once | INTRAMUSCULAR | Status: AC
  Administered 2024-11-18: 5 mg via INTRAVENOUS

## 2024-11-18 MED ORDER — ATROPINE SULFATE 1 MG/ML IV SOLN
0.5000 mg | Freq: Once | INTRAVENOUS | Status: AC | PRN
  Administered 2024-11-18: 0.5 mg via INTRAVENOUS
  Filled 2024-11-18: qty 1

## 2024-11-18 MED ORDER — SODIUM CHLORIDE 0.9 % IV SOLN
150.0000 mg/m2 | Freq: Once | INTRAVENOUS | Status: AC
  Administered 2024-11-18: 300 mg via INTRAVENOUS
  Filled 2024-11-18: qty 15

## 2024-11-18 NOTE — Progress Notes (Signed)
  [facility] OFF[facility] PROGRESS [facility]   Diagnosis: Pancreas cancer  INTERVAL HISTORY:   David [name] returns as scheduled.  He completed another cycle of FOLFIRI 10/28/2024.  He denies nausea/vomiting.  No mouth sores.  No diarrhea.  He forgot to take his take hiccup medication the night of treatment.  He had hiccups for about 5 hours the following day.  Minimal neuropathy symptoms in the hands.  Persistent numbness in the toes.  Objective:  Vital signs in last 24 hours:  Blood pressure 125/81, pulse 82, temperature 97.7 F (36.5 C), temperature source Temporal, resp. rate 16, weight 163 lb 12.8 oz (74.3 kg), SpO2 100%.    HEENT: No thrush or ulcers. Resp: Lungs clear bilaterally. Cardio: Regular rate and rhythm. GI: No hepatosplenomegaly.  No mass.  Nontender. Vascular: No leg edema. Skin: Palms without erythema. Port-A-Cath without erythema.  Lab Results:  Lab Results  Component Value Date   WBC 4.8 10/28/2024   HGB 12.2 (L) 10/28/2024   HCT 36.9 (L) 10/28/2024   MCV 98.1 10/28/2024   PLT 153 10/28/2024   NEUTROABS 3.3 10/28/2024    Imaging:  No results found.  Medications: I have reviewed the patient's current medications.  Assessment/Plan: Pancreas cancer CT angiogram chest 04/24/2023-low-density mass in the dome of the liver with at least 3 additional lesions in both hepatic lobes, borderline lymphadenopathy in the hepatoduodenal ligament Right upper quadrant ultrasound 04/24/2023-large circumscribed mass of the liver dome CT abdomen/pelvis 04/24/2023-multiple liver lesions poorly defined on noncontrast exam, prominent portacaval and porta hepatis nodes MRI abdomen 05/05/2023-multiple rim-hypoenhancing liver lesions consistent with hepatic metastases, suspicion for a pancreas tail mass Ultrasound-guided biopsy of dominant right liver lesion 05/10/2023-adenocarcinoma, CK7 and CDX2 positive, abundant cytoplasm and mucin with extracellular mucin, foci of  lymphovascular invasion Tempus gene panel-K-ras G12V, T P53 mild tumor mutation burden 3.2, MSS PET 06/04/2023-multiple hypermetabolic liver lesions consistent with metastases, low-level FDG uptake in the soft tissue fullness at the tip of the pancreas tail, low-level FDG activity involving a small soft tissue nodule between the gastric fundus and spleen, tiny foci of accumulation at the right costovertebral junction at T10 and roof of the left acetabulum without an underlying CT lesion Elevated CEA and CA 19-9 EUS 06/07/2023 ,22x 15 mm irregular mass in the tail the pancreas, 11 mm subcarinal lymph node, FNA biopsy of the pancreas mass-suspicious for malignancy Cycle 1 FOLFIRINOX 06/19/2023 Cycle 2 FOLFIRINOX 07/03/2023 Cycle 3 FOLFIRINOX 07/17/2023 Cycle 4 FOLFIRINOX 07/31/2023 Cycle 5 FOLFIRINOX 08/14/2023, oxaliplatin  and Decadron  dose reduced Cycle 6 FOLFIRINOX 08/28/2023 Cycle 7 FOLFIRINOX 09/11/2023 CTs at [facility] 09/16/2023, compared to 04/24/2023-unchanged pancreas tail mass, decrease in attenuation of hepatic metastases, several lesions have decreased in size, no new lesions Cycle 8 FOLFIRINOX 09/25/2023 Cycle 9 FOLFIRINOX 10/09/2023 Cycle 10 FOLFIRINOX 10/22/2023 Cycle 11 FOLFIRINOX 11/05/2023 Cycle 12 FOLFIRINOX 11/20/2023 Cycle 13 FOLFIRINOX 12/04/2023 Cycle 14 FOLFIRINOX 12/19/2023 CTs at [facility] 12/23/2023: Stable to slight decrease in hepatic metastases, no new lesions, stable pancreas tail mass MRI liver at [facility] 12/26/2023: Multifocal nonenhancing hepatic metastases, pancreas tail mass not well-visualized Cycle 15 FOLFIRINOX 01/01/2024, 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 Cycle 16 FOLFIRINOX 01/15/2024, 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 Cycle 17 FOLFIRINOX 02/05/2024, 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 Cycle 18 FOLFIRINOX 02/19/2024, 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 Cycle 19 FOLFIRINOX 03/04/2024, 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 Cycle 20 FOLFIRINOX 03/18/2024, oxaliplatin   held secondary to neuropathy CTs at [facility] 03/27/2024: Stable hepatic metastases, no new hepatic lesion, pancreas tail lesion not well-visualized unchanged right lower  lobe groundglass opacity Cycle 21 FOLFIRINOX 04/01/2024, 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 Cycle 22 FOLFIRINOX 04/15/2024, 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 Cycle 23 FOLFIRINOX 04/29/2024, 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 Cycle 24 FOLFIRINOX 05/20/2024, 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 Cycle 25 FOLFIRINOX 06/11/2024, 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 Cycle 26 FOLFIRINOX 06/25/2024, 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 Cycle 27 FOLFIRINOX 07/08/2024, 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 CTs at [facility] 07/14/2024: Stable treated hepatic metastatic disease, pancreas tail lesion not visualized Cycle 28 FOLFIRINOX 07/22/2024, 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 Cycle 29 FOLFIRINOX 08/05/2024, 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 Cycle 30 FOLFIRINOX 08/19/2024, 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 Cycle 31 FOLFIRINOX 09/02/2024, 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 Cycle 32 FOLFIRINOX 09/23/2024, 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 Cycle 33 FOLFIRINOX 10/14/2024, 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 CTs at [facility] 10/22/2024: Unchanged hepatic metastatic disease, pancreas tail lesion not well-visualized, no evidence of disease progression Cycle 34 FOLFIRINOX 10/28/2024, 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 Cycle 35 FOLFIRINOX 11/18/2024, 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  Resolved with Reglan .  Recurrent hiccups following cycle 4 FOLFIRINOX-not relieved with Reglan       Disposition: David [name] appears stable.  He continues FOLFIRI.  He is tolerating well.  There is no clinical evidence of disease progression.  Most recent CA 19-9  was stable.  Plan to proceed with another cycle today.  CBC and chemistry panel reviewed.  Labs adequate for treatment.  He will return for follow-up and the next cycle of FOLFIRI in 2 weeks.  We are available to see him sooner if needed.    Olam Ned ANP/GNP-BC   11/18/2024  10:02 AM

## 2024-11-18 NOTE — Progress Notes (Signed)
 Patient seen by Olam Ned NP today  Vitals are within treatment parameters:Yes   Labs are within treatment parameters: Yes   Treatment plan has been signed: Yes   Per physician team, Patient is ready for treatment and there are NO modifications to the treatment plan.

## 2024-11-18 NOTE — Patient Instructions (Signed)
 [facility] - A DEPT OF [facility]  Discharge Instructions: Thank you for choosing [facility] to provide your oncology and hematology care.   If you have a lab appointment with the Cancer Center, please go directly to the Cancer Center and check in at the registration area.   Wear comfortable clothing and clothing appropriate for easy access to any Portacath or PICC line.   We strive to give you quality time with your provider. You may need to reschedule your appointment if you arrive late (15 or more minutes).  Arriving late affects you and other patients whose appointments are after yours.  Also, if you miss three or more appointments without notifying the office, you may be dismissed from the clinic at the provider's discretion.      For prescription refill requests, have your pharmacy contact our office and allow 72 hours for refills to be completed.    Today you received the following chemotherapy and/or immunotherapy agents: leucovorin , irinotecan , fluorouracil        To help prevent nausea and vomiting after your treatment, we encourage you to take your nausea medication as directed.  BELOW ARE SYMPTOMS THAT SHOULD BE REPORTED IMMEDIATELY: *FEVER GREATER THAN 100.4 F (38 C) OR HIGHER *CHILLS OR SWEATING *NAUSEA AND VOMITING THAT IS NOT CONTROLLED WITH YOUR NAUSEA MEDICATION *UNUSUAL SHORTNESS OF BREATH *UNUSUAL BRUISING OR BLEEDING *URINARY PROBLEMS (pain or burning when urinating, or frequent urination) *BOWEL PROBLEMS (unusual diarrhea, constipation, pain near the anus) TENDERNESS IN MOUTH AND THROAT WITH OR WITHOUT PRESENCE OF ULCERS (sore throat, sores in mouth, or a toothache) UNUSUAL RASH, SWELLING OR PAIN  UNUSUAL VAGINAL DISCHARGE OR ITCHING   Items with * indicate a potential emergency and should be followed up as soon as possible or go to the Emergency Department if any problems should occur.  Please show the CHEMOTHERAPY  ALERT CARD or IMMUNOTHERAPY ALERT CARD at check-in to the Emergency Department and triage nurse.  Should you have questions after your visit or need to cancel or reschedule your appointment, please contact [facility] - A DEPT OF [facility]  Dept: [phone]  and follow the prompts.  Office hours are 8:00 a.m. to 4:30 p.m. Monday - Friday. Please note that voicemails left after 4:00 p.m. may not be returned until the following business day.  We are closed weekends and major holidays. You have access to a nurse at all times for urgent questions. Please call the main number to the clinic Dept: [phone] and follow the prompts.   For any non-urgent questions, you may also contact your provider using MyChart. We now offer e-Visits for anyone 29 and older to request care online for non-urgent symptoms. For details visit [email or web address].   Also download the MyChart app! Go to the app store, search MyChart, open the app, select [place], and log in with your MyChart username and password.

## 2024-11-19 ENCOUNTER — Other Ambulatory Visit: Payer: Self-pay

## 2024-11-19 LAB — CANCER ANTIGEN 19-9: CA 19-9: 79 U/mL — ABNORMAL HIGH (ref 0–35)

## 2024-11-20 ENCOUNTER — Inpatient Hospital Stay

## 2024-11-20 VITALS — BP 138/89 | HR 75 | Temp 98.0°F

## 2024-11-20 DIAGNOSIS — C259 Malignant neoplasm of pancreas, unspecified: Secondary | ICD-10-CM

## 2024-11-20 DIAGNOSIS — Z5111 Encounter for antineoplastic chemotherapy: Secondary | ICD-10-CM | POA: Diagnosis not present

## 2024-11-20 MED ORDER — PEGFILGRASTIM-JMDB 6 MG/0.6ML ~~LOC~~ SOSY
6.0000 mg | PREFILLED_SYRINGE | Freq: Once | SUBCUTANEOUS | Status: AC
  Administered 2024-11-20: 6 mg via SUBCUTANEOUS
  Filled 2024-11-20: qty 0.6

## 2024-11-20 NOTE — Patient Instructions (Signed)

## 2024-11-28 ENCOUNTER — Other Ambulatory Visit: Payer: Self-pay | Admitting: Oncology

## 2024-11-28 DIAGNOSIS — C787 Secondary malignant neoplasm of liver and intrahepatic bile duct: Secondary | ICD-10-CM

## 2024-12-01 ENCOUNTER — Encounter: Payer: Self-pay | Admitting: Oncology

## 2024-12-02 ENCOUNTER — Inpatient Hospital Stay: Admitting: Nurse Practitioner

## 2024-12-02 ENCOUNTER — Inpatient Hospital Stay

## 2024-12-02 ENCOUNTER — Encounter: Payer: Self-pay | Admitting: Nurse Practitioner

## 2024-12-02 VITALS — BP 148/84 | HR 75 | Temp 98.0°F | Resp 17

## 2024-12-02 VITALS — BP 133/88 | HR 69 | Temp 98.0°F | Resp 16 | Wt 166.4 lb

## 2024-12-02 DIAGNOSIS — C259 Malignant neoplasm of pancreas, unspecified: Secondary | ICD-10-CM | POA: Diagnosis not present

## 2024-12-02 DIAGNOSIS — C787 Secondary malignant neoplasm of liver and intrahepatic bile duct: Secondary | ICD-10-CM

## 2024-12-02 DIAGNOSIS — Z5111 Encounter for antineoplastic chemotherapy: Secondary | ICD-10-CM | POA: Diagnosis not present

## 2024-12-02 LAB — CBC WITH DIFFERENTIAL (CANCER CENTER ONLY)
Abs Immature Granulocytes: 0.23 K/uL — ABNORMAL HIGH (ref 0.00–0.07)
Basophils Absolute: 0 K/uL (ref 0.0–0.1)
Basophils Relative: 1 %
Eosinophils Absolute: 0.1 K/uL (ref 0.0–0.5)
Eosinophils Relative: 2 %
HCT: 33.9 % — ABNORMAL LOW (ref 39.0–52.0)
Hemoglobin: 11.1 g/dL — ABNORMAL LOW (ref 13.0–17.0)
Immature Granulocytes: 4 %
Lymphocytes Relative: 14 %
Lymphs Abs: 0.8 K/uL (ref 0.7–4.0)
MCH: 31.3 pg (ref 26.0–34.0)
MCHC: 32.7 g/dL (ref 30.0–36.0)
MCV: 95.5 fL (ref 80.0–100.0)
Monocytes Absolute: 0.4 K/uL (ref 0.1–1.0)
Monocytes Relative: 8 %
Neutro Abs: 4.3 K/uL (ref 1.7–7.7)
Neutrophils Relative %: 71 %
Platelet Count: 142 K/uL — ABNORMAL LOW (ref 150–400)
RBC: 3.55 MIL/uL — ABNORMAL LOW (ref 4.22–5.81)
RDW: 13.6 % (ref 11.5–15.5)
WBC Count: 5.9 K/uL (ref 4.0–10.5)
nRBC: 0 % (ref 0.0–0.2)

## 2024-12-02 LAB — CMP (CANCER CENTER ONLY)
ALT: 32 U/L (ref 0–44)
AST: 27 U/L (ref 15–41)
Albumin: 4.3 g/dL (ref 3.5–5.0)
Alkaline Phosphatase: 210 U/L — ABNORMAL HIGH (ref 38–126)
Anion gap: 10 (ref 5–15)
BUN: 14 mg/dL (ref 6–20)
CO2: 25 mmol/L (ref 22–32)
Calcium: 9.8 mg/dL (ref 8.9–10.3)
Chloride: 103 mmol/L (ref 98–111)
Creatinine: 0.76 mg/dL (ref 0.61–1.24)
GFR, Estimated: >60 mL/min (ref >60)
Glucose, Bld: 153 mg/dL — ABNORMAL HIGH (ref 70–99)
Potassium: 4.3 mmol/L (ref 3.5–5.1)
Sodium: 138 mmol/L (ref 135–145)
Total Bilirubin: 0.4 mg/dL (ref 0.0–1.2)
Total Protein: 6.9 g/dL (ref 6.5–8.1)

## 2024-12-02 MED ORDER — SODIUM CHLORIDE 0.9 % IV SOLN
2400.0000 mg/m2 | INTRAVENOUS | Status: DC
  Administered 2024-12-02: 14:00:00 4450 mg via INTRAVENOUS
  Filled 2024-12-02: qty 89

## 2024-12-02 MED ORDER — ATROPINE SULFATE 1 MG/ML IV SOLN
0.5000 mg | Freq: Once | INTRAVENOUS | Status: AC | PRN
  Administered 2024-12-02: 12:00:00 0.5 mg via INTRAVENOUS
  Filled 2024-12-02: qty 1

## 2024-12-02 MED ORDER — SODIUM CHLORIDE 0.9 % IV SOLN: INTRAVENOUS | Status: DC

## 2024-12-02 MED ORDER — SODIUM CHLORIDE 0.9 % IV SOLN
150.0000 mg/m2 | Freq: Once | INTRAVENOUS | Status: AC
  Administered 2024-12-02: 13:00:00 300 mg via INTRAVENOUS
  Filled 2024-12-02: qty 15

## 2024-12-02 MED ORDER — SODIUM CHLORIDE 0.9 % IV SOLN
150.0000 mg | Freq: Once | INTRAVENOUS | Status: AC
  Administered 2024-12-02: 11:00:00 150 mg via INTRAVENOUS
  Filled 2024-12-02: qty 150

## 2024-12-02 MED ORDER — PALONOSETRON HCL INJECTION 0.25 MG/5ML
0.2500 mg | Freq: Once | INTRAVENOUS | Status: AC
  Administered 2024-12-02: 11:00:00 0.25 mg via INTRAVENOUS
  Filled 2024-12-02: qty 5

## 2024-12-02 MED ORDER — DEXAMETHASONE SODIUM PHOSPHATE 10 MG/ML IJ SOLN
5.0000 mg | Freq: Once | INTRAMUSCULAR | Status: AC
  Administered 2024-12-02: 11:00:00 5 mg via INTRAVENOUS

## 2024-12-02 MED ORDER — SODIUM CHLORIDE 0.9 % IV SOLN
400.0000 mg/m2 | Freq: Once | INTRAVENOUS | Status: AC
  Administered 2024-12-02: 13:00:00 744 mg via INTRAVENOUS
  Filled 2024-12-02: qty 37.2

## 2024-12-02 NOTE — Progress Notes (Signed)
 [facility] OFFICE PROGRESS NOTE   Diagnosis: Pancreas cancer  INTERVAL HISTORY:   David [name] returns as scheduled.  He completed another cycle of FOLFIRI 11/18/2024.  He denies nausea/vomiting.  No mouth sores.  No diarrhea.  He notes mild constipation after treatment.  Stable neuropathy symptoms.  He had hiccups for about 6 to 8 hours following most recent chemotherapy.  No abdominal pain.  Objective:  Vital signs in last 24 hours:  Blood pressure 133/88, pulse 69, temperature 98 F (36.7 C), temperature source Temporal, resp. rate 16, weight 166 lb 6.4 oz (75.5 kg), SpO2 100%.    HEENT: No thrush or ulcers. Resp: Lungs clear bilaterally. Cardio: Regular rate and rhythm. GI: No hepatosplenomegaly.  Nontender. Vascular: No leg edema. Skin: Palms without erythema. Port-A-Cath without erythema.  Lab Results:  Lab Results  Component Value Date   WBC 5.9 12/02/2024   HGB 11.1 (L) 12/02/2024   HCT 33.9 (L) 12/02/2024   MCV 95.5 12/02/2024   PLT 142 (L) 12/02/2024   NEUTROABS 4.3 12/02/2024    Imaging:  No results found.  Medications: I have reviewed the patient's current medications.  Assessment/Plan: Pancreas cancer CT angiogram chest 04/24/2023-low-density mass in the dome of the liver with at least 3 additional lesions in both hepatic lobes, borderline lymphadenopathy in the hepatoduodenal ligament Right upper quadrant ultrasound 04/24/2023-large circumscribed mass of the liver dome CT abdomen/pelvis 04/24/2023-multiple liver lesions poorly defined on noncontrast exam, prominent portacaval and porta hepatis nodes MRI abdomen 05/05/2023-multiple rim-hypoenhancing liver lesions consistent with hepatic metastases, suspicion for a pancreas tail mass Ultrasound-guided biopsy of dominant right liver lesion 05/10/2023-adenocarcinoma, CK7 and CDX2 positive, abundant cytoplasm and mucin with extracellular mucin, foci of lymphovascular invasion Tempus gene panel-K-ras  G12V, T P53 mild tumor mutation burden 3.2, MSS PET 06/04/2023-multiple hypermetabolic liver lesions consistent with metastases, low-level FDG uptake in the soft tissue fullness at the tip of the pancreas tail, low-level FDG activity involving a small soft tissue nodule between the gastric fundus and spleen, tiny foci of accumulation at the right costovertebral junction at T10 and roof of the left acetabulum without an underlying CT lesion Elevated CEA and CA 19-9 EUS 06/07/2023 ,22x 15 mm irregular mass in the tail the pancreas, 11 mm subcarinal lymph node, FNA biopsy of the pancreas mass-suspicious for malignancy Cycle 1 FOLFIRINOX 06/19/2023 Cycle 2 FOLFIRINOX 07/03/2023 Cycle 3 FOLFIRINOX 07/17/2023 Cycle 4 FOLFIRINOX 07/31/2023 Cycle 5 FOLFIRINOX 08/14/2023, oxaliplatin  and Decadron  dose reduced Cycle 6 FOLFIRINOX 08/28/2023 Cycle 7 FOLFIRINOX 09/11/2023 CTs at [facility] 09/16/2023, compared to 04/24/2023-unchanged pancreas tail mass, decrease in attenuation of hepatic metastases, several lesions have decreased in size, no new lesions Cycle 8 FOLFIRINOX 09/25/2023 Cycle 9 FOLFIRINOX 10/09/2023 Cycle 10 FOLFIRINOX 10/22/2023 Cycle 11 FOLFIRINOX 11/05/2023 Cycle 12 FOLFIRINOX 11/20/2023 Cycle 13 FOLFIRINOX 12/04/2023 Cycle 14 FOLFIRINOX 12/19/2023 CTs at [facility] 12/23/2023: Stable to slight decrease in hepatic metastases, no new lesions, stable pancreas tail mass MRI liver at [facility] 12/26/2023: Multifocal nonenhancing hepatic metastases, pancreas tail mass not well-visualized Cycle 15 FOLFIRINOX 01/01/2024, 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 Cycle 16 FOLFIRINOX 01/15/2024, 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 Cycle 17 FOLFIRINOX 02/05/2024, oxaliplatin  held secondary to neuropathy Cycle 18 FOLFIRINOX 02/19/2024, oxaliplatin  held secondary to neuropathy Cycle 19 FOLFIRINOX 03/04/2024, oxaliplatin  held secondary to neuropathy Cycle 20 FOLFIRINOX 03/18/2024, oxaliplatin  held secondary to neuropathy CTs at [facility]  03/27/2024: Stable hepatic metastases, no new hepatic lesion, pancreas tail lesion not well-visualized unchanged right lower lobe groundglass opacity Cycle 21 FOLFIRINOX 04/01/2024, oxaliplatin  held secondary  to neuropathy Cycle 22 FOLFIRINOX 04/15/2024, oxaliplatin  held secondary to neuropathy Cycle 23 FOLFIRINOX 04/29/2024, oxaliplatin  held secondary to neuropathy Cycle 24 FOLFIRINOX 05/20/2024, oxaliplatin  held secondary to neuropathy Cycle 25 FOLFIRINOX 06/11/2024, oxaliplatin  held secondary to neuropathy Cycle 26 FOLFIRINOX 06/25/2024, oxaliplatin  held secondary to neuropathy Cycle 27 FOLFIRINOX 07/08/2024, oxaliplatin  held secondary to neuropathy CTs at [facility] 07/14/2024: Stable treated hepatic metastatic disease, pancreas tail lesion not visualized Cycle 28 FOLFIRINOX 07/22/2024, oxaliplatin  held secondary to neuropathy Cycle 29 FOLFIRINOX 08/05/2024, oxaliplatin  held secondary to neuropathy Cycle 30 FOLFIRINOX 08/19/2024, oxaliplatin  held secondary to neuropathy Cycle 31 FOLFIRINOX 09/02/2024, oxaliplatin  held secondary to neuropathy Cycle 32 FOLFIRINOX 09/23/2024, oxaliplatin  held secondary to neuropathy Cycle 33 FOLFIRINOX 10/14/2024, oxaliplatin  held secondary to neuropathy CTs at [facility] 10/22/2024: Unchanged hepatic metastatic disease, pancreas tail lesion not well-visualized, no evidence of disease progression Cycle 34 FOLFIRINOX 10/28/2024, oxaliplatin  held secondary to neuropathy Cycle 35 FOLFIRINOX 11/18/2024, oxaliplatin  held secondary to neuropathy Cycle 36 FOLFIRINOX 12/02/2024, 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  Resolved with Reglan .  Recurrent hiccups following cycle 4 FOLFIRINOX-not relieved with Reglan   Disposition: David [name] appears stable.  He continues FOLFIRI.  He is tolerating treatment well.  No clinical evidence of disease  progression.  CA 19-9 overall stable for the past 6 weeks.  We will follow-up on the value from today.  Plan to proceed with FOLFIRI today as scheduled.  CBC and chemistry panel reviewed.  Labs are adequate for treatment.  He will return for follow-up and the next cycle of FOLFIRI in 2 weeks.  We are available to see him sooner if needed.    David [name] ANP/GNP-BC   12/02/2024  10:02 AM

## 2024-12-02 NOTE — Patient Instructions (Signed)
 [facility] - A DEPT OF [facility]  Discharge Instructions: Thank you for choosing [facility] to provide your oncology and hematology care.   If you have a lab appointment with the Cancer Center, please go directly to the Cancer Center and check in at the registration area.   Wear comfortable clothing and clothing appropriate for easy access to any Portacath or PICC line.   We strive to give you quality time with your provider. You may need to reschedule your appointment if you arrive late (15 or more minutes).  Arriving late affects you and other patients whose appointments are after yours.  Also, if you miss three or more appointments without notifying the office, you may be dismissed from the clinic at the provider's discretion.      For prescription refill requests, have your pharmacy contact our office and allow 72 hours for refills to be completed.    Today you received the following chemotherapy and/or immunotherapy agents: leucovorin , irinotecan , fluorouracil        To help prevent nausea and vomiting after your treatment, we encourage you to take your nausea medication as directed.  BELOW ARE SYMPTOMS THAT SHOULD BE REPORTED IMMEDIATELY: *FEVER GREATER THAN 100.4 F (38 C) OR HIGHER *CHILLS OR SWEATING *NAUSEA AND VOMITING THAT IS NOT CONTROLLED WITH YOUR NAUSEA MEDICATION *UNUSUAL SHORTNESS OF BREATH *UNUSUAL BRUISING OR BLEEDING *URINARY PROBLEMS (pain or burning when urinating, or frequent urination) *BOWEL PROBLEMS (unusual diarrhea, constipation, pain near the anus) TENDERNESS IN MOUTH AND THROAT WITH OR WITHOUT PRESENCE OF ULCERS (sore throat, sores in mouth, or a toothache) UNUSUAL RASH, SWELLING OR PAIN  UNUSUAL VAGINAL DISCHARGE OR ITCHING   Items with * indicate a potential emergency and should be followed up as soon as possible or go to the Emergency Department if any problems should occur.  Please show the CHEMOTHERAPY  ALERT CARD or IMMUNOTHERAPY ALERT CARD at check-in to the Emergency Department and triage nurse.  Should you have questions after your visit or need to cancel or reschedule your appointment, please contact [facility] - A DEPT OF [facility]  Dept: [phone]  and follow the prompts.  Office hours are 8:00 a.m. to 4:30 p.m. Monday - Friday. Please note that voicemails left after 4:00 p.m. may not be returned until the following business day.  We are closed weekends and major holidays. You have access to a nurse at all times for urgent questions. Please call the main number to the clinic Dept: [phone] and follow the prompts.   For any non-urgent questions, you may also contact your provider using MyChart. We now offer e-Visits for anyone 29 and older to request care online for non-urgent symptoms. For details visit [email or web address].   Also download the MyChart app! Go to the app store, search MyChart, open the app, select [place], and log in with your MyChart username and password.

## 2024-12-02 NOTE — Patient Instructions (Signed)

## 2024-12-02 NOTE — Progress Notes (Signed)
 Patient seen by Olam Ned NP today  Vitals are within treatment parameters:Yes   Labs are within treatment parameters: Yes   Treatment plan has been signed: Yes   Per physician team, Patient is ready for treatment and there are NO modifications to the treatment plan.

## 2024-12-03 LAB — CANCER ANTIGEN 19-9: CA 19-9: 89 U/mL — ABNORMAL HIGH (ref 0–35)

## 2024-12-04 ENCOUNTER — Inpatient Hospital Stay

## 2024-12-04 ENCOUNTER — Other Ambulatory Visit: Payer: Self-pay

## 2024-12-04 VITALS — BP 139/80 | HR 82 | Temp 98.6°F | Resp 16

## 2024-12-04 DIAGNOSIS — C259 Malignant neoplasm of pancreas, unspecified: Secondary | ICD-10-CM

## 2024-12-04 DIAGNOSIS — Z5111 Encounter for antineoplastic chemotherapy: Secondary | ICD-10-CM | POA: Diagnosis not present

## 2024-12-04 MED ORDER — PEGFILGRASTIM-JMDB 6 MG/0.6ML ~~LOC~~ SOSY
6.0000 mg | PREFILLED_SYRINGE | Freq: Once | SUBCUTANEOUS | Status: AC
  Administered 2024-12-04: 6 mg via SUBCUTANEOUS
  Filled 2024-12-04: qty 0.6

## 2024-12-04 NOTE — Patient Instructions (Signed)

## 2024-12-13 ENCOUNTER — Other Ambulatory Visit: Payer: Self-pay | Admitting: Oncology

## 2024-12-15 MED ORDER — SODIUM CHLORIDE 0.9 % IV SOLN
2400.0000 mg/m2 | INTRAVENOUS | Status: DC
  Administered 2024-12-16: 4450 mg via INTRAVENOUS
  Filled 2024-12-15: qty 89

## 2024-12-16 ENCOUNTER — Inpatient Hospital Stay

## 2024-12-16 ENCOUNTER — Inpatient Hospital Stay (HOSPITAL_BASED_OUTPATIENT_CLINIC_OR_DEPARTMENT_OTHER): Admitting: Oncology

## 2024-12-16 VITALS — BP 142/81 | HR 80 | Resp 17

## 2024-12-16 VITALS — BP 144/87 | HR 74 | Temp 98.0°F | Resp 16 | Ht 68.0 in | Wt 167.0 lb

## 2024-12-16 DIAGNOSIS — C259 Malignant neoplasm of pancreas, unspecified: Secondary | ICD-10-CM

## 2024-12-16 DIAGNOSIS — C787 Secondary malignant neoplasm of liver and intrahepatic bile duct: Secondary | ICD-10-CM | POA: Diagnosis not present

## 2024-12-16 DIAGNOSIS — Z5111 Encounter for antineoplastic chemotherapy: Secondary | ICD-10-CM | POA: Diagnosis not present

## 2024-12-16 LAB — CMP (CANCER CENTER ONLY)
ALT: 29 U/L (ref 0–44)
AST: 21 U/L (ref 15–41)
Albumin: 4.3 g/dL (ref 3.5–5.0)
Alkaline Phosphatase: 218 U/L — ABNORMAL HIGH (ref 38–126)
Anion gap: 12 (ref 5–15)
BUN: 20 mg/dL (ref 6–20)
CO2: 25 mmol/L (ref 22–32)
Calcium: 9.7 mg/dL (ref 8.9–10.3)
Chloride: 102 mmol/L (ref 98–111)
Creatinine: 0.88 mg/dL (ref 0.61–1.24)
GFR, Estimated: >60 mL/min
Glucose, Bld: 139 mg/dL — ABNORMAL HIGH (ref 70–99)
Potassium: 4.2 mmol/L (ref 3.5–5.1)
Sodium: 138 mmol/L (ref 135–145)
Total Bilirubin: 0.5 mg/dL (ref 0.0–1.2)
Total Protein: 6.8 g/dL (ref 6.5–8.1)

## 2024-12-16 LAB — CBC WITH DIFFERENTIAL (CANCER CENTER ONLY)
Abs Immature Granulocytes: 0.12 K/uL — ABNORMAL HIGH (ref 0.00–0.07)
Basophils Absolute: 0 K/uL (ref 0.0–0.1)
Basophils Relative: 1 %
Eosinophils Absolute: 0.1 K/uL (ref 0.0–0.5)
Eosinophils Relative: 2 %
HCT: 33.7 % — ABNORMAL LOW (ref 39.0–52.0)
Hemoglobin: 11 g/dL — ABNORMAL LOW (ref 13.0–17.0)
Immature Granulocytes: 2 %
Lymphocytes Relative: 16 %
Lymphs Abs: 0.9 K/uL (ref 0.7–4.0)
MCH: 31.4 pg (ref 26.0–34.0)
MCHC: 32.6 g/dL (ref 30.0–36.0)
MCV: 96.3 fL (ref 80.0–100.0)
Monocytes Absolute: 0.6 K/uL (ref 0.1–1.0)
Monocytes Relative: 10 %
Neutro Abs: 4.1 K/uL (ref 1.7–7.7)
Neutrophils Relative %: 69 %
Platelet Count: 153 K/uL (ref 150–400)
RBC: 3.5 MIL/uL — ABNORMAL LOW (ref 4.22–5.81)
RDW: 13.8 % (ref 11.5–15.5)
WBC Count: 5.9 K/uL (ref 4.0–10.5)
nRBC: 0 % (ref 0.0–0.2)

## 2024-12-16 MED ORDER — SODIUM CHLORIDE 0.9 % IV SOLN
150.0000 mg | Freq: Once | INTRAVENOUS | Status: AC
  Administered 2024-12-16: 150 mg via INTRAVENOUS
  Filled 2024-12-16: qty 150

## 2024-12-16 MED ORDER — ATROPINE SULFATE 1 MG/ML IV SOLN
0.5000 mg | Freq: Once | INTRAVENOUS | Status: AC | PRN
  Administered 2024-12-16: 0.5 mg via INTRAVENOUS
  Filled 2024-12-16: qty 1

## 2024-12-16 MED ORDER — DEXAMETHASONE SODIUM PHOSPHATE 10 MG/ML IJ SOLN
5.0000 mg | Freq: Once | INTRAMUSCULAR | Status: AC
  Administered 2024-12-16: 5 mg via INTRAVENOUS

## 2024-12-16 MED ORDER — SODIUM CHLORIDE 0.9 % IV SOLN
400.0000 mg/m2 | Freq: Once | INTRAVENOUS | Status: AC
  Administered 2024-12-16: 744 mg via INTRAVENOUS
  Filled 2024-12-16: qty 37.2

## 2024-12-16 MED ORDER — SODIUM CHLORIDE 0.9 % IV SOLN: INTRAVENOUS | Status: DC

## 2024-12-16 MED ORDER — SODIUM CHLORIDE 0.9 % IV SOLN
150.0000 mg/m2 | Freq: Once | INTRAVENOUS | Status: AC
  Administered 2024-12-16: 300 mg via INTRAVENOUS
  Filled 2024-12-16: qty 15

## 2024-12-16 MED ORDER — PALONOSETRON HCL INJECTION 0.25 MG/5ML
0.2500 mg | Freq: Once | INTRAVENOUS | Status: AC
  Administered 2024-12-16: 0.25 mg via INTRAVENOUS
  Filled 2024-12-16: qty 5

## 2024-12-16 NOTE — Patient Instructions (Signed)

## 2024-12-16 NOTE — Progress Notes (Signed)
 " David [name] OFFICE PROGRESS NOTE   Diagnosis: Pancreas cancer  INTERVAL HISTORY:   David [name] completed another cycle of FOLFIRI on 12/02/2024.  He reports mild nausea following chemotherapy.  No hiccups.  He has mild remaining neuropathy symptoms in the hands and feet.  Good appetite.  No pain.  Objective:  Vital signs in last 24 hours:  Blood pressure (!) 144/87, pulse 74, temperature 98 F (36.7 C), temperature source Oral, resp. rate 16, height 5' 8 (1.727 m), weight 167 lb (75.8 kg).    HEENT: No thrush or ulcers Resp: Lungs clear bilaterally Cardio: Regular rate and rhythm GI: No hepatosplenomegaly, nontender, no mass Vascular: No leg edema  Skin: Palms without erythema  Portacath/PICC-without erythema  Lab Results:  Lab Results  Component Value Date   WBC 5.9 12/16/2024   HGB 11.0 (L) 12/16/2024   HCT 33.7 (L) 12/16/2024   MCV 96.3 12/16/2024   PLT 153 12/16/2024   NEUTROABS 4.1 12/16/2024    CMP  Lab Results  Component Value Date   NA 138 12/16/2024   K 4.2 12/16/2024   CL 102 12/16/2024   CO2 25 12/16/2024   GLUCOSE 139 (H) 12/16/2024   BUN 20 12/16/2024   CREATININE 0.88 12/16/2024   CALCIUM  9.7 12/16/2024   PROT 6.8 12/16/2024   ALBUMIN 4.3 12/16/2024   AST 21 12/16/2024   ALT 29 12/16/2024   ALKPHOS 218 (H) 12/16/2024   BILITOT 0.5 12/16/2024   GFRNONAA >60 12/16/2024   GFRAA >60 03/26/2018    Lab Results  Component Value Date   CEA 772.45 (H) 05/02/2023   CAN199 89 (H) 12/02/2024   Medications: I have reviewed the patient's current medications.   Assessment/Plan: Pancreas cancer CT angiogram chest 04/24/2023-low-density mass in the dome of the liver with at least 3 additional lesions in both hepatic lobes, borderline lymphadenopathy in the hepatoduodenal ligament Right upper quadrant ultrasound 04/24/2023-large circumscribed mass of the liver dome CT abdomen/pelvis 04/24/2023-multiple liver lesions poorly defined on  noncontrast exam, prominent portacaval and porta hepatis nodes MRI abdomen 05/05/2023-multiple rim-hypoenhancing liver lesions consistent with hepatic metastases, suspicion for a pancreas tail mass Ultrasound-guided biopsy of dominant right liver lesion 05/10/2023-adenocarcinoma, CK7 and CDX2 positive, abundant cytoplasm and mucin with extracellular mucin, foci of lymphovascular invasion Tempus gene panel-K-ras G12V, T P53 mild tumor mutation burden 3.2, MSS PET 06/04/2023-multiple hypermetabolic liver lesions consistent with metastases, low-level FDG uptake in the soft tissue fullness at the tip of the pancreas tail, low-level FDG activity involving a small soft tissue nodule between the gastric fundus and spleen, tiny foci of accumulation at the right costovertebral junction at T10 and roof of the left acetabulum without an underlying CT lesion Elevated CEA and CA 19-9 EUS 06/07/2023 ,22x 15 mm irregular mass in the tail the pancreas, 11 mm subcarinal lymph node, FNA biopsy of the pancreas mass-suspicious for malignancy Cycle 1 FOLFIRINOX 06/19/2023 Cycle 2 FOLFIRINOX 07/03/2023 Cycle 3 FOLFIRINOX 07/17/2023 Cycle 4 FOLFIRINOX 07/31/2023 Cycle 5 FOLFIRINOX 08/14/2023, oxaliplatin  and Decadron  dose reduced Cycle 6 FOLFIRINOX 08/28/2023 Cycle 7 FOLFIRINOX 09/11/2023 CTs at [facility] 09/16/2023, compared to 04/24/2023-unchanged pancreas tail mass, decrease in attenuation of hepatic metastases, several lesions have decreased in size, no new lesions Cycle 8 FOLFIRINOX 09/25/2023 Cycle 9 FOLFIRINOX 10/09/2023 Cycle 10 FOLFIRINOX 10/22/2023 Cycle 11 FOLFIRINOX 11/05/2023 Cycle 12 FOLFIRINOX 11/20/2023 Cycle 13 FOLFIRINOX 12/04/2023 Cycle 14 FOLFIRINOX 12/19/2023 CTs at [facility] 12/23/2023: Stable to slight decrease in hepatic metastases, no new lesions, stable pancreas tail mass MRI liver  at [name] 12/26/2023: Multifocal nonenhancing hepatic metastases, pancreas tail mass not well-visualized Cycle 15 FOLFIRINOX 01/01/2024,  oxaliplatin  held secondary to neuropathy Cycle 16 FOLFIRINOX 01/15/2024, oxaliplatin  held secondary to neuropathy Cycle 17 FOLFIRINOX 02/05/2024, oxaliplatin  held secondary to neuropathy Cycle 18 FOLFIRINOX 02/19/2024, oxaliplatin  held secondary to neuropathy Cycle 19 FOLFIRINOX 03/04/2024, oxaliplatin  held secondary to neuropathy Cycle 20 FOLFIRINOX 03/18/2024, oxaliplatin  held secondary to neuropathy CTs at [facility] 03/27/2024: Stable hepatic metastases, no new hepatic lesion, pancreas tail lesion not well-visualized unchanged right lower lobe groundglass opacity Cycle 21 FOLFIRINOX 04/01/2024, oxaliplatin  held secondary to neuropathy Cycle 22 FOLFIRINOX 04/15/2024, oxaliplatin  held secondary to neuropathy Cycle 23 FOLFIRINOX 04/29/2024, oxaliplatin  held secondary to neuropathy Cycle 24 FOLFIRINOX 05/20/2024, oxaliplatin  held secondary to neuropathy Cycle 25 FOLFIRINOX 06/11/2024, oxaliplatin  held secondary to neuropathy Cycle 26 FOLFIRINOX 06/25/2024, oxaliplatin  held secondary to neuropathy Cycle 27 FOLFIRINOX 07/08/2024, oxaliplatin  held secondary to neuropathy CTs at [name] 07/14/2024: Stable treated hepatic metastatic disease, pancreas tail lesion not visualized Cycle 28 FOLFIRINOX 07/22/2024, oxaliplatin  held secondary to neuropathy Cycle 29 FOLFIRINOX 08/05/2024, oxaliplatin  held secondary to neuropathy Cycle 30 FOLFIRINOX 08/19/2024, oxaliplatin  held secondary to neuropathy Cycle 31 FOLFIRINOX 09/02/2024, oxaliplatin  held secondary to neuropathy Cycle 32 FOLFIRINOX 09/23/2024, oxaliplatin  held secondary to neuropathy Cycle 33 FOLFIRINOX 10/14/2024, oxaliplatin  held secondary to neuropathy CTs at [facility] 10/22/2024: Unchanged hepatic metastatic disease, pancreas tail lesion not well-visualized, no evidence of disease progression Cycle 34 FOLFIRINOX 10/28/2024, oxaliplatin  held secondary to neuropathy Cycle 35 FOLFIRINOX 11/18/2024, oxaliplatin  held secondary to neuropathy Cycle 36 FOLFIRINOX 12/02/2024, oxaliplatin   held secondary to neuropathy Cycle 37 FOLFIRINOX 12/16/2024, 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  Resolved with Reglan .  Recurrent hiccups following cycle 4 FOLFIRINOX-not relieved with Reglan     Disposition: David [name] appears stable.  He continues to tolerate the chemotherapy well.  The CA 19-9 Schneid has been slightly higher over the past month.  We will follow-up on the CA 19-9 from today.  He is scheduled for restaging CTs and follow-up at [facility] on 01/09/2024.  The plan is to continue FOLFIRI.  Neuropathy symptoms appear significantly improved.  We can consider adding oxaliplatin  back to the chemotherapy regimen depending on the restaging evaluation.    David Hof, MD  12/16/2024  9:16 AM   "

## 2024-12-16 NOTE — Patient Instructions (Addendum)
 [facility] - A DEPT OF [place]  Discharge Instructions: Thank you for choosing [facility] to provide your oncology and hematology care.   If you have a lab appointment with the Cancer Center, please go directly to the Cancer Center and check in at the registration area.   Wear comfortable clothing and clothing appropriate for easy access to any Portacath or PICC line.   We strive to give you quality time with your provider. You may need to reschedule your appointment if you arrive late (15 or more minutes).  Arriving late affects you and other patients whose appointments are after yours.  Also, if you miss three or more appointments without notifying the office, you may be dismissed from the clinic at the providers discretion.      For prescription refill requests, have your pharmacy contact our office and allow 72 hours for refills to be completed.    Today you received the following chemotherapy and/or immunotherapy agents: leucovorin , irinotecan , fluorouracil        To help prevent nausea and vomiting after your treatment, we encourage you to take your nausea medication as directed.  BELOW ARE SYMPTOMS THAT SHOULD BE REPORTED IMMEDIATELY: *FEVER GREATER THAN 100.4 F (38 C) OR HIGHER *CHILLS OR SWEATING *NAUSEA AND VOMITING THAT IS NOT CONTROLLED WITH YOUR NAUSEA MEDICATION *UNUSUAL SHORTNESS OF BREATH *UNUSUAL BRUISING OR BLEEDING *URINARY PROBLEMS (pain or burning when urinating, or frequent urination) *BOWEL PROBLEMS (unusual diarrhea, constipation, pain near the anus) TENDERNESS IN MOUTH AND THROAT WITH OR WITHOUT PRESENCE OF ULCERS (sore throat, sores in mouth, or a toothache) UNUSUAL RASH, SWELLING OR PAIN  UNUSUAL VAGINAL DISCHARGE OR ITCHING   Items with * indicate a potential emergency and should be followed up as soon as possible or go to the Emergency Department if any problems should occur.  Please show the CHEMOTHERAPY  ALERT CARD or IMMUNOTHERAPY ALERT CARD at check-in to the Emergency Department and triage nurse.  Should you have questions after your visit or need to cancel or reschedule your appointment, please contact [facility]  Dept: [phone]  and follow the prompts.  Office hours are 8:00 a.m. to 4:30 p.m. Monday - Friday. Please note that voicemails left after 4:00 p.m. may not be returned until the following business day.  We are closed weekends and major holidays. You have access to a nurse at all times for urgent questions. Please call the main number to the clinic Dept: [phone] and follow the prompts.   For any non-urgent questions, you may also contact your provider using MyChart. We now offer e-Visits for anyone 66 and older to request care online for non-urgent symptoms. For details visit [email or web address].   Also download the MyChart app! Go to the app store, search MyChart, open the app, select [place], and log in with your MyChart username and password.  The chemotherapy medication bag should finish at 46 hours, 96 hours, or 7 days. For example, if your pump is scheduled for 46 hours and it was put on at 4:00 p.m., it should finish at 2:00 p.m. the day it is scheduled to come off regardless of your appointment time.     Estimated time to finish at 11:30 AM on Friday 12/18/2024.   If the display on your pump reads Low Volume and it is beeping, take the batteries out of the pump and come to the cancer center  for it to be taken off.   If the pump alarms go off prior to the pump reading Low Volume then call [phone] and someone can assist you.  If the plunger comes out and the chemotherapy medication is leaking out, please use your home chemo spill kit to clean up the spill. Do NOT use paper towels or other household products.  If you have problems or questions regarding your pump, please call either  [phone] (24 hours a day) or the cancer center Monday-Friday 8:00 a.m.- 4:30 p.m. at the clinic number and we will assist you. If you are unable to get assistance, then go to the nearest Emergency Department and ask the staff to contact the IV team for assistance.

## 2024-12-16 NOTE — Progress Notes (Signed)
 Patient seen by Dr. Arley Hof today  Vitals are within treatment parameters:Yes   Labs are within treatment parameters: Yes   Treatment plan has been signed: Yes   Per physician team, Patient is ready for treatment and there are NO modifications to the treatment plan.

## 2024-12-17 LAB — CANCER ANTIGEN 19-9: CA 19-9: 89 U/mL — ABNORMAL HIGH (ref 0–35)

## 2024-12-18 ENCOUNTER — Inpatient Hospital Stay: Attending: Hematology

## 2024-12-18 VITALS — BP 128/87 | HR 79 | Temp 98.1°F | Resp 18

## 2024-12-18 DIAGNOSIS — Z5189 Encounter for other specified aftercare: Secondary | ICD-10-CM | POA: Insufficient documentation

## 2024-12-18 DIAGNOSIS — G629 Polyneuropathy, unspecified: Secondary | ICD-10-CM | POA: Diagnosis not present

## 2024-12-18 DIAGNOSIS — C252 Malignant neoplasm of tail of pancreas: Secondary | ICD-10-CM | POA: Insufficient documentation

## 2024-12-18 DIAGNOSIS — C787 Secondary malignant neoplasm of liver and intrahepatic bile duct: Secondary | ICD-10-CM | POA: Insufficient documentation

## 2024-12-18 DIAGNOSIS — Z800 Family history of malignant neoplasm of digestive organs: Secondary | ICD-10-CM | POA: Diagnosis not present

## 2024-12-18 DIAGNOSIS — C259 Malignant neoplasm of pancreas, unspecified: Secondary | ICD-10-CM

## 2024-12-18 DIAGNOSIS — Z5111 Encounter for antineoplastic chemotherapy: Secondary | ICD-10-CM | POA: Insufficient documentation

## 2024-12-18 MED ORDER — PEGFILGRASTIM-JMDB 6 MG/0.6ML ~~LOC~~ SOSY
6.0000 mg | PREFILLED_SYRINGE | Freq: Once | SUBCUTANEOUS | Status: AC
  Administered 2024-12-18: 6 mg via SUBCUTANEOUS
  Filled 2024-12-18: qty 0.6

## 2024-12-18 NOTE — Patient Instructions (Signed)

## 2024-12-20 ENCOUNTER — Other Ambulatory Visit: Payer: Self-pay

## 2024-12-26 ENCOUNTER — Other Ambulatory Visit: Payer: Self-pay | Admitting: Oncology

## 2024-12-26 DIAGNOSIS — C259 Malignant neoplasm of pancreas, unspecified: Secondary | ICD-10-CM

## 2024-12-30 ENCOUNTER — Inpatient Hospital Stay

## 2024-12-30 ENCOUNTER — Encounter: Payer: Self-pay | Admitting: Nurse Practitioner

## 2024-12-30 ENCOUNTER — Inpatient Hospital Stay (HOSPITAL_BASED_OUTPATIENT_CLINIC_OR_DEPARTMENT_OTHER): Admitting: Nurse Practitioner

## 2024-12-30 VITALS — BP 137/63 | HR 73 | Temp 98.1°F | Resp 18

## 2024-12-30 VITALS — BP 137/79 | HR 100 | Temp 97.9°F | Resp 18 | Ht 68.0 in | Wt 164.3 lb

## 2024-12-30 DIAGNOSIS — Z5111 Encounter for antineoplastic chemotherapy: Secondary | ICD-10-CM | POA: Diagnosis not present

## 2024-12-30 DIAGNOSIS — C259 Malignant neoplasm of pancreas, unspecified: Secondary | ICD-10-CM

## 2024-12-30 DIAGNOSIS — C787 Secondary malignant neoplasm of liver and intrahepatic bile duct: Secondary | ICD-10-CM

## 2024-12-30 LAB — CMP (CANCER CENTER ONLY)
ALT: 34 U/L (ref 0–44)
AST: 26 U/L (ref 15–41)
Albumin: 4.6 g/dL (ref 3.5–5.0)
Alkaline Phosphatase: 187 U/L — ABNORMAL HIGH (ref 38–126)
Anion gap: 13 (ref 5–15)
BUN: 16 mg/dL (ref 6–20)
CO2: 26 mmol/L (ref 22–32)
Calcium: 10.2 mg/dL (ref 8.9–10.3)
Chloride: 100 mmol/L (ref 98–111)
Creatinine: 0.84 mg/dL (ref 0.61–1.24)
GFR, Estimated: >60 mL/min
Glucose, Bld: 97 mg/dL (ref 70–99)
Potassium: 4 mmol/L (ref 3.5–5.1)
Sodium: 138 mmol/L (ref 135–145)
Total Bilirubin: 1 mg/dL (ref 0.0–1.2)
Total Protein: 7.4 g/dL (ref 6.5–8.1)

## 2024-12-30 LAB — CBC WITH DIFFERENTIAL (CANCER CENTER ONLY)
Abs Immature Granulocytes: 0.14 K/uL — ABNORMAL HIGH (ref 0.00–0.07)
Basophils Absolute: 0 K/uL (ref 0.0–0.1)
Basophils Relative: 1 %
Eosinophils Absolute: 0.1 K/uL (ref 0.0–0.5)
Eosinophils Relative: 2 %
HCT: 34.5 % — ABNORMAL LOW (ref 39.0–52.0)
Hemoglobin: 11.4 g/dL — ABNORMAL LOW (ref 13.0–17.0)
Immature Granulocytes: 3 %
Lymphocytes Relative: 15 %
Lymphs Abs: 0.7 K/uL (ref 0.7–4.0)
MCH: 31.5 pg (ref 26.0–34.0)
MCHC: 33 g/dL (ref 30.0–36.0)
MCV: 95.3 fL (ref 80.0–100.0)
Monocytes Absolute: 0.6 K/uL (ref 0.1–1.0)
Monocytes Relative: 11 %
Neutro Abs: 3.3 K/uL (ref 1.7–7.7)
Neutrophils Relative %: 68 %
Platelet Count: 179 K/uL (ref 150–400)
RBC: 3.62 MIL/uL — ABNORMAL LOW (ref 4.22–5.81)
RDW: 14.1 % (ref 11.5–15.5)
WBC Count: 4.8 K/uL (ref 4.0–10.5)
nRBC: 0 % (ref 0.0–0.2)

## 2024-12-30 MED ORDER — DEXAMETHASONE SODIUM PHOSPHATE 10 MG/ML IJ SOLN
5.0000 mg | Freq: Once | INTRAMUSCULAR | Status: AC
  Administered 2024-12-30: 5 mg via INTRAVENOUS

## 2024-12-30 MED ORDER — PALONOSETRON HCL INJECTION 0.25 MG/5ML
0.2500 mg | Freq: Once | INTRAVENOUS | Status: AC
  Administered 2024-12-30: 0.25 mg via INTRAVENOUS
  Filled 2024-12-30: qty 5

## 2024-12-30 MED ORDER — ONDANSETRON HCL 8 MG PO TABS: 8.0000 mg | ORAL_TABLET | Freq: Three times a day (TID) | ORAL | 3 refills | Status: AC | PRN

## 2024-12-30 MED ORDER — SODIUM CHLORIDE 0.9 % IV SOLN
400.0000 mg/m2 | Freq: Once | INTRAVENOUS | Status: AC
  Administered 2024-12-30: 744 mg via INTRAVENOUS
  Filled 2024-12-30: qty 37.2

## 2024-12-30 MED ORDER — SODIUM CHLORIDE 0.9 % IV SOLN: INTRAVENOUS | Status: DC

## 2024-12-30 MED ORDER — SODIUM CHLORIDE 0.9 % IV SOLN
2400.0000 mg/m2 | INTRAVENOUS | Status: DC
  Administered 2024-12-30: 4450 mg via INTRAVENOUS
  Filled 2024-12-30: qty 89

## 2024-12-30 MED ORDER — ATROPINE SULFATE 1 MG/ML IV SOLN
0.5000 mg | Freq: Once | INTRAVENOUS | Status: AC | PRN
  Administered 2024-12-30: 0.5 mg via INTRAVENOUS
  Filled 2024-12-30: qty 1

## 2024-12-30 MED ORDER — SODIUM CHLORIDE 0.9 % IV SOLN
150.0000 mg | Freq: Once | INTRAVENOUS | Status: AC
  Administered 2024-12-30: 150 mg via INTRAVENOUS
  Filled 2024-12-30: qty 150

## 2024-12-30 MED ORDER — SODIUM CHLORIDE 0.9 % IV SOLN
150.0000 mg/m2 | Freq: Once | INTRAVENOUS | Status: AC
  Administered 2024-12-30: 300 mg via INTRAVENOUS
  Filled 2024-12-30: qty 15

## 2024-12-30 NOTE — Patient Instructions (Signed)
 [facility] - A DEPT OF [facility]  Discharge Instructions: Thank you for choosing [facility] to provide your oncology and hematology care.   If you have a lab appointment with the Cancer Center, please go directly to the Cancer Center and check in at the registration area.   Wear comfortable clothing and clothing appropriate for easy access to any Portacath or PICC line.   We strive to give you quality time with your provider. You may need to reschedule your appointment if you arrive late (15 or more minutes).  Arriving late affects you and other patients whose appointments are after yours.  Also, if you miss three or more appointments without notifying the office, you may be dismissed from the clinic at the provider's discretion.      For prescription refill requests, have your pharmacy contact our office and allow 72 hours for refills to be completed.    Today you received the following chemotherapy and/or immunotherapy agents: leucovorin , irinotecan , fluorouracil        To help prevent nausea and vomiting after your treatment, we encourage you to take your nausea medication as directed.  BELOW ARE SYMPTOMS THAT SHOULD BE REPORTED IMMEDIATELY: *FEVER GREATER THAN 100.4 F (38 C) OR HIGHER *CHILLS OR SWEATING *NAUSEA AND VOMITING THAT IS NOT CONTROLLED WITH YOUR NAUSEA MEDICATION *UNUSUAL SHORTNESS OF BREATH *UNUSUAL BRUISING OR BLEEDING *URINARY PROBLEMS (pain or burning when urinating, or frequent urination) *BOWEL PROBLEMS (unusual diarrhea, constipation, pain near the anus) TENDERNESS IN MOUTH AND THROAT WITH OR WITHOUT PRESENCE OF ULCERS (sore throat, sores in mouth, or a toothache) UNUSUAL RASH, SWELLING OR PAIN  UNUSUAL VAGINAL DISCHARGE OR ITCHING   Items with * indicate a potential emergency and should be followed up as soon as possible or go to the Emergency Department if any problems should occur.  Please show the CHEMOTHERAPY  ALERT CARD or IMMUNOTHERAPY ALERT CARD at check-in to the Emergency Department and triage nurse.  Should you have questions after your visit or need to cancel or reschedule your appointment, please contact [facility] - A DEPT OF [facility]  Dept: [phone]  and follow the prompts.  Office hours are 8:00 a.m. to 4:30 p.m. Monday - Friday. Please note that voicemails left after 4:00 p.m. may not be returned until the following business day.  We are closed weekends and major holidays. You have access to a nurse at all times for urgent questions. Please call the main number to the clinic Dept: [phone] and follow the prompts.   For any non-urgent questions, you may also contact your provider using MyChart. We now offer e-Visits for anyone 29 and older to request care online for non-urgent symptoms. For details visit [email or web address].   Also download the MyChart app! Go to the app store, search MyChart, open the app, select [place], and log in with your MyChart username and password.

## 2024-12-30 NOTE — Progress Notes (Signed)
 " David [name]   Diagnosis: Pancreas cancer  INTERVAL HISTORY:   David [name] returns as scheduled.  He completed another cycle of FOLFIRI 12/16/2024.  He denies nausea/vomiting.  No mouth sores.  No diarrhea.  No hiccups.  Objective:  Vital signs in last 24 hours:  Blood pressure 137/79, pulse 100, temperature 97.9 F (36.6 C), temperature source Temporal, resp. rate 18, height 5' 8 (1.727 m), weight 164 lb 4.8 oz (74.5 kg), SpO2 98%.    HEENT: No thrush or ulcers. Resp: Lungs clear bilaterally. Cardio: Regular rate and rhythm. GI: No hepatosplenomegaly.  No apparent ascites.  Nontender. Vascular: No leg edema. Neuro: Alert and oriented. Skin: Palms without erythema. Port-A-Cath without erythema.  Lab Results:  Lab Results  Component Value Date   WBC 5.9 12/16/2024   HGB 11.0 (L) 12/16/2024   HCT 33.7 (L) 12/16/2024   MCV 96.3 12/16/2024   PLT 153 12/16/2024   NEUTROABS 4.1 12/16/2024    Imaging:  No results found.  Medications: I have reviewed the patient's current medications.  Assessment/Plan: Pancreas cancer CT angiogram chest 04/24/2023-low-density mass in the dome of the liver with at least 3 additional lesions in both hepatic lobes, borderline lymphadenopathy in the hepatoduodenal ligament Right upper quadrant ultrasound 04/24/2023-large circumscribed mass of the liver dome CT abdomen/pelvis 04/24/2023-multiple liver lesions poorly defined on noncontrast exam, prominent portacaval and porta hepatis nodes MRI abdomen 05/05/2023-multiple rim-hypoenhancing liver lesions consistent with hepatic metastases, suspicion for a pancreas tail mass Ultrasound-guided biopsy of dominant right liver lesion 05/10/2023-adenocarcinoma, CK7 and CDX2 positive, abundant cytoplasm and mucin with extracellular mucin, foci of lymphovascular invasion Tempus gene panel-K-ras G12V, T P53 mild tumor mutation burden 3.2, MSS PET 06/04/2023-multiple  hypermetabolic liver lesions consistent with metastases, low-level FDG uptake in the soft tissue fullness at the tip of the pancreas tail, low-level FDG activity involving a small soft tissue nodule between the gastric fundus and spleen, tiny foci of accumulation at the right costovertebral junction at T10 and roof of the left acetabulum without an underlying CT lesion Elevated CEA and CA 19-9 EUS 06/07/2023 ,22x 15 mm irregular mass in the tail the pancreas, 11 mm subcarinal lymph node, FNA biopsy of the pancreas mass-suspicious for malignancy Cycle 1 FOLFIRINOX 06/19/2023 Cycle 2 FOLFIRINOX 07/03/2023 Cycle 3 FOLFIRINOX 07/17/2023 Cycle 4 FOLFIRINOX 07/31/2023 Cycle 5 FOLFIRINOX 08/14/2023, oxaliplatin  and Decadron  dose reduced Cycle 6 FOLFIRINOX 08/28/2023 Cycle 7 FOLFIRINOX 09/11/2023 CTs at [facility] 09/16/2023, compared to 04/24/2023-unchanged pancreas tail mass, decrease in attenuation of hepatic metastases, several lesions have decreased in size, no new lesions Cycle 8 FOLFIRINOX 09/25/2023 Cycle 9 FOLFIRINOX 10/09/2023 Cycle 10 FOLFIRINOX 10/22/2023 Cycle 11 FOLFIRINOX 11/05/2023 Cycle 12 FOLFIRINOX 11/20/2023 Cycle 13 FOLFIRINOX 12/04/2023 Cycle 14 FOLFIRINOX 12/19/2023 CTs at [facility] 12/23/2023: Stable to slight decrease in hepatic metastases, no new lesions, stable pancreas tail mass MRI liver at [facility] 12/26/2023: Multifocal nonenhancing hepatic metastases, pancreas tail mass not well-visualized Cycle 15 FOLFIRINOX 01/01/2024, 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 Cycle 16 FOLFIRINOX 01/15/2024, 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 Cycle 17 FOLFIRINOX 02/05/2024, 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 Cycle 18 FOLFIRINOX 02/19/2024, 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 Cycle 19 FOLFIRINOX 03/04/2024, 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 Cycle 20 FOLFIRINOX 03/18/2024, 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 CTs at [facility] 03/27/2024: Stable hepatic metastases, no new hepatic lesion, pancreas tail  lesion not well-visualized unchanged right lower lobe groundglass opacity Cycle 21 FOLFIRINOX 04/01/2024, 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 Cycle 22 FOLFIRINOX 04/15/2024, 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 Cycle 23  FOLFIRINOX 04/29/2024, 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 Cycle 24 FOLFIRINOX 05/20/2024, 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 Cycle 25 FOLFIRINOX 06/11/2024, 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 Cycle 26 FOLFIRINOX 06/25/2024, 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 Cycle 27 FOLFIRINOX 07/08/2024, 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 CTs at [facility] 07/14/2024: Stable treated hepatic metastatic disease, pancreas tail lesion not visualized Cycle 28 FOLFIRINOX 07/22/2024, 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 Cycle 29 FOLFIRINOX 08/05/2024, 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 Cycle 30 FOLFIRINOX 08/19/2024, 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 Cycle 31 FOLFIRINOX 09/02/2024, 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 Cycle 32 FOLFIRINOX 09/23/2024, 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 Cycle 33 FOLFIRINOX 10/14/2024, 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 CTs at [facility] 10/22/2024: Unchanged hepatic metastatic disease, pancreas tail lesion not well-visualized, no evidence of disease progression Cycle 34 FOLFIRINOX 10/28/2024, 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  Resolved with Reglan .  Recurrent hiccups following cycle 4 FOLFIRINOX-not relieved with Reglan       Disposition: David [name] appears stable.  He continues  FOLFIRI every 2 weeks.  He is tolerating treatment well.  There is no clinical evidence of disease progression.  Plan to proceed with cycle 38 today as scheduled.  He has an upcoming appointment for restaging CTs and follow-up with Dr. Wendell.  CBC reviewed.  Counts are adequate for treatment.  Chemistry panel is pending.  David [name] will return for follow-up and treatment in 2 weeks.  We are available to see him sooner if needed.    David [name] ANP/GNP-BC   12/30/2024  10:10 AM        "

## 2024-12-30 NOTE — Patient Instructions (Signed)

## 2024-12-31 LAB — CANCER ANTIGEN 19-9: CA 19-9: 102 U/mL — ABNORMAL HIGH (ref 0–35)

## 2025-01-01 ENCOUNTER — Inpatient Hospital Stay

## 2025-01-01 VITALS — BP 137/70 | HR 89 | Temp 98.7°F | Resp 18

## 2025-01-01 DIAGNOSIS — Z5111 Encounter for antineoplastic chemotherapy: Secondary | ICD-10-CM | POA: Diagnosis not present

## 2025-01-01 DIAGNOSIS — C787 Secondary malignant neoplasm of liver and intrahepatic bile duct: Secondary | ICD-10-CM

## 2025-01-01 MED ORDER — PEGFILGRASTIM-JMDB 6 MG/0.6ML ~~LOC~~ SOSY
6.0000 mg | PREFILLED_SYRINGE | Freq: Once | SUBCUTANEOUS | Status: AC
  Administered 2025-01-01: 6 mg via SUBCUTANEOUS
  Filled 2025-01-01: qty 0.6

## 2025-01-01 NOTE — Patient Instructions (Signed)

## 2025-01-02 ENCOUNTER — Other Ambulatory Visit: Payer: Self-pay

## 2025-01-10 ENCOUNTER — Other Ambulatory Visit: Payer: Self-pay | Admitting: Oncology

## 2025-01-10 DIAGNOSIS — C259 Malignant neoplasm of pancreas, unspecified: Secondary | ICD-10-CM

## 2025-01-13 ENCOUNTER — Inpatient Hospital Stay

## 2025-01-13 ENCOUNTER — Inpatient Hospital Stay (HOSPITAL_BASED_OUTPATIENT_CLINIC_OR_DEPARTMENT_OTHER): Admitting: Oncology

## 2025-01-13 VITALS — BP 126/82 | HR 88 | Temp 97.7°F | Resp 18 | Ht 68.0 in | Wt 163.6 lb

## 2025-01-13 DIAGNOSIS — C259 Malignant neoplasm of pancreas, unspecified: Secondary | ICD-10-CM

## 2025-01-13 DIAGNOSIS — Z5111 Encounter for antineoplastic chemotherapy: Secondary | ICD-10-CM | POA: Diagnosis not present

## 2025-01-13 DIAGNOSIS — C787 Secondary malignant neoplasm of liver and intrahepatic bile duct: Secondary | ICD-10-CM | POA: Diagnosis not present

## 2025-01-13 LAB — CBC WITH DIFFERENTIAL (CANCER CENTER ONLY)
Abs Immature Granulocytes: 0.12 10*3/uL — ABNORMAL HIGH (ref 0.00–0.07)
Basophils Absolute: 0 10*3/uL (ref 0.0–0.1)
Basophils Relative: 1 %
Eosinophils Absolute: 0.1 10*3/uL (ref 0.0–0.5)
Eosinophils Relative: 1 %
HCT: 35.3 % — ABNORMAL LOW (ref 39.0–52.0)
Hemoglobin: 11.5 g/dL — ABNORMAL LOW (ref 13.0–17.0)
Immature Granulocytes: 2 %
Lymphocytes Relative: 12 %
Lymphs Abs: 0.8 10*3/uL (ref 0.7–4.0)
MCH: 31.7 pg (ref 26.0–34.0)
MCHC: 32.6 g/dL (ref 30.0–36.0)
MCV: 97.2 fL (ref 80.0–100.0)
Monocytes Absolute: 0.6 10*3/uL (ref 0.1–1.0)
Monocytes Relative: 9 %
Neutro Abs: 5.1 10*3/uL (ref 1.7–7.7)
Neutrophils Relative %: 75 %
Platelet Count: 158 10*3/uL (ref 150–400)
RBC: 3.63 MIL/uL — ABNORMAL LOW (ref 4.22–5.81)
RDW: 14.6 % (ref 11.5–15.5)
WBC Count: 6.7 10*3/uL (ref 4.0–10.5)
nRBC: 0 % (ref 0.0–0.2)

## 2025-01-13 LAB — CMP (CANCER CENTER ONLY)
ALT: 29 U/L (ref 0–44)
AST: 25 U/L (ref 15–41)
Albumin: 4.5 g/dL (ref 3.5–5.0)
Alkaline Phosphatase: 204 U/L — ABNORMAL HIGH (ref 38–126)
Anion gap: 13 (ref 5–15)
BUN: 18 mg/dL (ref 6–20)
CO2: 25 mmol/L (ref 22–32)
Calcium: 10.1 mg/dL (ref 8.9–10.3)
Chloride: 102 mmol/L (ref 98–111)
Creatinine: 0.88 mg/dL (ref 0.61–1.24)
GFR, Estimated: >60 mL/min
Glucose, Bld: 115 mg/dL — ABNORMAL HIGH (ref 70–99)
Potassium: 4.3 mmol/L (ref 3.5–5.1)
Sodium: 140 mmol/L (ref 135–145)
Total Bilirubin: 0.6 mg/dL (ref 0.0–1.2)
Total Protein: 7.4 g/dL (ref 6.5–8.1)

## 2025-01-13 MED ORDER — SODIUM CHLORIDE 0.9 % IV SOLN
150.0000 mg/m2 | Freq: Once | INTRAVENOUS | Status: AC
  Administered 2025-01-13: 300 mg via INTRAVENOUS
  Filled 2025-01-13: qty 15

## 2025-01-13 MED ORDER — SODIUM CHLORIDE 0.9 % IV SOLN: INTRAVENOUS | Status: DC

## 2025-01-13 MED ORDER — PALONOSETRON HCL INJECTION 0.25 MG/5ML
0.2500 mg | Freq: Once | INTRAVENOUS | Status: AC
  Administered 2025-01-13: 0.25 mg via INTRAVENOUS
  Filled 2025-01-13: qty 5

## 2025-01-13 MED ORDER — ATROPINE SULFATE 1 MG/ML IV SOLN
0.5000 mg | Freq: Once | INTRAVENOUS | Status: AC | PRN
  Administered 2025-01-13: 0.5 mg via INTRAVENOUS
  Filled 2025-01-13: qty 1

## 2025-01-13 MED ORDER — SODIUM CHLORIDE 0.9 % IV SOLN
150.0000 mg | Freq: Once | INTRAVENOUS | Status: AC
  Administered 2025-01-13: 150 mg via INTRAVENOUS
  Filled 2025-01-13: qty 150

## 2025-01-13 MED ORDER — SODIUM CHLORIDE 0.9 % IV SOLN
400.0000 mg/m2 | Freq: Once | INTRAVENOUS | Status: AC
  Administered 2025-01-13: 744 mg via INTRAVENOUS
  Filled 2025-01-13: qty 37.2

## 2025-01-13 MED ORDER — SODIUM CHLORIDE 0.9 % IV SOLN
2400.0000 mg/m2 | INTRAVENOUS | Status: DC
  Administered 2025-01-13: 4450 mg via INTRAVENOUS
  Filled 2025-01-13: qty 89

## 2025-01-13 MED ORDER — DEXAMETHASONE SODIUM PHOSPHATE 10 MG/ML IJ SOLN
5.0000 mg | Freq: Once | INTRAMUSCULAR | Status: AC
  Administered 2025-01-13: 5 mg via INTRAVENOUS
  Filled 2025-01-13: qty 1

## 2025-01-13 NOTE — Patient Instructions (Signed)

## 2025-01-13 NOTE — Patient Instructions (Signed)
 [facility] - A DEPT OF [place]  Discharge Instructions: Thank you for choosing [facility] to provide your oncology and hematology care.   If you have a lab appointment with the Cancer Center, please go directly to the Cancer Center and check in at the registration area.   Wear comfortable clothing and clothing appropriate for easy access to any Portacath or PICC line.   We strive to give you quality time with your provider. You may need to reschedule your appointment if you arrive late (15 or more minutes).  Arriving late affects you and other patients whose appointments are after yours.  Also, if you miss three or more appointments without notifying the office, you may be dismissed from the clinic at the providers discretion.      For prescription refill requests, have your pharmacy contact our office and allow 72 hours for refills to be completed.    Today you received the following chemotherapy and/or immunotherapy agents: Irinotecan  (Camptosar ), Leucovorin , Fluorouracil  (Adrucil , 5-FU)      To help prevent nausea and vomiting after your treatment, we encourage you to take your nausea medication as directed.  BELOW ARE SYMPTOMS THAT SHOULD BE REPORTED IMMEDIATELY: *FEVER GREATER THAN 100.4 F (38 C) OR HIGHER *CHILLS OR SWEATING *NAUSEA AND VOMITING THAT IS NOT CONTROLLED WITH YOUR NAUSEA MEDICATION *UNUSUAL SHORTNESS OF BREATH *UNUSUAL BRUISING OR BLEEDING *URINARY PROBLEMS (pain or burning when urinating, or frequent urination) *BOWEL PROBLEMS (unusual diarrhea, constipation, pain near the anus) TENDERNESS IN MOUTH AND THROAT WITH OR WITHOUT PRESENCE OF ULCERS (sore throat, sores in mouth, or a toothache) UNUSUAL RASH, SWELLING OR PAIN  UNUSUAL VAGINAL DISCHARGE OR ITCHING   Items with * indicate a potential emergency and should be followed up as soon as possible or go to the Emergency Department if any problems should  occur.  Please show the CHEMOTHERAPY ALERT CARD or IMMUNOTHERAPY ALERT CARD at check-in to the Emergency Department and triage nurse.  Should you have questions after your visit or need to cancel or reschedule your appointment, please contact [facility]  Dept: [phone]  and follow the prompts.  Office hours are 8:00 a.m. to 4:30 p.m. Monday - Friday. Please note that voicemails left after 4:00 p.m. may not be returned until the following business day.  We are closed weekends and major holidays. You have access to a nurse at all times for urgent questions. Please call the main number to the clinic Dept: [phone] and follow the prompts.   For any non-urgent questions, you may also contact your provider using MyChart. We now offer e-Visits for anyone 4 and older to request care online for non-urgent symptoms. For details visit [email or web address].   Also download the MyChart app! Go to the app store, search MyChart, open the app, select , and log in with your MyChart username and password.

## 2025-01-13 NOTE — Progress Notes (Signed)
 Patient seen by Dr. Arley Hof today  Vitals are within treatment parameters:Yes   Labs are within treatment parameters: Yes   Treatment plan has been signed: Yes   Per physician team, Patient is ready for treatment and there are NO modifications to the treatment plan.

## 2025-01-13 NOTE — Progress Notes (Signed)
 " [facility] OFFICE PROGRESS NOTE   Diagnosis: Pancreas cancer  INTERVAL HISTORY:   Mr. Thelin completed another cycle FOLFIRI on 12/30/2024.  No nausea/vomiting or diarrhea.  He did not have hiccups following this cycle of chemotherapy.  He reports persistent mild peripheral numbness.  No abdominal pain. He underwent restaging CTs at [facility] on 01/08/2025.  The hepatic dome lesion is stable in size with an increase in the central enhancing nodular soft tissue.  Unchanged right lower lobe ground glass opacity.  No evidence of new metastatic disease.  No discrete pancreas lesion.  Dr. Wendell recommends Y90 treatment of the right liver lesion.  He recommends continuing FOLFIRI.  Mr. Oberman is scheduled for interventional radiology consultation on 01/27/2025.  Objective:  Vital signs in last 24 hours:  Blood pressure 126/82, pulse 88, temperature 97.7 F (36.5 C), temperature source Temporal, resp. rate 18, height 5' 8 (1.727 m), weight 163 lb 9.6 oz (74.2 kg), SpO2 100%.    HEENT: No thrush or ulcers, 1 mm ecchymosis at the left buccal mucosa Resp: Lungs clear bilaterally Cardio: Regular rate and rhythm GI: Nontender, no mass, no hepatosplenomegaly Vascular: No leg edema  Skin: Palms without erythema  Portacath/PICC-without erythema  Lab Results:  Lab Results  Component Value Date   WBC 6.7 01/13/2025   HGB 11.5 (L) 01/13/2025   HCT 35.3 (L) 01/13/2025   MCV 97.2 01/13/2025   PLT 158 01/13/2025   NEUTROABS 5.1 01/13/2025    CMP  Lab Results  Component Value Date   NA 140 01/13/2025   K 4.3 01/13/2025   CL 102 01/13/2025   CO2 25 01/13/2025   GLUCOSE 115 (H) 01/13/2025   BUN 18 01/13/2025   CREATININE 0.88 01/13/2025   CALCIUM  10.1 01/13/2025   PROT 7.4 01/13/2025   ALBUMIN 4.5 01/13/2025   AST 25 01/13/2025   ALT 29 01/13/2025   ALKPHOS 204 (H) 01/13/2025   BILITOT 0.6 01/13/2025   GFRNONAA >60 01/13/2025   GFRAA >60 03/26/2018    Lab Results   Component Value Date   CEA 772.45 (H) 05/02/2023   CAN199 102 (H) 12/30/2024    Medications: I have reviewed the patient's current medications.   Assessment/Plan: Pancreas cancer CT angiogram chest 04/24/2023-low-density mass in the dome of the liver with at least 3 additional lesions in both hepatic lobes, borderline lymphadenopathy in the hepatoduodenal ligament Right upper quadrant ultrasound 04/24/2023-large circumscribed mass of the liver dome CT abdomen/pelvis 04/24/2023-multiple liver lesions poorly defined on noncontrast exam, prominent portacaval and porta hepatis nodes MRI abdomen 05/05/2023-multiple rim-hypoenhancing liver lesions consistent with hepatic metastases, suspicion for a pancreas tail mass Ultrasound-guided biopsy of dominant right liver lesion 05/10/2023-adenocarcinoma, CK7 and CDX2 positive, abundant cytoplasm and mucin with extracellular mucin, foci of lymphovascular invasion Tempus gene panel-K-ras G12V, T P53 mild tumor mutation burden 3.2, MSS PET 06/04/2023-multiple hypermetabolic liver lesions consistent with metastases, low-level FDG uptake in the soft tissue fullness at the tip of the pancreas tail, low-level FDG activity involving a small soft tissue nodule between the gastric fundus and spleen, tiny foci of accumulation at the right costovertebral junction at T10 and roof of the left acetabulum without an underlying CT lesion Elevated CEA and CA 19-9 EUS 06/07/2023 ,22x 15 mm irregular mass in the tail the pancreas, 11 mm subcarinal lymph node, FNA biopsy of the pancreas mass-suspicious for malignancy Cycle 1 FOLFIRINOX 06/19/2023 Cycle 2 FOLFIRINOX 07/03/2023 Cycle 3 FOLFIRINOX 07/17/2023 Cycle 4 FOLFIRINOX 07/31/2023 Cycle 5 FOLFIRINOX 08/14/2023, oxaliplatin  and  Decadron  dose reduced Cycle 6 FOLFIRINOX 08/28/2023 Cycle 7 FOLFIRINOX 09/11/2023 CT[facility] at [facility] 09/16/2023, compared to 04/24/2023-unchanged pancreas tail mass, decrease in attenuation of hepatic metastases, several  lesions have decreased in size, no new lesions Cycle 8 FOLFIRINOX 09/25/2023 Cycle 9 FOLFIRINOX 10/09/2023 Cycle 10 FOLFIRINOX 10/22/2023 Cycle 11 FOLFIRINOX 11/05/2023 Cycle 12 FOLFIRINOX 11/20/2023 Cycle 13 FOLFIRINOX 12/04/2023 Cycle 14 FOLFIRINOX 12/19/2023 CTs at [facility] 12/23/2023: Stable to slight decrease in hepatic metastases, no new lesions, stable pancreas tail mass MRI liver at [facility] 12/26/2023: Multifocal nonenhancing hepatic metastases, pancreas tail mass not well-visualized Cycle 15 FOLFIRINOX 01/01/2024, 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 Cycle 16 FOLFIRINOX 01/15/2024, 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 Cycle 17 FOLFIRINOX 02/05/2024, 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 Cycle 18 FOLFIRINOX 02/19/2024, 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 Cycle 19 FOLFIRINOX 03/04/2024, 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 Cycle 20 FOLFIRINOX 03/18/2024, 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 CTs at [facility] 03/27/2024: Stable hepatic metastases, no new hepatic lesion, pancreas tail lesion not well-visualized unchanged right lower lobe groundglass opacity Cycle 21 FOLFIRINOX 04/01/2024, 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 Cycle 22 FOLFIRINOX 04/15/2024, 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 Cycle 23 FOLFIRINOX 04/29/2024, 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 Cycle 24 FOLFIRINOX 05/20/2024, 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 Cycle 25 FOLFIRINOX 06/11/2024, 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 Cycle 26 FOLFIRINOX 06/25/2024, 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 Cycle 27 FOLFIRINOX 07/08/2024, 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 CTs at [facility] 07/14/2024: Stable treated hepatic metastatic disease, pancreas tail lesion not visualized Cycle 28 FOLFIRINOX 07/22/2024, 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 Cycle 29 FOLFIRINOX 08/05/2024, 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 Cycle 30 FOLFIRINOX 08/19/2024, 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 Cycle 31 FOLFIRINOX 09/02/2024,  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 Cycle 32 FOLFIRINOX 09/23/2024, 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 Cycle 33 FOLFIRINOX 10/14/2024, 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 CTs at [facility] 10/22/2024: Unchanged hepatic metastatic disease, pancreas tail lesion not well-visualized, no evidence of disease progression Cycle 34 FOLFIRINOX 10/28/2024, 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 Cycle 35 FOLFIRINOX 11/18/2024, 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 Cycle 36 FOLFIRINOX 12/02/2024, 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 Cycle 37 FOLFIRINOX 12/16/2024, 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  Resolved with Reglan .  Recurrent hiccups following cycle 4 FOLFIRINOX-not relieved with Reglan         Disposition: Mr. Vazguez appears stable.  He continues to tolerate chemotherapy well.  The CA 19-9 has been slightly higher over the past few months.  A restaging CT evaluation at [facility] last week reveals no clear evidence of disease progression, though the central solid enhancing component of the right hepatic dome lesion has increased in size.  Dr. Wendell recommends treatment with radioembolization to the right liver lesion.  He discussed additional treatment options with Mr. Weigelt including recycled oxaliplatin  based therapy, gemcitabine/Abraxane, and clinical trials.  He was placed on the wait list for a K-ras inhibitor trial.  The plan is to continue FOLFIRI and proceed with Y90.   Mr. Morrical will complete another cycle of FOLFIRI today.  He will return for an office visit and chemotherapy in 2 weeks.  Arley Hof, MD  01/13/2025  11:10 AM   "

## 2025-01-14 LAB — CANCER ANTIGEN 19-9: CA 19-9: 121 U/mL — ABNORMAL HIGH (ref 0–35)

## 2025-01-15 ENCOUNTER — Inpatient Hospital Stay

## 2025-01-15 VITALS — BP 135/76 | HR 82 | Temp 98.2°F | Resp 18

## 2025-01-15 DIAGNOSIS — Z5111 Encounter for antineoplastic chemotherapy: Secondary | ICD-10-CM | POA: Diagnosis not present

## 2025-01-15 DIAGNOSIS — C259 Malignant neoplasm of pancreas, unspecified: Secondary | ICD-10-CM

## 2025-01-15 MED ORDER — PEGFILGRASTIM-JMDB 6 MG/0.6ML ~~LOC~~ SOSY
6.0000 mg | PREFILLED_SYRINGE | Freq: Once | SUBCUTANEOUS | Status: AC
  Administered 2025-01-15: 6 mg via SUBCUTANEOUS
  Filled 2025-01-15: qty 0.6

## 2025-01-15 NOTE — Patient Instructions (Signed)

## 2025-01-27 ENCOUNTER — Inpatient Hospital Stay

## 2025-01-27 ENCOUNTER — Inpatient Hospital Stay: Admitting: Nurse Practitioner

## 2025-01-28 ENCOUNTER — Inpatient Hospital Stay

## 2025-01-28 ENCOUNTER — Inpatient Hospital Stay: Admitting: Oncology

## 2025-01-29 ENCOUNTER — Inpatient Hospital Stay

## 2025-01-30 ENCOUNTER — Inpatient Hospital Stay: Attending: Hematology

## 2025-02-10 ENCOUNTER — Inpatient Hospital Stay: Admitting: Nurse Practitioner

## 2025-02-10 ENCOUNTER — Inpatient Hospital Stay

## 2025-02-12 ENCOUNTER — Inpatient Hospital Stay
